# Patient Record
Sex: Female | Born: 1975
Health system: Southern US, Community
[De-identification: ages and names within clinical notes are randomized; demographics above are authoritative.]

## PROBLEM LIST (undated history)

## (undated) DIAGNOSIS — E282 Polycystic ovarian syndrome: Secondary | ICD-10-CM

## (undated) DIAGNOSIS — F32A Depression, unspecified: Secondary | ICD-10-CM

## (undated) DIAGNOSIS — I10 Essential (primary) hypertension: Secondary | ICD-10-CM

## (undated) DIAGNOSIS — M1990 Unspecified osteoarthritis, unspecified site: Secondary | ICD-10-CM

## (undated) DIAGNOSIS — D259 Leiomyoma of uterus, unspecified: Secondary | ICD-10-CM

## (undated) DIAGNOSIS — F419 Anxiety disorder, unspecified: Secondary | ICD-10-CM

## (undated) DIAGNOSIS — F329 Major depressive disorder, single episode, unspecified: Secondary | ICD-10-CM

## (undated) DIAGNOSIS — Z5189 Encounter for other specified aftercare: Secondary | ICD-10-CM

## (undated) DIAGNOSIS — Z87442 Personal history of urinary calculi: Secondary | ICD-10-CM

## (undated) DIAGNOSIS — E785 Hyperlipidemia, unspecified: Secondary | ICD-10-CM

## (undated) DIAGNOSIS — E538 Deficiency of other specified B group vitamins: Secondary | ICD-10-CM

## (undated) DIAGNOSIS — D649 Anemia, unspecified: Secondary | ICD-10-CM

## (undated) HISTORY — DX: Deficiency of other specified B group vitamins: E53.8

## (undated) HISTORY — DX: Hyperlipidemia, unspecified: E78.5

## (undated) HISTORY — DX: Anxiety disorder, unspecified: F41.9

## (undated) HISTORY — DX: Polycystic ovarian syndrome: E28.2

## (undated) HISTORY — DX: Depression, unspecified: F32.A

## (undated) HISTORY — DX: Unspecified osteoarthritis, unspecified site: M19.90

## (undated) HISTORY — DX: Encounter for other specified aftercare: Z51.89

## (undated) HISTORY — DX: Essential (primary) hypertension: I10

## (undated) HISTORY — PX: OTHER SURGICAL HISTORY: SHX169

## (undated) HISTORY — PX: CHOLECYSTECTOMY: SHX55

---
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
## 1998-02-02 ENCOUNTER — Emergency Department (HOSPITAL_COMMUNITY): Admission: EM | Admit: 1998-02-02 | Discharge: 1998-02-02 | Payer: Self-pay | Admitting: Emergency Medicine

## 1998-09-19 ENCOUNTER — Other Ambulatory Visit: Admission: RE | Admit: 1998-09-19 | Discharge: 1998-09-19 | Payer: Self-pay | Admitting: Obstetrics and Gynecology

## 1998-09-28 ENCOUNTER — Emergency Department (HOSPITAL_COMMUNITY): Admission: EM | Admit: 1998-09-28 | Discharge: 1998-09-28 | Payer: Self-pay | Admitting: Emergency Medicine

## 2001-01-05 ENCOUNTER — Encounter: Payer: Self-pay | Admitting: Emergency Medicine

## 2001-01-05 ENCOUNTER — Emergency Department (HOSPITAL_COMMUNITY): Admission: EM | Admit: 2001-01-05 | Discharge: 2001-01-05 | Payer: Self-pay | Admitting: Emergency Medicine

## 2001-02-09 ENCOUNTER — Other Ambulatory Visit: Admission: RE | Admit: 2001-02-09 | Discharge: 2001-02-09 | Payer: Self-pay | Admitting: Obstetrics and Gynecology

## 2003-04-01 ENCOUNTER — Emergency Department (HOSPITAL_COMMUNITY): Admission: AD | Admit: 2003-04-01 | Discharge: 2003-04-01 | Payer: Self-pay | Admitting: Family Medicine

## 2003-06-15 ENCOUNTER — Other Ambulatory Visit: Admission: RE | Admit: 2003-06-15 | Discharge: 2003-06-15 | Payer: Self-pay | Admitting: Obstetrics and Gynecology

## 2004-02-08 ENCOUNTER — Ambulatory Visit: Payer: Self-pay | Admitting: Family Medicine

## 2004-11-19 ENCOUNTER — Ambulatory Visit: Payer: Self-pay | Admitting: Family Medicine

## 2004-12-03 ENCOUNTER — Ambulatory Visit: Payer: Self-pay | Admitting: Family Medicine

## 2004-12-17 ENCOUNTER — Ambulatory Visit: Payer: Self-pay | Admitting: Family Medicine

## 2005-01-24 ENCOUNTER — Other Ambulatory Visit: Admission: RE | Admit: 2005-01-24 | Discharge: 2005-01-24 | Payer: Self-pay | Admitting: Obstetrics and Gynecology

## 2005-10-14 ENCOUNTER — Emergency Department (HOSPITAL_COMMUNITY): Admission: EM | Admit: 2005-10-14 | Discharge: 2005-10-14 | Payer: Self-pay | Admitting: Emergency Medicine

## 2005-11-27 ENCOUNTER — Encounter: Admission: RE | Admit: 2005-11-27 | Discharge: 2005-11-27 | Payer: Self-pay | Admitting: Obstetrics and Gynecology

## 2006-02-09 ENCOUNTER — Inpatient Hospital Stay (HOSPITAL_COMMUNITY): Admission: AD | Admit: 2006-02-09 | Discharge: 2006-02-14 | Payer: Self-pay | Admitting: Obstetrics and Gynecology

## 2006-11-18 ENCOUNTER — Ambulatory Visit: Payer: Self-pay | Admitting: Internal Medicine

## 2006-11-18 DIAGNOSIS — R519 Headache, unspecified: Secondary | ICD-10-CM | POA: Insufficient documentation

## 2006-11-18 DIAGNOSIS — R51 Headache: Secondary | ICD-10-CM | POA: Insufficient documentation

## 2006-11-18 DIAGNOSIS — O99810 Abnormal glucose complicating pregnancy: Secondary | ICD-10-CM | POA: Insufficient documentation

## 2006-11-18 DIAGNOSIS — F411 Generalized anxiety disorder: Secondary | ICD-10-CM | POA: Insufficient documentation

## 2006-11-18 DIAGNOSIS — R5381 Other malaise: Secondary | ICD-10-CM | POA: Insufficient documentation

## 2006-11-18 DIAGNOSIS — R1013 Epigastric pain: Secondary | ICD-10-CM | POA: Insufficient documentation

## 2006-11-18 DIAGNOSIS — R5383 Other fatigue: Secondary | ICD-10-CM

## 2006-11-23 LAB — CONVERTED CEMR LAB
ALT: 21 units/L (ref 0–35)
AST: 20 units/L (ref 0–37)
Albumin: 3.4 g/dL — ABNORMAL LOW (ref 3.5–5.2)
Alkaline Phosphatase: 110 units/L (ref 39–117)
BUN: 9 mg/dL (ref 6–23)
Basophils Absolute: 0 10*3/uL (ref 0.0–0.1)
Basophils Relative: 0.2 % (ref 0.0–1.0)
Bilirubin, Direct: 0.1 mg/dL (ref 0.0–0.3)
CO2: 24 meq/L (ref 19–32)
Calcium: 9.9 mg/dL (ref 8.4–10.5)
Chloride: 105 meq/L (ref 96–112)
Creatinine, Ser: 0.6 mg/dL (ref 0.4–1.2)
Eosinophils Absolute: 0.2 10*3/uL (ref 0.0–0.6)
Eosinophils Relative: 2.7 % (ref 0.0–5.0)
GFR calc Af Amer: 148 mL/min
GFR calc non Af Amer: 122 mL/min
Glucose, Bld: 130 mg/dL — ABNORMAL HIGH (ref 70–99)
HCT: 29.4 % — ABNORMAL LOW (ref 36.0–46.0)
Hemoglobin: 9.7 g/dL — ABNORMAL LOW (ref 12.0–15.0)
Iron: 50 ug/dL (ref 42–145)
Lymphocytes Relative: 27.9 % (ref 12.0–46.0)
MCHC: 33 g/dL (ref 30.0–36.0)
MCV: 72.4 fL — ABNORMAL LOW (ref 78.0–100.0)
Monocytes Absolute: 0.1 10*3/uL — ABNORMAL LOW (ref 0.2–0.7)
Monocytes Relative: 1.6 % — ABNORMAL LOW (ref 3.0–11.0)
Neutro Abs: 4.9 10*3/uL (ref 1.4–7.7)
Neutrophils Relative %: 67.6 % (ref 43.0–77.0)
Platelets: 426 10*3/uL — ABNORMAL HIGH (ref 150–400)
Potassium: 4.5 meq/L (ref 3.5–5.1)
RBC: 4.06 M/uL (ref 3.87–5.11)
RDW: 16 % — ABNORMAL HIGH (ref 11.5–14.6)
Sodium: 139 meq/L (ref 135–145)
TSH: 1.47 microintl units/mL (ref 0.35–5.50)
Total Bilirubin: 0.3 mg/dL (ref 0.3–1.2)
Total Protein: 7.6 g/dL (ref 6.0–8.3)
WBC: 7.2 10*3/uL (ref 4.5–10.5)

## 2006-12-29 ENCOUNTER — Emergency Department (HOSPITAL_COMMUNITY): Admission: EM | Admit: 2006-12-29 | Discharge: 2006-12-29 | Payer: Self-pay | Admitting: Emergency Medicine

## 2007-02-17 ENCOUNTER — Ambulatory Visit: Payer: Self-pay | Admitting: Family Medicine

## 2007-02-17 DIAGNOSIS — R109 Unspecified abdominal pain: Secondary | ICD-10-CM | POA: Insufficient documentation

## 2007-02-17 LAB — CONVERTED CEMR LAB
Beta hcg, urine, semiquantitative: NEGATIVE
Bilirubin Urine: NEGATIVE
Blood in Urine, dipstick: NEGATIVE
Glucose, Urine, Semiquant: NEGATIVE
Ketones, urine, test strip: NEGATIVE
Nitrite: NEGATIVE
Protein, U semiquant: NEGATIVE
Specific Gravity, Urine: 1.01
Urobilinogen, UA: NEGATIVE
WBC Urine, dipstick: NEGATIVE
pH: 6

## 2007-02-28 DIAGNOSIS — Z862 Personal history of diseases of the blood and blood-forming organs and certain disorders involving the immune mechanism: Secondary | ICD-10-CM | POA: Insufficient documentation

## 2007-02-28 DIAGNOSIS — Z8639 Personal history of other endocrine, nutritional and metabolic disease: Secondary | ICD-10-CM | POA: Insufficient documentation

## 2007-03-03 ENCOUNTER — Encounter: Admission: RE | Admit: 2007-03-03 | Discharge: 2007-03-03 | Payer: Self-pay | Admitting: Family Medicine

## 2007-03-03 ENCOUNTER — Telehealth (INDEPENDENT_AMBULATORY_CARE_PROVIDER_SITE_OTHER): Payer: Self-pay | Admitting: *Deleted

## 2007-03-04 ENCOUNTER — Encounter (INDEPENDENT_AMBULATORY_CARE_PROVIDER_SITE_OTHER): Payer: Self-pay | Admitting: *Deleted

## 2007-03-04 ENCOUNTER — Telehealth (INDEPENDENT_AMBULATORY_CARE_PROVIDER_SITE_OTHER): Payer: Self-pay | Admitting: *Deleted

## 2007-03-04 LAB — CONVERTED CEMR LAB
ALT: 79 units/L — ABNORMAL HIGH (ref 0–35)
AST: 19 units/L (ref 0–37)
Albumin: 3.7 g/dL (ref 3.5–5.2)
Alkaline Phosphatase: 160 units/L — ABNORMAL HIGH (ref 39–117)
Amylase: 57 units/L (ref 27–131)
BUN: 5 mg/dL — ABNORMAL LOW (ref 6–23)
Basophils Absolute: 0 10*3/uL (ref 0.0–0.1)
Basophils Relative: 1 % (ref 0.0–1.0)
Bilirubin, Direct: 0.1 mg/dL (ref 0.0–0.3)
CO2: 26 meq/L (ref 19–32)
Calcium: 9.6 mg/dL (ref 8.4–10.5)
Chloride: 105 meq/L (ref 96–112)
Creatinine, Ser: 0.6 mg/dL (ref 0.4–1.2)
Eosinophils Absolute: 0.1 10*3/uL (ref 0.0–0.6)
Eosinophils Relative: 3 % (ref 0.0–5.0)
GFR calc Af Amer: 148 mL/min
GFR calc non Af Amer: 122 mL/min
Glucose, Bld: 99 mg/dL (ref 70–99)
H Pylori IgG: NEGATIVE
HCT: 31.3 % — ABNORMAL LOW (ref 36.0–46.0)
Hemoglobin: 9.8 g/dL — ABNORMAL LOW (ref 12.0–15.0)
Lipase: 38 units/L (ref 11.0–59.0)
Lymphocytes Relative: 62.4 % — ABNORMAL HIGH (ref 12.0–46.0)
MCHC: 31.4 g/dL (ref 30.0–36.0)
MCV: 74.5 fL — ABNORMAL LOW (ref 78.0–100.0)
Monocytes Absolute: 0.6 10*3/uL (ref 0.2–0.7)
Monocytes Relative: 16.1 % — ABNORMAL HIGH (ref 3.0–11.0)
Neutro Abs: 0.6 10*3/uL — ABNORMAL LOW (ref 1.4–7.7)
Neutrophils Relative %: 17.5 % — ABNORMAL LOW (ref 43.0–77.0)
Platelets: 328 10*3/uL (ref 150–400)
Potassium: 3.9 meq/L (ref 3.5–5.1)
RBC: 4.2 M/uL (ref 3.87–5.11)
RDW: 19.3 % — ABNORMAL HIGH (ref 11.5–14.6)
Sodium: 139 meq/L (ref 135–145)
Total Bilirubin: 0.6 mg/dL (ref 0.3–1.2)
Total Protein: 7.5 g/dL (ref 6.0–8.3)
WBC: 3.7 10*3/uL — ABNORMAL LOW (ref 4.5–10.5)

## 2007-03-17 ENCOUNTER — Ambulatory Visit: Payer: Self-pay | Admitting: Family Medicine

## 2007-03-18 ENCOUNTER — Telehealth (INDEPENDENT_AMBULATORY_CARE_PROVIDER_SITE_OTHER): Payer: Self-pay | Admitting: *Deleted

## 2007-03-18 ENCOUNTER — Emergency Department (HOSPITAL_COMMUNITY): Admission: EM | Admit: 2007-03-18 | Discharge: 2007-03-19 | Payer: Self-pay | Admitting: Emergency Medicine

## 2007-03-18 LAB — CONVERTED CEMR LAB
ALT: 20 units/L (ref 0–35)
AST: 15 units/L (ref 0–37)
Albumin: 3.4 g/dL — ABNORMAL LOW (ref 3.5–5.2)
Alkaline Phosphatase: 126 units/L — ABNORMAL HIGH (ref 39–117)
Basophils Absolute: 0.1 10*3/uL (ref 0.0–0.1)
Basophils Relative: 1 % (ref 0.0–1.0)
Bilirubin, Direct: 0.1 mg/dL (ref 0.0–0.3)
Eosinophils Absolute: 0.3 10*3/uL (ref 0.0–0.6)
Eosinophils Relative: 4.5 % (ref 0.0–5.0)
GGT: 205 units/L — ABNORMAL HIGH (ref 7–51)
HCT: 31.8 % — ABNORMAL LOW (ref 36.0–46.0)
HCV Ab: NEGATIVE
Hemoglobin: 10.1 g/dL — ABNORMAL LOW (ref 12.0–15.0)
Hep A IgM: NEGATIVE
Hep B C IgM: NEGATIVE
Hepatitis B Surface Ag: NEGATIVE
Lymphocytes Relative: 52.8 % — ABNORMAL HIGH (ref 12.0–46.0)
MCHC: 31.9 g/dL (ref 30.0–36.0)
MCV: 74.9 fL — ABNORMAL LOW (ref 78.0–100.0)
Monocytes Absolute: 1.4 10*3/uL — ABNORMAL HIGH (ref 0.2–0.7)
Monocytes Relative: 20.1 % — ABNORMAL HIGH (ref 3.0–11.0)
Neutro Abs: 1.5 10*3/uL (ref 1.4–7.7)
Neutrophils Relative %: 21.6 % — ABNORMAL LOW (ref 43.0–77.0)
Platelets: 366 10*3/uL (ref 150–400)
RBC: 4.25 M/uL (ref 3.87–5.11)
RDW: 17.2 % — ABNORMAL HIGH (ref 11.5–14.6)
Total Bilirubin: 0.3 mg/dL (ref 0.3–1.2)
Total Protein: 7.4 g/dL (ref 6.0–8.3)
WBC: 6.9 10*3/uL (ref 4.5–10.5)

## 2007-03-26 ENCOUNTER — Encounter: Payer: Self-pay | Admitting: Family Medicine

## 2007-11-11 ENCOUNTER — Emergency Department (HOSPITAL_COMMUNITY): Admission: EM | Admit: 2007-11-11 | Discharge: 2007-11-11 | Payer: Self-pay | Admitting: Family Medicine

## 2008-03-29 ENCOUNTER — Ambulatory Visit: Payer: Self-pay | Admitting: Family Medicine

## 2008-03-29 DIAGNOSIS — R81 Glycosuria: Secondary | ICD-10-CM | POA: Insufficient documentation

## 2008-03-29 LAB — CONVERTED CEMR LAB
ALT: 27 units/L (ref 0–35)
Albumin: 3.6 g/dL (ref 3.5–5.2)
BUN: 8 mg/dL (ref 6–23)
Basophils Relative: 0.2 % (ref 0.0–3.0)
Bilirubin Urine: NEGATIVE
Chloride: 104 meq/L (ref 96–112)
Cholesterol: 155 mg/dL (ref 0–200)
Eosinophils Relative: 1.9 % (ref 0.0–5.0)
Glucose, Urine, Semiquant: NEGATIVE
HCT: 30.3 % — ABNORMAL LOW (ref 36.0–46.0)
Hemoglobin: 9.5 g/dL — ABNORMAL LOW (ref 12.0–15.0)
Hgb A1c MFr Bld: 8.6 % — ABNORMAL HIGH (ref 4.6–6.5)
LDL Cholesterol: 89 mg/dL (ref 0–99)
Lymphs Abs: 2.3 10*3/uL (ref 0.7–4.0)
MCV: 71.7 fL — ABNORMAL LOW (ref 78.0–100.0)
Monocytes Absolute: 0.4 10*3/uL (ref 0.1–1.0)
Neutro Abs: 3.7 10*3/uL (ref 1.4–7.7)
Platelets: 346 10*3/uL (ref 150.0–400.0)
Potassium: 3.8 meq/L (ref 3.5–5.1)
TSH: 1.89 microintl units/mL (ref 0.35–5.50)
Total Bilirubin: 0.5 mg/dL (ref 0.3–1.2)
Total Protein: 7.8 g/dL (ref 6.0–8.3)
Urobilinogen, UA: NEGATIVE
WBC: 6.5 10*3/uL (ref 4.5–10.5)
pH: 6.5

## 2008-03-30 ENCOUNTER — Telehealth (INDEPENDENT_AMBULATORY_CARE_PROVIDER_SITE_OTHER): Payer: Self-pay | Admitting: *Deleted

## 2008-03-31 ENCOUNTER — Ambulatory Visit: Payer: Self-pay | Admitting: Family Medicine

## 2008-03-31 DIAGNOSIS — E1165 Type 2 diabetes mellitus with hyperglycemia: Secondary | ICD-10-CM | POA: Insufficient documentation

## 2008-03-31 DIAGNOSIS — IMO0002 Reserved for concepts with insufficient information to code with codable children: Secondary | ICD-10-CM | POA: Insufficient documentation

## 2008-03-31 DIAGNOSIS — E118 Type 2 diabetes mellitus with unspecified complications: Secondary | ICD-10-CM

## 2008-03-31 DIAGNOSIS — D649 Anemia, unspecified: Secondary | ICD-10-CM | POA: Insufficient documentation

## 2008-05-05 ENCOUNTER — Telehealth: Payer: Self-pay | Admitting: Family Medicine

## 2008-07-28 ENCOUNTER — Ambulatory Visit: Payer: Self-pay | Admitting: Family Medicine

## 2008-07-28 LAB — CONVERTED CEMR LAB
Nitrite: NEGATIVE
Protein, U semiquant: 30
Urobilinogen, UA: NEGATIVE
WBC Urine, dipstick: NEGATIVE

## 2008-08-02 ENCOUNTER — Ambulatory Visit: Payer: Self-pay | Admitting: Family Medicine

## 2008-08-08 ENCOUNTER — Encounter (INDEPENDENT_AMBULATORY_CARE_PROVIDER_SITE_OTHER): Payer: Self-pay | Admitting: *Deleted

## 2008-08-08 ENCOUNTER — Ambulatory Visit: Payer: Self-pay | Admitting: Family Medicine

## 2008-08-09 ENCOUNTER — Encounter: Payer: Self-pay | Admitting: Family Medicine

## 2008-08-11 ENCOUNTER — Encounter: Payer: Self-pay | Admitting: Family Medicine

## 2008-08-13 LAB — CONVERTED CEMR LAB
BUN: 11 mg/dL (ref 6–23)
Bilirubin, Direct: 0.1 mg/dL (ref 0.0–0.3)
Calcium: 9.4 mg/dL (ref 8.4–10.5)
Cholesterol: 175 mg/dL (ref 0–200)
Creatinine, Ser: 0.4 mg/dL (ref 0.4–1.2)
HDL: 38.3 mg/dL — ABNORMAL LOW (ref >39.00)
Hgb A1c MFr Bld: 7.8 % — ABNORMAL HIGH (ref 4.6–6.5)
LDL Cholesterol: 106 mg/dL — ABNORMAL HIGH (ref 0–99)
Total Bilirubin: 0.6 mg/dL (ref 0.3–1.2)
Total CHOL/HDL Ratio: 5
Total Protein: 7.7 g/dL (ref 6.0–8.3)
Triglycerides: 156 mg/dL — ABNORMAL HIGH (ref 0.0–149.0)

## 2008-08-16 ENCOUNTER — Telehealth (INDEPENDENT_AMBULATORY_CARE_PROVIDER_SITE_OTHER): Payer: Self-pay | Admitting: *Deleted

## 2008-08-23 ENCOUNTER — Encounter (INDEPENDENT_AMBULATORY_CARE_PROVIDER_SITE_OTHER): Payer: Self-pay | Admitting: *Deleted

## 2008-08-28 ENCOUNTER — Telehealth (INDEPENDENT_AMBULATORY_CARE_PROVIDER_SITE_OTHER): Payer: Self-pay | Admitting: *Deleted

## 2008-09-15 ENCOUNTER — Ambulatory Visit: Payer: Self-pay | Admitting: Family Medicine

## 2008-10-16 ENCOUNTER — Telehealth (INDEPENDENT_AMBULATORY_CARE_PROVIDER_SITE_OTHER): Payer: Self-pay | Admitting: *Deleted

## 2008-12-19 ENCOUNTER — Encounter: Payer: Self-pay | Admitting: Family Medicine

## 2008-12-19 ENCOUNTER — Telehealth (INDEPENDENT_AMBULATORY_CARE_PROVIDER_SITE_OTHER): Payer: Self-pay | Admitting: *Deleted

## 2009-01-22 ENCOUNTER — Ambulatory Visit: Payer: Self-pay | Admitting: Family Medicine

## 2009-01-29 ENCOUNTER — Ambulatory Visit: Payer: Self-pay | Admitting: Family Medicine

## 2009-02-06 ENCOUNTER — Telehealth (INDEPENDENT_AMBULATORY_CARE_PROVIDER_SITE_OTHER): Payer: Self-pay | Admitting: *Deleted

## 2009-02-06 LAB — CONVERTED CEMR LAB
ALT: 23 units/L (ref 0–35)
Albumin: 4 g/dL (ref 3.5–5.2)
Alkaline Phosphatase: 108 units/L (ref 39–117)
BUN: 9 mg/dL (ref 6–23)
Bilirubin, Direct: 0 mg/dL (ref 0.0–0.3)
Calcium: 10.2 mg/dL (ref 8.4–10.5)
Chloride: 105 meq/L (ref 96–112)
Cholesterol: 191 mg/dL (ref 0–200)
Creatinine, Ser: 0.7 mg/dL (ref 0.4–1.2)
GFR calc non Af Amer: 121.94 mL/min (ref >60)
Hgb A1c MFr Bld: 6.9 % — ABNORMAL HIGH (ref 4.6–6.5)
LDL Cholesterol: 133 mg/dL — ABNORMAL HIGH (ref 0–99)
Microalb, Ur: 1.8 mg/dL (ref 0.0–1.9)
Total Protein: 8.2 g/dL (ref 6.0–8.3)
Triglycerides: 73 mg/dL (ref 0.0–149.0)

## 2009-05-16 ENCOUNTER — Telehealth (INDEPENDENT_AMBULATORY_CARE_PROVIDER_SITE_OTHER): Payer: Self-pay | Admitting: *Deleted

## 2009-07-30 ENCOUNTER — Telehealth: Payer: Self-pay | Admitting: Family Medicine

## 2009-09-16 ENCOUNTER — Emergency Department (HOSPITAL_BASED_OUTPATIENT_CLINIC_OR_DEPARTMENT_OTHER): Admission: EM | Admit: 2009-09-16 | Discharge: 2009-09-16 | Payer: Self-pay | Admitting: Emergency Medicine

## 2009-11-05 ENCOUNTER — Telehealth (INDEPENDENT_AMBULATORY_CARE_PROVIDER_SITE_OTHER): Payer: Self-pay | Admitting: *Deleted

## 2009-11-13 ENCOUNTER — Ambulatory Visit: Payer: Self-pay | Admitting: Family Medicine

## 2009-11-13 DIAGNOSIS — N898 Other specified noninflammatory disorders of vagina: Secondary | ICD-10-CM | POA: Insufficient documentation

## 2009-11-13 DIAGNOSIS — R350 Frequency of micturition: Secondary | ICD-10-CM | POA: Insufficient documentation

## 2009-11-13 LAB — CONVERTED CEMR LAB
Blood in Urine, dipstick: NEGATIVE
Glucose, Urine, Semiquant: >=1000
Nitrite: NEGATIVE
Protein, U semiquant: 100
Urobilinogen, UA: 0.2
WBC Urine, dipstick: NEGATIVE
pH: 5

## 2009-11-14 ENCOUNTER — Emergency Department (HOSPITAL_COMMUNITY)
Admission: EM | Admit: 2009-11-14 | Discharge: 2009-11-14 | Payer: Self-pay | Source: Home / Self Care | Admitting: Emergency Medicine

## 2009-11-15 ENCOUNTER — Encounter (INDEPENDENT_AMBULATORY_CARE_PROVIDER_SITE_OTHER): Payer: Self-pay | Admitting: *Deleted

## 2009-12-25 ENCOUNTER — Ambulatory Visit: Payer: Self-pay | Admitting: Family Medicine

## 2010-02-03 ENCOUNTER — Encounter: Payer: Self-pay | Admitting: Family Medicine

## 2010-02-14 ENCOUNTER — Encounter: Payer: Self-pay | Admitting: Family Medicine

## 2010-02-14 ENCOUNTER — Other Ambulatory Visit (HOSPITAL_COMMUNITY)
Admission: RE | Admit: 2010-02-14 | Discharge: 2010-02-14 | Disposition: A | Discharge status: Home / Self Care | Payer: Commercial Managed Care - PPO | Source: Ambulatory Visit | Attending: Family Medicine | Admitting: Family Medicine

## 2010-02-14 ENCOUNTER — Other Ambulatory Visit: Payer: Self-pay | Admitting: Family Medicine

## 2010-02-14 ENCOUNTER — Encounter (INDEPENDENT_AMBULATORY_CARE_PROVIDER_SITE_OTHER): Payer: Commercial Managed Care - PPO | Admitting: Family Medicine

## 2010-02-14 DIAGNOSIS — Z23 Encounter for immunization: Secondary | ICD-10-CM

## 2010-02-14 DIAGNOSIS — Z0000 Encounter for general adult medical examination without abnormal findings: Secondary | ICD-10-CM

## 2010-02-14 DIAGNOSIS — D649 Anemia, unspecified: Secondary | ICD-10-CM

## 2010-02-14 DIAGNOSIS — Z136 Encounter for screening for cardiovascular disorders: Secondary | ICD-10-CM

## 2010-02-14 DIAGNOSIS — Z833 Family history of diabetes mellitus: Secondary | ICD-10-CM

## 2010-02-14 DIAGNOSIS — E119 Type 2 diabetes mellitus without complications: Secondary | ICD-10-CM

## 2010-02-14 DIAGNOSIS — Z01419 Encounter for gynecological examination (general) (routine) without abnormal findings: Secondary | ICD-10-CM | POA: Insufficient documentation

## 2010-02-14 LAB — MICROALBUMIN / CREATININE URINE RATIO
Microalb Creat Ratio: 1.1 mg/g (ref 0.0–30.0)
Microalb, Ur: 3.4 mg/dL — ABNORMAL HIGH (ref 0.0–1.9)

## 2010-02-14 LAB — HEPATIC FUNCTION PANEL
ALT: 35 U/L (ref 0–35)
Bilirubin, Direct: 0.1 mg/dL (ref 0.0–0.3)
Total Bilirubin: 0.2 mg/dL — ABNORMAL LOW (ref 0.3–1.2)

## 2010-02-14 LAB — CBC WITH DIFFERENTIAL/PLATELET
Eosinophils Absolute: 0.1 10*3/uL (ref 0.0–0.7)
Eosinophils Relative: 1.4 % (ref 0.0–5.0)
HCT: 29.8 % — ABNORMAL LOW (ref 36.0–46.0)
Lymphs Abs: 2.7 10*3/uL (ref 0.7–4.0)
MCHC: 31.6 g/dL (ref 30.0–36.0)
MCV: 71.7 fl — ABNORMAL LOW (ref 78.0–100.0)
Monocytes Absolute: 0.5 10*3/uL (ref 0.1–1.0)
Neutrophils Relative %: 50.3 % (ref 43.0–77.0)
Platelets: 418 10*3/uL — ABNORMAL HIGH (ref 150.0–400.0)
RDW: 19.8 % — ABNORMAL HIGH (ref 11.5–14.6)

## 2010-02-14 LAB — BASIC METABOLIC PANEL
BUN: 9 mg/dL (ref 6–23)
CO2: 25 mEq/L (ref 19–32)
Chloride: 102 mEq/L (ref 96–112)
Creatinine, Ser: 0.6 mg/dL (ref 0.4–1.2)
Glucose, Bld: 138 mg/dL — ABNORMAL HIGH (ref 70–99)
Potassium: 3.9 mEq/L (ref 3.5–5.1)

## 2010-02-14 LAB — CONVERTED CEMR LAB
Blood in Urine, dipstick: NEGATIVE
Glucose, Urine, Semiquant: NEGATIVE
Nitrite: NEGATIVE
Protein, U semiquant: 30
Specific Gravity, Urine: 1.025
WBC Urine, dipstick: NEGATIVE

## 2010-02-14 LAB — HEMOGLOBIN A1C: Hgb A1c MFr Bld: 7.5 % — ABNORMAL HIGH (ref 4.6–6.5)

## 2010-02-14 LAB — LIPID PANEL
Cholesterol: 194 mg/dL (ref 0–200)
LDL Cholesterol: 140 mg/dL — ABNORMAL HIGH (ref 0–99)
Triglycerides: 88 mg/dL (ref 0.0–149.0)

## 2010-02-14 LAB — TSH: TSH: 1.07 u[IU]/mL (ref 0.35–5.50)

## 2010-02-14 NOTE — Assessment & Plan Note (Signed)
Summary: vaginal irritation/pressure /cbs   Vital Signs:  Patient profile:   35 year old female Height:      67.5 inches Weight:      221.2 pounds BMI:     34.26 Temp:     98.1 degrees F oral Pulse rate:   68 / minute Pulse rhythm:   regular BP sitting:   130 / 80  (left arm) Cuff size:   regular  Vitals Entered By: Almeta Monas CMA Duncan Dull) (November 13, 2009 3:42 PM) CC: c/o vaginal irritation, urgency and pressure when urinating   History of Present Illness: Pt here c/o vaginal irritation --no discharge.  + urinary frequency  Current Medications (verified): 1)  Bcp 2)  Freestyle Unistick Ii Lancets  Misc (Lancets) .... Use As Directed Twice Daily. 3)  Freestyle Lite Test  Strp (Glucose Blood) .... Use As Directed Before Breakfast and 2-2 1/2 Hours After A Meal. 4)  Chromagen Fa 70-150-2-1 Mg Tabs (Fe Asp Gly-Succ-C-Thre-B12-Fa) .Marland Kitchen.. 1 By Mouth Once Daily 5)  Crestor 20 Mg Tabs (Rosuvastatin Calcium) .Marland Kitchen.. 1 By Mouth At Bedtime 6)  Victoza 18 Mg/42ml Soln (Liraglutide) .... 1.8 Mg Subcutaneously Once Daily **labs Due Now** 7)  Fenofibrate 160 Mg Tabs (Fenofibrate) .... Take 1 By Mouth  Once Daily 8)  Diflucan 150 Mg Tabs (Fluconazole) .... Take 1 By Mouth X1  May Repeat in 3 Days 9)  Novofine 32g X 6 Mm Misc (Insulin Pen Needle) .... Use As Directed 10)  Cymbalta 60 Mg Cpep (Duloxetine Hcl) .Marland Kitchen.. 1 By Mouth Once Daily  Allergies (verified): 1)  ! Sulfa  Past History:  Past Medical History: Last updated: 03/31/2008 G1P1, Csection 1--2008 Anxiety Diabetes mellitus, type II  Family History: Last updated: 03/31/2008 migraines -- no Family History Diabetes 1st degree relative--- both parents and brother  Social History: Last updated: 11/18/2006 Single lives w/ hermother and her baby  Risk Factors: Alcohol Use: 0 (03/29/2008) Caffeine Use: rare (03/29/2008) Diet: healthy (07/28/2008) Exercise: yes (07/28/2008)  Risk Factors: Smoking Status: never  (03/29/2008) Passive Smoke Exposure: yes (03/29/2008)  Family History: Reviewed history from 03/31/2008 and no changes required. migraines -- no Family History Diabetes 1st degree relative--- both parents and brother  Social History: Reviewed history from 11/18/2006 and no changes required. Single lives w/ hermother and her baby  Review of Systems      See HPI  Physical Exam  General:  Well-developed,well-nourished,in no acute distress; alert,appropriate and cooperative throughout examination Abdomen:  Bowel sounds positive,abdomen soft and non-tender without masses, organomegaly or hernias noted. Genitalia:  normal introitus, no external lesions, and vaginal discharge.   Psych:  Oriented X3 and normally interactive.     Impression & Recommendations:  Problem # 1:  VAGINAL DISCHARGE (ICD-623.5)  prob yeast---diflucan sent to pharmacy Orders: TLB-Wet Mount / Fungus (87210-WPREP) UA Dipstick w/o Micro (manual) (47829)  Discussed medication use and symptom relief.   Problem # 2:  FREQUENCY, URINARY (ICD-788.41)  prob secondary to elevated BS--pt has been out of Victoza secondary to mix up with pharmacy Orders: UA Dipstick w/o Micro (manual) (56213)  Discussed use of medication.   Problem # 3:  DIABETES MELLITUS, TYPE II (ICD-250.00)  Her updated medication list for this problem includes:    Victoza 18 Mg/40ml Soln (Liraglutide) .Marland Kitchen... 1.8 mg subcutaneously once daily **labs due now**  Labs Reviewed: Creat: 0.7 (01/29/2009)     Last Eye Exam: normal (11/29/2008) Reviewed HgBA1c results: 6.9 (01/29/2009)  7.8 (08/02/2008)  Complete Medication List: 1)  Bcp  2)  Freestyle Unistick Ii Lancets Misc (Lancets) .... Use as directed twice daily. 3)  Freestyle Lite Test Strp (Glucose blood) .... Use as directed before breakfast and 2-2 1/2 hours after a meal. 4)  Chromagen Fa 70-150-2-1 Mg Tabs (Fe asp gly-succ-c-thre-b12-fa) .Marland Kitchen.. 1 by mouth once daily 5)  Crestor 20 Mg  Tabs (Rosuvastatin calcium) .Marland Kitchen.. 1 by mouth at bedtime 6)  Victoza 18 Mg/6ml Soln (Liraglutide) .... 1.8 mg subcutaneously once daily **labs due now** 7)  Fenofibrate 160 Mg Tabs (Fenofibrate) .... Take 1 by mouth  once daily 8)  Diflucan 150 Mg Tabs (Fluconazole) .... Take 1 by mouth x1  may repeat in 3 days 9)  Novofine 32g X 6 Mm Misc (Insulin pen needle) .... Use as directed 10)  Cymbalta 60 Mg Cpep (Duloxetine hcl) .Marland Kitchen.. 1 by mouth once daily Prescriptions: VICTOZA 18 MG/3ML SOLN (LIRAGLUTIDE) 1.8 mg Subcutaneously once daily **LABS DUE NOW**  #3 months x 3   Entered and Authorized by:   Loreen Freud DO   Signed by:   Loreen Freud DO on 11/13/2009   Method used:   Electronically to        [facility]. RX* (retail)       [address]       [place], [place]  [address]       Ph: [phone]       Fax: [phone]   RxID:   [id number] DIFLUCAN 150 MG TABS (FLUCONAZOLE) Take 1 by mouth x1  may repeat in 3 days  #2 x 2   Entered and Authorized by:   Loreen Freud DO   Signed by:   Loreen Freud DO on 11/13/2009   Method used:   Electronically to        [facility]. RX* (retail)       [address]       [place], [place]  [address]       Ph: [phone]       Fax: [phone]   RxID:   [id number]    Orders Added: 1)  TLB-Wet Mount / Fungus [87210-WPREP] 2)  Est. Patient Level IV [63016] 3)  UA Dipstick w/o Micro (manual) [81002]     Laboratory Results   Urine Tests    Routine Urinalysis   Color: yellow Appearance: Hazy Glucose: >=1000   (Normal Range: Negative) Bilirubin: negative   (Normal Range: Negative) Ketone: negative   (Normal Range: Negative) Spec. Gravity: 1.020   (Normal Range: 1.003-1.035) Blood: negative   (Normal Range: Negative) pH: 5.0   (Normal Range: 5.0-8.0) Protein: 100   (Normal Range: Negative) Urobilinogen: 0.2   (Normal Range: 0-1) Nitrite: negative    (Normal Range: Negative) Leukocyte Esterace: negative   (Normal Range: Negative)

## 2010-02-14 NOTE — Progress Notes (Signed)
Summary: pt needs cpx - lab order in this note  ---- Converted from flag ---- ---- 11/05/2009 1:58 PM, Okey Regal Spring wrote: patient didnt return call - letter mailed  ---- 10/25/2009 4:10 PM, Okey Regal Spring wrote:  lmom for patient to call & schedule dfasting lab & cpx   ---- 10/25/2009 10:00 AM, Almeta Monas CMA (AAMA) wrote: Can you please schedule this pt a cpx and fasting labs 272.4  250.00  bmp, hep, lipid, hgba1c, microalbumin ------------------------------

## 2010-02-14 NOTE — Progress Notes (Signed)
Summary: yeast infection  Phone Note Call from Patient Call back at Home Phone [phone]   Caller: Patient Summary of Call: pt c/o yeast infection and would like a rx called in. pt last OV 01-22-09. pt uses [facility]. Pls advise.............Marland KitchenFelecia Deloach CMA  May 16, 2009 10:51 AM   Follow-up for Phone Call        diflucan 150 mg  #2  1 by mouth x1  may repeat in 3 days as needed --- ov if it does not clear Follow-up by: Loreen Freud DO,  May 16, 2009 11:31 AM  Additional Follow-up for Phone Call Additional follow up Details #1::        left pt detail message.............Marland KitchenFelecia Deloach CMA  May 16, 2009 12:04 PM     New/Updated Medications: DIFLUCAN 150 MG TABS (FLUCONAZOLE) Take 1 by mouth x1  may repeat in 3 days Prescriptions: DIFLUCAN 150 MG TABS (FLUCONAZOLE) Take 1 by mouth x1  may repeat in 3 days  #2 x 0   Entered by:   Jeremy Johann CMA   Authorized by:   Loreen Freud DO   Signed by:   Jeremy Johann CMA on 05/16/2009   Method used:   Faxed to ...       [facility]. RX* (retail)       [address]       [place], [place]  [address]       Ph: [phone]       Fax: [phone]   RxID:   [id number]

## 2010-02-14 NOTE — Letter (Signed)
Summary: Generic Letter  [facility]  [address] [place], [place] [address]   Phone: [phone]  Fax: [phone]    11/15/2009  EVANN KOELZER [address] [place], [place]  [address]  Dear Ms. Sitzmann,   We have tried to contact you several times but have been unsuccessful. Please contact our office at your earliest convenience to update your contact information.        Sincerely,        Loreen Freud, DO

## 2010-02-14 NOTE — Assessment & Plan Note (Signed)
Summary: NEEDS MED REFILL--WILL BE OUT SOON///SPH   Vital Signs:  Patient profile:   35 year old female Height:      67.5 inches Weight:      212 pounds BMI:     32.83 Temp:     98.1 degrees F oral Pulse rate:   76 / minute Pulse rhythm:   regular BP sitting:   120 / 76  (left arm) Cuff size:   regular  Vitals Entered By: Army Fossa CMA (January 22, 2009 3:55 PM) CC: refill meds- med list updated.    History of Present Illness:  Type 1 diabetes mellitus follow-up      This is a 35 year old woman who presents with Type 2 diabetes mellitus follow-up.  The patient denies polyuria, polydipsia, blurred vision, self managed hypoglycemia, hypoglycemia requiring help, weight loss, weight gain, and numbness of extremities.  The patient denies the following symptoms: neuropathic pain, chest pain, vomiting, orthostatic symptoms, poor wound healing, intermittent claudication, vision loss, and foot ulcer.  Since the last visit the patient reports poor dietary compliance, compliance with medications, not exercising regularly, and monitoring blood glucose.  The patient has been measuring capillary blood glucose before breakfast.  Since the last visit, the patient reports having had eye care by an ophthalmologist.    Current Medications (verified): 1)  Bcp 2)  Freestyle Unistick Ii Lancets  Misc (Lancets) .... Use As Directed Twice Daily. 3)  Freestyle Lite Test  Strp (Glucose Blood) .... Use As Directed Before Breakfast and 2-2 1/2 Hours After A Meal. 4)  Chromagen Fa 70-150-2-1 Mg Tabs (Fe Asp Gly-Succ-C-Thre-B12-Fa) .Marland Kitchen.. 1 By Mouth Once Daily 5)  Zocor 40 Mg Tabs (Simvastatin) .Marland Kitchen.. 1 By Mouth At Bedtime 6)  Victoza 18 Mg/80ml Soln (Liraglutide) .... 1.8 Mg Subcutaneously Once Daily 7)  Fenofibrate 160 Mg Tabs (Fenofibrate) .... Take 1 By Mouth  Once Daily 8)  Fluconazole 150 Mg Tabs (Fluconazole) .Marland Kitchen.. 1 By Mouth X1.  May Repeat in 1 Week As Needed 9)  Novofine 32g X 6 Mm Misc (Insulin Pen  Needle) .... Use As Directed 10)  Cymbalta 60 Mg Cpep (Duloxetine Hcl) .Marland Kitchen.. 1 By Mouth Once Daily  Allergies (verified): 1)  ! Sulfa  Past History:  Past Medical History: Last updated: 03/31/2008 G1P1, Csection 1--2008 Anxiety Diabetes mellitus, type II  Family History: Last updated: 03/31/2008 migraines -- no Family History Diabetes 1st degree relative--- both parents and brother  Social History: Last updated: 11/18/2006 Single lives w/ hermother and her baby  Risk Factors: Alcohol Use: 0 (03/29/2008) Caffeine Use: rare (03/29/2008) Diet: healthy (07/28/2008) Exercise: yes (07/28/2008)  Risk Factors: Smoking Status: never (03/29/2008) Passive Smoke Exposure: yes (03/29/2008)  Family History: Reviewed history from 03/31/2008 and no changes required. migraines -- no Family History Diabetes 1st degree relative--- both parents and brother  Social History: Reviewed history from 11/18/2006 and no changes required. Single lives w/ hermother and her baby  Review of Systems      See HPI  Physical Exam  General:  Well-developed,well-nourished,in no acute distress; alert,appropriate and cooperative throughout examination Lungs:  Normal respiratory effort, chest expands symmetrically. Lungs are clear to auscultation, no crackles or wheezes. Heart:  normal rate and no murmur.   Extremities:  No clubbing, cyanosis, edema, or deformity noted with normal full range of motion of all joints.    Diabetes Management Exam:    Foot Exam (with socks and/or shoes not present):       Sensory-Pinprick/Light touch:  Left medial foot (L-4): normal          Left dorsal foot (L-5): normal          Left lateral foot (S-1): normal          Right medial foot (L-4): normal          Right dorsal foot (L-5): normal          Right lateral foot (S-1): normal       Sensory-Monofilament:          Left foot: normal          Right foot: normal       Inspection:          Left foot:  normal          Right foot: normal       Nails:          Left foot: normal          Right foot: normal    Eye Exam:       Eye Exam done elsewhere          Date: 11/29/2008          Results: normal          Done by: Pearlean Brownie   Impression & Recommendations:  Problem # 1:  UNSPECIFIED ANEMIA (ICD-285.9)  Her updated medication list for this problem includes:    Chromagen Fa 70-150-2-1 Mg Tabs (Fe asp gly-succ-c-thre-b12-fa) .Marland Kitchen... 1 by mouth once daily  Hgb: 9.5 (03/29/2008)   Hct: 30.3 (03/29/2008)   Platelets: 346.0 (03/29/2008) RBC: 4.22 (03/29/2008)   RDW: 16.8 (03/29/2008)   WBC: 6.5 (03/29/2008) MCV: 71.7 (03/29/2008)   MCHC: 31.3 (03/29/2008) Iron: 50 (11/18/2006)   TSH: 1.89 (03/29/2008)  Problem # 2:  DIABETES MELLITUS, TYPE II (ICD-250.00)  The following medications were removed from the medication list:    Glucophage 500 Mg Tabs (Metformin hcl) .Marland Kitchen... 1 by mouth once daily Her updated medication list for this problem includes:    Victoza 18 Mg/7ml Soln (Liraglutide) .Marland Kitchen... 1.8 mg subcutaneously once daily  Orders: Venipuncture ([address]) TLB-Lipid Panel (80061-LIPID) TLB-BMP (Basic Metabolic Panel-BMET) (80048-METABOL) TLB-Hepatic/Liver Function Pnl (80076-HEPATIC) TLB-A1C / Hgb A1C (Glycohemoglobin) (83036-A1C) TLB-Microalbumin/Creat Ratio, Urine (82043-MALB)  Problem # 3:  ANXIETY (ICD-300.00)  The following medications were removed from the medication list:    Cymbalta 60 Mg Cpep (Duloxetine hcl) .Marland Kitchen... 1 by mouth once daily Her updated medication list for this problem includes:    Cymbalta 60 Mg Cpep (Duloxetine hcl) .Marland Kitchen... 1 by mouth once daily  Discussed medication use and relaxation techniques.   Complete Medication List: 1)  Bcp  2)  Freestyle Unistick Ii Lancets Misc (Lancets) .... Use as directed twice daily. 3)  Freestyle Lite Test Strp (Glucose blood) .... Use as directed before breakfast and 2-2 1/2 hours after a meal. 4)  Chromagen Fa 70-150-2-1 Mg Tabs  (Fe asp gly-succ-c-thre-b12-fa) .Marland Kitchen.. 1 by mouth once daily 5)  Zocor 40 Mg Tabs (Simvastatin) .Marland Kitchen.. 1 by mouth at bedtime 6)  Victoza 18 Mg/64ml Soln (Liraglutide) .... 1.8 mg subcutaneously once daily 7)  Fenofibrate 160 Mg Tabs (Fenofibrate) .... Take 1 by mouth  once daily 8)  Fluconazole 150 Mg Tabs (Fluconazole) .Marland Kitchen.. 1 by mouth x1.  may repeat in 1 week as needed 9)  Novofine 32g X 6 Mm Misc (Insulin pen needle) .... Use as directed 10)  Cymbalta 60 Mg Cpep (Duloxetine hcl) .Marland Kitchen.. 1 by mouth once daily Prescriptions: CYMBALTA 60 MG  CPEP (DULOXETINE HCL) 1 by mouth once daily  #30 x 5   Entered and Authorized by:   Loreen Freud DO   Signed by:   Loreen Freud DO on 01/22/2009   Method used:   Print then Give to Patient   RxID:   [id number] FREESTYLE LITE TEST  STRP (GLUCOSE BLOOD) USE AS DIRECTED BEFORE BREAKFAST AND 2-2 1/2 HOURS AFTER A MEAL.  #60 x 6   Entered and Authorized by:   Loreen Freud DO   Signed by:   Loreen Freud DO on 01/22/2009   Method used:   Electronically to        [facility]. RX* (retail)       [address]       [place], [place]  [address]       Ph: [phone]       Fax: [phone]   RxID:   [id number] FREESTYLE UNISTICK II LANCETS  MISC (LANCETS) USE AS DIRECTED TWICE DAILY.  #60 x 6   Entered and Authorized by:   Loreen Freud DO   Signed by:   Loreen Freud DO on 01/22/2009   Method used:   Electronically to        [facility]. RX* (retail)       [address]       [place], [place]  [address]       Ph: [phone]       Fax: [phone]   RxID:   [id number] CHROMAGEN FA 70-150-2-1 MG TABS (FE ASP GLY-SUCC-C-THRE-B12-FA) 1 by mouth once daily  #30 x 2   Entered and Authorized by:   Loreen Freud DO   Signed by:   Loreen Freud DO on 01/22/2009   Method used:   Electronically to        [facility]. RX* (retail)       [address]        [place], [place]  [address]       Ph: [phone]       Fax: [phone]   RxID:   [id number] FENOFIBRATE 160 MG TABS (FENOFIBRATE) take 1 by mouth  once daily  #30 x 2   Entered and Authorized by:   Loreen Freud DO   Signed by:   Loreen Freud DO on 01/22/2009   Method used:   Electronically to        [facility]. RX* (retail)       [address]       [place], [place]  [address]       Ph: [phone]       Fax: [phone]   RxID:   [id number] NIDPOEU 18 MG/3ML SOLN (LIRAGLUTIDE) 1.8 mg Subcutaneously once daily  #1 month x 2   Entered and Authorized by:   Loreen Freud DO   Signed by:   Loreen Freud DO on 01/22/2009   Method used:   Electronically to        [facility]. RX* (retail)       [address]       [address]       [place], [place]  [address]       Ph: [phone]       Fax: [phone]   RxID:   [id number] ZOCOR 40 MG TABS (SIMVASTATIN) 1 by mouth at bedtime  #30 x 2   Entered and Authorized by:   Loreen Freud DO   Signed by:   Loreen Freud DO on 01/22/2009   Method used:   Electronically to        [facility]. RX* (retail)       [address]       [place], [place]  [address]       Ph: [phone]       Fax: [phone]   RxID:   [id number]

## 2010-02-14 NOTE — Progress Notes (Signed)
Summary: yeast infection  Phone Note Call from Patient Call back at Home Phone [phone]   Caller: Patient Summary of Call: pt left VM that she has a really bad yeast infection and would [facility]...........Marland KitchenFelecia Deloach CMA  July 30, 2009 3:48 PM   pls advise.Felecia Deloach CMA  July 30, 2009 3:49 PM   Follow-up for Phone Call        diflucan sent in---if it does not clear up with 2 pills---she needs ov Follow-up by: Loreen Freud DO,  July 30, 2009 3:53 PM  Additional Follow-up for Phone Call Additional follow up Details #1::        left pt detail message rx sen,t OVINB, call with any concerns...............Marland KitchenFelecia Deloach CMA  July 30, 2009 4:42 PM     Prescriptions: DIFLUCAN 150 MG TABS (FLUCONAZOLE) Take 1 by mouth x1  may repeat in 3 days  #2 x 2   Entered by:   Jeremy Johann CMA   Authorized by:   Loreen Freud DO   Signed by:   Jeremy Johann CMA on 07/30/2009   Method used:   Re-Faxed to ...       [facility]. RX* (retail)       [address]       [place], [place]  [address]       Ph: [phone]       Fax: [phone]   RxID:   [id number] DIFLUCAN 150 MG TABS (FLUCONAZOLE) Take 1 by mouth x1  may repeat in 3 days  #2 x 2   Entered and Authorized by:   Loreen Freud DO   Signed by:   Loreen Freud DO on 07/30/2009   Method used:   Electronically to        [facility]. [phone]* (retail)       [address]       [place], [place]  [address]       Ph: [phone]       Fax: [phone]   RxID:   [id number]

## 2010-02-14 NOTE — Progress Notes (Signed)
Summary: Regarding lab results   Phone Note Outgoing Call   Call placed by: Army Fossa CMA,  February 06, 2009 4:59 PM Reason for Call: Discuss lab or test results Summary of Call: Regarding lab results, LMTCB:  DM controlled---con't meds Ideally your LDL (bad cholesterol) should be <_70___, your HDL (good cholesterol) should be >__50_ and your triglycerides should be< 150.  Diet and exercise will increase HDL and decrease the LDL and triglycerides. Read Dr. Danice Goltz book--Eat Drink and Be Healthy.  Zocor is not strong enough----start Crestor 20 mg #30, 2 refills  1 by mouth  at bedtime.  We will recheck labs in __3_ months.  272.4  250.00  bmp, hep, lipid, hgba1c, microalbumin  Follow-up for Phone Call        left message to call back to discuss lab results. Army Fossa CMA  February 07, 2009 11:42 AM   Additional Follow-up for Phone Call Additional follow up Details #1::        pt is aware. Army Fossa CMA  February 07, 2009 1:59 PM     New/Updated Medications: CRESTOR 20 MG TABS (ROSUVASTATIN CALCIUM) 1 by mouth at bedtime Prescriptions: CRESTOR 20 MG TABS (ROSUVASTATIN CALCIUM) 1 by mouth at bedtime  #30 x 2   Entered by:   Army Fossa CMA   Authorized by:   Loreen Freud DO   Signed by:   Army Fossa CMA on 02/07/2009   Method used:   Electronically to        [facility]. RX* (retail)       [address]       [place], [place]  [address]       Ph: [phone]       Fax: [phone]   RxID:   [id number]

## 2010-02-15 ENCOUNTER — Telehealth (INDEPENDENT_AMBULATORY_CARE_PROVIDER_SITE_OTHER): Payer: Self-pay | Admitting: *Deleted

## 2010-02-19 ENCOUNTER — Encounter (INDEPENDENT_AMBULATORY_CARE_PROVIDER_SITE_OTHER): Payer: Self-pay | Admitting: *Deleted

## 2010-02-20 NOTE — Progress Notes (Signed)
Summary: Results 2/3  Phone Note Outgoing Call   Call placed by: Almeta Monas CMA Duncan Dull),  February 15, 2010 3:41 PM Call placed to: Patient Details for Reason: Ideally your LDL (bad cholesterol) should be <__70__, your HDL (good cholesterol) should be >_50__ and your triglycerides should be< 150.  Diet and exercise will increase HDL and decrease the LDL and triglycerides. Read Dr. Danice Goltz book--Eat Drink and Be Healthy.  Increase Crestor to 40 mg  #30  1 by mouth at bedtime,  2 refills.   DM not controlled----con't Victoza----add glucophage xr 500mg  #30  1 by mouth  qpm , 2 refills.   +  anemia----we need to check an ifob,  pt should take slow fe 1 by mouth  once daily  ---recheck 1 month  ---285.9  cbcd, b12, ibc, ferritin.  rest of labs---We will recheck labs in _3__ months.---  272.4  250.00  hgba1c, bmp, lipid, hep  Summary of Call: spoke with patient and made her aware of the above.Marland KitchenMarland KitchenI-fob left at check in.  Initial call taken by: Almeta Monas CMA (AAMA),  February 15, 2010 4:08 PM    New/Updated Medications: CRESTOR 40 MG TABS (ROSUVASTATIN CALCIUM) 1 by mouth at bedtime GLUCOPHAGE XR 500 MG XR24H-TAB (METFORMIN HCL) 1 by mouth every evening Prescriptions: GLUCOPHAGE XR 500 MG XR24H-TAB (METFORMIN HCL) 1 by mouth every evening  #30 x 2   Entered by:   Almeta Monas CMA (AAMA)   Authorized by:   Loreen Freud DO   Signed by:   Almeta Monas CMA (AAMA) on 02/15/2010   Method used:   Faxed to ...       [facility]. RX* (retail)       [address]       [place], [place]  [address]       Ph: [phone]       Fax: [phone]   RxID:   [id number] CRESTOR 40 MG TABS (ROSUVASTATIN CALCIUM) 1 by mouth at bedtime  #30 x 2   Entered by:   Almeta Monas CMA (AAMA)   Authorized by:   Loreen Freud DO   Signed by:   Almeta Monas CMA (AAMA) on 02/15/2010   Method used:   Electronically to        [facility]. RX*  (retail)       [address]       [place], [place]  [address]       Ph: [phone]       Fax: [phone]   RxID:   [id number]

## 2010-02-20 NOTE — Assessment & Plan Note (Signed)
Summary: physical, pap and fasting labs///sph   Vital Signs:  Patient profile:   35 year old female Menstrual status:  regular LMP:     01/19/2010 Height:      67.5 inches Weight:      220.0 pounds BMI:     34.07 Pulse rate:   68 / minute Pulse rhythm:   regular BP sitting:   122 / 90  (left arm) Cuff size:   large  Vitals Entered By: Almeta Monas CMA Duncan Dull) (February 14, 2010 10:51 AM) CC: cpx/fasitng--wants to start Birth Control and get a refill on Cymbalta LMP (date): 01/19/2010     Menstrual Status regular Enter LMP: 01/19/2010   History of Present Illness: Pt here for cpe, labs and pap.     Type 1 diabetes mellitus follow-up      This is a 35 year old woman who presents with Type 2 diabetes mellitus follow-up.  The patient denies polyuria, polydipsia, blurred vision, self managed hypoglycemia, hypoglycemia requiring help, weight loss, weight gain, and numbness of extremities.  The patient denies the following symptoms: neuropathic pain, chest pain, vomiting, orthostatic symptoms, poor wound healing, intermittent claudication, vision loss, and foot ulcer.  Since the last visit the patient reports good dietary compliance, compliance with medications, exercising regularly, and monitoring blood glucose.  The patient has been measuring capillary blood glucose before breakfast.  Since the last visit, the patient reports having had eye care by an ophthalmologist and no foot care.    Preventive Screening-Counseling & Management  Alcohol-Tobacco     Alcohol drinks/day: 0     Smoking Status: never     Passive Smoke Exposure: yes     Passive Smoke Counseling: to avoid passive smoke exposure  Caffeine-Diet-Exercise     Caffeine use/day: rare     Diet Comments: healthy     Does Patient Exercise: yes     Exercise Counseling: to improve exercise regimen  Hep-HIV-STD-Contraception     Hepatitis Risk: no risk noted     HIV Risk: no risk noted     STD Risk: no risk noted  Contraception Counseling: on bcp     Dental Visit-last 6 months no     Dental Care Counseling: to seek dental care; no dental care within six months     SBE monthly: no     SBE Education/Counseling: to perform regular SBE  Safety-Violence-Falls     Seat Belt Use: yes     Firearms in the Home: no firearms in the home     Smoke Detectors: yes     Violence in the Home: no risk noted      Sexual History:  currently monogamous.        Drug Use:  never.        Blood Transfusions:  no.    Current Medications (verified): 1)  Bcp 2)  Freestyle Unistick Ii Lancets  Misc (Lancets) .... Use As Directed Twice Daily. 3)  Freestyle Lite Test  Strp (Glucose Blood) .... Use As Directed Before Breakfast and 2-2 1/2 Hours After A Meal. 4)  Crestor 20 Mg Tabs (Rosuvastatin Calcium) .Marland Kitchen.. 1 By Mouth At Bedtime 5)  Victoza 18 Mg/69ml Soln (Liraglutide) .... 1.8 Mg Subcutaneously Once Daily 6)  Fenofibrate 160 Mg Tabs (Fenofibrate) .... Take 1 By Mouth  Once Daily 7)  Diflucan 150 Mg Tabs (Fluconazole) .... Take 1 By Mouth X1  May Repeat in 3 Days 8)  Novofine 32g X 6 Mm Misc (Insulin Pen Needle) .Marland KitchenMarland KitchenMarland Kitchen  Use As Directed 9)  Cymbalta 60 Mg Cpep (Duloxetine Hcl) .Marland Kitchen.. 1 By Mouth Once Daily 10)  Ortho Tri-Cyclen Lo 0.18/0.215/0.25 Mg-25 Mcg Tabs (Norgestim-Eth Estrad Triphasic) .Marland Kitchen.. 1 By Mouth Once Daily  Allergies (verified): 1)  ! 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 migraines -- no Family History Diabetes 1st degree relative--- both parents and brother Family History Breast cancer 1st degree relative -- grandmother--65yo  Social History: Reviewed history from 11/18/2006 and no changes required. 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 Eyes:  Denies blurring, discharge, double vision, eye irritation, eye pain, halos, itching, light sensitivity, red eye, vision loss-1 eye, and vision loss-both eyes. ENT:  Denies decreased hearing, difficulty swallowing, ear discharge, earache, hoarseness, nasal congestion, nosebleeds, postnasal drainage, ringing in ears, sinus pressure, and sore throat. CV:  Denies bluish discoloration of lips or nails, chest pain or discomfort, difficulty breathing at night, difficulty breathing while lying down, fainting, fatigue, leg cramps with exertion, lightheadness, near fainting, palpitations, shortness of breath with exertion, swelling of feet, swelling of hands, and weight gain. Resp:  Denies chest discomfort, chest pain with inspiration, cough, coughing up blood, excessive snoring, hypersomnolence, morning headaches, pleuritic, shortness of breath, sputum productive, and wheezing. GI:  Denies abdominal pain, bloody stools, change in bowel habits, constipation, dark tarry stools, diarrhea, excessive appetite, gas, hemorrhoids, indigestion, loss of appetite, nausea, vomiting, vomiting blood, and yellowish skin color. GU:  Denies abnormal vaginal bleeding, decreased libido, discharge,  dysuria, genital sores, hematuria, incontinence, nocturia, urinary frequency, and urinary hesitancy. MS:  Denies joint pain, joint redness, joint swelling, loss of strength, low back pain, mid back pain, muscle aches, muscle , cramps, muscle weakness, stiffness, and thoracic pain. Derm:  Denies changes in color of skin, changes in nail beds, dryness, excessive perspiration, flushing, hair loss, insect bite(s), itching, lesion(s), poor wound healing, and rash. Neuro:  Denies brief paralysis, difficulty with concentration, disturbances in coordination, falling down, headaches, inability to speak, memory loss, numbness, poor balance, seizures, sensation of room spinning, tingling, tremors, visual disturbances, and weakness. Psych:  Denies alternate hallucination ( auditory/visual), anxiety, depression, easily angered, easily tearful, irritability, mental problems, panic attacks, sense of great danger, suicidal thoughts/plans, thoughts of violence, unusual visions or sounds, and thoughts /plans of harming others. Endo:  Denies cold intolerance, excessive hunger, excessive thirst, excessive urination, heat intolerance, polyuria, and weight change. Heme:  Denies abnormal bruising, bleeding, enlarge lymph nodes, fevers, pallor, and skin discoloration. Allergy:  Denies hives or rash, itching eyes, persistent infections, seasonal allergies, and sneezing.  Physical Exam  General:  Well-developed,well-nourished,in no acute distress; alert,appropriate and cooperative throughout examination Head:  Normocephalic and atraumatic without obvious abnormalities. No apparent alopecia  or balding. Eyes:  pupils equal, pupils round, pupils reactive to light, and no injection.   Ears:  External ear exam shows no significant lesions or deformities.  Otoscopic examination reveals clear canals, tympanic membranes are intact bilaterally without bulging, retraction, inflammation or discharge. Hearing is grossly normal  bilaterally. Nose:  External nasal examination shows no deformity or inflammation. Nasal mucosa are pink and moist without lesions or exudates. Mouth:  Oral mucosa and oropharynx without lesions or exudates.  Teeth in good repair. Neck:  No deformities, masses, or tenderness noted. Chest Wall:  No deformities, masses, or tenderness noted. Breasts:  No mass, nodules, thickening, tenderness, bulging, retraction, inflamation, nipple discharge or skin changes noted.   Lungs:  Normal respiratory effort, chest expands symmetrically. Lungs are clear to auscultation, no crackles or wheezes. Heart:  normal rate and no murmur.   Abdomen:  Bowel sounds positive,abdomen soft and non-tender without masses, organomegaly or hernias noted. Rectal:  No external abnormalities noted. Normal sphincter tone. No rectal masses or tenderness.  heme neg stool Genitalia:  Pelvic Exam:        External: normal female genitalia without lesions or masses        Vagina: normal without lesions or masses        Cervix: normal without lesions or masses        Adnexa: normal bimanual exam without masses or fullness        Uterus: normal by palpation        Pap smear: performed Msk:  normal ROM and no joint swelling.   Pulses:  R and L carotid,radial,femoral,dorsalis pedis and posterior tibial pulses are full and equal bilaterally Extremities:  No clubbing, cyanosis, edema, or deformity noted with normal full range of motion of all joints.   Neurologic:  No cranial nerve deficits noted. Station and gait are normal. Plantar reflexes are down-going bilaterally. DTRs are symmetrical throughout. Sensory, motor and coordinative functions appear intact. Skin:  Intact without suspicious lesions or rashes Cervical Nodes:  No lymphadenopathy noted Axillary Nodes:  No palpable lymphadenopathy Psych:  Cognition and judgment appear intact. Alert and cooperative with normal attention span and concentration. No apparent delusions, illusions,  hallucinations  Diabetes Management Exam:    Foot Exam (with socks and/or shoes not present):       Sensory-Pinprick/Light touch:          Left medial foot (L-4): normal          Left dorsal foot (L-5): normal          Left lateral foot (S-1): normal          Right medial foot (L-4): normal          Right dorsal foot (L-5): normal          Right lateral foot (S-1): normal       Sensory-Monofilament:          Left foot: normal          Right foot: normal       Inspection:          Left foot: normal          Right foot: normal       Nails:          Left foot: normal          Right foot: normal    Eye Exam:       Eye Exam done elsewhere  Date: 08/23/2008          Results: normal          Done by: Pearlean Brownie   Impression & Recommendations:  Problem # 1:  PREVENTIVE HEALTH CARE (ICD-V70.0) GHM utd Orders: Venipuncture (16109) TLB-Lipid Panel (80061-LIPID) TLB-BMP (Basic Metabolic Panel-BMET) (80048-METABOL) TLB-CBC Platelet - w/Differential (85025-CBCD) TLB-Hepatic/Liver Function Pnl (80076-HEPATIC) TLB-TSH (Thyroid Stimulating Hormone) (84443-TSH) TLB-A1C / Hgb A1C (Glycohemoglobin) (83036-A1C) TLB-Microalbumin/Creat Ratio, Urine (82043-MALB) UA Dipstick W/ Micro (manual) (60454) EKG w/ Interpretation (93000)  Problem # 2:  UNSPECIFIED ANEMIA (ICD-285.9)  The following medications were removed from the medication list:    Chromagen Fa 70-150-2-1 Mg Tabs (Fe asp gly-succ-c-thre-b12-fa) .Marland Kitchen... 1 by mouth once daily  Orders: Venipuncture (09811) TLB-Lipid Panel (80061-LIPID) TLB-BMP (Basic Metabolic Panel-BMET) (80048-METABOL) TLB-CBC Platelet - w/Differential (85025-CBCD) TLB-Hepatic/Liver Function Pnl (80076-HEPATIC) TLB-TSH (Thyroid Stimulating Hormone) (84443-TSH) TLB-A1C / Hgb A1C (Glycohemoglobin) (83036-A1C) TLB-Microalbumin/Creat Ratio, Urine (82043-MALB) EKG w/ Interpretation (93000)  Hgb: 9.5 (03/29/2008)   Hct: 30.3 (03/29/2008)   Platelets: 346.0  (03/29/2008) RBC: 4.22 (03/29/2008)   RDW: 16.8 (03/29/2008)   WBC: 6.5 (03/29/2008) MCV: 71.7 (03/29/2008)   MCHC: 31.3 (03/29/2008) Iron: 50 (11/18/2006)   TSH: 1.89 (03/29/2008)  Problem # 3:  DIABETES MELLITUS, TYPE II (ICD-250.00)  Her updated medication list for this problem includes:    Victoza 18 Mg/110ml Soln (Liraglutide) .Marland Kitchen... 1.8 mg subcutaneously once daily  Orders: Venipuncture (91478) TLB-Lipid Panel (80061-LIPID) TLB-BMP (Basic Metabolic Panel-BMET) (80048-METABOL) TLB-CBC Platelet - w/Differential (85025-CBCD) TLB-Hepatic/Liver Function Pnl (80076-HEPATIC) TLB-TSH (Thyroid Stimulating Hormone) (84443-TSH) TLB-A1C / Hgb A1C (Glycohemoglobin) (83036-A1C) TLB-Microalbumin/Creat Ratio, Urine (82043-MALB) EKG w/ Interpretation (93000)  Labs Reviewed: Creat: 0.7 (01/29/2009)     Last Eye Exam: normal (11/29/2008) Reviewed HgBA1c results: 6.9 (01/29/2009)  7.8 (08/02/2008)  Problem # 4:  MORBID OBESITY (ICD-278.01)  Orders: EKG w/ Interpretation (93000)  Problem # 5:  UNSPECIFIED ANEMIA (ICD-285.9)  The following medications were removed from the medication list:    Chromagen Fa 70-150-2-1 Mg Tabs (Fe asp gly-succ-c-thre-b12-fa) .Marland Kitchen... 1 by mouth once daily  Orders: Venipuncture (29562) TLB-Lipid Panel (80061-LIPID) TLB-BMP (Basic Metabolic Panel-BMET) (80048-METABOL) TLB-CBC Platelet - w/Differential (85025-CBCD) TLB-Hepatic/Liver Function Pnl (80076-HEPATIC) TLB-TSH (Thyroid Stimulating Hormone) (84443-TSH) TLB-A1C / Hgb A1C (Glycohemoglobin) (83036-A1C) TLB-Microalbumin/Creat Ratio, Urine (82043-MALB) EKG w/ Interpretation (93000)  Complete Medication List: 1)  Bcp  2)  Freestyle Unistick Ii Lancets Misc (Lancets) .... Use as directed twice daily. 3)  Freestyle Lite Test Strp (Glucose blood) .... Use as directed before breakfast and 2-2 1/2 hours after a meal. 4)  Crestor 20 Mg Tabs (Rosuvastatin calcium) .Marland Kitchen.. 1 by mouth at bedtime 5)  Victoza 18 Mg/8ml  Soln (Liraglutide) .... 1.8 mg subcutaneously once daily 6)  Fenofibrate 160 Mg Tabs (Fenofibrate) .... Take 1 by mouth  once daily 7)  Diflucan 150 Mg Tabs (Fluconazole) .... Take 1 by mouth x1  may repeat in 3 days 8)  Novofine 32g X 6 Mm Misc (Insulin pen needle) .... Use as directed 9)  Cymbalta 60 Mg Cpep (Duloxetine hcl) .Marland Kitchen.. 1 by mouth once daily 10)  Ortho Tri-cyclen Lo 0.18/0.215/0.25 Mg-25 Mcg Tabs (Norgestim-eth estrad triphasic) .Marland Kitchen.. 1 by mouth once daily  Other Orders: Tdap => 16yrs IM (13086) Admin 1st Vaccine (57846)  Patient Instructions: 1)  Please schedule a follow-up appointment in 6 months .  Prescriptions: ORTHO TRI-CYCLEN LO 0.18/0.215/0.25 MG-25 MCG TABS (NORGESTIM-ETH ESTRAD TRIPHASIC) 1 by mouth once daily  #3 x 3   Entered and Authorized by:   Loreen Freud DO  Signed by:   Loreen Freud DO on 02/14/2010   Method used:   Electronically to        [facility]* (retail)       [address]       [place], [place]  [address]       Ph: [phone]       Fax: [phone]   RxID:   [id number] CYMBALTA 60 MG CPEP (DULOXETINE HCL) 1 by mouth once daily  #90 x 3   Entered and Authorized by:   Loreen Freud DO   Signed by:   Loreen Freud DO on 02/14/2010   Method used:   Electronically to        [facility]. [facility]* (retail)       [address]       [place], [place]  [address]       Ph: [phone]       Fax: [phone]   RxID:   [id number]    Orders Added: 1)  Venipuncture [30160] 2)  TLB-Lipid Panel [80061-LIPID] 3)  TLB-BMP (Basic Metabolic Panel-BMET) [80048-METABOL] 4)  TLB-CBC Platelet - w/Differential [85025-CBCD] 5)  TLB-Hepatic/Liver Function Pnl [80076-HEPATIC] 6)  TLB-TSH (Thyroid Stimulating Hormone) [84443-TSH] 7)  TLB-A1C / Hgb A1C (Glycohemoglobin) [83036-A1C] 8)  TLB-Microalbumin/Creat Ratio, Urine [82043-MALB] 9)  Tdap => 35yrs IM [90715] 10)  Admin 1st  Vaccine [90471] 11)  UA Dipstick W/ Micro (manual) [81000] 12)  Est. Patient 18-39 years [99395] 13)  EKG w/ Interpretation [93000]   Immunizations Administered:  Tetanus Vaccine:    Vaccine Type: Tdap    Site: left deltoid    Mfr: Merck    Dose: 0.5 ml    Route: IM    Given by: Almeta Monas CMA (AAMA)    Exp. Date: 11/02/2011    Lot #: [id number]    VIS given: 12/01/07 version given February 14, 2010.   Immunizations Administered:  Tetanus Vaccine:    Vaccine Type: Tdap    Site: left deltoid    Mfr: Merck    Dose: 0.5 ml    Route: IM    Given by: Almeta Monas CMA (AAMA)    Exp. Date: 11/02/2011    Lot #: [id number]    VIS given: 12/01/07 version given February 14, 2010.  Flu Vaccine Result Date:  10/30/2009 Flu Vaccine Result:  given Flu Vaccine Next Due:  1 yr    Laboratory Results   Urine Tests   Date/Time Reported: February 14, 2010 1:39 PM   Routine Urinalysis   Color: yellow Appearance: Clear Glucose: negative   (Normal Range: Negative) Bilirubin: negative   (Normal Range: Negative) Ketone: negative   (Normal Range: Negative) Spec. Gravity: 1.025   (Normal Range: 1.003-1.035) Blood: negative   (Normal Range: Negative) pH: 5.0   (Normal Range: 5.0-8.0) Protein: 30   (Normal Range: Negative) Urobilinogen: negative   (Normal Range: 0-1) Nitrite: negative   (Normal Range: Negative) Leukocyte Esterace: negative   (Normal Range: Negative)    Comments: Floydene Flock  February 14, 2010 1:39 PM

## 2010-02-28 NOTE — Letter (Signed)
Summary: Results Follow-up Letter  [place] [facility]  [address] [place], [place] [address]   Phone: [phone]  Fax: [phone]    02/19/2010     Cheryl Hart [address] [place], [place]  [address]  [place]      Dear [name],    The following are the results of your recent test(s):    Test     Result     Pap Smear    Normal___X____  Not Normal_____            Sincerely,  Almeta Monas CMA (AAMA)  at [facility]

## 2010-03-06 ENCOUNTER — Encounter: Payer: Self-pay | Admitting: Family Medicine

## 2010-03-26 LAB — URINALYSIS, ROUTINE W REFLEX MICROSCOPIC
Bilirubin Urine: NEGATIVE
Hgb urine dipstick: NEGATIVE
Ketones, ur: 15 mg/dL — AB
Nitrite: NEGATIVE
Protein, ur: 30 mg/dL — AB
Specific Gravity, Urine: 1.029 (ref 1.005–1.030)
Urobilinogen, UA: 1 mg/dL (ref 0.0–1.0)

## 2010-03-26 LAB — BASIC METABOLIC PANEL
Calcium: 10.1 mg/dL (ref 8.4–10.5)
Creatinine, Ser: 0.58 mg/dL (ref 0.4–1.2)
GFR calc Af Amer: >60 mL/min (ref >60)

## 2010-03-26 LAB — CBC
MCH: 21.8 pg — ABNORMAL LOW (ref 26.0–34.0)
Platelets: 490 10*3/uL — ABNORMAL HIGH (ref 150–400)
RBC: 4.85 MIL/uL (ref 3.87–5.11)
WBC: 10.3 10*3/uL (ref 4.0–10.5)

## 2010-03-26 LAB — DIFFERENTIAL
Eosinophils Absolute: 0.2 10*3/uL (ref 0.0–0.7)
Lymphocytes Relative: 36 % (ref 12–46)
Lymphs Abs: 3.8 10*3/uL (ref 0.7–4.0)
Neutrophils Relative %: 55 % (ref 43–77)

## 2010-03-26 LAB — URINE MICROSCOPIC-ADD ON

## 2010-03-26 LAB — POCT PREGNANCY, URINE: Preg Test, Ur: NEGATIVE

## 2010-03-28 LAB — URINALYSIS, ROUTINE W REFLEX MICROSCOPIC
Nitrite: NEGATIVE
Protein, ur: NEGATIVE mg/dL
Specific Gravity, Urine: 1.037 — ABNORMAL HIGH (ref 1.005–1.030)
Urobilinogen, UA: 1 mg/dL (ref 0.0–1.0)

## 2010-03-28 LAB — PREGNANCY, URINE: Preg Test, Ur: NEGATIVE

## 2010-05-22 ENCOUNTER — Other Ambulatory Visit: Payer: Self-pay | Admitting: Family Medicine

## 2010-05-31 NOTE — Discharge Summary (Signed)
Cheryl Hart, Cheryl Hart               ACCOUNT NO.:  [id number]   MEDICAL RECORD NO.:  [id number]          PATIENT TYPE:  INP   LOCATION:  9134                          FACILITY:  [facility]   PHYSICIAN:  Zenaida Niece, M.D.DATE OF BIRTH:  Oct 27, 1975   DATE OF ADMISSION:  02/09/2006  DATE OF DISCHARGE:  02/14/2006                               DISCHARGE SUMMARY   ADMISSION DIAGNOSES:  1. Intrauterine pregnancy at 40+ weeks.  2. Gestational diabetes.  3. Group B strep carrier.   DISCHARGE DIAGNOSES:  1. Intrauterine pregnancy at 40+ weeks.  2. Gestational diabetes.  3. Group B strep carrier.  4. Arrest of dilation.   PROCEDURES:  On January30, Dr. Ellyn Hack performed a primary low transverse  cesarean section.   HISTORY AND PHYSICAL:  This is a 35 year old black female gravida 1,  para 0 with an EGA of 40+ weeks who presents for elective  induction/ripening.  Prenatal care complicated by gestational diabetes  controlled with glyburide and otherwise uncomplicated.   PRENATAL LABORATORY DATA:  Blood type is B+ with a negative antibody  screen, RPR nonreactive, rubella immune, hepatitis B surface antigen  negative, HIV negative, gonorrhea and chlamydia negative.  Quad screen  is normal.  One-hour Glucola 198, 3-hour GTT is 135, 225, 233 and 194,  and group B strep is positive.   PAST HISTORY:  Is essentially noncontributory.   MEDICATIONS:  Glyburide 2.5 mg p.o. daily.   PHYSICAL EXAMINATION:  She is afebrile with stable vital signs.  Fetal  heart tracing is reactive with contractions every 2 minutes after  receiving Cytotec.  ABDOMEN:  Gravid, nontender with an estimated fetal weight of 8 pounds.  CERVIX:  On the morning of January 29 is 1, 30, -3, vertex presentation  and adequate pelvis.   HOSPITAL COURSE:  The patient was admitted on the evening of January28  and had Cytotec placed for ripening.  On the morning of January29,her  exam was not significantly changed, but she was  started on Pitocin.  Initial CBGs were normal.  Also, on the morning of the 29th, she was  started on penicillin for group B strep prophylaxis.  On the afternoon  of January29, I was able to perform amniotomy which revealed a small  amount of clear fluid.  This possibly changed to light meconium.  She  did begin to enter labor early on the morning of January30.  However,  she had a very protracted course, and after several attempts at stopping  and starting Pitocin and trying to get her to continue, she arrested on  the afternoon of January30, and Dr. Ellyn Hack performed a primary low  transverse cesarean section under epidural anesthesia.  Estimated blood  loss was 900 mL, and she delivered a viable female infant with Apgars of  8 and 9 that weighed 8 pounds 8 ounces.  Postoperatively, she had no  significant complications.  Pre-delivery hemoglobin was 9.6,  postdelivery was 7.2, and she is hemodynamically stable with this.  Blood sugars did remain slightly elevated postpartum, almost all values  slightly above normal.  She did require a small  amount of insulin for  CBG of 140-150 prior to delivery.  Her incision was healing well.  On  the morning of postoperative day #3, she was felt to be stable enough  for discharge home.   DISCHARGE INSTRUCTIONS:  Regular diet, pelvic rest, no strenuous  activity.  Follow-up is in 2 weeks for incision check.  Prior to  discharge, the nurse is to remove her staples and apply Steri-Strips.   MEDICATIONS:  Percocet #40 one to two p.o. q.4-6 h. p.r.n. pain and over-  the-counter ibuprofen as needed.  She is also to break her glyburide in  half that she has at home to take 1.25 mg p.o. daily, and we will follow  this up at her 2-week visit.  She was also given our discharge pamphlet.      Zenaida Niece, M.D.  Electronically Signed     TDM/MEDQ  D:  02/14/2006  T:  02/14/2006  Job:  [id number]

## 2010-05-31 NOTE — Op Note (Signed)
NAME:  Cheryl Hart, Cheryl Hart               ACCOUNT NO.:  [id number]   MEDICAL RECORD NO.:  [id number]          PATIENT TYPE:  INP   LOCATION:  9134                          FACILITY:  [facility]   PHYSICIAN:  Sherron Monday, MD        DATE OF BIRTH:  1975/09/01   DATE OF PROCEDURE:  02/11/2006  DATE OF DISCHARGE:                               OPERATIVE REPORT   PREOPERATIVE DIAGNOSIS:  Arrest of dilatation, failure to progress,  cephalopelvic disproportion.   POSTOPERATIVE DIAGNOSIS:  Arrest of dilatation, failure to progress,  cephalopelvic disproportion, delivered via primary low transverse  cesarean section.   PROCEDURE:  Primary low transverse cesarean section.   SURGEON:  Sherron Monday, MD   ANESTHESIA:  Epidural.   COMPLICATIONS:  None, cervical extension.   PATHOLOGY:  None.   ESTIMATED BLOOD LOSS:  900 cc.   IV FLUIDS:  1200 cc.   URINE OUTPUT:  75 cc, clear.   FINDINGS:  Viable female infant at 66.  Apgars of 8 at one minute, 9  at five minutes, and a weight of 8 pounds, 8 ounces.  Normal uterus,  tubes, and ovaries.   DISPOSITION:  Stable to PACU.   PROCEDURE:  After informed consent was reviewed with the patient,  including the risks, benefits and alternatives of low transverse  cesarean section, she is transferred to the operating room where her  epidural was found to be adequate.  She was then prepped and draped in  the normal sterile fashion.  A Pfannenstiel skin incision was made at  approximately two fingerbreadths above her pubic symphysis and carried  through the underlying layer of fascia sharply.  The fascia was incised  in the midline, and the incision was extended laterally using Mayo  scissors.  The inferior portion of the incision was elevated with Kocher  clamps, and the rectus muscles are dissected off both bluntly and  sharply.  Attention was then turned to the superior portion of a fascial  incision, which was extended in a similar fashion, was elevated  with  Kocher clamps, and the rectus muscles were dissected off both bluntly  and sharply.  The peritoneum was then entered bluntly, and the incision  was extended with good visualization of the bladder.  The Alexis  retractor was then placed.  A bladder flap was created using smooth  pickups and Metzenbaum scissors and blunt dissection.  The uterine  incision was then made in a transverse fashion.  The infant was  delivered from a vertex presentation.  Nose and mouth were suctioned in  the field.  The cord was clamped and cut, and the baby was handed off to  the waiting NICU staff.  The placenta was delivered intact.  The uterus  was cleared of all clots and debris, exteriorized, and using sponge  sticks, the uterine incision was then outlined.  At this time, slight  cervical extension was noted.  It was outlined as well with sponge  stick.  The uterine incision was closed with 0 Monocryl in a running  fashion.  The cervical extension was closed in  a separate suture of 0  Monocryl.  The second layer was used to imbricate the first layer of the  stitches of Monocryl.  The clots and debris were cleared from behind the  uterus.  The uterus was returned to the abdominal cavity and again,  clots and debris were cleared.  The fascia was closed with 0 Vicryl in a  running fashion.  The subcuticular adipose layer was made hemostatic  with Bovie cautery.  Irrigation was then performed.  A running stitch of  3-0 plain gut was then placed in the subcutaneous layer.  The skin was  closed with staples.  Sponge, lap, and needle counts were correct x2 per  the operating room staff. She tolerated the procedure well. She was  taken in stable condition to the PACU following the procedure.      Sherron Monday, MD  Electronically Signed     JB/MEDQ  D:  02/11/2006  T:  02/11/2006  Job:  [id number]

## 2010-07-10 ENCOUNTER — Other Ambulatory Visit: Payer: Self-pay | Admitting: Family Medicine

## 2010-07-10 DIAGNOSIS — D649 Anemia, unspecified: Secondary | ICD-10-CM

## 2010-07-10 DIAGNOSIS — E119 Type 2 diabetes mellitus without complications: Secondary | ICD-10-CM

## 2010-07-10 DIAGNOSIS — E785 Hyperlipidemia, unspecified: Secondary | ICD-10-CM

## 2010-07-11 ENCOUNTER — Other Ambulatory Visit (INDEPENDENT_AMBULATORY_CARE_PROVIDER_SITE_OTHER): Payer: Commercial Managed Care - PPO

## 2010-07-11 DIAGNOSIS — D649 Anemia, unspecified: Secondary | ICD-10-CM

## 2010-07-11 DIAGNOSIS — E785 Hyperlipidemia, unspecified: Secondary | ICD-10-CM

## 2010-07-11 DIAGNOSIS — E119 Type 2 diabetes mellitus without complications: Secondary | ICD-10-CM

## 2010-07-11 LAB — CBC WITH DIFFERENTIAL/PLATELET
Basophils Absolute: 0.1 10*3/uL (ref 0.0–0.1)
Eosinophils Absolute: 0 10*3/uL (ref 0.0–0.7)
Lymphocytes Relative: 34 % (ref 12.0–46.0)
MCHC: 31.6 g/dL (ref 30.0–36.0)
MCV: 69.4 fl — ABNORMAL LOW (ref 78.0–100.0)
Monocytes Absolute: 0.5 10*3/uL (ref 0.1–1.0)
Neutrophils Relative %: 60.2 % (ref 43.0–77.0)
RDW: 17.6 % — ABNORMAL HIGH (ref 11.5–14.6)

## 2010-07-11 LAB — BASIC METABOLIC PANEL
BUN: 9 mg/dL (ref 6–23)
Calcium: 9.5 mg/dL (ref 8.4–10.5)
Creatinine, Ser: 0.6 mg/dL (ref 0.4–1.2)
GFR: 156.49 mL/min (ref >60.00)
Potassium: 4.2 mEq/L (ref 3.5–5.1)

## 2010-07-11 LAB — FERRITIN: Ferritin: 3.9 ng/mL — ABNORMAL LOW (ref 10.0–291.0)

## 2010-07-11 LAB — HEPATIC FUNCTION PANEL
ALT: 15 U/L (ref 0–35)
AST: 16 U/L (ref 0–37)
Alkaline Phosphatase: 117 U/L (ref 39–117)
Bilirubin, Direct: 0 mg/dL (ref 0.0–0.3)
Total Protein: 7.2 g/dL (ref 6.0–8.3)

## 2010-07-11 LAB — HEMOGLOBIN A1C: Hgb A1c MFr Bld: 8.2 % — ABNORMAL HIGH (ref 4.6–6.5)

## 2010-07-11 LAB — LIPID PANEL: Cholesterol: 193 mg/dL (ref 0–200)

## 2010-07-11 NOTE — Progress Notes (Signed)
Addended by: Legrand Como on: 07/11/2010 09:09 AM   Modules accepted: Orders

## 2010-07-11 NOTE — Progress Notes (Signed)
Labs only

## 2010-07-30 ENCOUNTER — Ambulatory Visit (INDEPENDENT_AMBULATORY_CARE_PROVIDER_SITE_OTHER): Payer: Commercial Managed Care - PPO | Admitting: Family

## 2010-07-30 ENCOUNTER — Encounter: Payer: Self-pay | Admitting: Family

## 2010-07-30 ENCOUNTER — Other Ambulatory Visit: Payer: Self-pay | Admitting: Family

## 2010-07-30 VITALS — BP 128/88 | HR 72 | Temp 98.1°F | Resp 16 | Wt 213.1 lb

## 2010-07-30 DIAGNOSIS — Z9189 Other specified personal risk factors, not elsewhere classified: Secondary | ICD-10-CM

## 2010-07-30 DIAGNOSIS — R300 Dysuria: Secondary | ICD-10-CM

## 2010-07-30 DIAGNOSIS — N898 Other specified noninflammatory disorders of vagina: Secondary | ICD-10-CM

## 2010-07-30 DIAGNOSIS — Z202 Contact with and (suspected) exposure to infections with a predominantly sexual mode of transmission: Secondary | ICD-10-CM | POA: Insufficient documentation

## 2010-07-30 LAB — POCT URINALYSIS DIPSTICK
Bilirubin, UA: NEGATIVE
Glucose, UA: 500
Ketones, UA: NEGATIVE
Leukocytes, UA: NEGATIVE
Nitrite, UA: NEGATIVE
pH, UA: 6

## 2010-07-30 MED ORDER — CEFUROXIME AXETIL 500 MG PO TABS: 500.0000 mg | ORAL_TABLET | Freq: Two times a day (BID) | ORAL | Status: DC

## 2010-07-30 NOTE — Progress Notes (Signed)
Subjective:    Patient ID: Cheryl Hart, female    DOB: Apr 19, 1975, 35 y.o.   MRN: [id number]  HPI Cheryl Hart is a 35 yr old female who presents today with several concerns:  Possible exposure to STD- Unprotected sex 2 weeks ago. Has had same partner for 4 months, but 2 weeks ago had unprotected sex. She had a Bump on exterior vulva.  + dysuria x 3 days.  Reports sharp pain, "at the pit of my stomach." Denies fever, nausea or vaginal discharge. + vaginal odor.    Sore throat started about 2 days ago.  Did participate in oral sex with her partner.     Review of Systems see HPI    Past Medical History  Diagnosis Date  . Diabetes mellitus   . Anxiety     History   Social History  . Marital Status: Single    Spouse Name: N/A    Number of Children: N/A  . Years of Education: N/A   Occupational History  . Not on file.   Social History Main Topics  . Smoking status: Never Smoker   . Smokeless tobacco: Never Used  . Alcohol Use: Not on file  . Drug Use: Not on file  . Sexually Active: Not on file   Other Topics Concern  . Not on file   Social History Narrative  . No narrative on file    Past Surgical History  Procedure Date  . Cesarean section     Family History  Problem Relation Age of Onset  . Migraines    . Diabetes Mother   . Diabetes Father   . Diabetes Brother     Allergies  Allergen Reactions  . Sulfonamide Derivatives     Current Outpatient Prescriptions on File Prior to Visit  Medication Sig Dispense Refill  . DULoxetine (CYMBALTA) 60 MG capsule Take 60 mg by mouth daily.        . fenofibrate 160 MG tablet Take 160 mg by mouth daily.        . fluconazole (DIFLUCAN) 150 MG tablet Take 150 mg by mouth once.        . FreeStyle Unistick II Lancets MISC by Does not apply route. Use as directed       . glucose blood (FREESTYLE LITE) test strip 1 each by Other route as needed. Use as instructed before breakfast and 2-2 1/2 hours after a meal       .  Insulin Pen Needle (NOVOFINE) 32G X 6 MM MISC by Does not apply route. Use as directed       . Insulin Pen Needle 32G X 6 MM MISC by Does not apply route.        . Liraglutide (VICTOZA) 18 MG/3ML SOLN Inject into the skin. As directed       . rosuvastatin (CRESTOR) 20 MG tablet Take 20 mg by mouth at bedtime.        Marland Kitchen Fe Asp Gly-Succ-C-Thre-B12-FA (CHROMAGEN FA) 70-150-2-1 MG TABS Take by mouth daily.          BP 128/88  Pulse 72  Temp(Src) 98.1 F (36.7 C) (Oral)  Resp 16  Wt 213 lb 1.3 oz (96.652 kg)  LMP 07/04/2010    Objective:   Physical Exam  Constitutional: She appears well-developed and well-nourished.  HENT:  Head: Normocephalic and atraumatic.  Mouth/Throat: Uvula is midline, oropharynx is clear and moist and mucous membranes are normal.       + tender left  anterior cervical LN is noted- approx 1cm in diameter.  Eyes: Conjunctivae are normal.  Neck: No thyromegaly present.  Cardiovascular: Normal rate and regular rhythm.   Pulmonary/Chest: Effort normal and breath sounds normal.  Genitourinary:       Thin Yellow vaginal discharge is noted.  No appreciable odor is noted.  + firm tender lesion noted on right vulva which is slightly larger than pea sized. Noted to have a yellow ulcerated center.  Lymphadenopathy:    She has cervical adenopathy.          Assessment & Plan:

## 2010-07-30 NOTE — Patient Instructions (Signed)
We will call you with the results of your testing. Call if symptoms worsen or if they do not improve.

## 2010-07-30 NOTE — Assessment & Plan Note (Signed)
Wet prep, GC/Clamydia performed today.  UA unremarkable, but will culture urine.  Labial lesion may be a HSV lesion vs carbuncle.  Will check HSV antibodies.  Plan empiric treatment with ceftin given cervical LAD with sore throat.  This will also help if labial lesion is a carbuncle. Support provided to patient. She is stressed about possibility of having an STD.

## 2010-07-31 LAB — WET PREP BY MOLECULAR PROBE: Candida species: NEGATIVE

## 2010-08-01 LAB — URINE CULTURE: Colony Count: 15000

## 2010-08-02 ENCOUNTER — Telehealth: Payer: Self-pay | Admitting: *Deleted

## 2010-08-02 LAB — GC/CHLAMYDIA PROBE AMP, GENITAL
Chlamydia, DNA Probe: NEGATIVE
GC Probe Amp, Genital: NEGATIVE

## 2010-08-02 NOTE — Telephone Encounter (Signed)
lft normal ----- Message ----- From: Candie Echevaria, CMA Sent: 08/02/2010 5:07 PM To: Lelon Perla, DO

## 2010-08-02 NOTE — Telephone Encounter (Signed)
Left message to call office

## 2010-08-02 NOTE — Telephone Encounter (Signed)
Is pt taking crestor and fenofibrate? LDL and TG not at goal. ----- Message ----- From: Candie Echevaria, CMA Sent: 08/02/2010 5:07 PM To: Lelon Perla, DO

## 2010-08-02 NOTE — Telephone Encounter (Signed)
Pt is anemic---we need to check CBCD next week asap with ibc, ferritin 285,9  ----- Message ----- From: Candie Echevaria, CMA Sent: 08/02/2010 5:07 PM To: Grayling Congress Lowne, DO  b12 low normal---she probably would benefit from b12 injection weekly x4 Then monthly---recheck b12 1 month ----- Message ----- From: Candie Echevaria, CMA Sent: 08/02/2010 5:07 PM To: Lelon Perla, DO

## 2010-08-03 ENCOUNTER — Encounter (HOSPITAL_BASED_OUTPATIENT_CLINIC_OR_DEPARTMENT_OTHER): Payer: Self-pay | Admitting: *Deleted

## 2010-08-03 ENCOUNTER — Emergency Department (HOSPITAL_BASED_OUTPATIENT_CLINIC_OR_DEPARTMENT_OTHER)
Admission: EM | Admit: 2010-08-03 | Discharge: 2010-08-03 | Disposition: A | Discharge status: Home / Self Care | Payer: Commercial Managed Care - PPO | Attending: Emergency Medicine | Admitting: Emergency Medicine

## 2010-08-03 DIAGNOSIS — J029 Acute pharyngitis, unspecified: Secondary | ICD-10-CM

## 2010-08-03 DIAGNOSIS — B009 Herpesviral infection, unspecified: Secondary | ICD-10-CM | POA: Insufficient documentation

## 2010-08-03 DIAGNOSIS — B001 Herpesviral vesicular dermatitis: Secondary | ICD-10-CM

## 2010-08-03 DIAGNOSIS — F411 Generalized anxiety disorder: Secondary | ICD-10-CM | POA: Insufficient documentation

## 2010-08-03 DIAGNOSIS — E119 Type 2 diabetes mellitus without complications: Secondary | ICD-10-CM | POA: Insufficient documentation

## 2010-08-03 LAB — RAPID STREP SCREEN (MED CTR MEBANE ONLY): Streptococcus, Group A Screen (Direct): NEGATIVE

## 2010-08-03 MED ORDER — KETOROLAC TROMETHAMINE 60 MG/2ML IM SOLN: 60.0000 mg | Freq: Once | INTRAMUSCULAR | Status: DC

## 2010-08-03 MED ORDER — HYDROCODONE-ACETAMINOPHEN 5-500 MG PO TABS
1.0000 | ORAL_TABLET | Freq: Four times a day (QID) | ORAL | Status: AC | PRN
Start: 2010-08-03 — End: 2010-08-13

## 2010-08-03 MED ORDER — VALACYCLOVIR HCL 1 G PO TABS: 1000.0000 mg | ORAL_TABLET | Freq: Three times a day (TID) | ORAL | Status: AC

## 2010-08-03 NOTE — ED Notes (Signed)
Pt states she has had a sore throat x 1 week. Saw Dr. And was placed on Cefuroxime. Getting worse instead of better.

## 2010-08-03 NOTE — ED Provider Notes (Signed)
History     Chief Complaint  Patient presents with  . Sore Throat   HPI Comments: Patient noted onset of sore throat approximately one week ago. This has been constant, moderate, associated with fevers initially but has since defervesced. She has associated lesions on her lip which were initially clear blisters with clear fluid. She attempted to pop things like a pimple however they have become scabbed over lesions. They are very sensitive to touch. The pain in her throat is worse with swallowing. She has had several doses of ibuprofen today without significant improvement in her symptoms.  Patient is a 35 y.o. female presenting with pharyngitis. The history is provided by the patient.  Sore Throat This is a new problem. Episode onset: One week ago. The problem occurs constantly. The problem has been gradually worsening. Pertinent negatives include no chest pain, no abdominal pain, no headaches and no shortness of breath. Exacerbated by: Trying to pop the blisters. The symptoms are relieved by nothing. Treatments tried: NSAID. The treatment provided no relief.    Past Medical History  Diagnosis Date  . Diabetes mellitus   . Anxiety     Past Surgical History  Procedure Date  . Cesarean section     Family History  Problem Relation Age of Onset  . Migraines    . Diabetes Mother   . Diabetes Father   . Diabetes Brother     History  Substance Use Topics  . Smoking status: Never Smoker   . Smokeless tobacco: Never Used  . Alcohol Use: No    OB History    Grav Para Term Preterm Abortions TAB SAB Ect Mult Living                  Review of Systems  Constitutional: Positive for fever.  HENT: Positive for sore throat and trouble swallowing. Negative for voice change.   Eyes: Negative for discharge and redness.  Respiratory: Negative for cough and shortness of breath.   Cardiovascular: Negative for chest pain.  Gastrointestinal: Negative for abdominal pain.  Skin: Positive  for rash.  Neurological: Negative for headaches.    Physical Exam  BP 137/96  Pulse 114  Temp(Src) 98.8 F (37.1 C) (Oral)  Resp 20  Ht 5\' 7"  (1.702 m)  Wt 212 lb (96.163 kg)  BMI 33.20 kg/m2  SpO2 98%  LMP 07/30/2010  Physical Exam  Nursing note and vitals reviewed. Constitutional: She appears well-developed and well-nourished. No distress.  HENT:  Head: Normocephalic and atraumatic.  Right Ear: External ear normal.  Left Ear: External ear normal.       TMs normal bilaterally. Patient has erythema and exudate to the posterior pharynx and bilateral tonsils. She has several shallow erythematous ulcerations on the posterior soft palate. There is several erythematous tender scabbed over lesions to the lower lip.  Eyes: Conjunctivae are normal. Right eye exhibits no discharge. Left eye exhibits no discharge. No scleral icterus.  Neck: Normal range of motion. Neck supple.  Cardiovascular: Normal rate, regular rhythm and normal heart sounds.   No murmur heard.      Pulse of 100 on my exam  Pulmonary/Chest: Effort normal and breath sounds normal. No respiratory distress. She has no wheezes. She has no rales.  Musculoskeletal: Normal range of motion. She exhibits no edema.  Lymphadenopathy:    She has no cervical adenopathy.  Neurological: She is alert.  Skin: Skin is warm and dry. She is not diaphoretic.  Rash to face as noted    ED Course  Procedures  MDM Strep sample obtained by myself. Central lab for rapid strep. Will treat presumptively for herpes infection of the mouth. Antibiotics as needed for positive strep test pending. Otherwise the patient is very well appearing and afebrile at this time.  Strep test negative, antiviral medicines for home.    Vida Roller, MD 08/03/10 2308

## 2010-08-05 ENCOUNTER — Encounter: Payer: Self-pay | Admitting: *Deleted

## 2010-08-05 MED ORDER — FENOFIBRATE 160 MG PO TABS: 160.0000 mg | ORAL_TABLET | Freq: Every day | ORAL | Status: DC

## 2010-08-05 MED ORDER — METFORMIN HCL ER 500 MG PO TB24: 500.0000 mg | ORAL_TABLET | Freq: Every evening | ORAL | Status: DC

## 2010-08-05 NOTE — Telephone Encounter (Signed)
Pt states that she is only taking crestor for cholesterol. Pt  Hemoglobin A1C was 8.2 (H) is there any recommendation that you would suggest for. Please advise

## 2010-08-05 NOTE — Telephone Encounter (Signed)
Left message to call office

## 2010-08-05 NOTE — Telephone Encounter (Signed)
Is she up to 1.8 on the victoza?  If yes--can she take metformin xr 500 mg  #30  1 po qpm with evening meal. Recheck 3 months   250.00   Hgba1c, bmp   272.4  Lipid, hep

## 2010-08-05 NOTE — Telephone Encounter (Signed)
Discuss with patient, copy of labs mailed and Rx sent to pharmacy.

## 2010-08-05 NOTE — Telephone Encounter (Signed)
She should be on a fenofibrate as well.  If fenofibrate too expensive we can try a branded with coupon but her formulary would be helpful.

## 2010-08-06 ENCOUNTER — Telehealth: Payer: Self-pay | Admitting: Family

## 2010-08-06 NOTE — Telephone Encounter (Signed)
Tried cell phone- "not able to accept calls."  Left message on home phone for patient to return my call.

## 2010-08-06 NOTE — Telephone Encounter (Signed)
Spoke to [employer] re: HSV I + II IgM result. Test that we ordered gives the combined result. For future order, we can use test code 69629 for type specific I + II IgM results.

## 2010-08-08 ENCOUNTER — Telehealth: Payer: Self-pay

## 2010-08-08 ENCOUNTER — Ambulatory Visit (INDEPENDENT_AMBULATORY_CARE_PROVIDER_SITE_OTHER): Payer: Commercial Managed Care - PPO | Admitting: *Deleted

## 2010-08-08 DIAGNOSIS — E538 Deficiency of other specified B group vitamins: Secondary | ICD-10-CM

## 2010-08-08 DIAGNOSIS — D649 Anemia, unspecified: Secondary | ICD-10-CM

## 2010-08-08 LAB — CBC WITH DIFFERENTIAL/PLATELET
Basophils Relative: 0.7 % (ref 0.0–3.0)
Eosinophils Relative: 0.3 % (ref 0.0–5.0)
HCT: 25.5 % — ABNORMAL LOW (ref 36.0–46.0)
Hemoglobin: 7.9 g/dL — CL (ref 12.0–15.0)
MCHC: 30.9 g/dL (ref 30.0–36.0)
MCV: 66.5 fl — ABNORMAL LOW (ref 78.0–100.0)
Monocytes Absolute: 0.5 10*3/uL (ref 0.1–1.0)
Neutro Abs: 3.7 10*3/uL (ref 1.4–7.7)
Neutrophils Relative %: 50.3 % (ref 43.0–77.0)
RBC: 3.84 Mil/uL — ABNORMAL LOW (ref 3.87–5.11)
WBC: 7.3 10*3/uL (ref 4.5–10.5)

## 2010-08-08 LAB — IBC PANEL: Transferrin: 293.8 mg/dL (ref 212.0–360.0)

## 2010-08-08 MED ORDER — CYANOCOBALAMIN 1000 MCG/ML IJ SOLN
1000.0000 ug | Freq: Once | INTRAMUSCULAR | Status: AC
  Administered 2010-08-08: 1000 ug via INTRAMUSCULAR

## 2010-08-08 NOTE — Telephone Encounter (Signed)
Spoke with patient and she stated she felt fine, I advised her that her HGB was 7.9. She stated she was not taking an Iron pill and she had had issues with her HGB being low in the past, she stated her periods are heavy, currently using birth control to help control it. She stated she sees Dr.Meisinger (GYN). I called and they have not seen her since 03/2008 but scheduled her an appt for August 7th at 3:40pm. I called the patient and advised her of the appt and advised her to make sure she is takin and iron supplement day. Dr.Lowne is there anything else you suggest?  Please advise   KP

## 2010-08-08 NOTE — Telephone Encounter (Signed)
agree

## 2010-08-08 NOTE — Telephone Encounter (Signed)
Ok.     KP 

## 2010-08-09 ENCOUNTER — Encounter: Payer: Self-pay | Admitting: Family

## 2010-08-09 NOTE — Telephone Encounter (Signed)
Have not received call back from pt.  Letter was sent.

## 2010-08-12 ENCOUNTER — Ambulatory Visit: Payer: Commercial Managed Care - PPO

## 2010-08-12 DIAGNOSIS — D649 Anemia, unspecified: Secondary | ICD-10-CM

## 2010-08-29 ENCOUNTER — Ambulatory Visit: Payer: Commercial Managed Care - PPO

## 2010-08-30 ENCOUNTER — Ambulatory Visit (INDEPENDENT_AMBULATORY_CARE_PROVIDER_SITE_OTHER): Payer: Commercial Managed Care - PPO | Admitting: *Deleted

## 2010-08-30 DIAGNOSIS — E538 Deficiency of other specified B group vitamins: Secondary | ICD-10-CM

## 2010-09-02 DIAGNOSIS — E538 Deficiency of other specified B group vitamins: Secondary | ICD-10-CM

## 2010-09-02 MED ORDER — CYANOCOBALAMIN 1000 MCG/ML IJ SOLN: 1000.0000 ug | Freq: Once | INTRAMUSCULAR | Status: DC

## 2010-09-02 MED ORDER — CYANOCOBALAMIN 1000 MCG/ML IJ SOLN
1000.0000 ug | Freq: Once | INTRAMUSCULAR | Status: AC
  Administered 2010-09-02: 1000 ug via INTRAMUSCULAR

## 2010-10-07 LAB — DIFFERENTIAL
Eosinophils Absolute: 0.1
Eosinophils Relative: 1
Lymphs Abs: 1.4
Monocytes Absolute: 0.5
Monocytes Relative: 5

## 2010-10-07 LAB — URINALYSIS, ROUTINE W REFLEX MICROSCOPIC
Ketones, ur: NEGATIVE
Leukocytes, UA: NEGATIVE
Nitrite: NEGATIVE
Protein, ur: 100 — AB
pH: 5.5

## 2010-10-07 LAB — COMPREHENSIVE METABOLIC PANEL
ALT: 32
AST: 35
Calcium: 9.8
GFR calc Af Amer: >60
Sodium: 140
Total Protein: 8.3

## 2010-10-07 LAB — URINE MICROSCOPIC-ADD ON

## 2010-10-07 LAB — CBC
MCHC: 32.7
RBC: 4.78
RDW: 18.4 — ABNORMAL HIGH

## 2010-10-07 LAB — PREGNANCY, URINE: Preg Test, Ur: NEGATIVE

## 2010-10-14 LAB — POCT URINALYSIS DIP (DEVICE)
Bilirubin Urine: NEGATIVE
Glucose, UA: 100 — AB
Hgb urine dipstick: NEGATIVE
Nitrite: NEGATIVE
Specific Gravity, Urine: 1.025

## 2010-10-14 LAB — POCT I-STAT, CHEM 8
Chloride: 104
Creatinine, Ser: 0.7
Glucose, Bld: 180 — ABNORMAL HIGH
HCT: 36
Potassium: 4

## 2010-10-18 LAB — POCT URINALYSIS DIP (DEVICE)
Ketones, ur: NEGATIVE
pH: 6

## 2010-11-08 ENCOUNTER — Other Ambulatory Visit: Payer: Self-pay | Admitting: Family Medicine

## 2010-11-14 ENCOUNTER — Telehealth: Payer: Self-pay | Admitting: Family Medicine

## 2010-11-15 NOTE — Telephone Encounter (Signed)
msg left to call the office     KP 

## 2010-11-20 NOTE — Telephone Encounter (Signed)
Discussed with patient and apt scheduled     KP

## 2010-11-21 ENCOUNTER — Encounter: Payer: Self-pay | Admitting: Family Medicine

## 2010-11-22 ENCOUNTER — Ambulatory Visit: Payer: Commercial Managed Care - PPO | Admitting: Family Medicine

## 2010-11-25 ENCOUNTER — Encounter: Payer: Self-pay | Admitting: Family Medicine

## 2010-11-25 ENCOUNTER — Ambulatory Visit (INDEPENDENT_AMBULATORY_CARE_PROVIDER_SITE_OTHER): Payer: Commercial Managed Care - PPO | Admitting: Family Medicine

## 2010-11-25 VITALS — BP 118/82 | HR 114 | Temp 98.6°F | Wt 220.0 lb

## 2010-11-25 DIAGNOSIS — J020 Streptococcal pharyngitis: Secondary | ICD-10-CM

## 2010-11-25 DIAGNOSIS — E669 Obesity, unspecified: Secondary | ICD-10-CM

## 2010-11-25 DIAGNOSIS — I10 Essential (primary) hypertension: Secondary | ICD-10-CM

## 2010-11-25 DIAGNOSIS — R000 Tachycardia, unspecified: Secondary | ICD-10-CM

## 2010-11-25 MED ORDER — PHENTERMINE HCL 37.5 MG PO TABS: 37.5000 mg | ORAL_TABLET | Freq: Every day | ORAL | Status: AC

## 2010-11-25 MED ORDER — PREDNISONE 10 MG PO TABS: ORAL_TABLET | ORAL | Status: DC

## 2010-11-25 MED ORDER — CLARITHROMYCIN 250 MG PO TABS: 250.0000 mg | ORAL_TABLET | Freq: Two times a day (BID) | ORAL | Status: DC

## 2010-11-25 NOTE — Progress Notes (Signed)
  Subjective:    Patient ID: Cheryl Hart, female    DOB: August 13, 1975, 35 y.o.   MRN: [id number]  HPI Pt here to restart adipex.  Pt admits to not exercising much lately but has just started zumba and she will start a low carb diet.   No complaints.   Review of Systems As above    Objective:   Physical Exam  Constitutional: She is oriented to person, place, and time. She appears well-nourished.  Cardiovascular: Normal rate, regular rhythm and normal heart sounds.   No murmur heard. Pulmonary/Chest: Effort normal and breath sounds normal.  Abdominal: Soft. Bowel sounds are normal.  Neurological: She is alert and oriented to person, place, and time.  Skin: Skin is warm and dry.  Psychiatric: She has a normal mood and affect. Her behavior is normal. Judgment and thought content normal.          Assessment & Plan:  Obesity--- discussed diet and exercise with pt                adipex daily----she needs to lose at least 1 lb a week                 rto 1 month or sooner prn

## 2010-11-25 NOTE — Patient Instructions (Signed)
Calorie Counting Diet A calorie counting diet requires you to eat the number of calories that are right for you in a day. Calories are the measurement of how much energy you get from the food you eat. Eating the right amount of calories is important for staying at a healthy weight. If you eat too many calories, your body will store them as fat and you may gain weight. If you eat too few calories, you may lose weight. Counting the number of calories you eat during a day will help you know if you are eating the right amount. A Registered Dietitian can determine how many calories you need in a day. The amount of calories needed varies from person to person. If your goal is to lose weight, you will need to eat fewer calories. Losing weight can benefit you if you are overweight or have health problems such as heart disease, high blood pressure, or diabetes. If your goal is to gain weight, you will need to eat more calories. Gaining weight may be necessary if you have a certain health problem that causes your body to need more energy. TIPS Whether you are increasing or decreasing the number of calories you eat during a day, it may be hard to get used to changes in what you eat and drink. The following are tips to help you keep track of the number of calories you eat.  Measure foods at home with measuring cups. This helps you know the amount of food and number of calories you are eating.   Restaurants often serve food in amounts that are larger than 1 serving. While eating out, estimate how many servings of a food you are given. For example, a serving of cooked rice is  cup or about the size of half of a fist. Knowing serving sizes will help you be aware of how much food you are eating at restaurants.   Ask for smaller portion sizes or child-size portions at restaurants.   Plan to eat half of a meal at a restaurant. Take the rest home or share the other half with a friend.   Read the Nutrition Facts panel on  food labels for calorie content and serving size. You can find out how many servings are in a package, the size of a serving, and the number of calories each serving has.   For example, a package might contain 3 cookies. The Nutrition Facts panel on that package says that 1 serving is 1 cookie. Below that, it will say there are 3 servings in the container. The calories section of the Nutrition Facts label says there are 90 calories. This means there are 90 calories in 1 cookie (1 serving). If you eat 1 cookie you have eaten 90 calories. If you eat all 3 cookies, you have eaten 270 calories (3 servings x 90 calories = 270 calories).  The list below tells you how big or small some common portion sizes are.  1 oz.........4 stacked dice.   3 oz.........Deck of cards.   1 tsp........Tip of little finger.   1 tbs........Thumb.   2 tbs........Golf ball.    cup.......Half of a fist.   1 cup........A fist.  KEEP A FOOD LOG Write down every food item you eat, the amount you eat, and the number of calories in each food you eat during the day. At the end of the day, you can add up the total number of calories you have eaten. It may help to keep a   list like the one below. Find out the calorie information by reading the Nutrition Facts panel on food labels. Breakfast  Bran cereal (1 cup, 110 calories).   Fat-free milk ( cup, 45 calories).  Snack  Apple (1 medium, 80 calories).  Lunch  Spinach (1 cup, 20 calories).   Tomato ( medium, 20 calories).   Chicken breast strips (3 oz, 165 calories).   Shredded cheddar cheese ( cup, 110 calories).   Light Italian dressing (2 tbs, 60 calories).   Whole-wheat bread (1 slice, 80 calories).   Tub margarine (1 tsp, 35 calories).   Vegetable soup (1 cup, 160 calories).  Dinner  Pork chop (3 oz, 190 calories).   Brown rice (1 cup, 215 calories).   Steamed broccoli ( cup, 20 calories).   Strawberries (1  cup, 65 calories).   Whipped  cream (1 tbs, 50 calories).  Daily Calorie Total: 1425 Document Released: 12/30/2004 Document Revised: 09/11/2010 Document Reviewed: 06/26/2006 [employer] Patient Information 2012 [employer]. 

## 2011-01-15 ENCOUNTER — Other Ambulatory Visit: Payer: Self-pay | Admitting: Family Medicine

## 2011-02-20 ENCOUNTER — Other Ambulatory Visit: Payer: Self-pay | Admitting: Family Medicine

## 2011-02-20 DIAGNOSIS — I10 Essential (primary) hypertension: Secondary | ICD-10-CM

## 2011-02-20 DIAGNOSIS — E785 Hyperlipidemia, unspecified: Secondary | ICD-10-CM

## 2011-02-20 DIAGNOSIS — E119 Type 2 diabetes mellitus without complications: Secondary | ICD-10-CM

## 2011-02-21 ENCOUNTER — Other Ambulatory Visit: Payer: Commercial Managed Care - PPO

## 2011-02-24 ENCOUNTER — Other Ambulatory Visit: Payer: Self-pay | Admitting: Family Medicine

## 2011-02-24 DIAGNOSIS — E119 Type 2 diabetes mellitus without complications: Secondary | ICD-10-CM

## 2011-02-24 DIAGNOSIS — E785 Hyperlipidemia, unspecified: Secondary | ICD-10-CM

## 2011-02-24 DIAGNOSIS — I10 Essential (primary) hypertension: Secondary | ICD-10-CM

## 2011-02-25 ENCOUNTER — Other Ambulatory Visit (INDEPENDENT_AMBULATORY_CARE_PROVIDER_SITE_OTHER): Payer: Commercial Managed Care - PPO

## 2011-02-25 DIAGNOSIS — E785 Hyperlipidemia, unspecified: Secondary | ICD-10-CM

## 2011-02-25 DIAGNOSIS — E119 Type 2 diabetes mellitus without complications: Secondary | ICD-10-CM

## 2011-02-25 DIAGNOSIS — I10 Essential (primary) hypertension: Secondary | ICD-10-CM

## 2011-02-25 LAB — HEMOGLOBIN A1C: Hgb A1c MFr Bld: 8.4 % — ABNORMAL HIGH (ref 4.6–6.5)

## 2011-02-25 LAB — HEPATIC FUNCTION PANEL
Bilirubin, Direct: 0 mg/dL (ref 0.0–0.3)
Total Bilirubin: 0.4 mg/dL (ref 0.3–1.2)
Total Protein: 8.2 g/dL (ref 6.0–8.3)

## 2011-02-25 LAB — BASIC METABOLIC PANEL
BUN: 12 mg/dL (ref 6–23)
Chloride: 103 mEq/L (ref 96–112)
Glucose, Bld: 251 mg/dL — ABNORMAL HIGH (ref 70–99)
Potassium: 3.9 mEq/L (ref 3.5–5.1)

## 2011-02-25 LAB — LIPID PANEL
HDL: 42.1 mg/dL (ref >39.00)
Total CHOL/HDL Ratio: 5
VLDL: 42.6 mg/dL — ABNORMAL HIGH (ref 0.0–40.0)

## 2011-03-05 ENCOUNTER — Encounter: Payer: Self-pay | Admitting: *Deleted

## 2011-03-05 ENCOUNTER — Telehealth: Payer: Self-pay | Admitting: *Deleted

## 2011-03-05 DIAGNOSIS — E785 Hyperlipidemia, unspecified: Secondary | ICD-10-CM

## 2011-03-05 DIAGNOSIS — R7989 Other specified abnormal findings of blood chemistry: Secondary | ICD-10-CM

## 2011-03-05 MED ORDER — LIRAGLUTIDE 18 MG/3ML ~~LOC~~ SOLN: SUBCUTANEOUS | Status: DC

## 2011-03-05 MED ORDER — METFORMIN HCL ER 500 MG PO TB24: 1000.0000 mg | ORAL_TABLET | Freq: Every day | ORAL | Status: DC

## 2011-03-05 NOTE — Telephone Encounter (Signed)
Message copied by Verdene Rio on Wed Mar 05, 2011  3:55 PM ------      Message from: Lelon Perla      Created: Sun Mar 02, 2011  9:08 PM       DM not controlled--- increase Glucophage xr  500 mg  2 po qd  #60  2 refills      LFT elevated--- any etoh or tylenol?  Refer to lipid clinc.        Recheck 3 months---250.00  Bmp, hgba1c

## 2011-03-05 NOTE — Telephone Encounter (Signed)
272.4  790.6 

## 2011-03-05 NOTE — Telephone Encounter (Signed)
Pt indicated that she has been taking tylenol a little more frequently lately. Pt notes that she does not drink any alcohol beverage. Pt advise to stop tylenol. Pt to be referred to lipid clinic please verify Dx codes.

## 2011-03-06 NOTE — Telephone Encounter (Signed)
Referral put in.

## 2011-03-10 ENCOUNTER — Ambulatory Visit: Payer: Commercial Managed Care - PPO

## 2011-03-11 ENCOUNTER — Other Ambulatory Visit: Payer: Self-pay | Admitting: Family Medicine

## 2011-03-13 ENCOUNTER — Ambulatory Visit (INDEPENDENT_AMBULATORY_CARE_PROVIDER_SITE_OTHER): Payer: Commercial Managed Care - PPO | Admitting: Pharmacist

## 2011-03-13 ENCOUNTER — Encounter: Payer: Self-pay | Admitting: Pharmacist

## 2011-03-13 VITALS — Wt 220.2 lb

## 2011-03-13 DIAGNOSIS — E785 Hyperlipidemia, unspecified: Secondary | ICD-10-CM | POA: Insufficient documentation

## 2011-03-13 NOTE — Assessment & Plan Note (Signed)
A. 1. Hyperlipidemia: Ms. Zieske's cholesterol panel is elevated today, as outlined in the above note. She is aware of the causes of her hypercholesterolemia and will be starting rosuvastatin 10 mg daily today in an effort to reduce her LDL by ~50% to her goal of <70. I am confident rosuvastatin will improve her triglyceride level as well, but she will begin taking fish oil 1-2 grams daily to see if she finds it tolerable. She has also been made aware of important dietary choices to make on a daily basis, and she seems prepared to begin watching her simple carbohydrate intake daily. She has been educated on the importance of healthy, olive oil and nut based fats in her diet and the subsequent reduced risk of cardiovascular event. She is also doing very well with exercise, and I encouraged her to stick with her current regimen and build as she feels comfortable.  P. 1. Hyperlipidemia: - Begin Crestor 10 mg daily - Begin taking fish oil 1-2 grams daily with meals and discontinue if intolerable for any reason - Continue some of the healthy dietary choices we outlined in our discussion - Continue the exercise plan you have established and add to it as you feel comfortable - Follow up with [place] at the lipid clinic in eight weeks

## 2011-03-13 NOTE — Patient Instructions (Addendum)
Ms. Mcauley,  It was excellent to meet you today! It sounds as though you've really taken efforts to prioritize your health and make some changes. Below, I've highlighted some of the things we've talked about today so that you can keep track.  1. We're going to start Crestor 10 mg daily today. I have e-prescribed it to [facility], and you will be able to pick it up at your leisure. Please keep track and ensure you don't have any GI issues while taking the medication. We'll follow-up to see how you're doing. 2. Please go ahead and start taking fish oil supplements 1-2 times daily with meals. This should help with your triglyceride level. If the capsules leave a "fishy taste", feel free to freeze them and see if they are more tolerable. You may stop them if they become unbearable to take. 3. Continue to enjoy oatmeal as often as possible for breakfast, and attempt to transition to some of the whole wheat products we discussed today. Wraps are a great choice, and whole wheat bread will eventually taste great, I promise! Please try to add some amounts of olive oil and various nuts into the diet to help with cardiovascular benefit. 4. Keep up with your exercise program. The outline you gave me was great, and as the weather gets nicer I'm sure you'll get more and more walking into your day.  If you keep up with what we've discussed, I'm confident your numbers will look fantastic in a couple of months at follow-up. Please give [place] a call if you have any questions or concerns, and we'll see you in about eight weeks.  Thank you, [place] R. Smith PharmD

## 2011-03-13 NOTE — Progress Notes (Signed)
[name] is a pleasant, African American woman who presents today for management of her hyperlipidemia. Due to her diabetes mellitus 2, she is at elevated cardiovascular risk with a goal LDL <70. She is compliant and willing to take any action necessary to better her health. O. Lipid Panel (02/25/2011<---07/11/2010) Total cholesterol: 204<---193 (goal <200) Triglycerides: 213<---179 (goal <150) HDL: 42<---47 (goal >40) LDL: 139.3 AST/ALT/Alk phos: 55/66/127<---16/15/117 Current cholesterol medications: - None - Patient had tried Crestor and fenofibrate in the past but has taken neither in the past ~3 months. She was sick during the administration of these medications so stopped them. Other medications: - Cymbalta 60 mg daily - Metformin CR 1000 mg daily in the morning - Victoza 18 mg injection daily in the evening Diet: Cheryl Hart has a "weakness for carbs". She understands that they are not the best choice, especially given her concomitant diabetes. After some discussion with her PCP, she now enjoys oatmeal in the morning commonly. She also has been working in more vegetables into her diet. At work, she has no problem eating healthily at lunch, where she frequently eats salads with light dressing. Dinner is often something picked up from fast food, although she understands this is probably not great either. I educated her on the importance of getting some healthy fats from nuts into her diet, and I spoke to her on the merits of whole grain versus processed white bread. She understands and will be making efforts to improve her diet until our next meeting. Exercise: Cheryl Hart has recently started walking quite vigorously. She walks at least 15 minutes daily, often up to 25-30 minutes daily. Her young daughter accompanies her, and she plans only to get more walking in as the weather improves. I educated her on the importance of working through the first tired feeling so that she builds exercise  tolerance. I have full confidence in her abilities to adhere to exercise until our next meeting.

## 2011-06-18 ENCOUNTER — Other Ambulatory Visit: Payer: Self-pay | Admitting: Family Medicine

## 2011-07-23 ENCOUNTER — Other Ambulatory Visit: Payer: Self-pay | Admitting: Family Medicine

## 2011-08-05 ENCOUNTER — Other Ambulatory Visit: Payer: Self-pay | Admitting: Family Medicine

## 2011-08-05 NOTE — Telephone Encounter (Signed)
Last seen 11/25/10. Please advise   KP

## 2011-08-06 ENCOUNTER — Other Ambulatory Visit: Payer: Self-pay | Admitting: *Deleted

## 2011-08-06 MED ORDER — FLUCONAZOLE 150 MG PO TABS: ORAL_TABLET | ORAL | Status: DC

## 2011-08-27 ENCOUNTER — Other Ambulatory Visit: Payer: Self-pay | Admitting: Family Medicine

## 2011-09-30 ENCOUNTER — Other Ambulatory Visit: Payer: Self-pay | Admitting: Family Medicine

## 2011-11-05 ENCOUNTER — Other Ambulatory Visit: Payer: Self-pay | Admitting: Family Medicine

## 2011-11-05 NOTE — Telephone Encounter (Signed)
Rx sent.    MW 

## 2011-12-10 ENCOUNTER — Other Ambulatory Visit: Payer: Self-pay | Admitting: Family Medicine

## 2011-12-10 ENCOUNTER — Other Ambulatory Visit: Payer: Self-pay

## 2011-12-10 MED ORDER — METFORMIN HCL 500 MG PO TABS: ORAL_TABLET | ORAL | Status: DC

## 2011-12-10 NOTE — Telephone Encounter (Signed)
Pt LMOVM requesting Rx metformin. Rx sent.    MW

## 2012-01-02 ENCOUNTER — Encounter: Payer: Commercial Managed Care - PPO | Admitting: Family Medicine

## 2012-01-02 DIAGNOSIS — Z0289 Encounter for other administrative examinations: Secondary | ICD-10-CM

## 2012-01-09 ENCOUNTER — Other Ambulatory Visit: Payer: Self-pay | Admitting: Family Medicine

## 2012-01-09 DIAGNOSIS — E119 Type 2 diabetes mellitus without complications: Secondary | ICD-10-CM

## 2012-01-09 NOTE — Telephone Encounter (Signed)
Refills for Victosa and Metformin sent to [facility] pharmacy.

## 2012-02-11 ENCOUNTER — Other Ambulatory Visit: Payer: Self-pay | Admitting: Family Medicine

## 2012-02-20 ENCOUNTER — Telehealth: Payer: Self-pay | Admitting: Family Medicine

## 2012-02-20 NOTE — Telephone Encounter (Signed)
noted 

## 2012-02-20 NOTE — Telephone Encounter (Signed)
FYI for MD.      KP 

## 2012-02-20 NOTE — Telephone Encounter (Signed)
Called in Elimite 5% cream, dispense one 30 gram tube.  Massage cream into skin from head to soles of feet and wash off after 8-14 hours.   Called [facility] at [phone] and they are closed until Monday.  Patient advised [facility] on [address] at [phone].  I called and spoke with Hessie Diener, pharmacist.  He advised that this only comes in a 60 gm tube.  One dispensed with no refills.

## 2012-02-20 NOTE — Telephone Encounter (Signed)
Patient Information:  Caller Name: Kosha  Phone: [phone]  Patient: Cheryl Hart  Gender: Female  DOB: 12-11-1975  Age: 37 Years  PCP: Lelon Perla.  Pregnant: No  Office Follow Up:  Does the office need to follow up with this patient?: Yes  Instructions For The Office: Uses [facility]   Symptoms  Reason For Call & Symptoms: Patient calling, has had a rash for 2 months.  Her mom has same rash and was seen today by her PCP and dx with scabies.  The rash is on both inner thighs and near her buttocks.  Reviewed Health History In EMR: Yes  Reviewed Medications In EMR: Yes  Reviewed Allergies In EMR: Yes  Reviewed Surgeries / Procedures: Yes  Date of Onset of Symptoms: 12/14/2011 OB / GYN:  LMP: 01/26/2012  Guideline(s) Used:  Rash or Redness - Widespread  Disposition Per Guideline:   See Today or Tomorrow in Office  Reason For Disposition Reached:   Mild widespread rash  Advice Given:  N/A  RN Overrode Recommendation:  Patient Requests Prescription  Lives in home with her mom who was dx with scabies today

## 2012-03-18 ENCOUNTER — Other Ambulatory Visit (HOSPITAL_COMMUNITY)
Admission: RE | Admit: 2012-03-18 | Discharge: 2012-03-18 | Disposition: A | Discharge status: Home / Self Care | Payer: Commercial Managed Care - PPO | Source: Ambulatory Visit | Attending: Family Medicine | Admitting: Family Medicine

## 2012-03-18 ENCOUNTER — Encounter: Payer: Self-pay | Admitting: Family Medicine

## 2012-03-18 ENCOUNTER — Other Ambulatory Visit: Payer: Self-pay | Admitting: Family Medicine

## 2012-03-18 ENCOUNTER — Ambulatory Visit (INDEPENDENT_AMBULATORY_CARE_PROVIDER_SITE_OTHER): Payer: Commercial Managed Care - PPO | Admitting: Family Medicine

## 2012-03-18 VITALS — BP 124/80 | HR 109 | Temp 98.6°F | Ht 66.6 in | Wt 208.0 lb

## 2012-03-18 DIAGNOSIS — I10 Essential (primary) hypertension: Secondary | ICD-10-CM

## 2012-03-18 DIAGNOSIS — E118 Type 2 diabetes mellitus with unspecified complications: Secondary | ICD-10-CM

## 2012-03-18 DIAGNOSIS — Z23 Encounter for immunization: Secondary | ICD-10-CM

## 2012-03-18 DIAGNOSIS — E785 Hyperlipidemia, unspecified: Secondary | ICD-10-CM

## 2012-03-18 DIAGNOSIS — E1165 Type 2 diabetes mellitus with hyperglycemia: Secondary | ICD-10-CM

## 2012-03-18 DIAGNOSIS — Z0000 Encounter for general adult medical examination without abnormal findings: Secondary | ICD-10-CM

## 2012-03-18 DIAGNOSIS — Z01419 Encounter for gynecological examination (general) (routine) without abnormal findings: Secondary | ICD-10-CM | POA: Insufficient documentation

## 2012-03-18 DIAGNOSIS — IMO0002 Reserved for concepts with insufficient information to code with codable children: Secondary | ICD-10-CM

## 2012-03-18 DIAGNOSIS — E119 Type 2 diabetes mellitus without complications: Secondary | ICD-10-CM

## 2012-03-18 LAB — CBC WITH DIFFERENTIAL/PLATELET
Basophils Relative: 0.6 % (ref 0.0–3.0)
Eosinophils Relative: 1.2 % (ref 0.0–5.0)
Lymphocytes Relative: 39.8 % (ref 12.0–46.0)
MCV: 75.3 fl — ABNORMAL LOW (ref 78.0–100.0)
Monocytes Absolute: 0.4 10*3/uL (ref 0.1–1.0)
Monocytes Relative: 5.8 % (ref 3.0–12.0)
Neutrophils Relative %: 52.6 % (ref 43.0–77.0)
Platelets: 335 10*3/uL (ref 150.0–400.0)
RBC: 4.59 Mil/uL (ref 3.87–5.11)
WBC: 7.8 10*3/uL (ref 4.5–10.5)

## 2012-03-18 LAB — MICROALBUMIN / CREATININE URINE RATIO
Microalb Creat Ratio: 6.5 mg/g (ref 0.0–30.0)
Microalb, Ur: 2.9 mg/dL — ABNORMAL HIGH (ref 0.0–1.9)

## 2012-03-18 LAB — BASIC METABOLIC PANEL
BUN: 10 mg/dL (ref 6–23)
Calcium: 9.5 mg/dL (ref 8.4–10.5)
Chloride: 98 mEq/L (ref 96–112)
Creatinine, Ser: 0.7 mg/dL (ref 0.4–1.2)
GFR: 126.09 mL/min (ref >60.00)

## 2012-03-18 LAB — LDL CHOLESTEROL, DIRECT: Direct LDL: 148.9 mg/dL

## 2012-03-18 LAB — HEPATIC FUNCTION PANEL
ALT: 100 U/L — ABNORMAL HIGH (ref 0–35)
AST: 98 U/L — ABNORMAL HIGH (ref 0–37)
Bilirubin, Direct: 0 mg/dL (ref 0.0–0.3)
Total Bilirubin: 0.3 mg/dL (ref 0.3–1.2)

## 2012-03-18 LAB — LIPID PANEL
Cholesterol: 220 mg/dL — ABNORMAL HIGH (ref 0–200)
Total CHOL/HDL Ratio: 7
VLDL: 54.4 mg/dL — ABNORMAL HIGH (ref 0.0–40.0)

## 2012-03-18 LAB — HEMOGLOBIN A1C: Hgb A1c MFr Bld: 11.8 % — ABNORMAL HIGH (ref 4.6–6.5)

## 2012-03-18 MED ORDER — METFORMIN HCL ER 750 MG PO TB24: ORAL_TABLET | ORAL | Status: DC

## 2012-03-18 NOTE — Progress Notes (Signed)
Subjective:     Cheryl Hart is a 37 y.o. female and is here for a comprehensive physical exam. The patient reports no problems.  History   Social History  . Marital Status: Single    Spouse Name: N/A    Number of Children: N/A  . Years of Education: N/A   Occupational History  . psych tech [employer]   Social History Main Topics  . Smoking status: Never Smoker   . Smokeless tobacco: Never Used  . Alcohol Use: No  . Drug Use: No  . Sexually Active: Yes -- Female partner(s)    Birth Control/ Protection: Pill   Other Topics Concern  . Not on file   Social History Narrative   Exercise-- no   Health Maintenance  Topic Date Due  . Urine Microalbumin  02/15/2011  . Hemoglobin A1c  08/25/2011  . Ophthalmology Exam  03/25/2012  . Influenza Vaccine  09/13/2012  . Foot Exam  03/18/2013  . Pap Smear  03/19/2015  . Pneumococcal Polysaccharide Vaccine (#2) 03/18/2017  . Tetanus/tdap  02/15/2020    The following portions of the patient's history were reviewed and updated as appropriate:  She  has a past medical history of Diabetes mellitus; Anxiety; and Hyperlipidemia. She  does not have any pertinent problems on file. She  has past surgical history that includes Cesarean section and g1 p1. Her family history includes Diabetes in her brother, father, and mother and Migraines in an unspecified family member. She  reports that she has never smoked. She has never used smokeless tobacco. She reports that she does not drink alcohol or use illicit drugs. She has a current medication list which includes the following prescription(s): cymbalta, chromagen fa, fenofibrate, fluconazole, freestyle unistick ii lancets, glucose blood, ibuprofen, insulin pen needle, insulin pen needle, victoza, and rosuvastatin. Current Outpatient Prescriptions on File Prior to Visit  Medication Sig Dispense Refill  . CYMBALTA 60 MG capsule TAKE ONE CAPSULE BY MOUTH EVERY DAY  90 capsule  1   . Fe Asp Gly-Succ-C-Thre-B12-FA (CHROMAGEN FA) 70-150-2-1 MG TABS Take by mouth daily.        . fenofibrate 160 MG tablet Take 1 tablet (160 mg total) by mouth daily.  30 tablet  2  . fluconazole (DIFLUCAN) 150 MG tablet TAKE ONE TABLET BY MOUTH ONCE DAILY MAY REPEAT IN 3 DAYS  2 tablet  2  . FreeStyle Unistick II Lancets MISC by Does not apply route. Use as directed       . glucose blood (FREESTYLE LITE) test strip 1 each by Other route as needed. Use as instructed before breakfast and 2-2 1/2 hours after a meal       . ibuprofen (ADVIL,MOTRIN) 200 MG tablet Take 800 mg by mouth every 4 (four) hours as needed. pain       . Insulin Pen Needle (NOVOFINE) 32G X 6 MM MISC by Does not apply route. Use as directed       . Insulin Pen Needle 32G X 6 MM MISC by Does not apply route.        Marland Kitchen VICTOZA 18 MG/3ML SOLN INJECT 1.8 MG SUBCUTANEOUSLY EVERY DAY.  3 mL  1  . rosuvastatin (CRESTOR) 20 MG tablet Take 20 mg by mouth at bedtime.         No current facility-administered medications on file prior to visit.   She is allergic to sulfonamide derivatives..  Review of Systems Review of Systems  Constitutional: Negative for activity  change, appetite change and fatigue.  HENT: Negative for hearing loss, congestion, tinnitus and ear discharge.  dentist-due) Eyes: Negative for visual disturbance (see optho q2y -- vision corrected to 20/20 with glasses).  Respiratory: Negative for cough, chest tightness and shortness of breath.   Cardiovascular: Negative for chest pain, palpitations and leg swelling.  Gastrointestinal: Negative for abdominal pain, diarrhea, constipation and abdominal distention.  Genitourinary: Negative for urgency, frequency, decreased urine volume and difficulty urinating.  Musculoskeletal: Negative for back pain, arthralgias and gait problem.  Skin: Negative for color change, pallor and rash.  Neurological: Negative for dizziness, light-headedness, numbness and headaches.   Hematological: Negative for adenopathy. Does not bruise/bleed easily.  Psychiatric/Behavioral: Negative for suicidal ideas, confusion, sleep disturbance, self-injury, dysphoric mood, decreased concentration and agitation.       Objective:    BP 124/80  Pulse 109  Temp(Src) 98.6 F (37 C) (Oral)  Ht 5' 6.6" (1.692 m)  Wt 208 lb (94.348 kg)  BMI 32.96 kg/m2  SpO2 99%  LMP 02/20/2012 General appearance: alert, cooperative, appears stated age and no distress Head: Normocephalic, without obvious abnormality, atraumatic Eyes: conjunctivae/corneas clear. PERRL, EOM's intact. Fundi benign. Ears: normal TM's and external ear canals both ears Nose: Nares normal. Septum midline. Mucosa normal. No drainage or sinus tenderness. Throat: lips, mucosa, and tongue normal; teeth and gums normal Neck: no adenopathy, no carotid bruit, no JVD, supple, symmetrical, trachea midline and thyroid not enlarged, symmetric, no tenderness/mass/nodules Back: symmetric, no curvature. ROM normal. No CVA tenderness. Lungs: clear to auscultation bilaterally Breasts: normal appearance, no masses or tenderness Heart: regular rate and rhythm, S1, S2 normal, no murmur, click, rub or gallop Abdomen: soft, non-tender; bowel sounds normal; no masses,  no organomegaly Pelvic: cervix normal in appearance, external genitalia normal, no adnexal masses or tenderness, no cervical motion tenderness, rectovaginal septum normal, uterus normal size, shape, and consistency and vagina normal without discharge Extremities: extremities normal, atraumatic, no cyanosis or edema Pulses: 2+ and symmetric Skin: Skin color, texture, turgor normal. No rashes or lesions Lymph nodes: Cervical, supraclavicular, and axillary nodes normal. Neurologic: Alert and oriented X 3, normal strength and tone. Normal symmetric reflexes. Normal coordination and gait Psych-  No anxiety or depression      Assessment:    Healthy female exam.      Plan:   Check labs ghm utd See AVS   See After Visit Summary for Counseling Recommendations

## 2012-03-18 NOTE — Assessment & Plan Note (Signed)
Check labs con't meds 

## 2012-03-18 NOTE — Assessment & Plan Note (Signed)
Inc metformin xr  750 mg 2 po qpm  #60

## 2012-03-18 NOTE — Addendum Note (Signed)
Addended by: Arnette Norris on: 03/18/2012 12:10 PM   Modules accepted: Orders

## 2012-03-18 NOTE — Patient Instructions (Signed)
Preventive Care for Adults, Female A healthy lifestyle and preventive care can promote health and wellness. Preventive health guidelines for women include the following key practices.  A routine yearly physical is a good way to check with your caregiver about your health and preventive screening. It is a chance to share any concerns and updates on your health, and to receive a thorough exam.  Visit your dentist for a routine exam and preventive care every 6 months. Brush your teeth twice a day and floss once a day. Good oral hygiene prevents tooth decay and gum disease.  The frequency of eye exams is based on your age, health, family medical history, use of contact lenses, and other factors. Follow your caregiver's recommendations for frequency of eye exams.  Eat a healthy diet. Foods like vegetables, fruits, whole grains, low-fat dairy products, and lean protein foods contain the nutrients you need without too many calories. Decrease your intake of foods high in solid fats, added sugars, and salt. Eat the right amount of calories for you.Get information about a proper diet from your caregiver, if necessary.  Regular physical exercise is one of the most important things you can do for your health. Most adults should get at least 150 minutes of moderate-intensity exercise (any activity that increases your heart rate and causes you to sweat) each week. In addition, most adults need muscle-strengthening exercises on 2 or more days a week.  Maintain a healthy weight. The body mass index (BMI) is a screening tool to identify possible weight problems. It provides an estimate of body fat based on height and weight. Your caregiver can help determine your BMI, and can help you achieve or maintain a healthy weight.For adults 20 years and older:  A BMI below 18.5 is considered underweight.  A BMI of 18.5 to 24.9 is normal.  A BMI of 25 to 29.9 is considered overweight.  A BMI of 30 and above is  considered obese.  Maintain normal blood lipids and cholesterol levels by exercising and minimizing your intake of saturated fat. Eat a balanced diet with plenty of fruit and vegetables. Blood tests for lipids and cholesterol should begin at age 20 and be repeated every 5 years. If your lipid or cholesterol levels are high, you are over 50, or you are at high risk for heart disease, you may need your cholesterol levels checked more frequently.Ongoing high lipid and cholesterol levels should be treated with medicines if diet and exercise are not effective.  If you smoke, find out from your caregiver how to quit. If you do not use tobacco, do not start.  If you are pregnant, do not drink alcohol. If you are breastfeeding, be very cautious about drinking alcohol. If you are not pregnant and choose to drink alcohol, do not exceed 1 drink per day. One drink is considered to be 12 ounces (355 mL) of beer, 5 ounces (148 mL) of wine, or 1.5 ounces (44 mL) of liquor.  Avoid use of street drugs. Do not share needles with anyone. Ask for help if you need support or instructions about stopping the use of drugs.  High blood pressure causes heart disease and increases the risk of stroke. Your blood pressure should be checked at least every 1 to 2 years. Ongoing high blood pressure should be treated with medicines if weight loss and exercise are not effective.  If you are 55 to 39 years old, ask your caregiver if you should take aspirin to prevent strokes.  Diabetes   screening involves taking a blood sample to check your fasting blood sugar level. This should be done once every 3 years, after age 45, if you are within normal weight and without risk factors for diabetes. Testing should be considered at a younger age or be carried out more frequently if you are overweight and have at least 1 risk factor for diabetes.  Breast cancer screening is essential preventive care for women. You should practice "breast  self-awareness." This means understanding the normal appearance and feel of your breasts and may include breast self-examination. Any changes detected, no matter how small, should be reported to a caregiver. Women in their 20s and 30s should have a clinical breast exam (CBE) by a caregiver as part of a regular health exam every 1 to 3 years. After age 40, women should have a CBE every year. Starting at age 40, women should consider having a mammography (breast X-ray test) every year. Women who have a family history of breast cancer should talk to their caregiver about genetic screening. Women at a high risk of breast cancer should talk to their caregivers about having magnetic resonance imaging (MRI) and a mammography every year.  The Pap test is a screening test for cervical cancer. A Pap test can show cell changes on the cervix that might become cervical cancer if left untreated. A Pap test is a procedure in which cells are obtained and examined from the lower end of the uterus (cervix).  Women should have a Pap test starting at age 21.  Between ages 21 and 29, Pap tests should be repeated every 2 years.  Beginning at age 30, you should have a Pap test every 3 years as long as the past 3 Pap tests have been normal.  Some women have medical problems that increase the chance of getting cervical cancer. Talk to your caregiver about these problems. It is especially important to talk to your caregiver if a new problem develops soon after your last Pap test. In these cases, your caregiver may recommend more frequent screening and Pap tests.  The above recommendations are the same for women who have or have not gotten the vaccine for human papillomavirus (HPV).  If you had a hysterectomy for a problem that was not cancer or a condition that could lead to cancer, then you no longer need Pap tests. Even if you no longer need a Pap test, a regular exam is a good idea to make sure no other problems are  starting.  If you are between ages 65 and 70, and you have had normal Pap tests going back 10 years, you no longer need Pap tests. Even if you no longer need a Pap test, a regular exam is a good idea to make sure no other problems are starting.  If you have had past treatment for cervical cancer or a condition that could lead to cancer, you need Pap tests and screening for cancer for at least 20 years after your treatment.  If Pap tests have been discontinued, risk factors (such as a new sexual partner) need to be reassessed to determine if screening should be resumed.  The HPV test is an additional test that may be used for cervical cancer screening. The HPV test looks for the virus that can cause the cell changes on the cervix. The cells collected during the Pap test can be tested for HPV. The HPV test could be used to screen women aged 30 years and older, and should   be used in women of any age who have unclear Pap test results. After the age of 30, women should have HPV testing at the same frequency as a Pap test.  Colorectal cancer can be detected and often prevented. Most routine colorectal cancer screening begins at the age of 50 and continues through age 75. However, your caregiver may recommend screening at an earlier age if you have risk factors for colon cancer. On a yearly basis, your caregiver may provide home test kits to check for hidden blood in the stool. Use of a small camera at the end of a tube, to directly examine the colon (sigmoidoscopy or colonoscopy), can detect the earliest forms of colorectal cancer. Talk to your caregiver about this at age 50, when routine screening begins. Direct examination of the colon should be repeated every 5 to 10 years through age 75, unless early forms of pre-cancerous polyps or small growths are found.  Hepatitis C blood testing is recommended for all people born from 1945 through 1965 and any individual with known risks for hepatitis C.  Practice  safe sex. Use condoms and avoid high-risk sexual practices to reduce the spread of sexually transmitted infections (STIs). STIs include gonorrhea, chlamydia, syphilis, trichomonas, herpes, HPV, and human immunodeficiency virus (HIV). Herpes, HIV, and HPV are viral illnesses that have no cure. They can result in disability, cancer, and death. Sexually active women aged 25 and younger should be checked for chlamydia. Older women with new or multiple partners should also be tested for chlamydia. Testing for other STIs is recommended if you are sexually active and at increased risk.  Osteoporosis is a disease in which the bones lose minerals and strength with aging. This can result in serious bone fractures. The risk of osteoporosis can be identified using a bone density scan. Women ages 65 and over and women at risk for fractures or osteoporosis should discuss screening with their caregivers. Ask your caregiver whether you should take a calcium supplement or vitamin D to reduce the rate of osteoporosis.  Menopause can be associated with physical symptoms and risks. Hormone replacement therapy is available to decrease symptoms and risks. You should talk to your caregiver about whether hormone replacement therapy is right for you.  Use sunscreen with sun protection factor (SPF) of 30 or more. Apply sunscreen liberally and repeatedly throughout the day. You should seek shade when your shadow is shorter than you. Protect yourself by wearing long sleeves, pants, a wide-brimmed hat, and sunglasses year round, whenever you are outdoors.  Once a month, do a whole body skin exam, using a mirror to look at the skin on your back. Notify your caregiver of new moles, moles that have irregular borders, moles that are larger than a pencil eraser, or moles that have changed in shape or color.  Stay current with required immunizations.  Influenza. You need a dose every fall (or winter). The composition of the flu vaccine  changes each year, so being vaccinated once is not enough.  Pneumococcal polysaccharide. You need 1 to 2 doses if you smoke cigarettes or if you have certain chronic medical conditions. You need 1 dose at age 65 (or older) if you have never been vaccinated.  Tetanus, diphtheria, pertussis (Tdap, Td). Get 1 dose of Tdap vaccine if you are younger than age 65, are over 65 and have contact with an infant, are a healthcare worker, are pregnant, or simply want to be protected from whooping cough. After that, you need a Td   booster dose every 10 years. Consult your caregiver if you have not had at least 3 tetanus and diphtheria-containing shots sometime in your life or have a deep or dirty wound.  HPV. You need this vaccine if you are a woman age 26 or younger. The vaccine is given in 3 doses over 6 months.  Measles, mumps, rubella (MMR). You need at least 1 dose of MMR if you were born in 1957 or later. You may also need a second dose.  Meningococcal. If you are age 19 to 21 and a first-year college student living in a residence hall, or have one of several medical conditions, you need to get vaccinated against meningococcal disease. You may also need additional booster doses.  Zoster (shingles). If you are age 60 or older, you should get this vaccine.  Varicella (chickenpox). If you have never had chickenpox or you were vaccinated but received only 1 dose, talk to your caregiver to find out if you need this vaccine.  Hepatitis A. You need this vaccine if you have a specific risk factor for hepatitis A virus infection or you simply wish to be protected from this disease. The vaccine is usually given as 2 doses, 6 to 18 months apart.  Hepatitis B. You need this vaccine if you have a specific risk factor for hepatitis B virus infection or you simply wish to be protected from this disease. The vaccine is given in 3 doses, usually over 6 months. Preventive Services / Frequency Ages 19 to 39  Blood  pressure check.** / Every 1 to 2 years.  Lipid and cholesterol check.** / Every 5 years beginning at age 20.  Clinical breast exam.** / Every 3 years for women in their 20s and 30s.  Pap test.** / Every 2 years from ages 21 through 29. Every 3 years starting at age 30 through age 65 or 70 with a history of 3 consecutive normal Pap tests.  HPV screening.** / Every 3 years from ages 30 through ages 65 to 70 with a history of 3 consecutive normal Pap tests.  Hepatitis C blood test.** / For any individual with known risks for hepatitis C.  Skin self-exam. / Monthly.  Influenza immunization.** / Every year.  Pneumococcal polysaccharide immunization.** / 1 to 2 doses if you smoke cigarettes or if you have certain chronic medical conditions.  Tetanus, diphtheria, pertussis (Tdap, Td) immunization. / A one-time dose of Tdap vaccine. After that, you need a Td booster dose every 10 years.  HPV immunization. / 3 doses over 6 months, if you are 26 and younger.  Measles, mumps, rubella (MMR) immunization. / You need at least 1 dose of MMR if you were born in 1957 or later. You may also need a second dose.  Meningococcal immunization. / 1 dose if you are age 19 to 21 and a first-year college student living in a residence hall, or have one of several medical conditions, you need to get vaccinated against meningococcal disease. You may also need additional booster doses.  Varicella immunization.** / Consult your caregiver.  Hepatitis A immunization.** / Consult your caregiver. 2 doses, 6 to 18 months apart.  Hepatitis B immunization.** / Consult your caregiver. 3 doses usually over 6 months. Ages 40 to 64  Blood pressure check.** / Every 1 to 2 years.  Lipid and cholesterol check.** / Every 5 years beginning at age 20.  Clinical breast exam.** / Every year after age 40.  Mammogram.** / Every year beginning at age 40   and continuing for as long as you are in good health. Consult with your  caregiver.  Pap test.** / Every 3 years starting at age 30 through age 65 or 70 with a history of 3 consecutive normal Pap tests.  HPV screening.** / Every 3 years from ages 30 through ages 65 to 70 with a history of 3 consecutive normal Pap tests.  Fecal occult blood test (FOBT) of stool. / Every year beginning at age 50 and continuing until age 75. You may not need to do this test if you get a colonoscopy every 10 years.  Flexible sigmoidoscopy or colonoscopy.** / Every 5 years for a flexible sigmoidoscopy or every 10 years for a colonoscopy beginning at age 50 and continuing until age 75.  Hepatitis C blood test.** / For all people born from 1945 through 1965 and any individual with known risks for hepatitis C.  Skin self-exam. / Monthly.  Influenza immunization.** / Every year.  Pneumococcal polysaccharide immunization.** / 1 to 2 doses if you smoke cigarettes or if you have certain chronic medical conditions.  Tetanus, diphtheria, pertussis (Tdap, Td) immunization.** / A one-time dose of Tdap vaccine. After that, you need a Td booster dose every 10 years.  Measles, mumps, rubella (MMR) immunization. / You need at least 1 dose of MMR if you were born in 1957 or later. You may also need a second dose.  Varicella immunization.** / Consult your caregiver.  Meningococcal immunization.** / Consult your caregiver.  Hepatitis A immunization.** / Consult your caregiver. 2 doses, 6 to 18 months apart.  Hepatitis B immunization.** / Consult your caregiver. 3 doses, usually over 6 months. Ages 65 and over  Blood pressure check.** / Every 1 to 2 years.  Lipid and cholesterol check.** / Every 5 years beginning at age 20.  Clinical breast exam.** / Every year after age 40.  Mammogram.** / Every year beginning at age 40 and continuing for as long as you are in good health. Consult with your caregiver.  Pap test.** / Every 3 years starting at age 30 through age 65 or 70 with a 3  consecutive normal Pap tests. Testing can be stopped between 65 and 70 with 3 consecutive normal Pap tests and no abnormal Pap or HPV tests in the past 10 years.  HPV screening.** / Every 3 years from ages 30 through ages 65 or 70 with a history of 3 consecutive normal Pap tests. Testing can be stopped between 65 and 70 with 3 consecutive normal Pap tests and no abnormal Pap or HPV tests in the past 10 years.  Fecal occult blood test (FOBT) of stool. / Every year beginning at age 50 and continuing until age 75. You may not need to do this test if you get a colonoscopy every 10 years.  Flexible sigmoidoscopy or colonoscopy.** / Every 5 years for a flexible sigmoidoscopy or every 10 years for a colonoscopy beginning at age 50 and continuing until age 75.  Hepatitis C blood test.** / For all people born from 1945 through 1965 and any individual with known risks for hepatitis C.  Osteoporosis screening.** / A one-time screening for women ages 65 and over and women at risk for fractures or osteoporosis.  Skin self-exam. / Monthly.  Influenza immunization.** / Every year.  Pneumococcal polysaccharide immunization.** / 1 dose at age 65 (or older) if you have never been vaccinated.  Tetanus, diphtheria, pertussis (Tdap, Td) immunization. / A one-time dose of Tdap vaccine if you are over   65 and have contact with an infant, are a healthcare worker, or simply want to be protected from whooping cough. After that, you need a Td booster dose every 10 years.  Varicella immunization.** / Consult your caregiver.  Meningococcal immunization.** / Consult your caregiver.  Hepatitis A immunization.** / Consult your caregiver. 2 doses, 6 to 18 months apart.  Hepatitis B immunization.** / Check with your caregiver. 3 doses, usually over 6 months. ** Family history and personal history of risk and conditions may change your caregiver's recommendations. Document Released: 02/25/2001 Document Revised: 03/24/2011  Document Reviewed: 05/27/2010 [employer] Patient Information 2013 [employer].  

## 2012-03-24 ENCOUNTER — Other Ambulatory Visit: Payer: Self-pay

## 2012-03-24 DIAGNOSIS — E119 Type 2 diabetes mellitus without complications: Secondary | ICD-10-CM

## 2012-04-01 ENCOUNTER — Other Ambulatory Visit: Payer: Self-pay | Admitting: Family Medicine

## 2012-04-02 ENCOUNTER — Telehealth: Payer: Self-pay | Admitting: Family Medicine

## 2012-04-02 NOTE — Telephone Encounter (Signed)
Rx faxed this morning.      KP

## 2012-04-02 NOTE — Telephone Encounter (Signed)
REFILL ON VICTOZA 18 MG /3 ML INJECT P  #9  SIG: INJECT 1.8 MG SUBCUTANEOUSLY EVERYDAY ( PRESCRIBED REFILLS 1) LAST FILLED ON 02.12.14

## 2012-04-12 ENCOUNTER — Ambulatory Visit: Payer: Commercial Managed Care - PPO | Admitting: Internal Medicine

## 2012-05-10 ENCOUNTER — Other Ambulatory Visit: Payer: Self-pay | Admitting: Family Medicine

## 2012-05-10 NOTE — Telephone Encounter (Signed)
Please advise on Diflucan refill.      KP

## 2012-05-13 ENCOUNTER — Encounter (HOSPITAL_BASED_OUTPATIENT_CLINIC_OR_DEPARTMENT_OTHER): Payer: Self-pay | Admitting: Emergency Medicine

## 2012-05-13 ENCOUNTER — Emergency Department (HOSPITAL_BASED_OUTPATIENT_CLINIC_OR_DEPARTMENT_OTHER)
Admission: EM | Admit: 2012-05-13 | Discharge: 2012-05-13 | Disposition: A | Discharge status: Home / Self Care | Payer: Self-pay | Attending: Emergency Medicine | Admitting: Emergency Medicine

## 2012-05-13 DIAGNOSIS — Z3202 Encounter for pregnancy test, result negative: Secondary | ICD-10-CM | POA: Insufficient documentation

## 2012-05-13 DIAGNOSIS — N3091 Cystitis, unspecified with hematuria: Secondary | ICD-10-CM

## 2012-05-13 DIAGNOSIS — R3915 Urgency of urination: Secondary | ICD-10-CM | POA: Insufficient documentation

## 2012-05-13 DIAGNOSIS — F411 Generalized anxiety disorder: Secondary | ICD-10-CM | POA: Insufficient documentation

## 2012-05-13 DIAGNOSIS — E119 Type 2 diabetes mellitus without complications: Secondary | ICD-10-CM | POA: Insufficient documentation

## 2012-05-13 DIAGNOSIS — E785 Hyperlipidemia, unspecified: Secondary | ICD-10-CM | POA: Insufficient documentation

## 2012-05-13 DIAGNOSIS — Z794 Long term (current) use of insulin: Secondary | ICD-10-CM | POA: Insufficient documentation

## 2012-05-13 DIAGNOSIS — Z79899 Other long term (current) drug therapy: Secondary | ICD-10-CM | POA: Insufficient documentation

## 2012-05-13 DIAGNOSIS — R109 Unspecified abdominal pain: Secondary | ICD-10-CM | POA: Insufficient documentation

## 2012-05-13 DIAGNOSIS — N3090 Cystitis, unspecified without hematuria: Secondary | ICD-10-CM | POA: Insufficient documentation

## 2012-05-13 DIAGNOSIS — R310 Gross hematuria: Secondary | ICD-10-CM | POA: Insufficient documentation

## 2012-05-13 DIAGNOSIS — R350 Frequency of micturition: Secondary | ICD-10-CM | POA: Insufficient documentation

## 2012-05-13 LAB — URINALYSIS, ROUTINE W REFLEX MICROSCOPIC
Nitrite: POSITIVE — AB
Specific Gravity, Urine: 1.031 — ABNORMAL HIGH (ref 1.005–1.030)
Urobilinogen, UA: 1 mg/dL (ref 0.0–1.0)

## 2012-05-13 LAB — PREGNANCY, URINE: Preg Test, Ur: NEGATIVE

## 2012-05-13 LAB — URINE MICROSCOPIC-ADD ON

## 2012-05-13 MED ORDER — PHENAZOPYRIDINE HCL 200 MG PO TABS: 200.0000 mg | ORAL_TABLET | Freq: Three times a day (TID) | ORAL | Status: DC

## 2012-05-13 MED ORDER — CIPROFLOXACIN HCL 500 MG PO TABS
500.0000 mg | ORAL_TABLET | Freq: Once | ORAL | Status: AC
  Administered 2012-05-13: 500 mg via ORAL
  Filled 2012-05-13: qty 1

## 2012-05-13 MED ORDER — PHENAZOPYRIDINE HCL 100 MG PO TABS
200.0000 mg | ORAL_TABLET | Freq: Once | ORAL | Status: AC
  Administered 2012-05-13: 200 mg via ORAL
  Filled 2012-05-13: qty 2

## 2012-05-13 MED ORDER — CIPROFLOXACIN HCL 500 MG PO TABS: 500.0000 mg | ORAL_TABLET | Freq: Two times a day (BID) | ORAL | Status: DC

## 2012-05-13 NOTE — ED Notes (Signed)
Pt c/o lower abd pain with dysuria and frequent urination.

## 2012-05-13 NOTE — ED Provider Notes (Signed)
History     CSN: [id number]  Arrival date & time 05/13/12  1478   First MD Initiated Contact with Patient 05/13/12 0441      Chief Complaint  Patient presents with  . Dysuria    (Consider location/radiation/quality/duration/timing/severity/associated sxs/prior treatment) HPI This is a 37 year old female who developed urinary urgency, urinary frequency and burning with urination yesterday evening. The symptoms have worsened since and are now moderate to severe. She gets some slight relief with voiding but then the urge to urinate returns quickly. She denies fever, chills, nausea, vomiting, diarrhea, vaginal bleeding or vaginal discharge. There is associated suprapubic pain, worse with palpation or movement. She has some slight hematuria at home which became gross hematuria on arrival in the ED.  Past Medical History  Diagnosis Date  . Diabetes mellitus   . Anxiety   . Hyperlipidemia     Past Surgical History  Procedure Laterality Date  . Cesarean section    . G1 p1      Family History  Problem Relation Age of Onset  . Migraines    . Diabetes Mother   . Diabetes Father   . Diabetes Brother     History  Substance Use Topics  . Smoking status: Never Smoker   . Smokeless tobacco: Never Used  . Alcohol Use: No    OB History   Grav Para Term Preterm Abortions TAB SAB Ect Mult Living                  Review of Systems  All other systems reviewed and are negative.    Allergies  Sulfonamide derivatives  Home Medications   Current Outpatient Rx  Name  Route  Sig  Dispense  Refill  . CYMBALTA 60 MG capsule      TAKE ONE CAPSULE BY MOUTH ONCE DAILY   90 capsule   1   . Fe Asp Gly-Succ-C-Thre-B12-FA (CHROMAGEN FA) 70-150-2-1 MG TABS   Oral   Take by mouth daily.           . fenofibrate 160 MG tablet   Oral   Take 1 tablet (160 mg total) by mouth daily.   30 tablet   2   . FreeStyle Unistick II Lancets MISC   Does not apply   by Does not apply  route. Use as directed          . glucose blood (FREESTYLE LITE) test strip   Other   1 each by Other route as needed. Use as instructed before breakfast and 2-2 1/2 hours after a meal          . ibuprofen (ADVIL,MOTRIN) 200 MG tablet   Oral   Take 800 mg by mouth every 4 (four) hours as needed. pain          . Insulin Pen Needle (NOVOFINE) 32G X 6 MM MISC   Does not apply   by Does not apply route. Use as directed          . Insulin Pen Needle 32G X 6 MM MISC   Does not apply   by Does not apply route.           . metFORMIN (GLUCOPHAGE XR) 750 MG 24 hr tablet      2 po qpm   60 tablet   5   . rosuvastatin (CRESTOR) 20 MG tablet   Oral   Take 20 mg by mouth at bedtime.           Marland Kitchen  VICTOZA 18 MG/3ML SOLN injection      INJECT 1.8 MG SUBCUTANEOUSLY EVERY DAY.   9 mL   5   . fluconazole (DIFLUCAN) 150 MG tablet      TAKE ONE TABLET BY MOUTH ONCE DAILY MAY REPEAT IN 3 DAYS   2 tablet   2     BP 137/92  Pulse 100  Temp(Src) 98.2 F (36.8 C) (Oral)  Resp 18  Ht 5' 6.5" (1.689 m)  Wt 208 lb (94.348 kg)  BMI 33.07 kg/m2  SpO2 97%  LMP 04/19/2012  Physical Exam General: Well-developed, well-nourished female in no acute distress; appearance consistent with age of record HENT: normocephalic, atraumatic Eyes: pupils equal round and reactive to light; extraocular muscles intact Neck: supple Heart: regular rate and rhythm; tachycardic Lungs: clear to auscultation bilaterally Abdomen: soft; nondistended; suprapubic tenderness; no masses or hepatosplenomegaly; bowel sounds present  GU: No CVA tenderness; urine grossly bloody Extremities: No deformity; full range of motion Neurologic: Awake, alert and oriented; motor function intact in all extremities and symmetric; no facial droop Skin: Warm and dry Psychiatric: Mildly anxious    ED Course  Procedures (including critical care time)    MDM   Nursing notes and vitals signs, including pulse  oximetry, reviewed.  Summary of this visit's results, reviewed by myself:  Labs:  Results for orders placed during the hospital encounter of 05/13/12 (from the past 24 hour(s))  URINALYSIS, ROUTINE W REFLEX MICROSCOPIC     Status: Abnormal   Collection Time    05/13/12  4:33 AM      Result Value Range   Color, Urine RED (*) YELLOW   APPearance TURBID (*) CLEAR   Specific Gravity, Urine 1.031 (*) 1.005 - 1.030   pH 5.0  5.0 - 8.0   Glucose, UA 500 (*) NEGATIVE mg/dL   Hgb urine dipstick LARGE (*) NEGATIVE   Bilirubin Urine LARGE (*) NEGATIVE   Ketones, ur >80 (*) NEGATIVE mg/dL   Protein, ur >161 (*) NEGATIVE mg/dL   Urobilinogen, UA 1.0  0.0 - 1.0 mg/dL   Nitrite POSITIVE (*) NEGATIVE   Leukocytes, UA LARGE (*) NEGATIVE  PREGNANCY, URINE     Status: None   Collection Time    05/13/12  4:33 AM      Result Value Range   Preg Test, Ur NEGATIVE  NEGATIVE  URINE MICROSCOPIC-ADD ON     Status: None   Collection Time    05/13/12  4:33 AM      Result Value Range   RBC / HPF TOO NUMEROUS TO COUNT  <3 RBC/hpf   Urine-Other FIELD OBSCURED BY RBC'S              Carlisle Beers Kathee Tumlin, MD 05/13/12 [phone]

## 2012-05-15 LAB — URINE CULTURE: Colony Count: >=100000

## 2012-05-16 ENCOUNTER — Telehealth (HOSPITAL_COMMUNITY): Payer: Self-pay | Admitting: Emergency Medicine

## 2012-05-16 NOTE — ED Notes (Signed)
Patient has +Urine culture. °

## 2012-05-16 NOTE — ED Notes (Signed)
+  Urine. Patient given Cipro. Resistant. Chart sent to EDP office for review. °

## 2012-05-18 ENCOUNTER — Telehealth (HOSPITAL_COMMUNITY): Payer: Self-pay | Admitting: Emergency Medicine

## 2012-05-18 NOTE — ED Notes (Signed)
Chart return from EDP office for review with orders written by Sharilyn Sites with for Macrobid 100 mg po BID # 10.

## 2012-05-20 ENCOUNTER — Telehealth (HOSPITAL_COMMUNITY): Payer: Self-pay | Admitting: Emergency Medicine

## 2012-05-20 NOTE — ED Notes (Signed)
Unable to contact patient via phone-letter sent to [facility] address.

## 2012-06-15 ENCOUNTER — Telehealth (HOSPITAL_COMMUNITY): Payer: Self-pay | Admitting: Emergency Medicine

## 2012-06-15 NOTE — ED Notes (Signed)
No response to letter sent after 30 days. Chart sent to Medical Records. °

## 2012-07-05 ENCOUNTER — Emergency Department (HOSPITAL_BASED_OUTPATIENT_CLINIC_OR_DEPARTMENT_OTHER)
Admission: EM | Admit: 2012-07-05 | Discharge: 2012-07-06 | Disposition: A | Discharge status: Home / Self Care | Payer: Commercial Managed Care - PPO | Attending: Emergency Medicine | Admitting: Emergency Medicine

## 2012-07-05 ENCOUNTER — Encounter (HOSPITAL_BASED_OUTPATIENT_CLINIC_OR_DEPARTMENT_OTHER): Payer: Self-pay | Admitting: *Deleted

## 2012-07-05 DIAGNOSIS — Z79899 Other long term (current) drug therapy: Secondary | ICD-10-CM | POA: Insufficient documentation

## 2012-07-05 DIAGNOSIS — R300 Dysuria: Secondary | ICD-10-CM | POA: Insufficient documentation

## 2012-07-05 DIAGNOSIS — N390 Urinary tract infection, site not specified: Secondary | ICD-10-CM

## 2012-07-05 DIAGNOSIS — Z3202 Encounter for pregnancy test, result negative: Secondary | ICD-10-CM | POA: Insufficient documentation

## 2012-07-05 DIAGNOSIS — F411 Generalized anxiety disorder: Secondary | ICD-10-CM | POA: Insufficient documentation

## 2012-07-05 DIAGNOSIS — Z794 Long term (current) use of insulin: Secondary | ICD-10-CM | POA: Insufficient documentation

## 2012-07-05 DIAGNOSIS — E119 Type 2 diabetes mellitus without complications: Secondary | ICD-10-CM | POA: Insufficient documentation

## 2012-07-05 DIAGNOSIS — E785 Hyperlipidemia, unspecified: Secondary | ICD-10-CM | POA: Insufficient documentation

## 2012-07-05 NOTE — ED Notes (Signed)
C/o dysuria and urinary pressure, denies fever.

## 2012-07-05 NOTE — ED Provider Notes (Signed)
History     This chart was scribed for Cheryl Livers Smitty Cords, MD by Jiles Prows, ED Scribe. The patient was seen in room MH07/MH07 and the patient's care was started at 11:50 PM.  CSN: [id number] Arrival date & time 07/05/12  2330  Chief Complaint  Patient presents with  . Urinary Frequency   Patient is a 37 y.o. female presenting with dysuria. The history is provided by the patient and medical records. No language interpreter was used.  Dysuria Pain quality:  Burning Pain severity:  Moderate Onset quality:  Sudden Duration:  1 day Timing:  Constant Progression:  Worsening Chronicity:  New Recent urinary tract infections: yes   Relieved by:  Nothing Worsened by:  Nothing tried Urinary symptoms: frequent urination   Associated symptoms: no fever and no vomiting   Risk factors: recurrent urinary tract infections    HPI Comments: Cheryl Hart is a 37 y.o. female who presents to the Emergency Department complaining of moderate, constant pain with urination onset today. She states that this pain radiates to her back and side.  Pt reports that she was seen here a month ago for urinary issues.  She reports that she has not seen her PCP for this issue, but has been drinking lots of water.  The pain returned today.  Pt denies headache, diaphoresis, fever, chills, nausea, vomiting, diarrhea, weakness, cough, SOB and any other pain.   Past Medical History  Diagnosis Date  . Diabetes mellitus   . Anxiety   . Hyperlipidemia    Past Surgical History  Procedure Laterality Date  . Cesarean section    . G1 p1     Family History  Problem Relation Age of Onset  . Migraines    . Diabetes Mother   . Diabetes Father   . Diabetes Brother    History  Substance Use Topics  . Smoking status: Never Smoker   . Smokeless tobacco: Never Used  . Alcohol Use: No   OB History   Grav Para Term Preterm Abortions TAB SAB Ect Mult Living                 Review of Systems  Constitutional:  Negative for fever.  Gastrointestinal: Negative for vomiting.  Genitourinary: Positive for dysuria and frequency.  All other systems reviewed and are negative.    Allergies  Sulfonamide derivatives  Home Medications   Current Outpatient Rx  Name  Route  Sig  Dispense  Refill  . CYMBALTA 60 MG capsule      TAKE ONE CAPSULE BY MOUTH ONCE DAILY   90 capsule   1   . fenofibrate 160 MG tablet   Oral   Take 1 tablet (160 mg total) by mouth daily.   30 tablet   2   . Insulin Pen Needle (NOVOFINE) 32G X 6 MM MISC   Does not apply   by Does not apply route. Use as directed          . metFORMIN (GLUCOPHAGE XR) 750 MG 24 hr tablet      2 po qpm   60 tablet   5   . VICTOZA 18 MG/3ML SOLN injection      INJECT 1.8 MG SUBCUTANEOUSLY EVERY DAY.   9 mL   5   . ciprofloxacin (CIPRO) 500 MG tablet   Oral   Take 1 tablet (500 mg total) by mouth 2 (two) times daily. One po bid x 7 days   14 tablet   0   .  Fe Asp Gly-Succ-C-Thre-B12-FA (CHROMAGEN FA) 70-150-2-1 MG TABS   Oral   Take by mouth daily.           . fluconazole (DIFLUCAN) 150 MG tablet      TAKE ONE TABLET BY MOUTH ONCE DAILY MAY REPEAT IN 3 DAYS   2 tablet   2   . FreeStyle Unistick II Lancets MISC   Does not apply   by Does not apply route. Use as directed          . glucose blood (FREESTYLE LITE) test strip   Other   1 each by Other route as needed. Use as instructed before breakfast and 2-2 1/2 hours after a meal          . ibuprofen (ADVIL,MOTRIN) 200 MG tablet   Oral   Take 800 mg by mouth every 4 (four) hours as needed. pain          . Insulin Pen Needle 32G X 6 MM MISC   Does not apply   by Does not apply route.           . phenazopyridine (PYRIDIUM) 200 MG tablet   Oral   Take 1 tablet (200 mg total) by mouth 3 (three) times daily.   6 tablet   0   . rosuvastatin (CRESTOR) 20 MG tablet   Oral   Take 20 mg by mouth at bedtime.            BP 125/86  Pulse 109  Temp(Src)  98 F (36.7 C) (Oral)  Resp 20  Ht 5\' 7"  (1.702 m)  Wt 206 lb (93.441 kg)  BMI 32.26 kg/m2  SpO2 100%  LMP 06/21/2012 Physical Exam  Nursing note and vitals reviewed. Constitutional: She is oriented to person, place, and time. She appears well-developed and well-nourished. No distress.  HENT:  Head: Normocephalic and atraumatic.  Mouth/Throat: Oropharynx is clear and moist.  Eyes: EOM are normal. Pupils are equal, round, and reactive to light.  Neck: Normal range of motion. Neck supple. No tracheal deviation present.  Cardiovascular: Normal rate, regular rhythm and normal heart sounds.   Pulmonary/Chest: Effort normal and breath sounds normal. No respiratory distress. She has no wheezes. She has no rales.  Abdominal: Soft. Bowel sounds are normal. She exhibits no distension and no mass. There is no tenderness. There is no rebound and no guarding.  Musculoskeletal: Normal range of motion.  Neurological: She is alert and oriented to person, place, and time.  Skin: Skin is warm and dry.  Psychiatric: She has a normal mood and affect. Her behavior is normal.    ED Course  Procedures (including critical care time) DIAGNOSTIC STUDIES: Oxygen Saturation is 100% on RA, normal by my interpretation.    COORDINATION OF CARE: 11:53 PM - Discussed ED treatment with pt at bedside including urinalysis and pt agrees.  12:09 AM - Recheck.   Labs Reviewed  URINALYSIS, ROUTINE W REFLEX MICROSCOPIC - Abnormal; Notable for the following:    APPearance CLOUDY (*)    Specific Gravity, Urine 1.038 (*)    Glucose, UA >1000 (*)    Bilirubin Urine SMALL (*)    Ketones, ur 15 (*)    Protein, ur 30 (*)    Leukocytes, UA MODERATE (*)    All other components within normal limits  URINE MICROSCOPIC-ADD ON - Abnormal; Notable for the following:    Squamous Epithelial / LPF MANY (*)    Bacteria, UA MANY (*)    All  other components within normal limits  URINE CULTURE  PREGNANCY, URINE   No results  found. No diagnosis found.  MDM  Will treat with Keflex and pyridium based on previous culture results.  Follow up with family doctor for recheck.  Patient verbalizes understanding and agrees to follow up.    I personally performed the services described in this documentation, which was scribed in my presence. The recorded information has been reviewed and is accurate.     Jasmine Awe, MD 07/06/12 [phone]

## 2012-07-06 LAB — URINE MICROSCOPIC-ADD ON

## 2012-07-06 LAB — URINALYSIS, ROUTINE W REFLEX MICROSCOPIC
Glucose, UA: >1000 mg/dL — AB
Ketones, ur: 15 mg/dL — AB
pH: 5.5 (ref 5.0–8.0)

## 2012-07-06 MED ORDER — CEFTRIAXONE SODIUM 1 G IJ SOLR
1.0000 g | Freq: Once | INTRAMUSCULAR | Status: AC
  Administered 2012-07-06: 1 g via INTRAMUSCULAR
  Filled 2012-07-06: qty 10

## 2012-07-06 MED ORDER — CEPHALEXIN 500 MG PO CAPS: 500.0000 mg | ORAL_CAPSULE | Freq: Four times a day (QID) | ORAL | Status: DC

## 2012-07-06 MED ORDER — LIDOCAINE HCL (PF) 1 % IJ SOLN
INTRAMUSCULAR | Status: AC
  Administered 2012-07-06: 5 mL
  Filled 2012-07-06: qty 5

## 2012-07-06 MED ORDER — PHENAZOPYRIDINE HCL 200 MG PO TABS: 200.0000 mg | ORAL_TABLET | Freq: Three times a day (TID) | ORAL | Status: DC

## 2012-07-08 LAB — URINE CULTURE

## 2012-07-09 NOTE — ED Notes (Signed)
Post ED Visit - Positive Culture Follow-up  Culture report reviewed by antimicrobial stewardship pharmacist: []  Wes Dulaney, Pharm.D., BCPS [x]  Celedonio Miyamoto, Pharm.D., BCPS []  Georgina Pillion, Pharm.D., BCPS []  [place], [address].D., BCPS, AAHIVP []  Estella Husk, Pharm.D., BCPS, AAHIVP  Positive urine culture Treated with  Cephalexin, organism sensitive to the same and no further patient follow-up is required at this time.  Larena Sox 07/09/2012, 2:18 PM

## 2012-07-22 ENCOUNTER — Other Ambulatory Visit: Payer: Self-pay

## 2012-08-03 ENCOUNTER — Other Ambulatory Visit: Payer: Self-pay | Admitting: Family Medicine

## 2012-08-27 ENCOUNTER — Ambulatory Visit: Payer: Commercial Managed Care - PPO | Admitting: Family Medicine

## 2012-09-30 ENCOUNTER — Other Ambulatory Visit: Payer: Self-pay | Admitting: Family Medicine

## 2012-11-18 ENCOUNTER — Other Ambulatory Visit: Payer: Self-pay

## 2012-12-03 ENCOUNTER — Other Ambulatory Visit: Payer: Self-pay | Admitting: Family Medicine

## 2013-01-17 ENCOUNTER — Other Ambulatory Visit: Payer: Self-pay | Admitting: Family Medicine

## 2013-02-21 ENCOUNTER — Ambulatory Visit (INDEPENDENT_AMBULATORY_CARE_PROVIDER_SITE_OTHER): Payer: Commercial Managed Care - PPO | Admitting: Family Medicine

## 2013-02-21 ENCOUNTER — Telehealth: Payer: Self-pay

## 2013-02-21 ENCOUNTER — Encounter: Payer: Self-pay | Admitting: Family Medicine

## 2013-02-21 VITALS — BP 122/79 | HR 114 | Temp 98.4°F | Wt 209.0 lb

## 2013-02-21 DIAGNOSIS — IMO0001 Reserved for inherently not codable concepts without codable children: Secondary | ICD-10-CM

## 2013-02-21 DIAGNOSIS — E1165 Type 2 diabetes mellitus with hyperglycemia: Secondary | ICD-10-CM

## 2013-02-21 DIAGNOSIS — E669 Obesity, unspecified: Secondary | ICD-10-CM | POA: Insufficient documentation

## 2013-02-21 DIAGNOSIS — D509 Iron deficiency anemia, unspecified: Secondary | ICD-10-CM

## 2013-02-21 DIAGNOSIS — I10 Essential (primary) hypertension: Secondary | ICD-10-CM

## 2013-02-21 DIAGNOSIS — D649 Anemia, unspecified: Secondary | ICD-10-CM

## 2013-02-21 DIAGNOSIS — E785 Hyperlipidemia, unspecified: Secondary | ICD-10-CM

## 2013-02-21 DIAGNOSIS — IMO0002 Reserved for concepts with insufficient information to code with codable children: Secondary | ICD-10-CM

## 2013-02-21 LAB — FERRITIN: Ferritin: 6.3 ng/mL — ABNORMAL LOW (ref 10.0–291.0)

## 2013-02-21 LAB — CBC WITH DIFFERENTIAL/PLATELET
BASOS ABS: 0 10*3/uL (ref 0.0–0.1)
Basophils Relative: 0.7 % (ref 0.0–3.0)
Eosinophils Absolute: 0.1 10*3/uL (ref 0.0–0.7)
Eosinophils Relative: 1.7 % (ref 0.0–5.0)
HEMATOCRIT: 26.3 % — AB (ref 36.0–46.0)
Hemoglobin: 7.8 g/dL — CL (ref 12.0–15.0)
LYMPHS ABS: 2.7 10*3/uL (ref 0.7–4.0)
Lymphocytes Relative: 41.8 % (ref 12.0–46.0)
MCHC: 30 g/dL (ref 30.0–36.0)
MCV: 63.8 fl — AB (ref 78.0–100.0)
MONO ABS: 0.4 10*3/uL (ref 0.1–1.0)
MONOS PCT: 6.4 % (ref 3.0–12.0)
NEUTROS PCT: 49.4 % (ref 43.0–77.0)
Neutro Abs: 3.2 10*3/uL (ref 1.4–7.7)
PLATELETS: 445 10*3/uL — AB (ref 150.0–400.0)
RBC: 4.13 Mil/uL (ref 3.87–5.11)
RDW: 19.9 % — AB (ref 11.5–14.6)
WBC: 6.5 10*3/uL (ref 4.5–10.5)

## 2013-02-21 LAB — POCT URINALYSIS DIPSTICK
Bilirubin, UA: NEGATIVE
GLUCOSE UA: NEGATIVE
Ketones, UA: NEGATIVE
Leukocytes, UA: NEGATIVE
NITRITE UA: NEGATIVE
Protein, UA: NEGATIVE
RBC UA: NEGATIVE
Spec Grav, UA: 1.01
UROBILINOGEN UA: 0.2
pH, UA: 7

## 2013-02-21 LAB — BASIC METABOLIC PANEL
BUN: 10 mg/dL (ref 6–23)
CHLORIDE: 103 meq/L (ref 96–112)
CO2: 22 meq/L (ref 19–32)
CREATININE: 0.6 mg/dL (ref 0.4–1.2)
Calcium: 9.3 mg/dL (ref 8.4–10.5)
GFR: 145.32 mL/min (ref >60.00)
Glucose, Bld: 175 mg/dL — ABNORMAL HIGH (ref 70–99)
Potassium: 3.8 mEq/L (ref 3.5–5.1)
Sodium: 133 mEq/L — ABNORMAL LOW (ref 135–145)

## 2013-02-21 LAB — LIPID PANEL
CHOLESTEROL: 196 mg/dL (ref 0–200)
HDL: 37.1 mg/dL — ABNORMAL LOW (ref >39.00)
LDL Cholesterol: 133 mg/dL — ABNORMAL HIGH (ref 0–99)
Total CHOL/HDL Ratio: 5
Triglycerides: 129 mg/dL (ref 0.0–149.0)
VLDL: 25.8 mg/dL (ref 0.0–40.0)

## 2013-02-21 LAB — IBC PANEL
IRON: 23 ug/dL — AB (ref 42–145)
Saturation Ratios: 5 % — ABNORMAL LOW (ref 20.0–50.0)
TRANSFERRIN: 331.7 mg/dL (ref 212.0–360.0)

## 2013-02-21 LAB — HEPATIC FUNCTION PANEL
ALBUMIN: 3.7 g/dL (ref 3.5–5.2)
ALT: 32 U/L (ref 0–35)
AST: 22 U/L (ref 0–37)
Alkaline Phosphatase: 113 U/L (ref 39–117)
Bilirubin, Direct: 0 mg/dL (ref 0.0–0.3)
TOTAL PROTEIN: 8.3 g/dL (ref 6.0–8.3)
Total Bilirubin: 0.4 mg/dL (ref 0.3–1.2)

## 2013-02-21 LAB — VITAMIN B12: Vitamin B-12: 399 pg/mL (ref 211–911)

## 2013-02-21 LAB — MICROALBUMIN / CREATININE URINE RATIO
CREATININE, U: 131.5 mg/dL
MICROALB/CREAT RATIO: 0.7 mg/g (ref 0.0–30.0)
Microalb, Ur: 0.9 mg/dL (ref 0.0–1.9)

## 2013-02-21 LAB — HEMOGLOBIN A1C: Hgb A1c MFr Bld: 9.3 % — ABNORMAL HIGH (ref 4.6–6.5)

## 2013-02-21 MED ORDER — METFORMIN HCL ER 750 MG PO TB24: ORAL_TABLET | ORAL | Status: DC

## 2013-02-21 MED ORDER — FERRALET 90 90-1 MG PO TABS: 90.0000 mg | ORAL_TABLET | Freq: Every day | ORAL | Status: DC

## 2013-02-21 MED ORDER — LIRAGLUTIDE 18 MG/3ML ~~LOC~~ SOPN: PEN_INJECTOR | SUBCUTANEOUS | Status: DC

## 2013-02-21 NOTE — Progress Notes (Signed)
Pre visit review using our clinic review tool, if applicable. No additional management support is needed unless otherwise documented below in the visit note. 

## 2013-02-21 NOTE — Progress Notes (Signed)
Patient ID: Cheryl Hart, female   DOB: 1975/08/02, 38 y.o.   MRN: [id number]   Subjective:    Patient ID: Cheryl Hart, female    DOB: December 20, 1975, 38 y.o.   MRN: [id number] HPI Pt here for f/u dm, htn, and hyperlipidemia and labs from [facility].  Pt was found to be anemic.  Results not currently available.  Pt denies sob, cp, fatigue, dizziness.    HYPERTENSION  Blood pressure range-not checking  Chest pain- no      Dyspnea- no Lightheadedness- no   Edema- no Other side effects - no   Medication compliance: good Low salt diet- yes  DIABETES  Blood Sugar ranges-not checking regularly  Polyuria- no New Visual problems- no Hypoglycemic symptoms- no Other side effects-no Medication compliance - good Last eye exam- due Foot exam- today  HYPERLIPIDEMIA  Medication compliance- good  RUQ pain- no  Muscle aches- no Other side effects-no  ROS See HPI above   PMH Smoking Status noted             Objective:    BP 122/79  Pulse 114  Temp(Src) 98.4 F (36.9 C) (Oral)  Wt 209 lb (94.802 kg)  SpO2 100% General appearance: alert, cooperative, appears stated age and no distress Neck: no adenopathy, no carotid bruit, supple, symmetrical, trachea midline and thyroid not enlarged, symmetric, no tenderness/mass/nodules Lungs: clear to auscultation bilaterally Heart: S1, S2 normal Extremities: extremities normal, atraumatic, no cyanosis or edema Lymph nodes: Cervical, supraclavicular, and axillary nodes normal.       Assessment & Plan:  1. Anemia Labs scanned in from  [facility]. Results not available-- repeat today - CBC with Differential - IBC panel - Ferritin - B12  2. Diabetes mellitus type II, uncontrolled Check labs. con't meds - Hemoglobin A1c - Hepatic function panel - Lipid panel - Microalbumin / creatinine urine ratio - POCT urinalysis dipstick - metFORMIN (GLUCOPHAGE-XR) 750 MG 24 hr tablet; TAKE 2 TABLETS BY MOUTH EVERY EVENING  Dispense: 60  tablet; Refill: 5 - Liraglutide (VICTOZA) 18 MG/3ML SOPN; Inject 1.8 mg subq daily-  Dispense: 9 mL; Refill: 5  3. Other and unspecified hyperlipidemia Cont meds Check labs - Hepatic function panel  4. HTN (hypertension) stable - Basic metabolic panel - Hepatic function panel

## 2013-02-21 NOTE — Telephone Encounter (Signed)
ferralet 90 1 po qd ----give sample with discount card #90  3 refills

## 2013-02-21 NOTE — Telephone Encounter (Signed)
Critical HGB at 7.8 and Hematocrit 26.3.    Please advise          KP

## 2013-02-21 NOTE — Telephone Encounter (Signed)
Spoke with pt and made aware of Hgb and Hct levels being very low. Advised that Dr. Etter Sjogren would like her to start [facility], Rx also sent to the pts pharmacy. Discount card also provided.   Lot # [id number] Exp 08/2013

## 2013-02-21 NOTE — Patient Instructions (Signed)

## 2013-02-22 ENCOUNTER — Telehealth: Payer: Self-pay | Admitting: Family Medicine

## 2013-02-22 ENCOUNTER — Other Ambulatory Visit: Payer: Self-pay

## 2013-02-22 DIAGNOSIS — E119 Type 2 diabetes mellitus without complications: Secondary | ICD-10-CM

## 2013-02-22 NOTE — Telephone Encounter (Signed)
Relevant patient education assigned to patient using Emmi. ° °

## 2013-02-24 ENCOUNTER — Telehealth: Payer: Self-pay

## 2013-02-24 NOTE — Telephone Encounter (Signed)
Relevant patient education assigned to patient using Emmi. ° °

## 2013-04-27 ENCOUNTER — Other Ambulatory Visit: Payer: Self-pay | Admitting: Family Medicine

## 2013-08-09 ENCOUNTER — Telehealth: Payer: Self-pay

## 2013-08-09 NOTE — Telephone Encounter (Signed)
LM for pt to call back and schedule CPE with PCP.

## 2013-09-23 ENCOUNTER — Other Ambulatory Visit: Payer: Self-pay | Admitting: Family Medicine

## 2013-10-07 ENCOUNTER — Other Ambulatory Visit: Payer: Self-pay | Admitting: Family Medicine

## 2013-10-07 NOTE — Telephone Encounter (Signed)
Called and left a detailed message making patient aware that medication has been refilled and that an office visit needs to be scheduled.

## 2013-10-14 ENCOUNTER — Other Ambulatory Visit: Payer: Self-pay | Admitting: Family Medicine

## 2013-10-14 ENCOUNTER — Telehealth: Payer: Self-pay | Admitting: *Deleted

## 2013-10-14 NOTE — Telephone Encounter (Signed)
Pt requesting diflucan 150mg   Last OV- 02/21/13 Last refilled- 02/21/13 #2 / 2 rf  Okay to refill??

## 2013-10-14 NOTE — Telephone Encounter (Signed)
Last seen 02/21/13 and filled 10/01/12 #2 with 2 refills. Please advise     KP

## 2013-10-14 NOTE — Telephone Encounter (Signed)
Ok to refill 

## 2013-11-04 ENCOUNTER — Other Ambulatory Visit: Payer: Self-pay | Admitting: Family Medicine

## 2013-12-06 ENCOUNTER — Other Ambulatory Visit: Payer: Self-pay | Admitting: Family Medicine

## 2013-12-20 ENCOUNTER — Encounter: Payer: Commercial Managed Care - PPO | Admitting: Family Medicine

## 2014-01-17 ENCOUNTER — Encounter: Payer: Commercial Managed Care - PPO | Admitting: Family Medicine

## 2014-01-19 ENCOUNTER — Other Ambulatory Visit: Payer: Self-pay | Admitting: *Deleted

## 2014-01-19 MED ORDER — METFORMIN HCL ER 750 MG PO TB24: ORAL_TABLET | ORAL | Status: DC

## 2014-01-19 NOTE — Telephone Encounter (Signed)
Rx for Metformin refilled for 60 days.  Patient has upcoming appointment scheduled for 03/03/14.       EAL

## 2014-02-16 ENCOUNTER — Other Ambulatory Visit: Payer: Self-pay | Admitting: Family Medicine

## 2014-03-03 ENCOUNTER — Ambulatory Visit (INDEPENDENT_AMBULATORY_CARE_PROVIDER_SITE_OTHER): Payer: Commercial Managed Care - PPO | Admitting: Family Medicine

## 2014-03-03 ENCOUNTER — Encounter: Payer: Self-pay | Admitting: Family Medicine

## 2014-03-03 ENCOUNTER — Other Ambulatory Visit (HOSPITAL_COMMUNITY)
Admission: RE | Admit: 2014-03-03 | Discharge: 2014-03-03 | Disposition: A | Discharge status: Home / Self Care | Payer: Commercial Managed Care - PPO | Source: Ambulatory Visit | Attending: Family Medicine | Admitting: Family Medicine

## 2014-03-03 VITALS — BP 123/85 | HR 113 | Temp 98.3°F | Ht 66.5 in | Wt 208.4 lb

## 2014-03-03 DIAGNOSIS — IMO0002 Reserved for concepts with insufficient information to code with codable children: Secondary | ICD-10-CM

## 2014-03-03 DIAGNOSIS — Z1151 Encounter for screening for human papillomavirus (HPV): Secondary | ICD-10-CM | POA: Insufficient documentation

## 2014-03-03 DIAGNOSIS — Z01419 Encounter for gynecological examination (general) (routine) without abnormal findings: Secondary | ICD-10-CM | POA: Diagnosis present

## 2014-03-03 DIAGNOSIS — Z0000 Encounter for general adult medical examination without abnormal findings: Secondary | ICD-10-CM

## 2014-03-03 DIAGNOSIS — E1165 Type 2 diabetes mellitus with hyperglycemia: Secondary | ICD-10-CM

## 2014-03-03 DIAGNOSIS — N760 Acute vaginitis: Secondary | ICD-10-CM

## 2014-03-03 DIAGNOSIS — B354 Tinea corporis: Secondary | ICD-10-CM

## 2014-03-03 DIAGNOSIS — Z124 Encounter for screening for malignant neoplasm of cervix: Secondary | ICD-10-CM

## 2014-03-03 LAB — LIPID PANEL
Cholesterol: 203 mg/dL — ABNORMAL HIGH (ref 0–200)
HDL: 38.9 mg/dL — AB (ref >39.00)
LDL Cholesterol: 138 mg/dL — ABNORMAL HIGH (ref 0–99)
NonHDL: 164.1
Total CHOL/HDL Ratio: 5
Triglycerides: 133 mg/dL (ref 0.0–149.0)
VLDL: 26.6 mg/dL (ref 0.0–40.0)

## 2014-03-03 LAB — BASIC METABOLIC PANEL
BUN: 12 mg/dL (ref 6–23)
CO2: 28 meq/L (ref 19–32)
Calcium: 10.2 mg/dL (ref 8.4–10.5)
Chloride: 97 mEq/L (ref 96–112)
Creatinine, Ser: 0.59 mg/dL (ref 0.40–1.20)
GFR: 144.57 mL/min (ref >60.00)
GLUCOSE: 270 mg/dL — AB (ref 70–99)
POTASSIUM: 3.7 meq/L (ref 3.5–5.1)
SODIUM: 133 meq/L — AB (ref 135–145)

## 2014-03-03 LAB — HEPATIC FUNCTION PANEL
ALK PHOS: 141 U/L — AB (ref 39–117)
ALT: 41 U/L — ABNORMAL HIGH (ref 0–35)
AST: 31 U/L (ref 0–37)
Albumin: 4.2 g/dL (ref 3.5–5.2)
Bilirubin, Direct: 0 mg/dL (ref 0.0–0.3)
Total Bilirubin: 0.2 mg/dL (ref 0.2–1.2)
Total Protein: 8.3 g/dL (ref 6.0–8.3)

## 2014-03-03 LAB — CBC WITH DIFFERENTIAL/PLATELET
BASOS PCT: 0.6 % (ref 0.0–3.0)
Basophils Absolute: 0.1 10*3/uL (ref 0.0–0.1)
EOS ABS: 0.1 10*3/uL (ref 0.0–0.7)
Eosinophils Relative: 1.3 % (ref 0.0–5.0)
HCT: 29.7 % — ABNORMAL LOW (ref 36.0–46.0)
HEMOGLOBIN: 9.4 g/dL — AB (ref 12.0–15.0)
Lymphocytes Relative: 33.3 % (ref 12.0–46.0)
Lymphs Abs: 3 10*3/uL (ref 0.7–4.0)
MCHC: 31.6 g/dL (ref 30.0–36.0)
Monocytes Absolute: 0.6 10*3/uL (ref 0.1–1.0)
Monocytes Relative: 6.4 % (ref 3.0–12.0)
NEUTROS ABS: 5.3 10*3/uL (ref 1.4–7.7)
Neutrophils Relative %: 58.4 % (ref 43.0–77.0)
Platelets: 379 10*3/uL (ref 150.0–400.0)
RBC: 4.36 Mil/uL (ref 3.87–5.11)
RDW: 18.4 % — ABNORMAL HIGH (ref 11.5–15.5)
WBC: 9 10*3/uL (ref 4.0–10.5)

## 2014-03-03 LAB — TSH: TSH: 2.81 u[IU]/mL (ref 0.35–4.50)

## 2014-03-03 LAB — POCT URINALYSIS DIPSTICK
Bilirubin, UA: NEGATIVE
Leukocytes, UA: NEGATIVE
NITRITE UA: NEGATIVE
RBC UA: NEGATIVE
Spec Grav, UA: >=1.03
UROBILINOGEN UA: 0.2
pH, UA: 5.5

## 2014-03-03 LAB — MICROALBUMIN / CREATININE URINE RATIO
Creatinine,U: 101 mg/dL
MICROALB UR: 6.5 mg/dL — AB (ref 0.0–1.9)
Microalb Creat Ratio: 6.4 mg/g (ref 0.0–30.0)

## 2014-03-03 LAB — HEMOGLOBIN A1C: HEMOGLOBIN A1C: 10.3 % — AB (ref 4.6–6.5)

## 2014-03-03 MED ORDER — NYSTATIN 100000 UNIT/GM EX POWD: Freq: Four times a day (QID) | CUTANEOUS | Status: DC

## 2014-03-03 MED ORDER — FLUCONAZOLE 150 MG PO TABS: 150.0000 mg | ORAL_TABLET | Freq: Once | ORAL | Status: DC

## 2014-03-03 NOTE — Progress Notes (Signed)
Subjective:     Cheryl Hart is a 39 y.o. female and is here for a comprehensive physical exam. The patient reports problems - rash around c section scar that itches and burns.  History   Social History  . Marital Status: Single    Spouse Name: N/A  . Number of Children: N/A  . Years of Education: N/A   Occupational History  . psych tech [employer]   Social History Main Topics  . Smoking status: Never Smoker   . Smokeless tobacco: Never Used  . Alcohol Use: No  . Drug Use: No  . Sexual Activity:    Partners: Male    Birth Control/ Protection: Pill   Other Topics Concern  . Not on file   Social History Narrative   Exercise-- no   Health Maintenance  Topic Date Due  . FOOT EXAM  03/18/2013  . OPHTHALMOLOGY EXAM  08/13/2013  . HEMOGLOBIN A1C  08/21/2013  . URINE MICROALBUMIN  02/21/2014  . INFLUENZA VACCINE  08/14/2014  . PAP SMEAR  03/19/2015  . PNEUMOCOCCAL POLYSACCHARIDE VACCINE (2) 03/18/2017  . TETANUS/TDAP  02/15/2020    The following portions of the patient's history were reviewed and updated as appropriate:  She  has a past medical history of Diabetes mellitus; Anxiety; and Hyperlipidemia. She  does not have any pertinent problems on file. She  has past surgical history that includes Cesarean section and g1 p1. Her family history includes Diabetes in her brother, father, and mother; Migraines in an other family member. She  reports that she has never smoked. She has never used smokeless tobacco. She reports that she does not drink alcohol or use illicit drugs. She has a current medication list which includes the following prescription(s): duloxetine, ferralet 90, freestyle unistick ii lancets, glucose blood, ibuprofen, insulin pen needle, insulin pen needle, metformin, and victoza. Current Outpatient Prescriptions on File Prior to Visit  Medication Sig Dispense Refill  . DULoxetine (CYMBALTA) 60 MG capsule TAKE ONE CAPSULE BY MOUTH ONCE  DAILY 90 capsule 0  . Fe Cbn-Fe Gluc-FA-B12-C-DSS (FERRALET 90) 90-1 MG TABS Take 90 mg by mouth daily. 90 each 3  . FreeStyle Unistick II Lancets MISC by Does not apply route. Use as directed     . glucose blood (FREESTYLE LITE) test strip 1 each by Other route as needed. Use as instructed before breakfast and 2-2 1/2 hours after a meal     . ibuprofen (ADVIL,MOTRIN) 200 MG tablet Take 800 mg by mouth every 4 (four) hours as needed. pain     . Insulin Pen Needle (NOVOFINE) 32G X 6 MM MISC by Does not apply route. Use as directed     . Insulin Pen Needle 32G X 6 MM MISC by Does not apply route.      . metFORMIN (GLUCOPHAGE-XR) 750 MG 24 hr tablet TAKE 2 TABLETS BY MOUTH EVERY EVENING--REPEAT LABS ARE DUE 60 tablet 0  . VICTOZA 18 MG/3ML SOPN INJECT 1.8 MG SUBCUTANEOUSLY DAILY 9 mL 0   No current facility-administered medications on file prior to visit.   She is allergic to sulfonamide derivatives..  Review of Systems Review of Systems  Constitutional: Negative for activity change, appetite change and fatigue.  HENT: Negative for hearing loss, congestion, tinnitus and ear discharge.  dentist--- due Eyes: Negative for visual disturbance (see optho - due) Respiratory: Negative for cough, chest tightness and shortness of breath.   Cardiovascular: Negative for chest pain, palpitations and leg swelling.  Gastrointestinal:  Negative for abdominal pain, diarrhea, constipation and abdominal distention.  Genitourinary: Negative for urgency, frequency, decreased urine volume and difficulty urinating.  Musculoskeletal: Negative for back pain, arthralgias and gait problem.  Skin: Negative for color change, pallor ---- + rash low abd Neurological: Negative for dizziness, light-headedness, numbness and headaches.  Hematological: Negative for adenopathy. Does not bruise/bleed easily.  Psychiatric/Behavioral: Negative for suicidal ideas, confusion, sleep disturbance, self-injury, dysphoric mood, decreased  concentration and agitation.       Objective:    BP 123/85 mmHg  Pulse 113  Temp(Src) 98.3 F (36.8 C) (Oral)  Ht 5' 6.5" (1.689 m)  Wt 208 lb 6.4 oz (94.53 kg)  BMI 33.14 kg/m2  SpO2 97%  LMP 02/15/2014 General appearance: alert, cooperative, appears stated age and no distress Head: Normocephalic, without obvious abnormality, atraumatic Eyes: conjunctivae/corneas clear. PERRL, EOM's intact. Fundi benign. Ears: normal TM's and external ear canals both ears Nose: Nares normal. Septum midline. Mucosa normal. No drainage or sinus tenderness. Throat: lips, mucosa, and tongue normal; teeth and gums normal Neck: no adenopathy, no carotid bruit, no JVD, supple, symmetrical, trachea midline and thyroid not enlarged, symmetric, no tenderness/mass/nodules Back: symmetric, no curvature. ROM normal. No CVA tenderness. Lungs: clear to auscultation bilaterally Breasts: normal appearance, no masses or tenderness Heart: regular rate and rhythm, S1, S2 normal, no murmur, click, rub or gallop Abdomen: soft, non-tender; bowel sounds normal; no masses,  no organomegaly Pelvic: cervix normal in appearance, external genitalia normal, no adnexal masses or tenderness, no cervical motion tenderness, rectovaginal septum normal, uterus normal size, shape, and consistency, vagina normal without discharge and pap done Extremities: extremities normal, atraumatic, no cyanosis or edema Pulses: 2+ and symmetric Skin: Skin color, texture, turgor normal. No rashes or lesions Lymph nodes: Cervical, supraclavicular, and axillary nodes normal. Neurologic: Alert and oriented X 3, normal strength and tone. Normal symmetric reflexes. Normal coordination and gait Psych--no depression, no anxiety      Assessment:    Healthy female exam.       Plan:    ghm utd Check labs See After Visit Summary for Counseling Recommendations    1. Diabetes mellitus type II, uncontrolled con't metformin, victoza - Ambulatory  referral to Endocrinology - Basic metabolic panel - CBC with Differential/Platelet - Hemoglobin A1c - Hepatic function panel - Lipid panel - Microalbumin / creatinine urine ratio - POCT urinalysis dipstick - TSH  2. Preventative health care   - Basic metabolic panel - CBC with Differential/Platelet - Hemoglobin A1c - Hepatic function panel - Lipid panel - Microalbumin / creatinine urine ratio - POCT urinalysis dipstick - TSH  3. Tinea corporis   - nystatin (MYCOSTATIN) powder; Apply topically 4 (four) times daily.  Dispense: 15 g; Refill: 0  4. Vaginitis and vulvovaginitis   - fluconazole (DIFLUCAN) 150 MG tablet; Take 1 tablet (150 mg total) by mouth once. May repeat in 3 days prn  Dispense: 2 tablet; Refill: 2  5. Screening for cervical cancer   - Cytology - PAP

## 2014-03-03 NOTE — Patient Instructions (Signed)
Preventive Care for Adults A healthy lifestyle and preventive care can promote health and wellness. Preventive health guidelines for women include the following key practices.  A routine yearly physical is a good way to check with your health care provider about your health and preventive screening. It is a chance to share any concerns and updates on your health and to receive a thorough exam.  Visit your dentist for a routine exam and preventive care every 6 months. Brush your teeth twice a day and floss once a day. Good oral hygiene prevents tooth decay and gum disease.  The frequency of eye exams is based on your age, health, family medical history, use of contact lenses, and other factors. Follow your health care provider's recommendations for frequency of eye exams.  Eat a healthy diet. Foods like vegetables, fruits, whole grains, low-fat dairy products, and lean protein foods contain the nutrients you need without too many calories. Decrease your intake of foods high in solid fats, added sugars, and salt. Eat the right amount of calories for you.Get information about a proper diet from your health care provider, if necessary.  Regular physical exercise is one of the most important things you can do for your health. Most adults should get at least 150 minutes of moderate-intensity exercise (any activity that increases your heart rate and causes you to sweat) each week. In addition, most adults need muscle-strengthening exercises on 2 or more days a week.  Maintain a healthy weight. The body mass index (BMI) is a screening tool to identify possible weight problems. It provides an estimate of body fat based on height and weight. Your health care provider can find your BMI and can help you achieve or maintain a healthy weight.For adults 20 years and older:  A BMI below 18.5 is considered underweight.  A BMI of 18.5 to 24.9 is normal.  A BMI of 25 to 29.9 is considered overweight.  A BMI of  30 and above is considered obese.  Maintain normal blood lipids and cholesterol levels by exercising and minimizing your intake of saturated fat. Eat a balanced diet with plenty of fruit and vegetables. Blood tests for lipids and cholesterol should begin at age 76 and be repeated every 5 years. If your lipid or cholesterol levels are high, you are over 50, or you are at high risk for heart disease, you may need your cholesterol levels checked more frequently.Ongoing high lipid and cholesterol levels should be treated with medicines if diet and exercise are not working.  If you smoke, find out from your health care provider how to quit. If you do not use tobacco, do not start.  Lung cancer screening is recommended for adults aged 22-80 years who are at high risk for developing lung cancer because of a history of smoking. A yearly low-dose CT scan of the lungs is recommended for people who have at least a 30-pack-year history of smoking and are a current smoker or have quit within the past 15 years. A pack year of smoking is smoking an average of 1 pack of cigarettes a day for 1 year (for example: 1 pack a day for 30 years or 2 packs a day for 15 years). Yearly screening should continue until the smoker has stopped smoking for at least 15 years. Yearly screening should be stopped for people who develop a health problem that would prevent them from having lung cancer treatment.  If you are pregnant, do not drink alcohol. If you are breastfeeding,  be very cautious about drinking alcohol. If you are not pregnant and choose to drink alcohol, do not have more than 1 drink per day. One drink is considered to be 12 ounces (355 mL) of beer, 5 ounces (148 mL) of wine, or 1.5 ounces (44 mL) of liquor.  Avoid use of street drugs. Do not share needles with anyone. Ask for help if you need support or instructions about stopping the use of drugs.  High blood pressure causes heart disease and increases the risk of  stroke. Your blood pressure should be checked at least every 1 to 2 years. Ongoing high blood pressure should be treated with medicines if weight loss and exercise do not work.  If you are 3-86 years old, ask your health care provider if you should take aspirin to prevent strokes.  Diabetes screening involves taking a blood sample to check your fasting blood sugar level. This should be done once every 3 years, after age 67, if you are within normal weight and without risk factors for diabetes. Testing should be considered at a younger age or be carried out more frequently if you are overweight and have at least 1 risk factor for diabetes.  Breast cancer screening is essential preventive care for women. You should practice "breast self-awareness." This means understanding the normal appearance and feel of your breasts and may include breast self-examination. Any changes detected, no matter how small, should be reported to a health care provider. Women in their 8s and 30s should have a clinical breast exam (CBE) by a health care provider as part of a regular health exam every 1 to 3 years. After age 70, women should have a CBE every year. Starting at age 25, women should consider having a mammogram (breast X-ray test) every year. Women who have a family history of breast cancer should talk to their health care provider about genetic screening. Women at a high risk of breast cancer should talk to their health care providers about having an MRI and a mammogram every year.  Breast cancer gene (BRCA)-related cancer risk assessment is recommended for women who have family members with BRCA-related cancers. BRCA-related cancers include breast, ovarian, tubal, and peritoneal cancers. Having family members with these cancers may be associated with an increased risk for harmful changes (mutations) in the breast cancer genes BRCA1 and BRCA2. Results of the assessment will determine the need for genetic counseling and  BRCA1 and BRCA2 testing.  Routine pelvic exams to screen for cancer are no longer recommended for nonpregnant women who are considered low risk for cancer of the pelvic organs (ovaries, uterus, and vagina) and who do not have symptoms. Ask your health care provider if a screening pelvic exam is right for you.  If you have had past treatment for cervical cancer or a condition that could lead to cancer, you need Pap tests and screening for cancer for at least 20 years after your treatment. If Pap tests have been discontinued, your risk factors (such as having a new sexual partner) need to be reassessed to determine if screening should be resumed. Some women have medical problems that increase the chance of getting cervical cancer. In these cases, your health care provider may recommend more frequent screening and Pap tests.  The HPV test is an additional test that may be used for cervical cancer screening. The HPV test looks for the virus that can cause the cell changes on the cervix. The cells collected during the Pap test can be  tested for HPV. The HPV test could be used to screen women aged 30 years and older, and should be used in women of any age who have unclear Pap test results. After the age of 30, women should have HPV testing at the same frequency as a Pap test.  Colorectal cancer can be detected and often prevented. Most routine colorectal cancer screening begins at the age of 50 years and continues through age 75 years. However, your health care provider may recommend screening at an earlier age if you have risk factors for colon cancer. On a yearly basis, your health care provider may provide home test kits to check for hidden blood in the stool. Use of a small camera at the end of a tube, to directly examine the colon (sigmoidoscopy or colonoscopy), can detect the earliest forms of colorectal cancer. Talk to your health care provider about this at age 50, when routine screening begins. Direct  exam of the colon should be repeated every 5-10 years through age 75 years, unless early forms of pre-cancerous polyps or small growths are found.  People who are at an increased risk for hepatitis B should be screened for this virus. You are considered at high risk for hepatitis B if:  You were born in a country where hepatitis B occurs often. Talk with your health care provider about which countries are considered high risk.  Your parents were born in a high-risk country and you have not received a shot to protect against hepatitis B (hepatitis B vaccine).  You have HIV or AIDS.  You use needles to inject street drugs.  You live with, or have sex with, someone who has hepatitis B.  You get hemodialysis treatment.  You take certain medicines for conditions like cancer, organ transplantation, and autoimmune conditions.  Hepatitis C blood testing is recommended for all people born from 1945 through 1965 and any individual with known risks for hepatitis C.  Practice safe sex. Use condoms and avoid high-risk sexual practices to reduce the spread of sexually transmitted infections (STIs). STIs include gonorrhea, chlamydia, syphilis, trichomonas, herpes, HPV, and human immunodeficiency virus (HIV). Herpes, HIV, and HPV are viral illnesses that have no cure. They can result in disability, cancer, and death.  You should be screened for sexually transmitted illnesses (STIs) including gonorrhea and chlamydia if:  You are sexually active and are younger than 24 years.  You are older than 24 years and your health care provider tells you that you are at risk for this type of infection.  Your sexual activity has changed since you were last screened and you are at an increased risk for chlamydia or gonorrhea. Ask your health care provider if you are at risk.  If you are at risk of being infected with HIV, it is recommended that you take a prescription medicine daily to prevent HIV infection. This is  called preexposure prophylaxis (PrEP). You are considered at risk if:  You are a heterosexual woman, are sexually active, and are at increased risk for HIV infection.  You take drugs by injection.  You are sexually active with a partner who has HIV.  Talk with your health care provider about whether you are at high risk of being infected with HIV. If you choose to begin PrEP, you should first be tested for HIV. You should then be tested every 3 months for as long as you are taking PrEP.  Osteoporosis is a disease in which the bones lose minerals and strength   with aging. This can result in serious bone fractures or breaks. The risk of osteoporosis can be identified using a bone density scan. Women ages 65 years and over and women at risk for fractures or osteoporosis should discuss screening with their health care providers. Ask your health care provider whether you should take a calcium supplement or vitamin D to reduce the rate of osteoporosis.  Menopause can be associated with physical symptoms and risks. Hormone replacement therapy is available to decrease symptoms and risks. You should talk to your health care provider about whether hormone replacement therapy is right for you.  Use sunscreen. Apply sunscreen liberally and repeatedly throughout the day. You should seek shade when your shadow is shorter than you. Protect yourself by wearing long sleeves, pants, a wide-brimmed hat, and sunglasses year round, whenever you are outdoors.  Once a month, do a whole body skin exam, using a mirror to look at the skin on your back. Tell your health care provider of new moles, moles that have irregular borders, moles that are larger than a pencil eraser, or moles that have changed in shape or color.  Stay current with required vaccines (immunizations).  Influenza vaccine. All adults should be immunized every year.  Tetanus, diphtheria, and acellular pertussis (Td, Tdap) vaccine. Pregnant women should  receive 1 dose of Tdap vaccine during each pregnancy. The dose should be obtained regardless of the length of time since the last dose. Immunization is preferred during the 27th-36th week of gestation. An adult who has not previously received Tdap or who does not know her vaccine status should receive 1 dose of Tdap. This initial dose should be followed by tetanus and diphtheria toxoids (Td) booster doses every 10 years. Adults with an unknown or incomplete history of completing a 3-dose immunization series with Td-containing vaccines should begin or complete a primary immunization series including a Tdap dose. Adults should receive a Td booster every 10 years.  Varicella vaccine. An adult without evidence of immunity to varicella should receive 2 doses or a second dose if she has previously received 1 dose. Pregnant females who do not have evidence of immunity should receive the first dose after pregnancy. This first dose should be obtained before leaving the health care facility. The second dose should be obtained 4-8 weeks after the first dose.  Human papillomavirus (HPV) vaccine. Females aged 13-26 years who have not received the vaccine previously should obtain the 3-dose series. The vaccine is not recommended for use in pregnant females. However, pregnancy testing is not needed before receiving a dose. If a female is found to be pregnant after receiving a dose, no treatment is needed. In that case, the remaining doses should be delayed until after the pregnancy. Immunization is recommended for any person with an immunocompromised condition through the age of 26 years if she did not get any or all doses earlier. During the 3-dose series, the second dose should be obtained 4-8 weeks after the first dose. The third dose should be obtained 24 weeks after the first dose and 16 weeks after the second dose.  Zoster vaccine. One dose is recommended for adults aged 60 years or older unless certain conditions are  present.  Measles, mumps, and rubella (MMR) vaccine. Adults born before 1957 generally are considered immune to measles and mumps. Adults born in 1957 or later should have 1 or more doses of MMR vaccine unless there is a contraindication to the vaccine or there is laboratory evidence of immunity to   each of the three diseases. A routine second dose of MMR vaccine should be obtained at least 28 days after the first dose for students attending postsecondary schools, health care workers, or international travelers. People who received inactivated measles vaccine or an unknown type of measles vaccine during 1963-1967 should receive 2 doses of MMR vaccine. People who received inactivated mumps vaccine or an unknown type of mumps vaccine before 1979 and are at high risk for mumps infection should consider immunization with 2 doses of MMR vaccine. For females of childbearing age, rubella immunity should be determined. If there is no evidence of immunity, females who are not pregnant should be vaccinated. If there is no evidence of immunity, females who are pregnant should delay immunization until after pregnancy. Unvaccinated health care workers born before 1957 who lack laboratory evidence of measles, mumps, or rubella immunity or laboratory confirmation of disease should consider measles and mumps immunization with 2 doses of MMR vaccine or rubella immunization with 1 dose of MMR vaccine.  Pneumococcal 13-valent conjugate (PCV13) vaccine. When indicated, a person who is uncertain of her immunization history and has no record of immunization should receive the PCV13 vaccine. An adult aged 19 years or older who has certain medical conditions and has not been previously immunized should receive 1 dose of PCV13 vaccine. This PCV13 should be followed with a dose of pneumococcal polysaccharide (PPSV23) vaccine. The PPSV23 vaccine dose should be obtained at least 8 weeks after the dose of PCV13 vaccine. An adult aged 19  years or older who has certain medical conditions and previously received 1 or more doses of PPSV23 vaccine should receive 1 dose of PCV13. The PCV13 vaccine dose should be obtained 1 or more years after the last PPSV23 vaccine dose.  Pneumococcal polysaccharide (PPSV23) vaccine. When PCV13 is also indicated, PCV13 should be obtained first. All adults aged 65 years and older should be immunized. An adult younger than age 65 years who has certain medical conditions should be immunized. Any person who resides in a nursing home or long-term care facility should be immunized. An adult smoker should be immunized. People with an immunocompromised condition and certain other conditions should receive both PCV13 and PPSV23 vaccines. People with human immunodeficiency virus (HIV) infection should be immunized as soon as possible after diagnosis. Immunization during chemotherapy or radiation therapy should be avoided. Routine use of PPSV23 vaccine is not recommended for American Indians, [place] Natives, or people younger than 65 years unless there are medical conditions that require PPSV23 vaccine. When indicated, people who have unknown immunization and have no record of immunization should receive PPSV23 vaccine. One-time revaccination 5 years after the first dose of PPSV23 is recommended for people aged 19-64 years who have chronic kidney failure, nephrotic syndrome, asplenia, or immunocompromised conditions. People who received 1-2 doses of PPSV23 before age 65 years should receive another dose of PPSV23 vaccine at age 65 years or later if at least 5 years have passed since the previous dose. Doses of PPSV23 are not needed for people immunized with PPSV23 at or after age 65 years.  Meningococcal vaccine. Adults with asplenia or persistent complement component deficiencies should receive 2 doses of quadrivalent meningococcal conjugate (MenACWY-D) vaccine. The doses should be obtained at least 2 months apart.  Microbiologists working with certain meningococcal bacteria, military recruits, people at risk during an outbreak, and people who travel to or live in countries with a high rate of meningitis should be immunized. A first-year college student up through age   21 years who is living in a residence hall should receive a dose if she did not receive a dose on or after her 16th birthday. Adults who have certain high-risk conditions should receive one or more doses of vaccine.  Hepatitis A vaccine. Adults who wish to be protected from this disease, have certain high-risk conditions, work with hepatitis A-infected animals, work in hepatitis A research labs, or travel to or work in countries with a high rate of hepatitis A should be immunized. Adults who were previously unvaccinated and who anticipate close contact with an international adoptee during the first 60 days after arrival in the [place] from a country with a high rate of hepatitis A should be immunized.  Hepatitis B vaccine. Adults who wish to be protected from this disease, have certain high-risk conditions, may be exposed to blood or other infectious body fluids, are household contacts or sex partners of hepatitis B positive people, are clients or workers in certain care facilities, or travel to or work in countries with a high rate of hepatitis B should be immunized.  Haemophilus influenzae type b (Hib) vaccine. A previously unvaccinated person with asplenia or sickle cell disease or having a scheduled splenectomy should receive 1 dose of Hib vaccine. Regardless of previous immunization, a recipient of a hematopoietic stem cell transplant should receive a 3-dose series 6-12 months after her successful transplant. Hib vaccine is not recommended for adults with HIV infection. Preventive Services / Frequency Ages 64 to 68 years  Blood pressure check.** / Every 1 to 2 years.  Lipid and cholesterol check.** / Every 5 years beginning at age  22.  Clinical breast exam.** / Every 3 years for women in their 88s and 53s.  BRCA-related cancer risk assessment.** / For women who have family members with a BRCA-related cancer (breast, ovarian, tubal, or peritoneal cancers).  Pap test.** / Every 2 years from ages 90 through 51. Every 3 years starting at age 21 through age 56 or 3 with a history of 3 consecutive normal Pap tests.  HPV screening.** / Every 3 years from ages 24 through ages 1 to 46 with a history of 3 consecutive normal Pap tests.  Hepatitis C blood test.** / For any individual with known risks for hepatitis C.  Skin self-exam. / Monthly.  Influenza vaccine. / Every year.  Tetanus, diphtheria, and acellular pertussis (Tdap, Td) vaccine.** / Consult your health care provider. Pregnant women should receive 1 dose of Tdap vaccine during each pregnancy. 1 dose of Td every 10 years.  Varicella vaccine.** / Consult your health care provider. Pregnant females who do not have evidence of immunity should receive the first dose after pregnancy.  HPV vaccine. / 3 doses over 6 months, if 72 and younger. The vaccine is not recommended for use in pregnant females. However, pregnancy testing is not needed before receiving a dose.  Measles, mumps, rubella (MMR) vaccine.** / You need at least 1 dose of MMR if you were born in 1957 or later. You may also need a 2nd dose. For females of childbearing age, rubella immunity should be determined. If there is no evidence of immunity, females who are not pregnant should be vaccinated. If there is no evidence of immunity, females who are pregnant should delay immunization until after pregnancy.  Pneumococcal 13-valent conjugate (PCV13) vaccine.** / Consult your health care provider.  Pneumococcal polysaccharide (PPSV23) vaccine.** / 1 to 2 doses if you smoke cigarettes or if you have certain conditions.  Meningococcal vaccine.** /  1 dose if you are age 19 to 21 years and a first-year college  student living in a residence hall, or have one of several medical conditions, you need to get vaccinated against meningococcal disease. You may also need additional booster doses.  Hepatitis A vaccine.** / Consult your health care provider.  Hepatitis B vaccine.** / Consult your health care provider.  Haemophilus influenzae type b (Hib) vaccine.** / Consult your health care provider. Ages 40 to 64 years  Blood pressure check.** / Every 1 to 2 years.  Lipid and cholesterol check.** / Every 5 years beginning at age 20 years.  Lung cancer screening. / Every year if you are aged 55-80 years and have a 30-pack-year history of smoking and currently smoke or have quit within the past 15 years. Yearly screening is stopped once you have quit smoking for at least 15 years or develop a health problem that would prevent you from having lung cancer treatment.  Clinical breast exam.** / Every year after age 40 years.  BRCA-related cancer risk assessment.** / For women who have family members with a BRCA-related cancer (breast, ovarian, tubal, or peritoneal cancers).  Mammogram.** / Every year beginning at age 40 years and continuing for as long as you are in good health. Consult with your health care provider.  Pap test.** / Every 3 years starting at age 30 years through age 65 or 70 years with a history of 3 consecutive normal Pap tests.  HPV screening.** / Every 3 years from ages 30 years through ages 65 to 70 years with a history of 3 consecutive normal Pap tests.  Fecal occult blood test (FOBT) of stool. / Every year beginning at age 50 years and continuing until age 75 years. You may not need to do this test if you get a colonoscopy every 10 years.  Flexible sigmoidoscopy or colonoscopy.** / Every 5 years for a flexible sigmoidoscopy or every 10 years for a colonoscopy beginning at age 50 years and continuing until age 75 years.  Hepatitis C blood test.** / For all people born from 1945 through  1965 and any individual with known risks for hepatitis C.  Skin self-exam. / Monthly.  Influenza vaccine. / Every year.  Tetanus, diphtheria, and acellular pertussis (Tdap/Td) vaccine.** / Consult your health care provider. Pregnant women should receive 1 dose of Tdap vaccine during each pregnancy. 1 dose of Td every 10 years.  Varicella vaccine.** / Consult your health care provider. Pregnant females who do not have evidence of immunity should receive the first dose after pregnancy.  Zoster vaccine.** / 1 dose for adults aged 60 years or older.  Measles, mumps, rubella (MMR) vaccine.** / You need at least 1 dose of MMR if you were born in 1957 or later. You may also need a 2nd dose. For females of childbearing age, rubella immunity should be determined. If there is no evidence of immunity, females who are not pregnant should be vaccinated. If there is no evidence of immunity, females who are pregnant should delay immunization until after pregnancy.  Pneumococcal 13-valent conjugate (PCV13) vaccine.** / Consult your health care provider.  Pneumococcal polysaccharide (PPSV23) vaccine.** / 1 to 2 doses if you smoke cigarettes or if you have certain conditions.  Meningococcal vaccine.** / Consult your health care provider.  Hepatitis A vaccine.** / Consult your health care provider.  Hepatitis B vaccine.** / Consult your health care provider.  Haemophilus influenzae type b (Hib) vaccine.** / Consult your health care provider. Ages 65   years and over  Blood pressure check.** / Every 1 to 2 years.  Lipid and cholesterol check.** / Every 5 years beginning at age 22 years.  Lung cancer screening. / Every year if you are aged 73-80 years and have a 30-pack-year history of smoking and currently smoke or have quit within the past 15 years. Yearly screening is stopped once you have quit smoking for at least 15 years or develop a health problem that would prevent you from having lung cancer  treatment.  Clinical breast exam.** / Every year after age 4 years.  BRCA-related cancer risk assessment.** / For women who have family members with a BRCA-related cancer (breast, ovarian, tubal, or peritoneal cancers).  Mammogram.** / Every year beginning at age 40 years and continuing for as long as you are in good health. Consult with your health care provider.  Pap test.** / Every 3 years starting at age 9 years through age 34 or 91 years with 3 consecutive normal Pap tests. Testing can be stopped between 65 and 70 years with 3 consecutive normal Pap tests and no abnormal Pap or HPV tests in the past 10 years.  HPV screening.** / Every 3 years from ages 57 years through ages 64 or 45 years with a history of 3 consecutive normal Pap tests. Testing can be stopped between 65 and 70 years with 3 consecutive normal Pap tests and no abnormal Pap or HPV tests in the past 10 years.  Fecal occult blood test (FOBT) of stool. / Every year beginning at age 15 years and continuing until age 17 years. You may not need to do this test if you get a colonoscopy every 10 years.  Flexible sigmoidoscopy or colonoscopy.** / Every 5 years for a flexible sigmoidoscopy or every 10 years for a colonoscopy beginning at age 86 years and continuing until age 71 years.  Hepatitis C blood test.** / For all people born from [birth year] through 1967 and any individual with known risks for hepatitis C.  Osteoporosis screening.** / A one-time screening for women ages 83 years and over and women at risk for fractures or osteoporosis.  Skin self-exam. / Monthly.  Influenza vaccine. / Every year.  Tetanus, diphtheria, and acellular pertussis (Tdap/Td) vaccine.** / 1 dose of Td every 10 years.  Varicella vaccine.** / Consult your health care provider.  Zoster vaccine.** / 1 dose for adults aged 61 years or older.  Pneumococcal 13-valent conjugate (PCV13) vaccine.** / Consult your health care provider.  Pneumococcal  polysaccharide (PPSV23) vaccine.** / 1 dose for all adults aged 28 years and older.  Meningococcal vaccine.** / Consult your health care provider.  Hepatitis A vaccine.** / Consult your health care provider.  Hepatitis B vaccine.** / Consult your health care provider.  Haemophilus influenzae type b (Hib) vaccine.** / Consult your health care provider. ** Family history and personal history of risk and conditions may change your health care provider's recommendations. Document Released: 02/25/2001 Document Revised: 05/16/2013 Document Reviewed: 05/27/2010 [facility] Patient Information 2015 [place], [place]. This information is not intended to replace advice given to you by your health care provider. Make sure you discuss any questions you have with your health care provider.

## 2014-03-03 NOTE — Progress Notes (Signed)
Pre visit review using our clinic review tool, if applicable. No additional management support is needed unless otherwise documented below in the visit note. 

## 2014-03-06 LAB — CYTOLOGY - PAP

## 2014-03-09 ENCOUNTER — Ambulatory Visit: Payer: Commercial Managed Care - PPO | Admitting: Internal Medicine

## 2014-03-20 ENCOUNTER — Encounter: Payer: Self-pay | Admitting: Internal Medicine

## 2014-03-20 ENCOUNTER — Other Ambulatory Visit: Payer: Self-pay | Admitting: *Deleted

## 2014-03-20 ENCOUNTER — Ambulatory Visit (INDEPENDENT_AMBULATORY_CARE_PROVIDER_SITE_OTHER): Payer: Commercial Managed Care - PPO | Admitting: Internal Medicine

## 2014-03-20 VITALS — BP 108/76 | HR 115 | Temp 98.3°F | Resp 12 | Ht 66.0 in | Wt 208.8 lb

## 2014-03-20 DIAGNOSIS — E1165 Type 2 diabetes mellitus with hyperglycemia: Secondary | ICD-10-CM

## 2014-03-20 MED ORDER — ACCU-CHEK FASTCLIX LANCETS MISC: Status: DC

## 2014-03-20 MED ORDER — GLUCOSE BLOOD VI STRP: ORAL_STRIP | Status: DC

## 2014-03-20 MED ORDER — CANAGLIFLOZIN 100 MG PO TABS: 100.0000 mg | ORAL_TABLET | Freq: Every day | ORAL | Status: DC

## 2014-03-20 NOTE — Patient Instructions (Addendum)
Please continue Metformin XR 750 mg 2x a day. Continue Victoza 1.8 mg daily Start Invokana 100 mg daily in am.  Please return in 1 month with your sugar log.   Please schedule an appt with Cheryl Hart.  PATIENT INSTRUCTIONS FOR TYPE 2 DIABETES:  **Please join MyChart!** - see attached instructions about how to join if you have not done so already.  DIET AND EXERCISE Diet and exercise is an important part of diabetic treatment.  We recommended aerobic exercise in the form of brisk walking (working between 40-60% of maximal aerobic capacity, similar to brisk walking) for 150 minutes per week (such as 30 minutes five days per week) along with 3 times per week performing 'resistance' training (using various gauge rubber tubes with handles) 5-10 exercises involving the major muscle groups (upper body, lower body and core) performing 10-15 repetitions (or near fatigue) each exercise. Start at half the above goal but build slowly to reach the above goals. If limited by weight, joint pain, or disability, we recommend daily walking in a swimming pool with water up to waist to reduce pressure from joints while allow for adequate exercise.    BLOOD GLUCOSES Monitoring your blood glucoses is important for continued management of your diabetes. Please check your blood glucoses 2-4 times a day: fasting, before meals and at bedtime (you can rotate these measurements - e.g. one day check before the 3 meals, the next day check before 2 of the meals and before bedtime, etc.).   HYPOGLYCEMIA (low blood sugar) Hypoglycemia is usually a reaction to not eating, exercising, or taking too much insulin/ other diabetes drugs.  Symptoms include tremors, sweating, hunger, confusion, headache, etc. Treat IMMEDIATELY with 15 grams of Carbs: . 4 glucose tablets .  cup regular juice/soda . 2 tablespoons raisins . 4 teaspoons sugar . 1 tablespoon honey Recheck blood glucose in 15 mins and repeat above if still  symptomatic/blood glucose <100.  RECOMMENDATIONS TO REDUCE YOUR RISK OF DIABETIC COMPLICATIONS: * Take your prescribed MEDICATION(S) * Follow a DIABETIC diet: Complex carbs, fiber rich foods, (monounsaturated and polyunsaturated) fats * AVOID saturated/trans fats, high fat foods, >2,300 mg salt per day. * EXERCISE at least 5 times a week for 30 minutes or preferably daily.  * DO NOT SMOKE OR DRINK more than 1 drink a day. * Check your FEET every day. Do not wear tightfitting shoes. Contact us if you develop an ulcer * See your EYE doctor once a year or more if needed * Get a FLU shot once a year * Get a PNEUMONIA vaccine once before and once after age 79 years  GOALS:  * Your Hemoglobin A1c of <7%  * fasting sugars need to be <130 * after meals sugars need to be <180 (2h after you start eating) * Your Systolic BP should be 161 or lower  * Your Diastolic BP should be 80 or lower  * Your HDL (Good Cholesterol) should be 40 or higher  * Your LDL (Bad Cholesterol) should be 100 or lower. * Your Triglycerides should be 150 or lower  * Your Urine microalbumin (kidney function) should be <30 * Your Body Mass Index should be 25 or lower    Please consider the following ways to cut down carbs and fat and increase fiber and micronutrients in your diet: - substitute whole grain for white bread or pasta - substitute brown rice for white rice - substitute 90-calorie flat bread pieces for slices of bread when possible - substitute  sweet potatoes or yams for white potatoes - substitute humus for margarine - substitute tofu for cheese when possible - substitute almond or rice milk for regular milk (would not drink soy milk daily due to concern for soy estrogen influence on breast cancer risk) - substitute dark chocolate for other sweets when possible - substitute water - can add lemon or orange slices for taste - for diet sodas (artificial sweeteners will trick your body that you can eat sweets  without getting calories and will lead you to overeating and weight gain in the long run) - do not skip breakfast or other meals (this will slow down the metabolism and will result in more weight gain over time)  - can try smoothies made from fruit and almond/rice milk in am instead of regular breakfast - can also try old-fashioned (not instant) oatmeal made with almond/rice milk in am - order the dressing on the side when eating salad at a restaurant (pour less than half of the dressing on the salad) - eat as little meat as possible - can try juicing, but should not forget that juicing will get rid of the fiber, so would alternate with eating raw veg./fruits or drinking smoothies - use as little oil as possible, even when using olive oil - can dress a salad with a mix of balsamic vinegar and lemon juice, for e.g. - use agave nectar, stevia sugar, or regular sugar rather than artificial sweateners - steam or broil/roast veggies  - snack on veggies/fruit/nuts (unsalted, preferably) when possible, rather than processed foods - reduce or eliminate aspartame in diet (it is in diet sodas, chewing gum, etc) Read the labels!  Try to read Dr. Janene Harvey book: "Program for Reversing Diabetes" for other ideas for healthy eating.

## 2014-03-20 NOTE — Progress Notes (Signed)
Patient ID: Cheryl Hart, female   DOB: 1975-06-30, 39 y.o.   MRN: [id number]  HPI: Cheryl Hart is a 39 y.o.-year-old female, referred by her PCP, Dr. Etter Sjogren, for management of DM2, dx in 2010, non-insulin-dependent, uncontrolled, without complications.  Last hemoglobin A1c was: Lab Results  Component Value Date   HGBA1C 10.3* 03/03/2014   HGBA1C 9.3* 02/21/2013   HGBA1C 11.8* 03/18/2012   Pt is on a regimen of: - Metformin XR 750 mg po bid - Victoza 1.8 mg daily  Pt does not check her sugars  -only at work, during the weekends.  - am: 140-150, sometimes higher - 2h after b'fast: n/c - before lunch: n/c - 2h after lunch: 200s - before dinner: n/c - 2h after dinner: n/c - bedtime: n/c - nighttime: n/c No lows. Lowest sugar was 133; ? if she has hypoglycemia awareness Highest sugar was 420.  Glucometer: none now  Pt's meals are: - Breakfast: biscuit (fast food) - Lunch: can skip; sandwich, chips, fruit - Dinner:  Meat, veggie, starch - Snacks: cottage cheese + fruit Saw nutrition in 2010.  She has increased urination. No blurry vision.  - no CKD, last BUN/creatinine:  Lab Results  Component Value Date   BUN 12 03/03/2014   CREATININE 0.59 03/03/2014   - last set of lipids: Lab Results  Component Value Date   CHOL 203* 03/03/2014   HDL 38.90* 03/03/2014   LDLCALC 138* 03/03/2014   LDLDIRECT 148.9 03/18/2012   TRIG 133.0 03/03/2014   CHOLHDL 5 03/03/2014   - last eye exam was in 08/2012. No DR.  - no numbness and tingling in her feet. She had a foot exam on 03/03/2014 with PCP.  Pt has FH of DM in father, brother, MGM.  She also has a h/o anemia, h/o yeast infections - Diflucan prn.   ROS: Constitutional: no weight gain/loss, + fatigue, + hot flushes, + poor sleep, + excessive urination and nocturia Eyes: no blurry vision, no xerophthalmia ENT: no sore throat, no nodules palpated in throat, no dysphagia/odynophagia, no hoarseness Cardiovascular: no  CP/SOB/palpitations/leg swelling Respiratory: no cough/SOB Gastrointestinal: no N/V/D/C Musculoskeletal: no muscle/joint aches Skin: no rashes Neurological: no tremors/numbness/tingling/dizziness Psychiatric: no depression/anxiety  Past Medical History  Diagnosis Date  . Diabetes mellitus   . Anxiety   . Hyperlipidemia    Past Surgical History  Procedure Laterality Date  . Cesarean section    . G1 p1     History   Social History  . Marital Status: Single    Spouse Name: N/A  . Number of Children: 1   Occupational History  . psych tech [employer]   Social History Main Topics  . Smoking status: Never Smoker   . Smokeless tobacco: Never Used  . Alcohol Use: No  . Drug Use: No  . Sexual Activity:    Partners: Male    Birth Control/ Protection: Pill   Social History Narrative   Exercise-- no   Current Outpatient Prescriptions on File Prior to Visit  Medication Sig Dispense Refill  . DULoxetine (CYMBALTA) 60 MG capsule TAKE ONE CAPSULE BY MOUTH ONCE DAILY 90 capsule 0  . Fe Cbn-Fe Gluc-FA-B12-C-DSS (FERRALET 90) 90-1 MG TABS Take 90 mg by mouth daily. 90 each 3  . fluconazole (DIFLUCAN) 150 MG tablet Take 1 tablet (150 mg total) by mouth once. May repeat in 3 days prn 2 tablet 2  . FreeStyle Unistick II Lancets MISC by Does not apply route. Use as directed     .  glucose blood (FREESTYLE LITE) test strip 1 each by Other route as needed. Use as instructed before breakfast and 2-2 1/2 hours after a meal     . ibuprofen (ADVIL,MOTRIN) 200 MG tablet Take 800 mg by mouth every 4 (four) hours as needed. pain     . Insulin Pen Needle (NOVOFINE) 32G X 6 MM MISC by Does not apply route. Use as directed     . Insulin Pen Needle 32G X 6 MM MISC by Does not apply route.      . metFORMIN (GLUCOPHAGE-XR) 750 MG 24 hr tablet TAKE 2 TABLETS BY MOUTH EVERY EVENING--REPEAT LABS ARE DUE 60 tablet 0  . nystatin (MYCOSTATIN) powder Apply topically 4 (four) times daily. 15 g  0  . VICTOZA 18 MG/3ML SOPN INJECT 1.8 MG SUBCUTANEOUSLY DAILY 9 mL 0   No current facility-administered medications on file prior to visit.   Allergies  Allergen Reactions  . Sulfonamide Derivatives Photosensitivity, Rash and Other (See Comments)    Severe headache   Family History  Problem Relation Age of Onset  . Migraines    . Diabetes Mother   . Diabetes Father   . Diabetes Brother    PE: BP 108/76 mmHg  Pulse 115  Temp(Src) 98.3 F (36.8 C) (Oral)  Resp 12  Ht 5\' 6"  (1.676 m)  Wt 208 lb 12.8 oz (94.711 kg)  BMI 33.72 kg/m2  SpO2 99%  LMP 02/15/2014 Wt Readings from Last 3 Encounters:  03/20/14 208 lb 12.8 oz (94.711 kg)  03/03/14 208 lb 6.4 oz (94.53 kg)  02/21/13 209 lb (94.802 kg)   Constitutional: overweight, in NAD Eyes: PERRLA, EOMI, no exophthalmos ENT: moist mucous membranes, no thyromegaly, no cervical lymphadenopathy Cardiovascular: tachycardia, RR, No MRG Respiratory: CTA B Gastrointestinal: abdomen soft, NT, ND, BS+ Musculoskeletal: no deformities, strength intact in all 4 Skin: moist, warm, no rashes Neurological: no tremor with outstretched hands, DTR normal in all 4  ASSESSMENT: 1. DM2, non-insulin-dependent, uncontrolled, without complications  PLAN:  1. Patient with long-standing, uncontrolled diabetes, on oral antidiabetic regimen + GLP1 R agonist, which became insufficient. Will add Invokana. - We discussed about options for treatment, and I suggested to:  Patient Instructions  Please continue Metformin XR 750 mg 2x a day. Continue Victoza 1.8 mg daily Start Invokana 100 mg daily in am.  Please return in 1 month with your sugar log.   Please schedule an appt with Antonieta Iba. - we discussed about SEs of Invokana, which are: dizziness (advised to be careful when stands from sitting position), decreased BP - usually not < normal (BP today is not low), and fungal UTIs (advised to let me know if develops one).  - given discount card for  Invokana - Strongly advised her to start checking sugars at different times of the day - check 2 times a day, rotating checks - given an AccuChek nano meter + sent supplies for it to her pharmacy - given sugar log and advised how to fill it and to bring it at next appt  - given foot care handout and explained the principles  - given instructions for hypoglycemia management "15-15 rule"  - advised for yearly eye exams >> she needs one - referred her to nutrition - Return to clinic in 1 mo with sugar log

## 2014-03-23 ENCOUNTER — Other Ambulatory Visit: Payer: Self-pay | Admitting: Family Medicine

## 2014-03-23 NOTE — Telephone Encounter (Signed)
Rx's sent to the pharmacy by e-script.//AB/CMA 

## 2014-04-13 ENCOUNTER — Other Ambulatory Visit: Payer: Self-pay | Admitting: Family Medicine

## 2014-04-13 NOTE — Telephone Encounter (Signed)
Please advise if refill is appropriate.     KP 

## 2014-04-21 ENCOUNTER — Ambulatory Visit: Payer: Commercial Managed Care - PPO | Admitting: Family Medicine

## 2014-04-24 ENCOUNTER — Ambulatory Visit: Payer: Commercial Managed Care - PPO | Admitting: Family Medicine

## 2014-04-24 DIAGNOSIS — Z0289 Encounter for other administrative examinations: Secondary | ICD-10-CM

## 2014-04-25 ENCOUNTER — Telehealth: Payer: Self-pay | Admitting: Family Medicine

## 2014-04-25 ENCOUNTER — Ambulatory Visit: Payer: Commercial Managed Care - PPO | Admitting: Family Medicine

## 2014-04-25 DIAGNOSIS — Z0289 Encounter for other administrative examinations: Secondary | ICD-10-CM

## 2014-04-25 NOTE — Telephone Encounter (Signed)
Pt states her car is not working and will call back to [facility]

## 2014-04-25 NOTE — Telephone Encounter (Signed)
ok 

## 2014-05-03 ENCOUNTER — Encounter: Payer: Self-pay | Admitting: Family Medicine

## 2014-05-03 ENCOUNTER — Telehealth: Payer: Self-pay | Admitting: Family Medicine

## 2014-05-03 NOTE — Telephone Encounter (Signed)
Pt was no show for two acute appointments 4/11 and 4/12. Letter sent- charge?

## 2014-05-04 NOTE — Telephone Encounter (Signed)
yes

## 2014-05-05 ENCOUNTER — Ambulatory Visit: Payer: Commercial Managed Care - PPO | Admitting: Internal Medicine

## 2014-05-05 DIAGNOSIS — Z0289 Encounter for other administrative examinations: Secondary | ICD-10-CM

## 2014-06-16 ENCOUNTER — Other Ambulatory Visit: Payer: Self-pay | Admitting: Family Medicine

## 2014-07-10 ENCOUNTER — Other Ambulatory Visit: Payer: Self-pay

## 2014-10-06 ENCOUNTER — Other Ambulatory Visit: Payer: Self-pay | Admitting: Family Medicine

## 2014-11-22 ENCOUNTER — Other Ambulatory Visit: Payer: Self-pay | Admitting: Family Medicine

## 2014-12-22 ENCOUNTER — Other Ambulatory Visit: Payer: Self-pay | Admitting: Family Medicine

## 2014-12-25 ENCOUNTER — Telehealth: Payer: Self-pay | Admitting: *Deleted

## 2014-12-25 ENCOUNTER — Other Ambulatory Visit (INDEPENDENT_AMBULATORY_CARE_PROVIDER_SITE_OTHER): Payer: Commercial Managed Care - PPO

## 2014-12-25 DIAGNOSIS — D649 Anemia, unspecified: Secondary | ICD-10-CM | POA: Diagnosis not present

## 2014-12-25 LAB — FERRITIN: Ferritin: 3.3 ng/mL — ABNORMAL LOW (ref 10.0–291.0)

## 2014-12-25 LAB — CBC WITH DIFFERENTIAL/PLATELET
Basophils Absolute: 0 10*3/uL (ref 0.0–0.1)
Basophils Relative: 0.1 % (ref 0.0–3.0)
EOS ABS: 0.1 10*3/uL (ref 0.0–0.7)
Eosinophils Relative: 1.2 % (ref 0.0–5.0)
HCT: 22.8 % — CL (ref 36.0–46.0)
LYMPHS ABS: 2.9 10*3/uL (ref 0.7–4.0)
Lymphocytes Relative: 35.6 % (ref 12.0–46.0)
MCV: 57.3 fl — ABNORMAL LOW (ref 78.0–100.0)
MONO ABS: 0.5 10*3/uL (ref 0.1–1.0)
Monocytes Relative: 6 % (ref 3.0–12.0)
NEUTROS PCT: 57.1 % (ref 43.0–77.0)
Neutro Abs: 4.6 10*3/uL (ref 1.4–7.7)
Platelets: 774 10*3/uL — ABNORMAL HIGH (ref 150.0–400.0)
RBC: 3.98 Mil/uL (ref 3.87–5.11)
RDW: 21.7 % — ABNORMAL HIGH (ref 11.5–15.5)
WBC: 8 10*3/uL (ref 4.0–10.5)

## 2014-12-25 LAB — IBC PANEL
IRON: 6 ug/dL — AB (ref 42–145)
Saturation Ratios: 0.9 % — ABNORMAL LOW (ref 20.0–50.0)
TRANSFERRIN: 468 mg/dL — AB (ref 212.0–360.0)

## 2014-12-25 NOTE — Telephone Encounter (Signed)
[facility] called to report critical . pts hemoglobin @ 6.5

## 2014-12-25 NOTE — Telephone Encounter (Signed)
Call pt -- needs ov-- if symptomatic or actively bleeding needs to go to er

## 2014-12-25 NOTE — Telephone Encounter (Signed)
Spoke with patient and she stated she has been really tired and has been bleeding for several months, she said her GYN has not checked her iron or hemoglobin but she has an endometrial biopsy done today. She has agreed to go to the ER tonight and I advised I would call the GYN tomorrow and make them aware. She verbalized understanding.      KP

## 2014-12-26 ENCOUNTER — Emergency Department (HOSPITAL_BASED_OUTPATIENT_CLINIC_OR_DEPARTMENT_OTHER)
Admission: EM | Admit: 2014-12-26 | Discharge: 2014-12-27 | Disposition: A | Discharge status: Home / Self Care | Payer: Commercial Managed Care - PPO | Attending: Emergency Medicine | Admitting: Emergency Medicine

## 2014-12-26 ENCOUNTER — Encounter (HOSPITAL_BASED_OUTPATIENT_CLINIC_OR_DEPARTMENT_OTHER): Payer: Self-pay | Admitting: Emergency Medicine

## 2014-12-26 DIAGNOSIS — D649 Anemia, unspecified: Secondary | ICD-10-CM

## 2014-12-26 DIAGNOSIS — D259 Leiomyoma of uterus, unspecified: Secondary | ICD-10-CM | POA: Diagnosis not present

## 2014-12-26 DIAGNOSIS — Z7984 Long term (current) use of oral hypoglycemic drugs: Secondary | ICD-10-CM | POA: Insufficient documentation

## 2014-12-26 DIAGNOSIS — E785 Hyperlipidemia, unspecified: Secondary | ICD-10-CM | POA: Insufficient documentation

## 2014-12-26 DIAGNOSIS — D6489 Other specified anemias: Secondary | ICD-10-CM | POA: Diagnosis present

## 2014-12-26 DIAGNOSIS — E119 Type 2 diabetes mellitus without complications: Secondary | ICD-10-CM | POA: Insufficient documentation

## 2014-12-26 DIAGNOSIS — N938 Other specified abnormal uterine and vaginal bleeding: Secondary | ICD-10-CM | POA: Insufficient documentation

## 2014-12-26 DIAGNOSIS — N939 Abnormal uterine and vaginal bleeding, unspecified: Secondary | ICD-10-CM

## 2014-12-26 HISTORY — DX: Anemia, unspecified: D64.9

## 2014-12-26 HISTORY — DX: Leiomyoma of uterus, unspecified: D25.9

## 2014-12-26 LAB — CBC WITH DIFFERENTIAL/PLATELET
Basophils Absolute: 0.1 10*3/uL (ref 0.0–0.1)
Basophils Relative: 1 %
Eosinophils Absolute: 0.2 10*3/uL (ref 0.0–0.7)
Eosinophils Relative: 2 %
HEMATOCRIT: 23 % — AB (ref 36.0–46.0)
Hemoglobin: 6.2 g/dL — CL (ref 12.0–15.0)
LYMPHS ABS: 3.1 10*3/uL (ref 0.7–4.0)
Lymphocytes Relative: 40 %
MCH: 15.4 pg — ABNORMAL LOW (ref 26.0–34.0)
MCHC: 27 g/dL — AB (ref 30.0–36.0)
MCV: 57.1 fL — ABNORMAL LOW (ref 78.0–100.0)
Monocytes Absolute: 0.5 10*3/uL (ref 0.1–1.0)
Monocytes Relative: 6 %
NEUTROS ABS: 3.8 10*3/uL (ref 1.7–7.7)
Neutrophils Relative %: 51 %
Platelets: 679 10*3/uL — ABNORMAL HIGH (ref 150–400)
RBC: 4.03 MIL/uL (ref 3.87–5.11)
RDW: 21.5 % — AB (ref 11.5–15.5)
WBC: 7.7 10*3/uL (ref 4.0–10.5)

## 2014-12-26 LAB — ABO/RH: ABO/RH(D): B POS

## 2014-12-26 LAB — PREPARE RBC (CROSSMATCH)

## 2014-12-26 MED ORDER — ACETAMINOPHEN 325 MG PO TABS
650.0000 mg | ORAL_TABLET | Freq: Once | ORAL | Status: AC
  Administered 2014-12-26: 650 mg via ORAL
  Filled 2014-12-26: qty 2

## 2014-12-26 MED ORDER — SODIUM CHLORIDE 0.9 % IV SOLN
10.0000 mL/h | Freq: Once | INTRAVENOUS | Status: AC
  Administered 2014-12-26: 10 mL/h via INTRAVENOUS

## 2014-12-26 NOTE — ED Notes (Signed)
Bed: [id number] Expected date:  Expected time:  Means of arrival:  Comments: Coming from [facility] for transfusion

## 2014-12-26 NOTE — ED Notes (Signed)
Nurse drawing labs. 

## 2014-12-26 NOTE — ED Notes (Signed)
Pt requesting Tylenol for 4/10 HA pain.

## 2014-12-26 NOTE — Telephone Encounter (Signed)
noted 

## 2014-12-26 NOTE — ED Provider Notes (Signed)
CSN: [id number]     Arrival date & time 12/26/14  1509 History   First MD Initiated Contact with Patient 12/26/14 1128     Chief Complaint  Patient presents with  . low hemoglobin       HPI Patient presents from [facility] where she was found to have a low hemoglobin.  Patient has problem with uterine fibroids and is currently followed by her gynecologist in [facility] in their scheduling a procedure in the next week or 2.  She currently is asymptomatic except for a little bit of shortness of breath when she exerts herself otherwise denies syncope or other problems.  She is currently actively bleeding but this is normal for her. Past Medical History  Diagnosis Date  . Diabetes mellitus   . Anxiety   . Hyperlipidemia   . Anemia   . Uterine fibroid    Past Surgical History  Procedure Laterality Date  . Cesarean section    . G1 p1     Family History  Problem Relation Age of Onset  . Migraines    . Diabetes Mother   . Diabetes Father   . Diabetes Brother    Social History  Substance Use Topics  . Smoking status: Never Smoker   . Smokeless tobacco: Never Used  . Alcohol Use: No   OB History    No data available     Review of Systems  All other systems reviewed and are negative  Allergies  Sulfonamide derivatives  Home Medications   Prior to Admission medications   Medication Sig Start Date End Date Taking? Authorizing Provider  ACCU-CHEK FASTCLIX LANCETS MISC Use to test blood sugar 2 times daily as instruced. 03/20/14  Yes Philemon Kingdom, MD  Biotin 5000 MCG TABS Take 1 tablet by mouth daily.   Yes Historical Provider, MD  diphenhydrAMINE (BENADRYL) 25 MG tablet Take 50 mg by mouth at bedtime.   Yes Historical Provider, MD  DULoxetine (CYMBALTA) 60 MG capsule TAKE ONE CAPSULE BY MOUTH ONCE DAILY 10/06/14  Yes Yvonne R Lowne, DO  Fe Cbn-Fe Gluc-FA-B12-C-DSS (FERRALET 90) 90-1 MG TABS Take 90 mg by mouth daily. 02/21/13  Yes Rosalita Chessman, DO  glucose blood  (ACCU-CHEK SMARTVIEW) test strip Use to test blood sugar 2 times daily as instructed 03/20/14  Yes Philemon Kingdom, MD  metFORMIN (GLUCOPHAGE-XR) 750 MG 24 hr tablet Take 2 tablets (1,500 mg total) BY MOUTH EVERY EVENING. 12/22/14  Yes Rosalita Chessman, DO  Multiple Vitamin (MULTIVITAMIN WITH MINERALS) TABS tablet Take 1 tablet by mouth daily.   Yes Historical Provider, MD  OGESTREL 0.5-50 MG-MCG tablet Take 1 tablet by mouth daily. 12/22/14  Yes Historical Provider, MD  VICTOZA 18 MG/3ML SOPN INJECT 1.8 MG SUBCUTANEOUSLY DAILY 06/16/14  Yes Rosalita Chessman, DO  canagliflozin (INVOKANA) 100 MG TABS tablet Take 1 tablet (100 mg total) by mouth daily. Patient not taking: Reported on 12/26/2014 03/20/14   Philemon Kingdom, MD   BP 144/88 mmHg  Pulse 101  Temp(Src) 98.2 F (36.8 C) (Oral)  Resp 20  Ht 5\' 7"  (1.702 m)  Wt 200 lb (90.719 kg)  BMI 31.32 kg/m2  SpO2 98%  LMP 11/18/2014 Physical Exam Physical Exam  Nursing note and vitals reviewed. Constitutional: She is oriented to person, place, and time. She appears well-developed and well-nourished. No distress.  HENT:  Head: Normocephalic and atraumatic.  Eyes: Pupils are equal, round, and reactive to light.  Neck: Normal range of motion.  Cardiovascular: Normal rate and intact distal pulses.   Pulmonary/Chest: No respiratory distress.  Abdominal: Normal appearance. She exhibits no distension.  Musculoskeletal: Normal range of motion.  Neurological: She is alert and oriented to person, place, and time. No cranial nerve deficit.  Skin: Skin is warm and dry. No rash noted.  Psychiatric: She has a normal mood and affect. Her behavior is normal.   ED Course  Procedures (including critical care time) Labs Review Labs Reviewed  CBC WITH DIFFERENTIAL/PLATELET - Abnormal; Notable for the following:    Hemoglobin 6.2 (*)    HCT 23.0 (*)    MCV 57.1 (*)    MCH 15.4 (*)    MCHC 27.0 (*)    RDW 21.5 (*)    Platelets 679 (*)    All other  components within normal limits  I-STAT CHEM 8, ED - Abnormal; Notable for the following:    Glucose, Bld 214 (*)    Hemoglobin 9.9 (*)    HCT 29.0 (*)    All other components within normal limits  TYPE AND SCREEN  PREPARE RBC (CROSSMATCH)  ABO/RH    Imaging Review No results found. I have personally reviewed and evaluated these images and lab results as part of my medical decision-making.   EKG Interpretation None     Patient given two units of packed red blood cells. . Blood count rechecked and hemoglobin had risen to satisfactory level. Patient has follow up with her OB/GYN. Is stable for discharge. MDM   Final diagnoses:  Symptomatic anemia  Uterine leiomyoma, unspecified location  Vagina bleeding        Leonard Schwartz, MD 12/27/14 1645

## 2014-12-26 NOTE — ED Notes (Signed)
Pt was seen at Dr. Nonda Lou office yesterday.  Spoke to nurse at the office yesterday and was told to come to our ED for blood transfusion.

## 2014-12-26 NOTE — ED Notes (Signed)
Pt sent here from [place] for transfusion. Pt without complaints. Pt sent by PCP for HGB of 6.5. Blood drawn at [facility]

## 2014-12-26 NOTE — Telephone Encounter (Signed)
Patient went to the ED today, she went to [facility], however they are going to send her to [facility] because they only have O- and they can not do typing there. I have also called Dr.Okeefe and made they office aware. A VM was left for the Triage nurse.      KP

## 2014-12-26 NOTE — ED Provider Notes (Signed)
CSN: [id number]     Arrival date & time 12/26/14  [id number] History   First MD Initiated Contact with Patient 12/26/14 1128     Chief Complaint  Patient presents with  . low hemoglobin      (Consider location/radiation/quality/duration/timing/severity/associated sxs/prior Treatment) HPI Comments: Patient is a 39 year old female with history of uterine fibroids. She has been having bleeding related to these for the past 2 months. She reports some fatigue and becoming tired more easily with exertion. She had blood checked yesterday by her primary Dr. and was found to have a hemoglobin of 6.5. She was told by the nurse at the office to come to the emergency department for possible transfusion. She denies any fevers or chills. She denies any chest pain. She does report she feels short of breath and dizzy with exertion.  The history is provided by the patient.    Past Medical History  Diagnosis Date  . Diabetes mellitus   . Anxiety   . Hyperlipidemia   . Anemia   . Uterine fibroid    Past Surgical History  Procedure Laterality Date  . Cesarean section    . G1 p1     Family History  Problem Relation Age of Onset  . Migraines    . Diabetes Mother   . Diabetes Father   . Diabetes Brother    Social History  Substance Use Topics  . Smoking status: Never Smoker   . Smokeless tobacco: Never Used  . Alcohol Use: No   OB History    No data available     Review of Systems  All other systems reviewed and are negative.     Allergies  Sulfonamide derivatives  Home Medications   Prior to Admission medications   Medication Sig Start Date End Date Taking? Authorizing Provider  ACCU-CHEK FASTCLIX LANCETS MISC Use to test blood sugar 2 times daily as instruced. 03/20/14   Philemon Kingdom, MD  canagliflozin (INVOKANA) 100 MG TABS tablet Take 1 tablet (100 mg total) by mouth daily. 03/20/14   Philemon Kingdom, MD  DULoxetine (CYMBALTA) 60 MG capsule TAKE ONE CAPSULE BY MOUTH ONCE DAILY  10/06/14   Alferd Apa Lowne, DO  Fe Cbn-Fe Gluc-FA-B12-C-DSS (FERRALET 90) 90-1 MG TABS Take 90 mg by mouth daily. 02/21/13   Rosalita Chessman, DO  fluconazole (DIFLUCAN) 150 MG tablet Take 1 tablet (150 mg total) by mouth once. May repeat in 3 days prn 03/03/14   Rosalita Chessman, DO  FreeStyle Unistick II Lancets MISC by Does not apply route. Use as directed     Historical Provider, MD  glucose blood (ACCU-CHEK SMARTVIEW) test strip Use to test blood sugar 2 times daily as instructed 03/20/14   Philemon Kingdom, MD  glucose blood (FREESTYLE LITE) test strip 1 each by Other route as needed. Use as instructed before breakfast and 2-2 1/2 hours after a meal     Historical Provider, MD  ibuprofen (ADVIL,MOTRIN) 200 MG tablet Take 800 mg by mouth every 4 (four) hours as needed. pain     Historical Provider, MD  Insulin Pen Needle (NOVOFINE) 32G X 6 MM MISC by Does not apply route. Use as directed     Historical Provider, MD  Insulin Pen Needle 32G X 6 MM MISC by Does not apply route.      Historical Provider, MD  metFORMIN (GLUCOPHAGE-XR) 750 MG 24 hr tablet Take 2 tablets (1,500 mg total) BY MOUTH EVERY EVENING. 12/22/14   Rosalita Chessman, DO  nystatin (MYCOSTATIN) powder Apply topically 4 (four) times daily. 03/03/14   Alferd Apa Lowne, DO  VICTOZA 18 MG/3ML SOPN INJECT 1.8 MG SUBCUTANEOUSLY DAILY 06/16/14   Alferd Apa Lowne, DO   BP 136/91 mmHg  Pulse 112  Temp(Src) 98.1 F (36.7 C)  Resp 16  Ht 5\' 7"  (1.702 m)  Wt 200 lb (90.719 kg)  BMI 31.32 kg/m2  SpO2 100%  LMP 11/18/2014 Physical Exam  Constitutional: She is oriented to person, place, and time. She appears well-developed and well-nourished. No distress.  HENT:  Head: Normocephalic and atraumatic.  Neck: Normal range of motion. Neck supple.  Cardiovascular: Normal rate and regular rhythm.  Exam reveals no gallop and no friction rub.   No murmur heard. Pulmonary/Chest: Effort normal and breath sounds normal. No respiratory distress. She has no wheezes.   Abdominal: Soft. Bowel sounds are normal. She exhibits no distension. There is no tenderness.  Musculoskeletal: Normal range of motion.  Neurological: She is alert and oriented to person, place, and time.  Skin: Skin is warm and dry. She is not diaphoretic.  Nursing note and vitals reviewed.   ED Course  Procedures (including critical care time) Labs Review Labs Reviewed - No data to display  Imaging Review No results found. I have personally reviewed and evaluated these images and lab results as part of my medical decision-making.   EKG Interpretation None      MDM   Final diagnoses:  None    Patient presents here with persistent mild vaginal bleeding from a uterine fibroid. Her OB doctor is Dr. Sharlyn Bologna at [facility] in [place]. She had blood work performed by her primary doctor yesterday which revealed a hemoglobin of 6.5. She was told to come to the ER for transfusion. She presents here today for this, however we do not have a blood bank or any way of cross-matching her for this. I have spoken with the physician on-call at [facility] and they do not feel as though there is anything emergent for them to do. They are recommending her be transfused under the care of her primary physician.   The patient's primary doctor is Dr. Etter Sjogren at [place]. I spoke with the hospitalist, Dr. Tyrell Antonio, who declines admission secondary to my not initiating a transfusion here. I explained to her that I have no blood to transfuse. It is her recommendation the patient go to the ER. I've spoken with Dr. Tyrone Nine at [facility] who agrees to accept the patient in transfer to the emergency department. She will undergo transfusion of 2 units PRBCs there. I suspect when this transfusion is complete, she will be appropriate for discharge. She can follow-up with her GYN in [place] at that time to discuss the proper management of her fibroid.    Veryl Speak, MD 12/26/14 [phone]

## 2014-12-26 NOTE — Discharge Instructions (Signed)
Blood Transfusion, Care After Refer to this sheet in the next few weeks. These instructions provide you with information about caring for yourself after your procedure. Your health care provider may also give you more specific instructions. Your treatment has been planned according to current medical practices, but problems sometimes occur. Call your health care provider if you have any problems or questions after your procedure. WHAT TO EXPECT AFTER THE PROCEDURE After your procedure, it is common to have:  Bruising and soreness at the IV site.  Chills or fever.  Headache. HOME CARE INSTRUCTIONS  Take medicines only as directed by your health care provider. Ask your health care provider if you can take an over-the-counter pain reliever in case you have a fever or headache a day or two after your transfusion.  Return to your normal activities as directed by your health care provider. SEEK MEDICAL CARE IF:   You develop redness or irritation at your IV site.  You have persistent fever, chills, or headache.  Your urine is darker than normal.  Your urine turns pink, red, or brown.   The white part of your eye turns yellow (jaundice).   You feel weak after doing your normal activities.  SEEK IMMEDIATE MEDICAL CARE IF:   You have trouble breathing.  You have fever and chills along with:  Anxiety.  Chest or back pain.  Flushed skin.  Clammy skin.  A rapid heartbeat.  Nausea.   This information is not intended to replace advice given to you by your health care provider. Make sure you discuss any questions you have with your health care provider.   Document Released: 01/20/2014 Document Reviewed: 01/20/2014 Elsevier Interactive Patient Education 2016 Elsevier Inc.  Uterine Fibroids Uterine fibroids are tissue masses (tumors). They are also called leiomyomas. They can develop inside of a woman's womb (uterus). They can grow very large. Fibroids are not cancerous  (benign). Most fibroids do not require medical treatment. HOME CARE  Keep all follow-up visits as told by your doctor. This is important.  Take medicines only as told by your doctor.  If you were prescribed a hormone treatment, take the hormone medicines exactly as told.  Do not take aspirin. It can cause bleeding.  Ask your doctor about taking iron pills and increasing the amount of dark green, leafy vegetables in your diet. These actions can help to boost your blood iron levels.  Pay close attention to your period. Tell your doctor about any changes, such as:  Increased blood flow. This may require you to use more pads or tampons than usual per month.  A change in the number of days that your period lasts per month.  A change in symptoms that come with your period, such as back pain or cramping in your belly area (abdomen). GET HELP IF:  You have pain in your back or the area between your hip bones (pelvic area) that is not controlled by medicines.  You have pain in your abdomen that is not controlled with medicines.  You have an increase in bleeding between and during periods.  You soak tampons or pads in a half hour or less.  You feel lightheaded.  You feel extra tired.  You feel weak. GET HELP RIGHT AWAY IF:   You pass out (faint).  You have a sudden increase in pelvic pain.   This information is not intended to replace advice given to you by your health care provider. Make sure you discuss any questions you have  with your health care provider.   Document Released: 02/01/2010 Document Revised: 01/20/2014 Document Reviewed: 06/28/2013 Elsevier Interactive Patient Education [employer].

## 2014-12-27 LAB — TYPE AND SCREEN
ABO/RH(D): B POS
Antibody Screen: NEGATIVE
Unit division: 0
Unit division: 0

## 2014-12-27 LAB — I-STAT CHEM 8, ED
BUN: 7 mg/dL (ref 6–20)
CALCIUM ION: 1.22 mmol/L (ref 1.12–1.23)
CHLORIDE: 104 mmol/L (ref 101–111)
CREATININE: 0.5 mg/dL (ref 0.44–1.00)
GLUCOSE: 214 mg/dL — AB (ref 65–99)
HCT: 29 % — ABNORMAL LOW (ref 36.0–46.0)
Hemoglobin: 9.9 g/dL — ABNORMAL LOW (ref 12.0–15.0)
Potassium: 3.7 mmol/L (ref 3.5–5.1)
Sodium: 137 mmol/L (ref 135–145)
TCO2: 22 mmol/L (ref 0–100)

## 2014-12-28 ENCOUNTER — Other Ambulatory Visit: Payer: Self-pay | Admitting: Obstetrics and Gynecology

## 2014-12-28 DIAGNOSIS — D259 Leiomyoma of uterus, unspecified: Secondary | ICD-10-CM

## 2015-01-18 ENCOUNTER — Ambulatory Visit
Admission: RE | Admit: 2015-01-18 | Discharge: 2015-01-18 | Disposition: A | Discharge status: Home / Self Care | Payer: Commercial Managed Care - PPO | Source: Ambulatory Visit | Attending: Obstetrics and Gynecology | Admitting: Obstetrics and Gynecology

## 2015-01-18 ENCOUNTER — Other Ambulatory Visit (HOSPITAL_COMMUNITY): Payer: Self-pay | Admitting: Interventional Radiology

## 2015-01-18 ENCOUNTER — Other Ambulatory Visit: Payer: Self-pay | Admitting: Radiology

## 2015-01-18 DIAGNOSIS — D259 Leiomyoma of uterus, unspecified: Secondary | ICD-10-CM

## 2015-01-18 NOTE — Consult Note (Signed)
Chief Complaint: Patient was seen in consultation today for  Chief Complaint  Patient presents with  . Advice Only    Consult for [place]     at the request of O'Keeffe,Michael E.  Referring Physician(s): O'Keeffe,Michael E.  History of Present Illness: Cheryl Hart is a 40 y.o. female with a one-year history of worsening menorrhagia. It has become severe and her hemoglobin has been as low as 5.8. Her periods last 6 days with 4 heavy days. She is gravida 1 and para 1 with no future pregnancy plans. She denies PID, pelvic malignancy, or pelvic radiation. She also complains of urinary frequency, urgency, and nocturia. She has back pain and pelvic pressure. During her periods, she changes pads every 2 hours. These are super pads. She has no perimenopausal symptoms. She has not had any prior intervention other than oral contraceptives.  Past Medical History  Diagnosis Date  . Diabetes mellitus   . Anxiety   . Hyperlipidemia   . Anemia   . Uterine fibroid     Past Surgical History  Procedure Laterality Date  . Cesarean section    . G1 p1      Allergies: Sulfonamide derivatives  Medications: Prior to Admission medications   Medication Sig Start Date End Date Taking? Authorizing Provider  Biotin 5000 MCG TABS Take 1 tablet by mouth daily.   Yes Historical Provider, MD  diphenhydrAMINE (BENADRYL) 25 MG tablet Take 50 mg by mouth at bedtime.   Yes Historical Provider, MD  DULoxetine (CYMBALTA) 60 MG capsule TAKE ONE CAPSULE BY MOUTH ONCE DAILY 10/06/14  Yes Yvonne R Lowne, DO  Fe Cbn-Fe Gluc-FA-B12-C-DSS (FERRALET 90) 90-1 MG TABS Take 90 mg by mouth daily. 02/21/13  Yes Rosalita Chessman, DO  glucose blood (ACCU-CHEK SMARTVIEW) test strip Use to test blood sugar 2 times daily as instructed 03/20/14  Yes Philemon Kingdom, MD  metFORMIN (GLUCOPHAGE-XR) 750 MG 24 hr tablet Take 2 tablets (1,500 mg total) BY MOUTH EVERY EVENING. 12/22/14  Yes Rosalita Chessman, DO  Multiple Vitamin  (MULTIVITAMIN WITH MINERALS) TABS tablet Take 1 tablet by mouth daily.   Yes Historical Provider, MD  OGESTREL 0.5-50 MG-MCG tablet Take 1 tablet by mouth daily. 12/22/14  Yes Historical Provider, MD  VICTOZA 18 MG/3ML SOPN INJECT 1.8 MG SUBCUTANEOUSLY DAILY 06/16/14  Yes Rosalita Chessman, DO  ACCU-CHEK FASTCLIX LANCETS MISC Use to test blood sugar 2 times daily as instruced. 03/20/14   Philemon Kingdom, MD  canagliflozin (INVOKANA) 100 MG TABS tablet Take 1 tablet (100 mg total) by mouth daily. Patient not taking: Reported on 12/26/2014 03/20/14   Philemon Kingdom, MD     Family History  Problem Relation Age of Onset  . Migraines    . Diabetes Mother   . Diabetes Father   . Diabetes Brother     Social History   Social History  . Marital Status: Single    Spouse Name: N/A  . Number of Children: N/A  . Years of Education: N/A   Occupational History  . psych tech [employer]   Social History Main Topics  . Smoking status: Never Smoker   . Smokeless tobacco: Never Used  . Alcohol Use: No  . Drug Use: No  . Sexual Activity:    Partners: Male    Birth Control/ Protection: Pill   Other Topics Concern  . Not on file   Social History Narrative   Exercise-- no     Review of  Systems: A 12 point ROS discussed and pertinent positives are indicated in the HPI above.  All other systems are negative.  Review of Systems  Vital Signs: BP 144/78 mmHg  Pulse 114  Temp(Src) 97.9 F (36.6 C) (Oral)  Resp 14  Ht 5\' 7"  (1.702 m)  Wt 200 lb (90.719 kg)  BMI 31.32 kg/m2  SpO2 100%  LMP 01/15/2015 (Exact Date)  Physical Exam  Constitutional: She is oriented to person, place, and time. She appears well-developed and well-nourished.  Cardiovascular: Regular rhythm.   tachycardic  Pulmonary/Chest: Effort normal and breath sounds normal.  Neurological: She is alert and oriented to person, place, and time.    Mallampati Score:   2  Imaging: No results found.    Ultrasound performed 07/13/2014 demonstrates a single fibroid measuring 5 cm.  Labs:  CBC:  Recent Labs  03/03/14 1033 12/25/14 0830 12/26/14 1150 12/27/14 0007  WBC 9.0 8.0 7.7  --   HGB 9.4* 6.5 Repeated and verified X2.* 6.2* 9.9*  HCT 29.7* 22.8 Repeated and verified X2.* 23.0* 29.0*  PLT 379.0 774.0* 679*  --     COAGS: No results for input(s): INR, APTT in the last 8760 hours.  BMP:  Recent Labs  03/03/14 1033 12/27/14 0007  NA 133* 137  K 3.7 3.7  CL 97 104  CO2 28  --   GLUCOSE 270* 214*  BUN 12 7  CALCIUM 10.2  --   CREATININE 0.59 0.50    LIVER FUNCTION TESTS:  Recent Labs  03/03/14 1033  BILITOT 0.2  AST 31  ALT 41*  ALKPHOS 141*  PROT 8.3  ALBUMIN 4.2    TUMOR MARKERS: No results for input(s): AFPTM, CEA, CA199, CHROMGRNA in the last 8760 hours.  Endometrial biopsy was performed 12/25/2014 resulting in benign findings.  Assessment and Plan:  Based on sonography and endometrial biopsy, Ms. Bredemeier has severe menorrhagia secondary to fibroids. This will need to be confirmed with an MRI of her pelvis with and without contrast. She is a satisfactory candidate for uterine fibroid embolization. I discussed the procedure, risks, benefits, and alternatives. She wishes to proceed with the MRI and subsequent treatment. Her questions were answered.  Thank you for this interesting consult.  I greatly enjoyed meeting Cheryl Hart and look forward to participating in their care.  A copy of this report was sent to the requesting provider on this date.  Signed: Luke Rigsbee, ART A 01/18/2015, 9:48 AM   I spent a total of  40 Minutes   in face to face in clinical consultation, greater than 50% of which was counseling/coordinating care for uterine fibroid embolization.

## 2015-01-19 LAB — CREATININE WITH EST GFR: CREATININE: 0.69 mg/dL (ref 0.50–1.10)

## 2015-01-19 LAB — BUN: BUN: 14 mg/dL (ref 7–25)

## 2015-01-22 ENCOUNTER — Ambulatory Visit (HOSPITAL_COMMUNITY)
Admission: RE | Admit: 2015-01-22 | Discharge: 2015-01-22 | Disposition: A | Discharge status: Home / Self Care | Payer: Commercial Managed Care - PPO | Source: Ambulatory Visit | Attending: Interventional Radiology | Admitting: Interventional Radiology

## 2015-01-22 DIAGNOSIS — N750 Cyst of Bartholin's gland: Secondary | ICD-10-CM | POA: Diagnosis not present

## 2015-01-22 DIAGNOSIS — R102 Pelvic and perineal pain: Secondary | ICD-10-CM | POA: Insufficient documentation

## 2015-01-22 DIAGNOSIS — D259 Leiomyoma of uterus, unspecified: Secondary | ICD-10-CM | POA: Diagnosis not present

## 2015-01-22 DIAGNOSIS — D219 Benign neoplasm of connective and other soft tissue, unspecified: Secondary | ICD-10-CM

## 2015-01-22 DIAGNOSIS — N920 Excessive and frequent menstruation with regular cycle: Secondary | ICD-10-CM | POA: Insufficient documentation

## 2015-01-22 DIAGNOSIS — Z01818 Encounter for other preprocedural examination: Secondary | ICD-10-CM | POA: Insufficient documentation

## 2015-01-22 MED ORDER — GADOBENATE DIMEGLUMINE 529 MG/ML IV SOLN
19.0000 mL | Freq: Once | INTRAVENOUS | Status: AC | PRN
  Administered 2015-01-22: 19 mL via INTRAVENOUS

## 2015-01-23 ENCOUNTER — Ambulatory Visit: Payer: Commercial Managed Care - PPO | Admitting: Family Medicine

## 2015-01-30 ENCOUNTER — Ambulatory Visit: Payer: Commercial Managed Care - PPO | Admitting: Family Medicine

## 2015-01-30 ENCOUNTER — Telehealth: Payer: Self-pay | Admitting: Family Medicine

## 2015-02-01 ENCOUNTER — Encounter: Payer: Self-pay | Admitting: Family Medicine

## 2015-02-01 ENCOUNTER — Telehealth: Payer: Self-pay | Admitting: Family Medicine

## 2015-02-01 ENCOUNTER — Ambulatory Visit (INDEPENDENT_AMBULATORY_CARE_PROVIDER_SITE_OTHER): Payer: Commercial Managed Care - PPO | Admitting: Family Medicine

## 2015-02-01 VITALS — BP 126/72 | HR 122 | Temp 98.0°F | Ht 67.0 in | Wt 197.6 lb

## 2015-02-01 DIAGNOSIS — E1151 Type 2 diabetes mellitus with diabetic peripheral angiopathy without gangrene: Secondary | ICD-10-CM

## 2015-02-01 DIAGNOSIS — E1165 Type 2 diabetes mellitus with hyperglycemia: Secondary | ICD-10-CM | POA: Diagnosis not present

## 2015-02-01 DIAGNOSIS — D500 Iron deficiency anemia secondary to blood loss (chronic): Secondary | ICD-10-CM

## 2015-02-01 DIAGNOSIS — R8290 Unspecified abnormal findings in urine: Secondary | ICD-10-CM

## 2015-02-01 DIAGNOSIS — F411 Generalized anxiety disorder: Secondary | ICD-10-CM | POA: Diagnosis not present

## 2015-02-01 DIAGNOSIS — Z114 Encounter for screening for human immunodeficiency virus [HIV]: Secondary | ICD-10-CM

## 2015-02-01 DIAGNOSIS — IMO0002 Reserved for concepts with insufficient information to code with codable children: Secondary | ICD-10-CM

## 2015-02-01 LAB — POCT URINALYSIS DIPSTICK
BILIRUBIN UA: NEGATIVE
Ketones, UA: NEGATIVE
LEUKOCYTES UA: NEGATIVE
NITRITE UA: NEGATIVE
Spec Grav, UA: 1.015
Urobilinogen, UA: 0.2
pH, UA: 6

## 2015-02-01 LAB — COMPLETE METABOLIC PANEL WITH GFR
ALT: 12 U/L (ref 6–29)
AST: 13 U/L (ref 10–30)
Albumin: 4 g/dL (ref 3.6–5.1)
Alkaline Phosphatase: 86 U/L (ref 33–115)
BUN: 7 mg/dL (ref 7–25)
CHLORIDE: 98 mmol/L (ref 98–110)
CO2: 23 mmol/L (ref 20–31)
CREATININE: 0.49 mg/dL — AB (ref 0.50–1.10)
Calcium: 10.3 mg/dL — ABNORMAL HIGH (ref 8.6–10.2)
GFR, Est Non African American: >89 mL/min (ref >=60)
GLUCOSE: 323 mg/dL — AB (ref 65–99)
Potassium: 3.9 mmol/L (ref 3.5–5.3)
SODIUM: 133 mmol/L — AB (ref 135–146)
Total Bilirubin: 0.2 mg/dL (ref 0.2–1.2)
Total Protein: 7.7 g/dL (ref 6.1–8.1)

## 2015-02-01 LAB — CBC WITH DIFFERENTIAL/PLATELET
BASOS PCT: 0.8 % (ref 0.0–3.0)
Basophils Absolute: 0.1 10*3/uL (ref 0.0–0.1)
Eosinophils Absolute: 0.1 10*3/uL (ref 0.0–0.7)
Eosinophils Relative: 1 % (ref 0.0–5.0)
HEMATOCRIT: 27.3 % — AB (ref 36.0–46.0)
Hemoglobin: 8.4 g/dL — ABNORMAL LOW (ref 12.0–15.0)
LYMPHS PCT: 31.8 % (ref 12.0–46.0)
Lymphs Abs: 2.6 10*3/uL (ref 0.7–4.0)
MCHC: 30.6 g/dL (ref 30.0–36.0)
MCV: 63.5 fl — AB (ref 78.0–100.0)
MONOS PCT: 4.7 % (ref 3.0–12.0)
Monocytes Absolute: 0.4 10*3/uL (ref 0.1–1.0)
NEUTROS ABS: 5 10*3/uL (ref 1.4–7.7)
Neutrophils Relative %: 61.7 % (ref 43.0–77.0)
PLATELETS: 298 10*3/uL (ref 150.0–400.0)
RBC: 4.3 Mil/uL (ref 3.87–5.11)
RDW: 31.2 % — AB (ref 11.5–15.5)
WBC: 8.1 10*3/uL (ref 4.0–10.5)

## 2015-02-01 LAB — HEMOGLOBIN A1C: Hgb A1c MFr Bld: 9.9 % — ABNORMAL HIGH (ref 4.6–6.5)

## 2015-02-01 LAB — LIPID PANEL
CHOL/HDL RATIO: 5
CHOLESTEROL: 174 mg/dL (ref 0–200)
HDL: 37.8 mg/dL — ABNORMAL LOW (ref >39.00)
NONHDL: 136.64
Triglycerides: 273 mg/dL — ABNORMAL HIGH (ref 0.0–149.0)
VLDL: 54.6 mg/dL — AB (ref 0.0–40.0)

## 2015-02-01 LAB — MICROALBUMIN / CREATININE URINE RATIO
CREATININE, U: 72.1 mg/dL
Microalb Creat Ratio: 24.8 mg/g (ref 0.0–30.0)
Microalb, Ur: 17.9 mg/dL — ABNORMAL HIGH (ref 0.0–1.9)

## 2015-02-01 LAB — LDL CHOLESTEROL, DIRECT: Direct LDL: 92 mg/dL

## 2015-02-01 MED ORDER — METFORMIN HCL ER 750 MG PO TB24: ORAL_TABLET | ORAL | Status: DC

## 2015-02-01 MED ORDER — ALPRAZOLAM 0.25 MG PO TABS: 0.2500 mg | ORAL_TABLET | Freq: Three times a day (TID) | ORAL | Status: DC | PRN

## 2015-02-01 MED ORDER — LIRAGLUTIDE 18 MG/3ML ~~LOC~~ SOPN: PEN_INJECTOR | SUBCUTANEOUS | Status: DC

## 2015-02-01 NOTE — Telephone Encounter (Signed)
Relation to pt: self  Call back number:[phone] Pharmacy: [facility] [place] (Phone) [phone] (Fax)          Reason for call:  Patient states pharmacy never received ALPRAZolam (XANAX) 0.25 MG tablet

## 2015-02-01 NOTE — Telephone Encounter (Signed)
Pt was no show 01/30/15 9:00am for follow up appt, pt came in 02/01/15, charge or no charge?

## 2015-02-01 NOTE — Telephone Encounter (Signed)
Rx faxed.    KP 

## 2015-02-01 NOTE — Progress Notes (Signed)
Patient ID: Cheryl Hart, female    DOB: 1975-07-28  Age: 40 y.o. MRN: [id number]    Subjective:  Subjective HPI Xiara Bradt presents for f/u anemia and dm.  She has not been checking her bs and she stopped invokanna due to frequent urination and yeast infections.     Pt having uterine ablations 1/30 at [facility].      Review of Systems  Constitutional: Negative for diaphoresis, appetite change, fatigue and unexpected weight change.  Eyes: Negative for pain, redness and visual disturbance.  Respiratory: Negative for cough, chest tightness, shortness of breath and wheezing.   Cardiovascular: Negative for chest pain, palpitations and leg swelling.  Endocrine: Negative for cold intolerance, heat intolerance, polydipsia, polyphagia and polyuria.  Genitourinary: Negative for dysuria, frequency and difficulty urinating.  Neurological: Negative for dizziness, light-headedness, numbness and headaches.    History Past Medical History  Diagnosis Date  . Diabetes mellitus   . Anxiety   . Hyperlipidemia   . Anemia   . Uterine fibroid     She has past surgical history that includes Cesarean section and g1 p1.   Her family history includes Diabetes in her brother, father, and mother.She reports that she has never smoked. She has never used smokeless tobacco. She reports that she does not drink alcohol or use illicit drugs.  Current Outpatient Prescriptions on File Prior to Visit  Medication Sig Dispense Refill  . ACCU-CHEK FASTCLIX LANCETS MISC Use to test blood sugar 2 times daily as instruced. 102 each 4  . Biotin 5000 MCG TABS Take 1 tablet by mouth daily.    . diphenhydrAMINE (BENADRYL) 25 MG tablet Take 50 mg by mouth at bedtime as needed for sleep.     . DULoxetine (CYMBALTA) 60 MG capsule TAKE ONE CAPSULE BY MOUTH ONCE DAILY 90 capsule 1  . Fe Cbn-Fe Gluc-FA-B12-C-DSS (FERRALET 90) 90-1 MG TABS Take 90 mg by mouth daily. 90 each 3  . glucose blood (ACCU-CHEK SMARTVIEW) test  strip Use to test blood sugar 2 times daily as instructed 100 each 4  . Multiple Vitamin (MULTIVITAMIN WITH MINERALS) TABS tablet Take 1 tablet by mouth daily.    . OGESTREL 0.5-50 MG-MCG tablet Take 1 tablet by mouth daily.  11   No current facility-administered medications on file prior to visit.     Objective:  Objective Physical Exam  Constitutional: She is oriented to person, place, and time. She appears well-developed and well-nourished.  HENT:  Head: Normocephalic and atraumatic.  Eyes: Conjunctivae and EOM are normal.  Neck: Normal range of motion. Neck supple. No JVD present. Carotid bruit is not present. No thyromegaly present.  Cardiovascular: Normal rate, regular rhythm and normal heart sounds.   No murmur heard. Pulmonary/Chest: Effort normal and breath sounds normal. No respiratory distress. She has no wheezes. She has no rales. She exhibits no tenderness.  Musculoskeletal: She exhibits no edema.  Neurological: She is alert and oriented to person, place, and time.  Psychiatric: She has a normal mood and affect. Her behavior is normal.  Nursing note and vitals reviewed.  BP 126/72 mmHg  Pulse 122  Temp(Src) 98 F (36.7 C) (Oral)  Ht 5\' 7"  (1.702 m)  Wt 197 lb 9.6 oz (89.631 kg)  BMI 30.94 kg/m2  SpO2 98%  LMP 01/15/2015 (Exact Date) Wt Readings from Last 3 Encounters:  02/01/15 197 lb 9.6 oz (89.631 kg)  01/18/15 200 lb (90.719 kg)  12/26/14 200 lb (90.719 kg)     Lab  Results  Component Value Date   WBC 8.1 02/01/2015   HGB 8.4* 02/01/2015   HCT 27.3* 02/01/2015   PLT 298.0 02/01/2015   GLUCOSE 214* 12/27/2014   CHOL 174 02/01/2015   TRIG 273.0* 02/01/2015   HDL 37.80* 02/01/2015   LDLDIRECT 92.0 02/01/2015   LDLCALC 138* 03/03/2014   ALT 41* 03/03/2014   AST 31 03/03/2014   NA 137 12/27/2014   K 3.7 12/27/2014   CL 104 12/27/2014   CREATININE 0.69 01/18/2015   BUN 14 01/18/2015   CO2 28 03/03/2014   TSH 2.81 03/03/2014   HGBA1C 9.9* 02/01/2015    MICROALBUR 17.9* 02/01/2015    Mr Pelvis W Wo Contrast  01/23/2015  CLINICAL DATA:  Worsening pelvic pain and menorrhagia. Uterine fibroids. Preprocedural evaluation for umbilical artery embolization. EXAM: MRI PELVIS WITHOUT AND WITH CONTRAST TECHNIQUE: Multiplanar multisequence MR imaging of the pelvis was performed both before and after administration of intravenous contrast. CONTRAST:  57mL MULTIHANCE GADOBENATE DIMEGLUMINE 529 MG/ML IV SOLN COMPARISON:  None. FINDINGS: Uterus:  Measures 10.5 x 7.0 x 8.6 cm.   Estimated volume = 316 cc Fibroids: Retroflexed uterus. An intramural fibroid is seen in the right anterior corpus which measures 5.9 cm in maximum diameter and has a submucosal component displacing the endometrium posteriorly. Intracavitary fibroids: A smaller fibroid is seen in the left corpus which appears intracavitary in location. This measures 2.2 x 2.0 cm on image 16 of series 6 and image 17 of series 4. Pedunculated fibroids: None. Fibroid contrast enhancement: Both fibroids show contrast enhancement, with the larger 5.9 cm fibroid showing an area of nonenhancement superiorly consistent with degeneration. Endometrium:  No abnormal thickening noted. Cervix:  Normal appearance. Right ovary:  Normal appearance.  No adnexal mass identified. Left ovary:  Normal appearance.  No adnexal mass identified. Free fluid:  None. Other: Incidentally noted is a 1.9 cm simple cyst in the inferior left vaginal wall, consistent with a Bartholin's cyst. IMPRESSION: 5.9 cm intramural fibroid in the upper right anterior corpus. 2.2 cm fibroid in the left corpus appears intracavitary in location. Normal appearance of both ovaries.  No adnexal mass identified. 2 cm left vaginal Bartholin's gland cyst incidentally noted. Electronically Signed   By: Earle Gell M.D.   On: 01/23/2015 08:15     Assessment & Plan:  Plan I have discontinued Ms. Mancinas's canagliflozin. I have also changed her VICTOZA to Liraglutide.  Additionally, I am having her start on ALPRAZolam. Lastly, I am having her maintain her FERRALET 90, glucose blood, ACCU-CHEK FASTCLIX LANCETS, DULoxetine, multivitamin with minerals, Biotin, diphenhydrAMINE, OGESTREL, ibuprofen, and metFORMIN.  Meds ordered this encounter  Medications  . ALPRAZolam (XANAX) 0.25 MG tablet    Sig: Take 1 tablet (0.25 mg total) by mouth 3 (three) times daily as needed for anxiety.    Dispense:  30 tablet    Refill:  0  . metFORMIN (GLUCOPHAGE-XR) 750 MG 24 hr tablet    Sig: Take 2 tablets (1,500 mg total) BY MOUTH EVERY EVENING.    Dispense:  60 tablet    Refill:  4  . Liraglutide (VICTOZA) 18 MG/3ML SOPN    Sig: INJECT 1.8 MG SUBCUTANEOUSLY DAILY    Dispense:  9 mL    Refill:  3    Problem List Items Addressed This Visit    None    Visit Diagnoses    DM (diabetes mellitus) type II uncontrolled, periph vascular disorder ([place])    -  Primary    Relevant  Medications    metFORMIN (GLUCOPHAGE-XR) 750 MG 24 hr tablet    Liraglutide (VICTOZA) 18 MG/3ML SOPN    Other Relevant Orders    COMPLETE METABOLIC PANEL WITH GFR    Hemoglobin A1c (Completed)    Lipid panel (Completed)    Microalbumin / creatinine urine ratio (Completed)    POCT urinalysis dipstick (Completed)    CBC with Differential/Platelet (Completed)    HIV antibody    Iron deficiency anemia due to chronic blood loss        Relevant Orders    COMPLETE METABOLIC PANEL WITH GFR    Hemoglobin A1c (Completed)    Lipid panel (Completed)    Microalbumin / creatinine urine ratio (Completed)    POCT urinalysis dipstick (Completed)    CBC with Differential/Platelet (Completed)    HIV antibody    Screening for HIV (human immunodeficiency virus)        Relevant Orders    HIV antibody    Anxiety state        Relevant Medications    ALPRAZolam (XANAX) 0.25 MG tablet    Abnormal urine findings        Relevant Orders    Urine culture       Follow-up: Return in about 3 months (around  05/02/2015), or if symptoms worsen or fail to improve, for diabetes II.  Garnet Koyanagi, DO

## 2015-02-01 NOTE — Progress Notes (Signed)
Pre visit review using our clinic review tool, if applicable. No additional management support is needed unless otherwise documented below in the visit note. 

## 2015-02-01 NOTE — Telephone Encounter (Signed)
no

## 2015-02-01 NOTE — Patient Instructions (Signed)

## 2015-02-02 LAB — HIV ANTIBODY (ROUTINE TESTING W REFLEX): HIV 1&2 Ab, 4th Generation: NONREACTIVE

## 2015-02-04 LAB — URINE CULTURE: Colony Count: 15000

## 2015-02-07 ENCOUNTER — Other Ambulatory Visit: Payer: Self-pay

## 2015-02-07 DIAGNOSIS — R809 Proteinuria, unspecified: Secondary | ICD-10-CM

## 2015-02-07 MED ORDER — ATORVASTATIN CALCIUM 20 MG PO TABS: 20.0000 mg | ORAL_TABLET | Freq: Every day | ORAL | Status: DC

## 2015-02-09 ENCOUNTER — Other Ambulatory Visit: Payer: Self-pay | Admitting: General Surgery

## 2015-02-12 ENCOUNTER — Encounter (HOSPITAL_COMMUNITY): Payer: Self-pay

## 2015-02-12 ENCOUNTER — Observation Stay (HOSPITAL_COMMUNITY)
Admission: RE | Admit: 2015-02-12 | Discharge: 2015-02-13 | Disposition: A | Discharge status: Home / Self Care | Payer: Commercial Managed Care - PPO | Source: Ambulatory Visit | Attending: Interventional Radiology | Admitting: Interventional Radiology

## 2015-02-12 ENCOUNTER — Ambulatory Visit (HOSPITAL_COMMUNITY)
Admission: RE | Admit: 2015-02-12 | Discharge: 2015-02-12 | Disposition: A | Discharge status: Home / Self Care | Payer: Commercial Managed Care - PPO | Source: Ambulatory Visit | Attending: Interventional Radiology | Admitting: Interventional Radiology

## 2015-02-12 VITALS — BP 140/76 | HR 106 | Temp 98.5°F | Resp 14

## 2015-02-12 DIAGNOSIS — D259 Leiomyoma of uterus, unspecified: Secondary | ICD-10-CM | POA: Diagnosis not present

## 2015-02-12 DIAGNOSIS — L299 Pruritus, unspecified: Secondary | ICD-10-CM | POA: Insufficient documentation

## 2015-02-12 DIAGNOSIS — Z7984 Long term (current) use of oral hypoglycemic drugs: Secondary | ICD-10-CM | POA: Diagnosis not present

## 2015-02-12 DIAGNOSIS — D219 Benign neoplasm of connective and other soft tissue, unspecified: Secondary | ICD-10-CM | POA: Insufficient documentation

## 2015-02-12 DIAGNOSIS — Z79899 Other long term (current) drug therapy: Secondary | ICD-10-CM | POA: Insufficient documentation

## 2015-02-12 HISTORY — PX: LAPAROSCOPIC GELPORT ASSISTED MYOMECTOMY: SHX6549

## 2015-02-12 LAB — GLUCOSE, CAPILLARY
GLUCOSE-CAPILLARY: 338 mg/dL — AB (ref 65–99)
GLUCOSE-CAPILLARY: 298 mg/dL — AB (ref 65–99)
GLUCOSE-CAPILLARY: 222 mg/dL — AB (ref 65–99)
Glucose-Capillary: 187 mg/dL — ABNORMAL HIGH (ref 65–99)

## 2015-02-12 LAB — CBC WITH DIFFERENTIAL/PLATELET
BASOS PCT: 0 %
Basophils Absolute: 0 10*3/uL (ref 0.0–0.1)
EOS ABS: 0.1 10*3/uL (ref 0.0–0.7)
EOS PCT: 1 %
HCT: 24 % — ABNORMAL LOW (ref 36.0–46.0)
HEMOGLOBIN: 7 g/dL — AB (ref 12.0–15.0)
Lymphocytes Relative: 27 %
Lymphs Abs: 2.7 10*3/uL (ref 0.7–4.0)
MCH: 18.7 pg — AB (ref 26.0–34.0)
MCHC: 29.2 g/dL — AB (ref 30.0–36.0)
MCV: 64 fL — AB (ref 78.0–100.0)
MONO ABS: 0.3 10*3/uL (ref 0.1–1.0)
Monocytes Relative: 3 %
NEUTROS ABS: 7 10*3/uL (ref 1.7–7.7)
Neutrophils Relative %: 69 %
Platelets: 548 10*3/uL — ABNORMAL HIGH (ref 150–400)
RBC: 3.75 MIL/uL — ABNORMAL LOW (ref 3.87–5.11)
RDW: 25 % — ABNORMAL HIGH (ref 11.5–15.5)
WBC: 10.1 10*3/uL (ref 4.0–10.5)

## 2015-02-12 LAB — BASIC METABOLIC PANEL
Anion gap: 16 — ABNORMAL HIGH (ref 5–15)
BUN: 8 mg/dL (ref 6–20)
CALCIUM: 9.9 mg/dL (ref 8.9–10.3)
CO2: 18 mmol/L — AB (ref 22–32)
CREATININE: 0.74 mg/dL (ref 0.44–1.00)
Chloride: 102 mmol/L (ref 101–111)
GFR calc Af Amer: >60 mL/min (ref >60)
GFR calc non Af Amer: >60 mL/min (ref >60)
GLUCOSE: 396 mg/dL — AB (ref 65–99)
Potassium: 3.9 mmol/L (ref 3.5–5.1)
Sodium: 136 mmol/L (ref 135–145)

## 2015-02-12 LAB — PROTIME-INR
INR: 1.12 (ref 0.00–1.49)
Prothrombin Time: 14.2 seconds (ref 11.6–15.2)

## 2015-02-12 LAB — HCG, SERUM, QUALITATIVE: Preg, Serum: NEGATIVE

## 2015-02-12 MED ORDER — IOHEXOL 300 MG/ML  SOLN
24.0000 mL | Freq: Once | INTRAMUSCULAR | Status: AC | PRN
  Administered 2015-02-12: 24 mL via INTRA_ARTERIAL

## 2015-02-12 MED ORDER — ONDANSETRON HCL 4 MG/2ML IJ SOLN: 4.0000 mg | Freq: Four times a day (QID) | INTRAMUSCULAR | Status: DC | PRN

## 2015-02-12 MED ORDER — CEFAZOLIN SODIUM-DEXTROSE 2-3 GM-% IV SOLR
2.0000 g | INTRAVENOUS | Status: AC
  Administered 2015-02-12: 2 g via INTRAVENOUS

## 2015-02-12 MED ORDER — DOCUSATE SODIUM 100 MG PO CAPS
100.0000 mg | ORAL_CAPSULE | Freq: Two times a day (BID) | ORAL | Status: DC
  Administered 2015-02-13: 100 mg via ORAL
  Filled 2015-02-12 (×2): qty 1

## 2015-02-12 MED ORDER — DIPHENHYDRAMINE HCL 12.5 MG/5ML PO ELIX
12.5000 mg | ORAL_SOLUTION | Freq: Four times a day (QID) | ORAL | Status: DC | PRN
  Filled 2015-02-12: qty 5

## 2015-02-12 MED ORDER — SODIUM CHLORIDE 0.9% FLUSH: 9.0000 mL | INTRAVENOUS | Status: DC | PRN

## 2015-02-12 MED ORDER — KETOROLAC TROMETHAMINE 30 MG/ML IJ SOLN
30.0000 mg | INTRAMUSCULAR | Status: AC
  Administered 2015-02-12: 30 mg via INTRAVENOUS
  Filled 2015-02-12: qty 1

## 2015-02-12 MED ORDER — SODIUM CHLORIDE 0.9% FLUSH: 3.0000 mL | INTRAVENOUS | Status: DC | PRN

## 2015-02-12 MED ORDER — MIDAZOLAM HCL 2 MG/2ML IJ SOLN
INTRAMUSCULAR | Status: AC | PRN
  Administered 2015-02-12: 0.5 mg via INTRAVENOUS
  Administered 2015-02-12: 1 mg via INTRAVENOUS
  Administered 2015-02-12 (×3): 0.5 mg via INTRAVENOUS

## 2015-02-12 MED ORDER — FENTANYL CITRATE (PF) 100 MCG/2ML IJ SOLN
INTRAMUSCULAR | Status: AC | PRN
  Administered 2015-02-12: 50 ug via INTRAVENOUS
  Administered 2015-02-12 (×2): 25 ug via INTRAVENOUS

## 2015-02-12 MED ORDER — IOHEXOL 300 MG/ML  SOLN
26.0000 mL | Freq: Once | INTRAMUSCULAR | Status: AC | PRN
  Administered 2015-02-12: 26 mL via INTRA_ARTERIAL

## 2015-02-12 MED ORDER — CEFAZOLIN SODIUM-DEXTROSE 2-3 GM-% IV SOLR
INTRAVENOUS | Status: AC
  Filled 2015-02-12: qty 50

## 2015-02-12 MED ORDER — SODIUM CHLORIDE 0.9 % IV SOLN
INTRAVENOUS | Status: DC
  Administered 2015-02-12: 08:00:00 via INTRAVENOUS

## 2015-02-12 MED ORDER — HYDROMORPHONE 1 MG/ML IV SOLN
INTRAVENOUS | Status: DC
  Administered 2015-02-12: 1.8 mg via INTRAVENOUS
  Administered 2015-02-12: 11:00:00 via INTRAVENOUS

## 2015-02-12 MED ORDER — INSULIN ASPART 100 UNIT/ML ~~LOC~~ SOLN
4.0000 [IU] | Freq: Three times a day (TID) | SUBCUTANEOUS | Status: DC
Start: 2015-02-12 — End: 2015-02-13
  Administered 2015-02-13: 4 [IU] via SUBCUTANEOUS

## 2015-02-12 MED ORDER — NALOXONE HCL 0.4 MG/ML IJ SOLN: 0.4000 mg | INTRAMUSCULAR | Status: DC | PRN

## 2015-02-12 MED ORDER — ALPRAZOLAM 0.25 MG PO TABS: 0.2500 mg | ORAL_TABLET | Freq: Three times a day (TID) | ORAL | Status: DC | PRN

## 2015-02-12 MED ORDER — KETOROLAC TROMETHAMINE 30 MG/ML IJ SOLN
30.0000 mg | Freq: Four times a day (QID) | INTRAMUSCULAR | Status: DC
  Administered 2015-02-12 – 2015-02-13 (×4): 30 mg via INTRAVENOUS
  Filled 2015-02-12 (×4): qty 1

## 2015-02-12 MED ORDER — MORPHINE SULFATE 2 MG/ML IV SOLN
INTRAVENOUS | Status: DC
  Administered 2015-02-12: 12 mg via INTRAVENOUS
  Administered 2015-02-12: 18:00:00 via INTRAVENOUS
  Administered 2015-02-13: 2.5 mg via INTRAVENOUS
  Administered 2015-02-13: 4.5 mg via INTRAVENOUS
  Administered 2015-02-13: 12 mg via INTRAVENOUS
  Filled 2015-02-12: qty 25

## 2015-02-12 MED ORDER — MIDAZOLAM HCL 2 MG/2ML IJ SOLN
INTRAMUSCULAR | Status: AC
  Filled 2015-02-12: qty 6

## 2015-02-12 MED ORDER — INSULIN ASPART 100 UNIT/ML ~~LOC~~ SOLN
0.0000 [IU] | Freq: Three times a day (TID) | SUBCUTANEOUS | Status: DC
  Administered 2015-02-12: 6 [IU] via SUBCUTANEOUS
  Administered 2015-02-13: 8 [IU] via SUBCUTANEOUS
  Filled 2015-02-12: qty 1

## 2015-02-12 MED ORDER — DIPHENHYDRAMINE HCL 25 MG PO CAPS: 50.0000 mg | ORAL_CAPSULE | Freq: Every evening | ORAL | Status: DC | PRN

## 2015-02-12 MED ORDER — SODIUM CHLORIDE 0.9 % IV SOLN: 250.0000 mL | INTRAVENOUS | Status: DC | PRN

## 2015-02-12 MED ORDER — PROMETHAZINE HCL 25 MG RE SUPP: 25.0000 mg | Freq: Three times a day (TID) | RECTAL | Status: DC | PRN

## 2015-02-12 MED ORDER — DIPHENHYDRAMINE HCL 50 MG/ML IJ SOLN
12.5000 mg | Freq: Four times a day (QID) | INTRAMUSCULAR | Status: DC | PRN
  Administered 2015-02-12: 12.5 mg via INTRAVENOUS
  Filled 2015-02-12: qty 1

## 2015-02-12 MED ORDER — HYDROMORPHONE 1 MG/ML IV SOLN
INTRAVENOUS | Status: AC
  Administered 2015-02-12: 1.25 mg
  Filled 2015-02-12: qty 25

## 2015-02-12 MED ORDER — SODIUM CHLORIDE 0.9% FLUSH
3.0000 mL | Freq: Two times a day (BID) | INTRAVENOUS | Status: DC
Start: 2015-02-12 — End: 2015-02-13
  Administered 2015-02-12: 3 mL via INTRAVENOUS

## 2015-02-12 MED ORDER — DIPHENHYDRAMINE HCL 50 MG/ML IJ SOLN
50.0000 mg | Freq: Four times a day (QID) | INTRAMUSCULAR | Status: DC | PRN
  Administered 2015-02-13: 50 mg via INTRAVENOUS
  Filled 2015-02-12 (×2): qty 1

## 2015-02-12 MED ORDER — PROMETHAZINE HCL 25 MG PO TABS: 25.0000 mg | ORAL_TABLET | Freq: Three times a day (TID) | ORAL | Status: DC | PRN

## 2015-02-12 MED ORDER — LIDOCAINE HCL 1 % IJ SOLN
INTRAMUSCULAR | Status: AC
  Filled 2015-02-12: qty 20

## 2015-02-12 MED ORDER — FENTANYL CITRATE (PF) 100 MCG/2ML IJ SOLN
INTRAMUSCULAR | Status: AC
  Filled 2015-02-12: qty 4

## 2015-02-12 MED ORDER — NORGESTREL-ETHINYL ESTRADIOL 0.5-50 MG-MCG PO TABS: 1.0000 | ORAL_TABLET | Freq: Every day | ORAL | Status: DC

## 2015-02-12 MED ORDER — ADULT MULTIVITAMIN W/MINERALS CH
1.0000 | ORAL_TABLET | Freq: Every day | ORAL | Status: DC
  Administered 2015-02-13: 1 via ORAL
  Filled 2015-02-12: qty 1

## 2015-02-12 MED ORDER — FERRALET 90 90-1 MG PO TABS: 90.0000 mg | ORAL_TABLET | Freq: Every day | ORAL | Status: DC

## 2015-02-12 MED ORDER — DULOXETINE HCL 60 MG PO CPEP
60.0000 mg | ORAL_CAPSULE | Freq: Every day | ORAL | Status: DC
  Administered 2015-02-13: 60 mg via ORAL
  Filled 2015-02-12: qty 1

## 2015-02-12 MED ORDER — IOHEXOL 300 MG/ML  SOLN
50.0000 mL | Freq: Once | INTRAMUSCULAR | Status: AC | PRN
  Administered 2015-02-12: 50 mL via INTRA_ARTERIAL

## 2015-02-12 MED ORDER — DIPHENHYDRAMINE HCL 12.5 MG/5ML PO ELIX
12.5000 mg | ORAL_SOLUTION | Freq: Four times a day (QID) | ORAL | Status: DC | PRN
  Administered 2015-02-13: 12.5 mg via ORAL
  Filled 2015-02-12: qty 5

## 2015-02-12 NOTE — Sedation Documentation (Signed)
Patient denies pain and is resting comfortably.  

## 2015-02-12 NOTE — Progress Notes (Signed)
Rowe Robert notified of patient's CBG.

## 2015-02-12 NOTE — Progress Notes (Signed)
Patient ID: Cheryl Hart, female   DOB: 01/08/1976, 40 y.o.   MRN: [id number]    Referring Physician(s): O'Keeffe,M  Chief Complaint: symptomatic uterine fibroids  Subjective:  Pt doing fairly well; has some mild pelvic cramping; had some generalized itching earlier? secondary to dilaudid- switched to morphine PCA; denies N/V  Allergies: Sulfonamide derivatives  Medications: Prior to Admission medications   Medication Sig Start Date End Date Taking? Authorizing Provider  ALPRAZolam (XANAX) 0.25 MG tablet Take 1 tablet (0.25 mg total) by mouth 3 (three) times daily as needed for anxiety. 02/01/15  Yes Rosalita Chessman, DO  Biotin 5000 MCG TABS Take 1 tablet by mouth daily.   Yes Historical Provider, MD  diphenhydrAMINE (BENADRYL) 25 MG tablet Take 50 mg by mouth at bedtime as needed for sleep.    Yes Historical Provider, MD  DULoxetine (CYMBALTA) 60 MG capsule TAKE ONE CAPSULE BY MOUTH ONCE DAILY 10/06/14  Yes Yvonne R Lowne, DO  Fe Cbn-Fe Gluc-FA-B12-C-DSS (FERRALET 90) 90-1 MG TABS Take 90 mg by mouth daily. 02/21/13  Yes Yvonne R Lowne, DO  ibuprofen (ADVIL,MOTRIN) 200 MG tablet Take 800 mg by mouth every 6 (six) hours as needed (Pain).   Yes Historical Provider, MD  Liraglutide (VICTOZA) 18 MG/3ML SOPN INJECT 1.8 MG SUBCUTANEOUSLY DAILY 02/01/15  Yes Alferd Apa Lowne, DO  metFORMIN (GLUCOPHAGE-XR) 750 MG 24 hr tablet Take 2 tablets (1,500 mg total) BY MOUTH EVERY EVENING. 02/01/15  Yes Rosalita Chessman, DO  Multiple Vitamin (MULTIVITAMIN WITH MINERALS) TABS tablet Take 1 tablet by mouth daily.   Yes Historical Provider, MD  OGESTREL 0.5-50 MG-MCG tablet Take 1 tablet by mouth daily. 12/22/14  Yes Historical Provider, MD  ACCU-CHEK FASTCLIX LANCETS MISC Use to test blood sugar 2 times daily as instruced. 03/20/14   Philemon Kingdom, MD  atorvastatin (LIPITOR) 20 MG tablet Take 1 tablet (20 mg total) by mouth daily. 02/07/15   Rosalita Chessman, DO  glucose blood (ACCU-CHEK SMARTVIEW) test strip Use to  test blood sugar 2 times daily as instructed 03/20/14   Philemon Kingdom, MD     Vital Signs: BP 140/71 mmHg  Pulse 114  Temp(Src) 97.9 F (36.6 C) (Oral)  Resp 18  SpO2 100%  LMP 01/15/2015 (Exact Date)  Physical Exam  Awake/alert; puncture site rt CFA clean, dry, soft, NT , no hematoma; intact distal pulses; abd obese, soft,+BS, mild mid pelvic tenderness Imaging: Ir Angiogram Pelvis Selective Or Supraselective  02/12/2015  INDICATION: Menorrhagia.  Fibroids. EXAM: UTERINE ARTERY EMBOLIZATION MEDICATIONS: As antibiotic prophylaxis, Ancef was ordered pre-procedure and administered intravenously within one hour of incision. The antibiotic was administered within 1 hour of the procedure ANESTHESIA/SEDATION: Fentanyl 100 mcg IV; Versed Three mg IV Moderate Sedation Time:  75 minutes The patient was continuously monitored during the procedure by the interventional radiology nurse under my direct supervision. CONTRAST:  100 cc Omnipaque 300 FLUOROSCOPY TIME:  Fluoroscopy Time: 20 minutes 48 seconds (1461 mGy). COMPLICATIONS: None immediate. PROCEDURE: Informed consent was obtained from the patient following explanation of the procedure, risks, benefits and alternatives. The patient understands, agrees and consents for the procedure. All questions were addressed. A time out was performed prior to the initiation of the procedure. Maximal barrier sterile technique utilized including caps, mask, sterile gowns, sterile gloves, large sterile drape, hand hygiene, and Betadine prep. Vessels selected:  For referring iliac artery bilateral. The right groin was prepped with Betadine in a sterile fashion, and a sterile drape was applied covering the operative field.  A sterile gown and sterile gloves were used for the procedure. A micro functional needle was inserted into the right common femoral artery and removed over a 018 wire which was up sized to a [place]. A 5-French sheath was inserted. Cobra II catheter was  advanced into the aorta. The contralateral left common iliac artery was selected. The internal iliac artery on the left was selected. Angiography was performed. A micro catheter was advanced over an 018 glide wire into the left uterine artery. Angiography was performed. Embolization was performed utilizing 1 vial 500 to 700 micron embospheres. The micro catheter was removed and flushed. A Waltman's loop was created. The ipsilateral right common iliac artery and right internal iliac artery were selected. Angiography was performed. A micro catheter was advanced over a 0018 glide wire into the right uterine artery. Angiography was performed. Embolization was again performed with 2 vials 500-700 micron embospheres. The micro catheter and Cobra catheter were removed. Right femoral angiography was performed. A exoseal device was deployed without complication and hemostasis was achieved. FINDINGS: Imaging demonstrates bilateral pelvic anatomy and bilateral uterine artery angiography. Expected anatomy is identified. No blood supply from the uterine artery to the vagina is visualized. Post embolization angiography demonstrates relative stasis of flow and pruning of the vasculature to the uterus. IMPRESSION: Successful bilateral uterine artery embolization. Electronically Signed   By: Marybelle Killings M.D.   On: 02/12/2015 16:55   Ir Angiogram Pelvis Selective Or Supraselective  02/12/2015  INDICATION: Menorrhagia.  Fibroids. EXAM: UTERINE ARTERY EMBOLIZATION MEDICATIONS: As antibiotic prophylaxis, Ancef was ordered pre-procedure and administered intravenously within one hour of incision. The antibiotic was administered within 1 hour of the procedure ANESTHESIA/SEDATION: Fentanyl 100 mcg IV; Versed Three mg IV Moderate Sedation Time:  75 minutes The patient was continuously monitored during the procedure by the interventional radiology nurse under my direct supervision. CONTRAST:  100 cc Omnipaque 300 FLUOROSCOPY TIME:   Fluoroscopy Time: 20 minutes 48 seconds (1461 mGy). COMPLICATIONS: None immediate. PROCEDURE: Informed consent was obtained from the patient following explanation of the procedure, risks, benefits and alternatives. The patient understands, agrees and consents for the procedure. All questions were addressed. A time out was performed prior to the initiation of the procedure. Maximal barrier sterile technique utilized including caps, mask, sterile gowns, sterile gloves, large sterile drape, hand hygiene, and Betadine prep. Vessels selected:  For referring iliac artery bilateral. The right groin was prepped with Betadine in a sterile fashion, and a sterile drape was applied covering the operative field. A sterile gown and sterile gloves were used for the procedure. A micro functional needle was inserted into the right common femoral artery and removed over a 018 wire which was up sized to a [place]. A 5-French sheath was inserted. Cobra II catheter was advanced into the aorta. The contralateral left common iliac artery was selected. The internal iliac artery on the left was selected. Angiography was performed. A micro catheter was advanced over an 018 glide wire into the left uterine artery. Angiography was performed. Embolization was performed utilizing 1 vial 500 to 700 micron embospheres. The micro catheter was removed and flushed. A Waltman's loop was created. The ipsilateral right common iliac artery and right internal iliac artery were selected. Angiography was performed. A micro catheter was advanced over a 0018 glide wire into the right uterine artery. Angiography was performed. Embolization was again performed with 2 vials 500-700 micron embospheres. The micro catheter and Cobra catheter were removed. Right femoral angiography was performed. A exoseal  device was deployed without complication and hemostasis was achieved. FINDINGS: Imaging demonstrates bilateral pelvic anatomy and bilateral uterine artery  angiography. Expected anatomy is identified. No blood supply from the uterine artery to the vagina is visualized. Post embolization angiography demonstrates relative stasis of flow and pruning of the vasculature to the uterus. IMPRESSION: Successful bilateral uterine artery embolization. Electronically Signed   By: Marybelle Killings M.D.   On: 02/12/2015 16:55   Ir Angiogram Selective Each Additional Vessel  02/12/2015  INDICATION: Menorrhagia.  Fibroids. EXAM: UTERINE ARTERY EMBOLIZATION MEDICATIONS: As antibiotic prophylaxis, Ancef was ordered pre-procedure and administered intravenously within one hour of incision. The antibiotic was administered within 1 hour of the procedure ANESTHESIA/SEDATION: Fentanyl 100 mcg IV; Versed Three mg IV Moderate Sedation Time:  75 minutes The patient was continuously monitored during the procedure by the interventional radiology nurse under my direct supervision. CONTRAST:  100 cc Omnipaque 300 FLUOROSCOPY TIME:  Fluoroscopy Time: 20 minutes 48 seconds (1461 mGy). COMPLICATIONS: None immediate. PROCEDURE: Informed consent was obtained from the patient following explanation of the procedure, risks, benefits and alternatives. The patient understands, agrees and consents for the procedure. All questions were addressed. A time out was performed prior to the initiation of the procedure. Maximal barrier sterile technique utilized including caps, mask, sterile gowns, sterile gloves, large sterile drape, hand hygiene, and Betadine prep. Vessels selected:  For referring iliac artery bilateral. The right groin was prepped with Betadine in a sterile fashion, and a sterile drape was applied covering the operative field. A sterile gown and sterile gloves were used for the procedure. A micro functional needle was inserted into the right common femoral artery and removed over a 018 wire which was up sized to a [place]. A 5-French sheath was inserted. Cobra II catheter was advanced into the aorta.  The contralateral left common iliac artery was selected. The internal iliac artery on the left was selected. Angiography was performed. A micro catheter was advanced over an 018 glide wire into the left uterine artery. Angiography was performed. Embolization was performed utilizing 1 vial 500 to 700 micron embospheres. The micro catheter was removed and flushed. A Waltman's loop was created. The ipsilateral right common iliac artery and right internal iliac artery were selected. Angiography was performed. A micro catheter was advanced over a 0018 glide wire into the right uterine artery. Angiography was performed. Embolization was again performed with 2 vials 500-700 micron embospheres. The micro catheter and Cobra catheter were removed. Right femoral angiography was performed. A exoseal device was deployed without complication and hemostasis was achieved. FINDINGS: Imaging demonstrates bilateral pelvic anatomy and bilateral uterine artery angiography. Expected anatomy is identified. No blood supply from the uterine artery to the vagina is visualized. Post embolization angiography demonstrates relative stasis of flow and pruning of the vasculature to the uterus. IMPRESSION: Successful bilateral uterine artery embolization. Electronically Signed   By: Marybelle Killings M.D.   On: 02/12/2015 16:55   Ir Angiogram Selective Each Additional Vessel  02/12/2015  INDICATION: Menorrhagia.  Fibroids. EXAM: UTERINE ARTERY EMBOLIZATION MEDICATIONS: As antibiotic prophylaxis, Ancef was ordered pre-procedure and administered intravenously within one hour of incision. The antibiotic was administered within 1 hour of the procedure ANESTHESIA/SEDATION: Fentanyl 100 mcg IV; Versed Three mg IV Moderate Sedation Time:  75 minutes The patient was continuously monitored during the procedure by the interventional radiology nurse under my direct supervision. CONTRAST:  100 cc Omnipaque 300 FLUOROSCOPY TIME:  Fluoroscopy Time: 20 minutes 48  seconds (1461 mGy). COMPLICATIONS: None  immediate. PROCEDURE: Informed consent was obtained from the patient following explanation of the procedure, risks, benefits and alternatives. The patient understands, agrees and consents for the procedure. All questions were addressed. A time out was performed prior to the initiation of the procedure. Maximal barrier sterile technique utilized including caps, mask, sterile gowns, sterile gloves, large sterile drape, hand hygiene, and Betadine prep. Vessels selected:  For referring iliac artery bilateral. The right groin was prepped with Betadine in a sterile fashion, and a sterile drape was applied covering the operative field. A sterile gown and sterile gloves were used for the procedure. A micro functional needle was inserted into the right common femoral artery and removed over a 018 wire which was up sized to a [place]. A 5-French sheath was inserted. Cobra II catheter was advanced into the aorta. The contralateral left common iliac artery was selected. The internal iliac artery on the left was selected. Angiography was performed. A micro catheter was advanced over an 018 glide wire into the left uterine artery. Angiography was performed. Embolization was performed utilizing 1 vial 500 to 700 micron embospheres. The micro catheter was removed and flushed. A Waltman's loop was created. The ipsilateral right common iliac artery and right internal iliac artery were selected. Angiography was performed. A micro catheter was advanced over a 0018 glide wire into the right uterine artery. Angiography was performed. Embolization was again performed with 2 vials 500-700 micron embospheres. The micro catheter and Cobra catheter were removed. Right femoral angiography was performed. A exoseal device was deployed without complication and hemostasis was achieved. FINDINGS: Imaging demonstrates bilateral pelvic anatomy and bilateral uterine artery angiography. Expected anatomy is  identified. No blood supply from the uterine artery to the vagina is visualized. Post embolization angiography demonstrates relative stasis of flow and pruning of the vasculature to the uterus. IMPRESSION: Successful bilateral uterine artery embolization. Electronically Signed   By: Marybelle Killings M.D.   On: 02/12/2015 16:55   Ir US Guide Vasc Access Right  02/12/2015  INDICATION: Menorrhagia.  Fibroids. EXAM: UTERINE ARTERY EMBOLIZATION MEDICATIONS: As antibiotic prophylaxis, Ancef was ordered pre-procedure and administered intravenously within one hour of incision. The antibiotic was administered within 1 hour of the procedure ANESTHESIA/SEDATION: Fentanyl 100 mcg IV; Versed Three mg IV Moderate Sedation Time:  75 minutes The patient was continuously monitored during the procedure by the interventional radiology nurse under my direct supervision. CONTRAST:  100 cc Omnipaque 300 FLUOROSCOPY TIME:  Fluoroscopy Time: 20 minutes 48 seconds (1461 mGy). COMPLICATIONS: None immediate. PROCEDURE: Informed consent was obtained from the patient following explanation of the procedure, risks, benefits and alternatives. The patient understands, agrees and consents for the procedure. All questions were addressed. A time out was performed prior to the initiation of the procedure. Maximal barrier sterile technique utilized including caps, mask, sterile gowns, sterile gloves, large sterile drape, hand hygiene, and Betadine prep. Vessels selected:  For referring iliac artery bilateral. The right groin was prepped with Betadine in a sterile fashion, and a sterile drape was applied covering the operative field. A sterile gown and sterile gloves were used for the procedure. A micro functional needle was inserted into the right common femoral artery and removed over a 018 wire which was up sized to a [place]. A 5-French sheath was inserted. Cobra II catheter was advanced into the aorta. The contralateral left common iliac artery was  selected. The internal iliac artery on the left was selected. Angiography was performed. A micro catheter was advanced over an 018  glide wire into the left uterine artery. Angiography was performed. Embolization was performed utilizing 1 vial 500 to 700 micron embospheres. The micro catheter was removed and flushed. A Waltman's loop was created. The ipsilateral right common iliac artery and right internal iliac artery were selected. Angiography was performed. A micro catheter was advanced over a 0018 glide wire into the right uterine artery. Angiography was performed. Embolization was again performed with 2 vials 500-700 micron embospheres. The micro catheter and Cobra catheter were removed. Right femoral angiography was performed. A exoseal device was deployed without complication and hemostasis was achieved. FINDINGS: Imaging demonstrates bilateral pelvic anatomy and bilateral uterine artery angiography. Expected anatomy is identified. No blood supply from the uterine artery to the vagina is visualized. Post embolization angiography demonstrates relative stasis of flow and pruning of the vasculature to the uterus. IMPRESSION: Successful bilateral uterine artery embolization. Electronically Signed   By: Marybelle Killings M.D.   On: 02/12/2015 16:55   Ir Embo Tumor Organ Ischemia Infarct Inc Guide Roadmapping  02/12/2015  INDICATION: Menorrhagia.  Fibroids. EXAM: UTERINE ARTERY EMBOLIZATION MEDICATIONS: As antibiotic prophylaxis, Ancef was ordered pre-procedure and administered intravenously within one hour of incision. The antibiotic was administered within 1 hour of the procedure ANESTHESIA/SEDATION: Fentanyl 100 mcg IV; Versed Three mg IV Moderate Sedation Time:  75 minutes The patient was continuously monitored during the procedure by the interventional radiology nurse under my direct supervision. CONTRAST:  100 cc Omnipaque 300 FLUOROSCOPY TIME:  Fluoroscopy Time: 20 minutes 48 seconds (1461 mGy). COMPLICATIONS:  None immediate. PROCEDURE: Informed consent was obtained from the patient following explanation of the procedure, risks, benefits and alternatives. The patient understands, agrees and consents for the procedure. All questions were addressed. A time out was performed prior to the initiation of the procedure. Maximal barrier sterile technique utilized including caps, mask, sterile gowns, sterile gloves, large sterile drape, hand hygiene, and Betadine prep. Vessels selected:  For referring iliac artery bilateral. The right groin was prepped with Betadine in a sterile fashion, and a sterile drape was applied covering the operative field. A sterile gown and sterile gloves were used for the procedure. A micro functional needle was inserted into the right common femoral artery and removed over a 018 wire which was up sized to a [place]. A 5-French sheath was inserted. Cobra II catheter was advanced into the aorta. The contralateral left common iliac artery was selected. The internal iliac artery on the left was selected. Angiography was performed. A micro catheter was advanced over an 018 glide wire into the left uterine artery. Angiography was performed. Embolization was performed utilizing 1 vial 500 to 700 micron embospheres. The micro catheter was removed and flushed. A Waltman's loop was created. The ipsilateral right common iliac artery and right internal iliac artery were selected. Angiography was performed. A micro catheter was advanced over a 0018 glide wire into the right uterine artery. Angiography was performed. Embolization was again performed with 2 vials 500-700 micron embospheres. The micro catheter and Cobra catheter were removed. Right femoral angiography was performed. A exoseal device was deployed without complication and hemostasis was achieved. FINDINGS: Imaging demonstrates bilateral pelvic anatomy and bilateral uterine artery angiography. Expected anatomy is identified. No blood supply from the  uterine artery to the vagina is visualized. Post embolization angiography demonstrates relative stasis of flow and pruning of the vasculature to the uterus. IMPRESSION: Successful bilateral uterine artery embolization. Electronically Signed   By: Marybelle Killings M.D.   On: 02/12/2015 16:55  Labs:  CBC:  Recent Labs  12/25/14 0830 12/26/14 1150 12/27/14 0007 02/01/15 1341 02/12/15 0730  WBC 8.0 7.7  --  8.1 10.1  HGB 6.5 Repeated and verified X2.* 6.2* 9.9* 8.4* 7.0*  HCT 22.8 Repeated and verified X2.* 23.0* 29.0* 27.3* 24.0*  PLT 774.0* 679*  --  298.0 548*    COAGS:  Recent Labs  02/12/15 0730  INR 1.12    BMP:  Recent Labs  03/03/14 1033 12/27/14 0007 01/18/15 1027 02/01/15 1341 02/12/15 0730  NA 133* 137  --  133* 136  K 3.7 3.7  --  3.9 3.9  CL 97 104  --  98 102  CO2 28  --   --  23 18*  GLUCOSE 270* 214*  --  323* 396*  BUN 12 7 14 7 8   CALCIUM 10.2  --   --  10.3* 9.9  CREATININE 0.59 0.50 0.69 0.49* 0.74  GFRNONAA  --   --  >89 >89 >60  GFRAA  --   --  >89 >89 >60    LIVER FUNCTION TESTS:  Recent Labs  03/03/14 1033 02/01/15 1341  BILITOT 0.2 0.2  AST 31 13  ALT 41* 12  ALKPHOS 141* 86  PROT 8.3 7.7  ALBUMIN 4.2 4.0    Assessment and Plan: Pt with symptomatic uterine fibroids, s/p bilat [place] today; for overnight observation; hydrate; check am labs; f/u with Dr. Barbie Banner in IR clinic in 2-4 weeks   Electronically Signed: D. Rowe Robert 02/12/2015, 5:23 PM   I spent a total of 15 minutes at the the patient's bedside AND on the patient's hospital floor or unit, greater than 50% of which was counseling/coordinating care for bilateral uterine artery embolization

## 2015-02-12 NOTE — Procedures (Signed)
B [place] No comp/ebl

## 2015-02-12 NOTE — Sedation Documentation (Signed)
Patients CBG at 0935 was 298.

## 2015-02-12 NOTE — Sedation Documentation (Signed)
63fr. Sheath removed from right fem artery by Dr. Barbie Banner. Hemostasis achieved using exoseal closure device and manual pressure held by Rema Fendt, RTR for 17minutes. Groin level 0, RDP +1, RPT +2.

## 2015-02-12 NOTE — H&P (Signed)
Referring Physician(s): O'Keeffe,M.  Chief Complaint: Symptomatic uterine fibroids   Subjective: Pt familiar to IR service from recent consultation with Dr. Barbie Banner on 01/18/15 regarding symptomatic uterine fibroids and deemed an appropriate candidate for bilateral uterine artery embolization. She presents today for the above procedure. She currently denies fever, chills, chest pain, dyspnea, cough, abdominal/back pain at present, nausea, vomiting, dysuria/hematuria or bloody stools. She does have occasional headaches, menorrhagia/anemia, urinary frequency/urgency and nocturia.   Allergies: Sulfonamide derivatives  Medications: Prior to Admission medications   Medication Sig Start Date End Date Taking? Authorizing Provider  ALPRAZolam (XANAX) 0.25 MG tablet Take 1 tablet (0.25 mg total) by mouth 3 (three) times daily as needed for anxiety. 02/01/15  Yes Rosalita Chessman, DO  Biotin 5000 MCG TABS Take 1 tablet by mouth daily.   Yes Historical Provider, MD  diphenhydrAMINE (BENADRYL) 25 MG tablet Take 50 mg by mouth at bedtime as needed for sleep.    Yes Historical Provider, MD  DULoxetine (CYMBALTA) 60 MG capsule TAKE ONE CAPSULE BY MOUTH ONCE DAILY 10/06/14  Yes Yvonne R Lowne, DO  Fe Cbn-Fe Gluc-FA-B12-C-DSS (FERRALET 90) 90-1 MG TABS Take 90 mg by mouth daily. 02/21/13  Yes Yvonne R Lowne, DO  ibuprofen (ADVIL,MOTRIN) 200 MG tablet Take 800 mg by mouth every 6 (six) hours as needed (Pain).   Yes Historical Provider, MD  Liraglutide (VICTOZA) 18 MG/3ML SOPN INJECT 1.8 MG SUBCUTANEOUSLY DAILY 02/01/15  Yes Alferd Apa Lowne, DO  metFORMIN (GLUCOPHAGE-XR) 750 MG 24 hr tablet Take 2 tablets (1,500 mg total) BY MOUTH EVERY EVENING. 02/01/15  Yes Rosalita Chessman, DO  Multiple Vitamin (MULTIVITAMIN WITH MINERALS) TABS tablet Take 1 tablet by mouth daily.   Yes Historical Provider, MD  OGESTREL 0.5-50 MG-MCG tablet Take 1 tablet by mouth daily. 12/22/14  Yes Historical Provider, MD  ACCU-CHEK FASTCLIX  LANCETS MISC Use to test blood sugar 2 times daily as instruced. 03/20/14   Philemon Kingdom, MD  atorvastatin (LIPITOR) 20 MG tablet Take 1 tablet (20 mg total) by mouth daily. 02/07/15   Rosalita Chessman, DO  glucose blood (ACCU-CHEK SMARTVIEW) test strip Use to test blood sugar 2 times daily as instructed 03/20/14   Philemon Kingdom, MD     Vital Signs: Blood pressure 148/87, heart rate 128, temperature 98.1, oxygen saturation 98% room air LMP 01/15/2015 (Exact Date)  Physical Exam patient awake, alert. Chest clear to auscultation bilaterally. Heart with tachycardic but regular rhythm. Abdomen obese, positive bowel sounds, nontender. Extremities with full range of motion, no edema.  Imaging: No results found.  Labs:  CBC:  Recent Labs  12/25/14 0830 12/26/14 1150 12/27/14 0007 02/01/15 1341 02/12/15 0730  WBC 8.0 7.7  --  8.1 10.1  HGB 6.5 Repeated and verified X2.* 6.2* 9.9* 8.4* 7.0*  HCT 22.8 Repeated and verified X2.* 23.0* 29.0* 27.3* 24.0*  PLT 774.0* 679*  --  298.0 548*    COAGS:  Recent Labs  02/12/15 0730  INR 1.12    BMP:  Recent Labs  03/03/14 1033 12/27/14 0007 01/18/15 1027 02/01/15 1341 02/12/15 0730  NA 133* 137  --  133* 136  K 3.7 3.7  --  3.9 3.9  CL 97 104  --  98 102  CO2 28  --   --  23 18*  GLUCOSE 270* 214*  --  323* 396*  BUN 12 7 14 7 8   CALCIUM 10.2  --   --  10.3* 9.9  CREATININE 0.59 0.50  0.69 0.49* 0.74  GFRNONAA  --   --  >89 >89 >60  GFRAA  --   --  >89 >89 >60    LIVER FUNCTION TESTS:  Recent Labs  03/03/14 1033 02/01/15 1341  BILITOT 0.2 0.2  AST 31 13  ALT 41* 12  ALKPHOS 141* 86  PROT 8.3 7.7  ALBUMIN 4.2 4.0    Assessment and Plan: Patient with history of symptomatic uterine fibroids and recent consultation by Dr. Barbie Banner on 01/18/15. She was deemed an appropriate candidate for bilateral uterine artery embolization and presents today for the above procedure. Details/risks of procedure, including not limited to,  internal bleeding, infection, nontarget embolization discussed with patient with her understanding and consent. Will initiate sliding scale insulin for glycemic control. For overnight observation postprocedure.    Electronically Signed: D. Rowe Robert 02/12/2015, 8:30 AM   I spent a total of 30 minutes at the the patient's bedside AND on the patient's hospital floor or unit, greater than 50% of which was counseling/coordinating care for bilateral uterine artery embolization

## 2015-02-13 ENCOUNTER — Other Ambulatory Visit: Payer: Self-pay | Admitting: General Surgery

## 2015-02-13 DIAGNOSIS — D259 Leiomyoma of uterus, unspecified: Secondary | ICD-10-CM | POA: Diagnosis not present

## 2015-02-13 LAB — GLUCOSE, CAPILLARY
Glucose-Capillary: 310 mg/dL — ABNORMAL HIGH (ref 65–99)
Glucose-Capillary: 279 mg/dL — ABNORMAL HIGH (ref 65–99)

## 2015-02-13 MED ORDER — ONDANSETRON HCL 8 MG PO TABS: 8.0000 mg | ORAL_TABLET | Freq: Three times a day (TID) | ORAL | Status: DC | PRN

## 2015-02-13 MED ORDER — IBUPROFEN 200 MG PO TABS: 600.0000 mg | ORAL_TABLET | Freq: Three times a day (TID) | ORAL | Status: DC | PRN

## 2015-02-13 MED ORDER — DOCUSATE SODIUM 100 MG PO CAPS: 100.0000 mg | ORAL_CAPSULE | Freq: Two times a day (BID) | ORAL | Status: DC | PRN

## 2015-02-13 MED ORDER — HYDROCODONE-ACETAMINOPHEN 5-325 MG PO TABS: 1.0000 | ORAL_TABLET | ORAL | Status: DC | PRN

## 2015-02-13 NOTE — Discharge Instructions (Signed)
Do NOT take your metformin until 2-1 due to contrast dye give on 1-30  Uterine Artery Embolization for Fibroids, Care After Refer to this sheet in the next few weeks. These instructions provide you with information on caring for yourself after your procedure. Your health care provider may also give you more specific instructions. Your treatment has been planned according to current medical practices, but problems sometimes occur. Call your health care provider if you have any problems or questions after your procedure. WHAT TO EXPECT AFTER THE PROCEDURE After your procedure, it is typical to have cramping in the pelvis. You will be given pain medicine to control it. HOME CARE INSTRUCTIONS  Only take over-the-counter or prescription medicines for pain, discomfort, or fever as directed by your health care provider.  Do not take aspirin. It can cause bleeding.  Follow your health care provider's advice regarding medicines given to you, diet, activity, and when to begin sexual activity.  See your health care provider for follow-up care as directed. SEEK MEDICAL CARE IF:  You have a fever.  You have redness, swelling, and pain around your incision site.  You have pus draining from your incision.  You have a rash. SEEK IMMEDIATE MEDICAL CARE IF:  You have bleeding from your incision site.  You have difficulty breathing.  You have chest pain.  You have severe abdominal pain.  You have leg pain.  You become dizzy and faint.   This information is not intended to replace advice given to you by your health care provider. Make sure you discuss any questions you have with your health care provider.   Document Released: 10/20/2012 Document Reviewed: 10/20/2012 Elsevier Interactive Patient Education 2016 [place]. Uterine Artery Embolization for Fibroids Uterine artery embolization is a nonsurgical treatment to shrink fibroids. A thin plastic tube (catheter) is used to inject  material that blocks off the blood supply to the fibroid, which causes the fibroid to shrink. LET [facility] CARE PROVIDER KNOW ABOUT:  Any allergies you have.  All medicines you are taking, including vitamins, herbs, eye drops, creams, and over-the-counter medicines.  Previous problems you or members of your family have had with the use of anesthetics.  Any blood disorders you have.  Previous surgeries you have had.  Medical conditions you have. RISKS AND COMPLICATIONS  Injury to the uterus from decreased blood supply  Infection.  Blood infection (septicemia).  Lack of menstrual periods (amenorrhea).  Death of tissue cells (necrosis) around your bladder or vulva.  Development of a hole between organs or from an organ to the surface of your skin (fistula).  Blood clot in the legs (deep vein thrombosis) or lung (pulmonary embolus). BEFORE THE PROCEDURE  Ask your health care provider about changing or stopping your regular medicines.   Do not take aspirin or blood thinners (anticoagulants) for 1 week before the surgery or as directed by your health care provider.  Do not eat or drink anything for 8 hours before the surgery or as directed by your health care provider.   Empty your bladder before the procedure begins. PROCEDURE   An IV tube will be placed into one of your veins. This will be used to give you a sedative and pain medication (conscious sedation).  You will be given a medicine that numbs the area (local anesthetic).  A small cut will be made in your groin. A catheter is then inserted into the main artery of your leg.  The catheter will be guided through the  artery to your uterus. A series of images will be taken while dye is injected through the catheter in your groin. X-rays are taken at the same time. This is done to provide a road map of the blood supply to your uterus and fibroids.  Tiny plastic spheres, about the size of sand grains, will be injected  through the catheter. Metal coils may be used to help block the artery. The particles will lodge in tiny branches of the uterine artery that supplies blood to the fibroids.  The procedure is repeated on the artery that supplies the other side of the uterus.  The catheter is then removed and pressure is held to stop any bleeding. No stitches are needed.  A dressing is then placed over the cut (incision). AFTER THE PROCEDURE  You will be taken to a recovery area where your progress will be monitored until you are awake, stable, and taking fluids well. If there are no other problems, you will then be moved to a regular hospital room.  You will be observed overnight in the hospital.  You will have cramping that should be controlled with pain medication.   This information is not intended to replace advice given to you by your health care provider. Make sure you discuss any questions you have with your health care provider.   Document Released: 03/17/2005 Document Revised: 10/20/2012 Document Reviewed: 07/15/2012 Elsevier Interactive Patient Education [employer].

## 2015-02-13 NOTE — Progress Notes (Signed)
Patient d/c home,stable.No c/o pain.

## 2015-02-13 NOTE — Progress Notes (Signed)
Discharge instructions given to patient,verbalized understanding. Prescriptions handed to patient. Reminded patient to start Metfromin tomorrow due to contrasy dye,patient understood. Teach back utilized. Denies pain.

## 2015-02-13 NOTE — Discharge Summary (Signed)
Patient ID: Cheryl Hart MRN: [id number] DOB/AGE: [id number] 42 y.o.  Admit date: 02/12/2015 Discharge date: 02/13/2015  Admission Diagnoses: 1. symptomatic uterine fibroids  Discharge Diagnoses:  Active Problems:   Fibroids   Discharged Condition: good  Hospital Course: The patient was admitted and the patient underwent a uterine artery embolization.  She tolerated this procedure well.  On post procedure day 1, she was tolerating a solid diet and her pain was well controlled with only minimal cramping.  She was felt stable for DC home.  Consults: None  Treatments: procedures: uterine artery embolization  Discharge Exam: Blood pressure 140/76, pulse 106, temperature 98.5 F (36.9 C), temperature source Oral, resp. rate 18, last menstrual period 01/15/2015, SpO2 100 %. General appearance: alert, cooperative and no distress Resp: clear to auscultation bilaterally Cardio: regular rate and rhythm GI: soft, non-tender; bowel sounds normal; no masses,  no organomegaly and right inguinal site is covered and dry.  nontender over angio site  Disposition: 01-Home or Self Care     Medication List    TAKE these medications        ACCU-CHEK FASTCLIX LANCETS Misc  Use to test blood sugar 2 times daily as instruced.     ALPRAZolam 0.25 MG tablet  Commonly known as:  XANAX  Take 1 tablet (0.25 mg total) by mouth 3 (three) times daily as needed for anxiety.     atorvastatin 20 MG tablet  Commonly known as:  LIPITOR  Take 1 tablet (20 mg total) by mouth daily.     Biotin 5000 MCG Tabs  Take 1 tablet by mouth daily.     diphenhydrAMINE 25 MG tablet  Commonly known as:  BENADRYL  Take 50 mg by mouth at bedtime as needed for sleep.     docusate sodium 100 MG capsule  Commonly known as:  COLACE  Take 1 capsule (100 mg total) by mouth 2 (two) times daily as needed for mild constipation.     DULoxetine 60 MG capsule  Commonly known as:  CYMBALTA  TAKE ONE CAPSULE BY MOUTH  ONCE DAILY     FERRALET 90 90-1 MG Tabs  Take 90 mg by mouth daily.     glucose blood test strip  Commonly known as:  ACCU-CHEK SMARTVIEW  Use to test blood sugar 2 times daily as instructed     HYDROcodone-acetaminophen 5-325 MG tablet  Commonly known as:  NORCO  Take 1-2 tablets by mouth every 4 (four) hours as needed for moderate pain.     ibuprofen 200 MG tablet  Commonly known as:  ADVIL,MOTRIN  Take 800 mg by mouth every 6 (six) hours as needed (Pain).     ibuprofen 200 MG tablet  Commonly known as:  ADVIL  Take 3 tablets (600 mg total) by mouth every 8 (eight) hours as needed.     Liraglutide 18 MG/3ML Sopn  Commonly known as:  VICTOZA  INJECT 1.8 MG SUBCUTANEOUSLY DAILY     metFORMIN 750 MG 24 hr tablet (may resume on 2-1)  Commonly known as:  GLUCOPHAGE-XR  Take 2 tablets (1,500 mg total) BY MOUTH EVERY EVENING.     multivitamin with minerals Tabs tablet  Take 1 tablet by mouth daily.     OGESTREL 0.5-50 MG-MCG tablet  Generic drug:  norgestrel-ethinyl estradiol  Take 1 tablet by mouth daily.     ondansetron 8 MG tablet  Commonly known as:  ZOFRAN  Take 1 tablet (8 mg total) by mouth every 8 (eight)  hours as needed for nausea or vomiting.           Follow-up Information    Follow up with HOSS, ART A, MD In 3 weeks.   Specialty:  Interventional Radiology   Why:  our office will call you   Contact information:   [address] [place] [address] [id number]        Electronically Signed: Henreitta Cea 02/13/2015, 8:53 AM   I have spent Less Than 30 Minutes discharging Cheryl Hart.

## 2015-02-19 ENCOUNTER — Encounter: Payer: Self-pay | Admitting: *Deleted

## 2015-02-20 ENCOUNTER — Other Ambulatory Visit: Payer: Self-pay | Admitting: Family Medicine

## 2015-02-20 NOTE — Telephone Encounter (Signed)
Last seen and filled 02/01/15 #30  Please advise     KP

## 2015-03-01 ENCOUNTER — Ambulatory Visit
Admission: RE | Admit: 2015-03-01 | Discharge: 2015-03-01 | Disposition: A | Discharge status: Home / Self Care | Payer: Commercial Managed Care - PPO | Source: Ambulatory Visit | Attending: General Surgery | Admitting: General Surgery

## 2015-03-01 DIAGNOSIS — D259 Leiomyoma of uterus, unspecified: Secondary | ICD-10-CM

## 2015-03-01 NOTE — Progress Notes (Signed)
Patient ID: Cheryl Hart, female   DOB: 1975-08-09, 40 y.o.   MRN: [id number]    Chief Complaint: Patient was seen in consultation today for No chief complaint on file.  at the request of Leaman Abe  Referring Physician(s): Iveth Heidemann  History of Present Illness: Cheryl Hart is a 40 y.o. female who is 2 weeks status post uterine artery embolization. She has had only minimal spotting but denies significant pain or fevers. She did have some cramping after the procedure but this has improved. She denies any foul discharge. She feels well and is planning to return to work this evening.  Past Medical History  Diagnosis Date  . Diabetes mellitus   . Anxiety   . Hyperlipidemia   . Anemia   . Uterine fibroid     Past Surgical History  Procedure Laterality Date  . Cesarean section    . G1 p1    . Cholecystectomy      Allergies: Sulfonamide derivatives  Medications: Prior to Admission medications   Medication Sig Start Date End Date Taking? Authorizing Provider  Biotin 5000 MCG TABS Take 1 tablet by mouth daily.   Yes Historical Provider, MD  diphenhydrAMINE (BENADRYL) 25 MG tablet Take 50 mg by mouth at bedtime as needed for sleep.    Yes Historical Provider, MD  DULoxetine (CYMBALTA) 60 MG capsule TAKE ONE CAPSULE BY MOUTH ONCE DAILY 10/06/14  Yes Yvonne R Lowne, DO  Fe Cbn-Fe Gluc-FA-B12-C-DSS (FERRALET 90) 90-1 MG TABS Take 90 mg by mouth daily. 02/21/13  Yes Yvonne R Lowne, DO  ibuprofen (ADVIL,MOTRIN) 200 MG tablet Take 800 mg by mouth every 6 (six) hours as needed (Pain).   Yes Historical Provider, MD  Liraglutide (VICTOZA) 18 MG/3ML SOPN INJECT 1.8 MG SUBCUTANEOUSLY DAILY 02/01/15  Yes Alferd Apa Lowne, DO  metFORMIN (GLUCOPHAGE-XR) 750 MG 24 hr tablet Take 2 tablets (1,500 mg total) BY MOUTH EVERY EVENING. 02/01/15  Yes Rosalita Chessman, DO  Multiple Vitamin (MULTIVITAMIN WITH MINERALS) TABS tablet Take 1 tablet by mouth daily.   Yes Historical Provider, MD  OGESTREL 0.5-50  MG-MCG tablet Take 1 tablet by mouth daily. 12/22/14  Yes Historical Provider, MD  ACCU-CHEK FASTCLIX LANCETS MISC Use to test blood sugar 2 times daily as instruced. 03/20/14   Philemon Kingdom, MD  ALPRAZolam Duanne Moron) 0.25 MG tablet TAKE ONE TABLET BY MOUTH THREE TIMES A DAY AS NEEDED 02/20/15   Rosalita Chessman, DO  atorvastatin (LIPITOR) 20 MG tablet Take 1 tablet (20 mg total) by mouth daily. 02/07/15   Rosalita Chessman, DO  docusate sodium (COLACE) 100 MG capsule Take 1 capsule (100 mg total) by mouth 2 (two) times daily as needed for mild constipation. 02/13/15   Saverio Danker, PA-C  glucose blood (ACCU-CHEK SMARTVIEW) test strip Use to test blood sugar 2 times daily as instructed 03/20/14   Philemon Kingdom, MD  HYDROcodone-acetaminophen (NORCO) 5-325 MG tablet Take 1-2 tablets by mouth every 4 (four) hours as needed for moderate pain. 02/13/15   Saverio Danker, PA-C  ibuprofen (ADVIL) 200 MG tablet Take 3 tablets (600 mg total) by mouth every 8 (eight) hours as needed. 02/13/15   Saverio Danker, PA-C  ondansetron (ZOFRAN) 8 MG tablet Take 1 tablet (8 mg total) by mouth every 8 (eight) hours as needed for nausea or vomiting. 02/13/15   Saverio Danker, PA-C     Family History  Problem Relation Age of Onset  . Migraines    . Diabetes Mother   . Diabetes Father   .  Diabetes Brother     Social History   Social History  . Marital Status: Single    Spouse Name: N/A  . Number of Children: N/A  . Years of Education: N/A   Occupational History  . psych tech [employer]   Social History Main Topics  . Smoking status: Never Smoker   . Smokeless tobacco: Never Used  . Alcohol Use: No  . Drug Use: No  . Sexual Activity:    Partners: Male    Birth Control/ Protection: Pill   Other Topics Concern  . None   Social History Narrative   Exercise-- no     Review of Systems: A 12 point ROS discussed and pertinent positives are indicated in the HPI above.  All other systems are  negative.  Review of Systems  Vital Signs: BP 140/76 mmHg  Pulse 106  Temp(Src) 98.5 F (36.9 C) (Oral)  Resp 14  SpO2 98%  LMP 01/15/2015 (Exact Date)  Physical Exam  Constitutional: She is oriented to person, place, and time. She appears well-developed and well-nourished.  Cardiovascular: Normal rate and regular rhythm.   Pulmonary/Chest: Effort normal and breath sounds normal.  Musculoskeletal:  Right groin is clean and dry. No evidence of hemorrhage. Femoral, dorsalis pedis, and posterior tibial pulses are 2+.  Neurological: She is alert and oriented to person, place, and time.    Mallampati Score:  MD Evaluation Airway: WNL Heart: WNL Abdomen: WNL Chest/ Lungs: WNL ASA  Classification: 2 Mallampati/Airway Score: Two  Imaging: Ir Angiogram Pelvis Selective Or Supraselective  02/12/2015  INDICATION: Menorrhagia.  Fibroids. EXAM: UTERINE ARTERY EMBOLIZATION MEDICATIONS: As antibiotic prophylaxis, Ancef was ordered pre-procedure and administered intravenously within one hour of incision. The antibiotic was administered within 1 hour of the procedure ANESTHESIA/SEDATION: Fentanyl 100 mcg IV; Versed Three mg IV Moderate Sedation Time:  75 minutes The patient was continuously monitored during the procedure by the interventional radiology nurse under my direct supervision. CONTRAST:  100 cc Omnipaque 300 FLUOROSCOPY TIME:  Fluoroscopy Time: 20 minutes 48 seconds (1461 mGy). COMPLICATIONS: None immediate. PROCEDURE: Informed consent was obtained from the patient following explanation of the procedure, risks, benefits and alternatives. The patient understands, agrees and consents for the procedure. All questions were addressed. A time out was performed prior to the initiation of the procedure. Maximal barrier sterile technique utilized including caps, mask, sterile gowns, sterile gloves, large sterile drape, hand hygiene, and Betadine prep. Vessels selected:  For referring iliac artery  bilateral. The right groin was prepped with Betadine in a sterile fashion, and a sterile drape was applied covering the operative field. A sterile gown and sterile gloves were used for the procedure. A micro functional needle was inserted into the right common femoral artery and removed over a 018 wire which was up sized to a [place]. A 5-French sheath was inserted. Cobra II catheter was advanced into the aorta. The contralateral left common iliac artery was selected. The internal iliac artery on the left was selected. Angiography was performed. A micro catheter was advanced over an 018 glide wire into the left uterine artery. Angiography was performed. Embolization was performed utilizing 1 vial 500 to 700 micron embospheres. The micro catheter was removed and flushed. A Waltman's loop was created. The ipsilateral right common iliac artery and right internal iliac artery were selected. Angiography was performed. A micro catheter was advanced over a 0018 glide wire into the right uterine artery. Angiography was performed. Embolization was again performed with 2 vials  500-700 micron embospheres. The micro catheter and Cobra catheter were removed. Right femoral angiography was performed. A exoseal device was deployed without complication and hemostasis was achieved. FINDINGS: Imaging demonstrates bilateral pelvic anatomy and bilateral uterine artery angiography. Expected anatomy is identified. No blood supply from the uterine artery to the vagina is visualized. Post embolization angiography demonstrates relative stasis of flow and pruning of the vasculature to the uterus. IMPRESSION: Successful bilateral uterine artery embolization. Electronically Signed   By: Marybelle Killings M.D.   On: 02/12/2015 16:55   Ir Angiogram Pelvis Selective Or Supraselective  02/12/2015  INDICATION: Menorrhagia.  Fibroids. EXAM: UTERINE ARTERY EMBOLIZATION MEDICATIONS: As antibiotic prophylaxis, Ancef was ordered pre-procedure and  administered intravenously within one hour of incision. The antibiotic was administered within 1 hour of the procedure ANESTHESIA/SEDATION: Fentanyl 100 mcg IV; Versed Three mg IV Moderate Sedation Time:  75 minutes The patient was continuously monitored during the procedure by the interventional radiology nurse under my direct supervision. CONTRAST:  100 cc Omnipaque 300 FLUOROSCOPY TIME:  Fluoroscopy Time: 20 minutes 48 seconds (1461 mGy). COMPLICATIONS: None immediate. PROCEDURE: Informed consent was obtained from the patient following explanation of the procedure, risks, benefits and alternatives. The patient understands, agrees and consents for the procedure. All questions were addressed. A time out was performed prior to the initiation of the procedure. Maximal barrier sterile technique utilized including caps, mask, sterile gowns, sterile gloves, large sterile drape, hand hygiene, and Betadine prep. Vessels selected:  For referring iliac artery bilateral. The right groin was prepped with Betadine in a sterile fashion, and a sterile drape was applied covering the operative field. A sterile gown and sterile gloves were used for the procedure. A micro functional needle was inserted into the right common femoral artery and removed over a 018 wire which was up sized to a [place]. A 5-French sheath was inserted. Cobra II catheter was advanced into the aorta. The contralateral left common iliac artery was selected. The internal iliac artery on the left was selected. Angiography was performed. A micro catheter was advanced over an 018 glide wire into the left uterine artery. Angiography was performed. Embolization was performed utilizing 1 vial 500 to 700 micron embospheres. The micro catheter was removed and flushed. A Waltman's loop was created. The ipsilateral right common iliac artery and right internal iliac artery were selected. Angiography was performed. A micro catheter was advanced over a 0018 glide wire into  the right uterine artery. Angiography was performed. Embolization was again performed with 2 vials 500-700 micron embospheres. The micro catheter and Cobra catheter were removed. Right femoral angiography was performed. A exoseal device was deployed without complication and hemostasis was achieved. FINDINGS: Imaging demonstrates bilateral pelvic anatomy and bilateral uterine artery angiography. Expected anatomy is identified. No blood supply from the uterine artery to the vagina is visualized. Post embolization angiography demonstrates relative stasis of flow and pruning of the vasculature to the uterus. IMPRESSION: Successful bilateral uterine artery embolization. Electronically Signed   By: Marybelle Killings M.D.   On: 02/12/2015 16:55   Ir Angiogram Selective Each Additional Vessel  02/12/2015  INDICATION: Menorrhagia.  Fibroids. EXAM: UTERINE ARTERY EMBOLIZATION MEDICATIONS: As antibiotic prophylaxis, Ancef was ordered pre-procedure and administered intravenously within one hour of incision. The antibiotic was administered within 1 hour of the procedure ANESTHESIA/SEDATION: Fentanyl 100 mcg IV; Versed Three mg IV Moderate Sedation Time:  75 minutes The patient was continuously monitored during the procedure by the interventional radiology nurse under my direct supervision. CONTRAST:  100 cc Omnipaque 300 FLUOROSCOPY TIME:  Fluoroscopy Time: 20 minutes 48 seconds (1461 mGy). COMPLICATIONS: None immediate. PROCEDURE: Informed consent was obtained from the patient following explanation of the procedure, risks, benefits and alternatives. The patient understands, agrees and consents for the procedure. All questions were addressed. A time out was performed prior to the initiation of the procedure. Maximal barrier sterile technique utilized including caps, mask, sterile gowns, sterile gloves, large sterile drape, hand hygiene, and Betadine prep. Vessels selected:  For referring iliac artery bilateral. The right groin was  prepped with Betadine in a sterile fashion, and a sterile drape was applied covering the operative field. A sterile gown and sterile gloves were used for the procedure. A micro functional needle was inserted into the right common femoral artery and removed over a 018 wire which was up sized to a [place]. A 5-French sheath was inserted. Cobra II catheter was advanced into the aorta. The contralateral left common iliac artery was selected. The internal iliac artery on the left was selected. Angiography was performed. A micro catheter was advanced over an 018 glide wire into the left uterine artery. Angiography was performed. Embolization was performed utilizing 1 vial 500 to 700 micron embospheres. The micro catheter was removed and flushed. A Waltman's loop was created. The ipsilateral right common iliac artery and right internal iliac artery were selected. Angiography was performed. A micro catheter was advanced over a 0018 glide wire into the right uterine artery. Angiography was performed. Embolization was again performed with 2 vials 500-700 micron embospheres. The micro catheter and Cobra catheter were removed. Right femoral angiography was performed. A exoseal device was deployed without complication and hemostasis was achieved. FINDINGS: Imaging demonstrates bilateral pelvic anatomy and bilateral uterine artery angiography. Expected anatomy is identified. No blood supply from the uterine artery to the vagina is visualized. Post embolization angiography demonstrates relative stasis of flow and pruning of the vasculature to the uterus. IMPRESSION: Successful bilateral uterine artery embolization. Electronically Signed   By: Marybelle Killings M.D.   On: 02/12/2015 16:55   Ir Angiogram Selective Each Additional Vessel  02/12/2015  INDICATION: Menorrhagia.  Fibroids. EXAM: UTERINE ARTERY EMBOLIZATION MEDICATIONS: As antibiotic prophylaxis, Ancef was ordered pre-procedure and administered intravenously within one hour  of incision. The antibiotic was administered within 1 hour of the procedure ANESTHESIA/SEDATION: Fentanyl 100 mcg IV; Versed Three mg IV Moderate Sedation Time:  75 minutes The patient was continuously monitored during the procedure by the interventional radiology nurse under my direct supervision. CONTRAST:  100 cc Omnipaque 300 FLUOROSCOPY TIME:  Fluoroscopy Time: 20 minutes 48 seconds (1461 mGy). COMPLICATIONS: None immediate. PROCEDURE: Informed consent was obtained from the patient following explanation of the procedure, risks, benefits and alternatives. The patient understands, agrees and consents for the procedure. All questions were addressed. A time out was performed prior to the initiation of the procedure. Maximal barrier sterile technique utilized including caps, mask, sterile gowns, sterile gloves, large sterile drape, hand hygiene, and Betadine prep. Vessels selected:  For referring iliac artery bilateral. The right groin was prepped with Betadine in a sterile fashion, and a sterile drape was applied covering the operative field. A sterile gown and sterile gloves were used for the procedure. A micro functional needle was inserted into the right common femoral artery and removed over a 018 wire which was up sized to a [place]. A 5-French sheath was inserted. Cobra II catheter was advanced into the aorta. The contralateral left common iliac artery was selected. The internal iliac  artery on the left was selected. Angiography was performed. A micro catheter was advanced over an 018 glide wire into the left uterine artery. Angiography was performed. Embolization was performed utilizing 1 vial 500 to 700 micron embospheres. The micro catheter was removed and flushed. A Waltman's loop was created. The ipsilateral right common iliac artery and right internal iliac artery were selected. Angiography was performed. A micro catheter was advanced over a 0018 glide wire into the right uterine artery. Angiography was  performed. Embolization was again performed with 2 vials 500-700 micron embospheres. The micro catheter and Cobra catheter were removed. Right femoral angiography was performed. A exoseal device was deployed without complication and hemostasis was achieved. FINDINGS: Imaging demonstrates bilateral pelvic anatomy and bilateral uterine artery angiography. Expected anatomy is identified. No blood supply from the uterine artery to the vagina is visualized. Post embolization angiography demonstrates relative stasis of flow and pruning of the vasculature to the uterus. IMPRESSION: Successful bilateral uterine artery embolization. Electronically Signed   By: Marybelle Killings M.D.   On: 02/12/2015 16:55   Ir US Guide Vasc Access Right  02/12/2015  INDICATION: Menorrhagia.  Fibroids. EXAM: UTERINE ARTERY EMBOLIZATION MEDICATIONS: As antibiotic prophylaxis, Ancef was ordered pre-procedure and administered intravenously within one hour of incision. The antibiotic was administered within 1 hour of the procedure ANESTHESIA/SEDATION: Fentanyl 100 mcg IV; Versed Three mg IV Moderate Sedation Time:  75 minutes The patient was continuously monitored during the procedure by the interventional radiology nurse under my direct supervision. CONTRAST:  100 cc Omnipaque 300 FLUOROSCOPY TIME:  Fluoroscopy Time: 20 minutes 48 seconds (1461 mGy). COMPLICATIONS: None immediate. PROCEDURE: Informed consent was obtained from the patient following explanation of the procedure, risks, benefits and alternatives. The patient understands, agrees and consents for the procedure. All questions were addressed. A time out was performed prior to the initiation of the procedure. Maximal barrier sterile technique utilized including caps, mask, sterile gowns, sterile gloves, large sterile drape, hand hygiene, and Betadine prep. Vessels selected:  For referring iliac artery bilateral. The right groin was prepped with Betadine in a sterile fashion, and a sterile  drape was applied covering the operative field. A sterile gown and sterile gloves were used for the procedure. A micro functional needle was inserted into the right common femoral artery and removed over a 018 wire which was up sized to a [place]. A 5-French sheath was inserted. Cobra II catheter was advanced into the aorta. The contralateral left common iliac artery was selected. The internal iliac artery on the left was selected. Angiography was performed. A micro catheter was advanced over an 018 glide wire into the left uterine artery. Angiography was performed. Embolization was performed utilizing 1 vial 500 to 700 micron embospheres. The micro catheter was removed and flushed. A Waltman's loop was created. The ipsilateral right common iliac artery and right internal iliac artery were selected. Angiography was performed. A micro catheter was advanced over a 0018 glide wire into the right uterine artery. Angiography was performed. Embolization was again performed with 2 vials 500-700 micron embospheres. The micro catheter and Cobra catheter were removed. Right femoral angiography was performed. A exoseal device was deployed without complication and hemostasis was achieved. FINDINGS: Imaging demonstrates bilateral pelvic anatomy and bilateral uterine artery angiography. Expected anatomy is identified. No blood supply from the uterine artery to the vagina is visualized. Post embolization angiography demonstrates relative stasis of flow and pruning of the vasculature to the uterus. IMPRESSION: Successful bilateral uterine artery embolization. Electronically Signed  By: Marybelle Killings M.D.   On: 02/12/2015 16:55   Ir Embo Tumor Organ Ischemia Infarct Inc Guide Roadmapping  02/12/2015  INDICATION: Menorrhagia.  Fibroids. EXAM: UTERINE ARTERY EMBOLIZATION MEDICATIONS: As antibiotic prophylaxis, Ancef was ordered pre-procedure and administered intravenously within one hour of incision. The antibiotic was administered  within 1 hour of the procedure ANESTHESIA/SEDATION: Fentanyl 100 mcg IV; Versed Three mg IV Moderate Sedation Time:  75 minutes The patient was continuously monitored during the procedure by the interventional radiology nurse under my direct supervision. CONTRAST:  100 cc Omnipaque 300 FLUOROSCOPY TIME:  Fluoroscopy Time: 20 minutes 48 seconds (1461 mGy). COMPLICATIONS: None immediate. PROCEDURE: Informed consent was obtained from the patient following explanation of the procedure, risks, benefits and alternatives. The patient understands, agrees and consents for the procedure. All questions were addressed. A time out was performed prior to the initiation of the procedure. Maximal barrier sterile technique utilized including caps, mask, sterile gowns, sterile gloves, large sterile drape, hand hygiene, and Betadine prep. Vessels selected:  For referring iliac artery bilateral. The right groin was prepped with Betadine in a sterile fashion, and a sterile drape was applied covering the operative field. A sterile gown and sterile gloves were used for the procedure. A micro functional needle was inserted into the right common femoral artery and removed over a 018 wire which was up sized to a [place]. A 5-French sheath was inserted. Cobra II catheter was advanced into the aorta. The contralateral left common iliac artery was selected. The internal iliac artery on the left was selected. Angiography was performed. A micro catheter was advanced over an 018 glide wire into the left uterine artery. Angiography was performed. Embolization was performed utilizing 1 vial 500 to 700 micron embospheres. The micro catheter was removed and flushed. A Waltman's loop was created. The ipsilateral right common iliac artery and right internal iliac artery were selected. Angiography was performed. A micro catheter was advanced over a 0018 glide wire into the right uterine artery. Angiography was performed. Embolization was again performed  with 2 vials 500-700 micron embospheres. The micro catheter and Cobra catheter were removed. Right femoral angiography was performed. A exoseal device was deployed without complication and hemostasis was achieved. FINDINGS: Imaging demonstrates bilateral pelvic anatomy and bilateral uterine artery angiography. Expected anatomy is identified. No blood supply from the uterine artery to the vagina is visualized. Post embolization angiography demonstrates relative stasis of flow and pruning of the vasculature to the uterus. IMPRESSION: Successful bilateral uterine artery embolization. Electronically Signed   By: Marybelle Killings M.D.   On: 02/12/2015 16:55    Labs:  CBC:  Recent Labs  12/25/14 0830 12/26/14 1150 12/27/14 0007 02/01/15 1341 02/12/15 0730  WBC 8.0 7.7  --  8.1 10.1  HGB 6.5 Repeated and verified X2.* 6.2* 9.9* 8.4* 7.0*  HCT 22.8 Repeated and verified X2.* 23.0* 29.0* 27.3* 24.0*  PLT 774.0* 679*  --  298.0 548*    COAGS:  Recent Labs  02/12/15 0730  INR 1.12    BMP:  Recent Labs  03/03/14 1033 12/27/14 0007 01/18/15 1027 02/01/15 1341 02/12/15 0730  NA 133* 137  --  133* 136  K 3.7 3.7  --  3.9 3.9  CL 97 104  --  98 102  CO2 28  --   --  23 18*  GLUCOSE 270* 214*  --  323* 396*  BUN 12 7 14 7 8   CALCIUM 10.2  --   --  10.3* 9.9  CREATININE 0.59 0.50 0.69 0.49* 0.74  GFRNONAA  --   --  >89 >89 >60  GFRAA  --   --  >89 >89 >60    LIVER FUNCTION TESTS:  Recent Labs  03/03/14 1033 02/01/15 1341  BILITOT 0.2 0.2  AST 31 13  ALT 41* 12  ALKPHOS 141* 86  PROT 8.3 7.7  ALBUMIN 4.2 4.0    TUMOR MARKERS: No results for input(s): AFPTM, CEA, CA199, CHROMGRNA in the last 8760 hours.  Assessment and Plan:  Ms. Lorman has done well after uterine artery embolization. She has no apparent complication. She is having some minimal spotting. She was also instructed that she may continue to have heavy menstrual bleeding for at least 2 months and this should  subside. We will contact her at the three-month interval by phone and see her in 6 months.     Electronically Signed: Reginae Wolfrey, ART A 03/01/2015, 10:05 AM   I spent a total of  face in 30 minutes of clinical follow-up, greater than 50% of which was counseling/coordinating care for uterine artery embolization

## 2015-05-14 ENCOUNTER — Telehealth: Payer: Self-pay | Admitting: Family Medicine

## 2015-05-14 NOTE — Telephone Encounter (Signed)
amaryl 2 mg q d--- #30--- we will need to check labs in 3 months

## 2015-05-14 NOTE — Telephone Encounter (Signed)
No samples available. Please advise    KP

## 2015-05-14 NOTE — Telephone Encounter (Signed)
Detailed message left advising we do not have any samples at this time.    KP

## 2015-05-14 NOTE — Telephone Encounter (Signed)
Please advise      KP 

## 2015-05-14 NOTE — Telephone Encounter (Signed)
Patient states she's doesn't have any insurance requesting if PCP can prescribe an alternate medication that may be cheaper.

## 2015-05-14 NOTE — Telephone Encounter (Signed)
Relation to [id number] Call back [phone]   Reason for call:  Patient would like to know if you have samples of Liraglutide (VICTOZA) 18 MG/3ML SOPN

## 2015-05-14 NOTE — Telephone Encounter (Signed)
If we have one she can have one

## 2015-05-15 MED ORDER — GLIMEPIRIDE 2 MG PO TABS: 2.0000 mg | ORAL_TABLET | Freq: Every day | ORAL | Status: DC

## 2015-05-15 NOTE — Telephone Encounter (Signed)
Spoke with patient and she verbalized understanding of taking the Amaryl 2 mg daily. Rx has been faxed.      KP

## 2015-05-15 NOTE — Addendum Note (Signed)
Addended by: Ewing Schlein on: 05/15/2015 06:29 PM   Modules accepted: Orders

## 2015-06-15 ENCOUNTER — Telehealth: Payer: Self-pay | Admitting: *Deleted

## 2015-06-15 MED ORDER — ALPRAZOLAM 0.25 MG PO TABS: 0.2500 mg | ORAL_TABLET | Freq: Three times a day (TID) | ORAL | Status: DC | PRN

## 2015-06-15 NOTE — Telephone Encounter (Signed)
Refill x1   2 refills 

## 2015-06-15 NOTE — Telephone Encounter (Addendum)
Rx approved,printed,signed and faxed to the pharmacy.//AB/CMA

## 2015-06-15 NOTE — Telephone Encounter (Signed)
Requesting Xanax 0.25mg -Take 1 tablet by mouth three times a day as needed. Last refill:02/20/15;#30,0 Last OV:02/01/15 UDS:No done Please advise.//AB/CMA

## 2015-07-19 ENCOUNTER — Other Ambulatory Visit: Payer: Self-pay

## 2015-07-19 DIAGNOSIS — IMO0002 Reserved for concepts with insufficient information to code with codable children: Secondary | ICD-10-CM

## 2015-07-19 DIAGNOSIS — E1151 Type 2 diabetes mellitus with diabetic peripheral angiopathy without gangrene: Secondary | ICD-10-CM

## 2015-07-19 DIAGNOSIS — E1165 Type 2 diabetes mellitus with hyperglycemia: Principal | ICD-10-CM

## 2015-07-19 MED ORDER — METFORMIN HCL ER 750 MG PO TB24: ORAL_TABLET | ORAL | Status: DC

## 2015-08-13 ENCOUNTER — Other Ambulatory Visit (HOSPITAL_COMMUNITY): Payer: Self-pay | Admitting: Interventional Radiology

## 2015-08-13 DIAGNOSIS — D259 Leiomyoma of uterus, unspecified: Secondary | ICD-10-CM

## 2015-08-14 ENCOUNTER — Telehealth: Payer: Self-pay | Admitting: Family Medicine

## 2015-08-14 ENCOUNTER — Other Ambulatory Visit: Payer: Self-pay | Admitting: Family Medicine

## 2015-08-14 DIAGNOSIS — IMO0002 Reserved for concepts with insufficient information to code with codable children: Secondary | ICD-10-CM

## 2015-08-14 DIAGNOSIS — E1151 Type 2 diabetes mellitus with diabetic peripheral angiopathy without gangrene: Secondary | ICD-10-CM

## 2015-08-14 DIAGNOSIS — E1165 Type 2 diabetes mellitus with hyperglycemia: Principal | ICD-10-CM

## 2015-08-14 NOTE — Telephone Encounter (Signed)
.  sw

## 2015-08-15 ENCOUNTER — Other Ambulatory Visit: Payer: Commercial Managed Care - PPO

## 2015-08-20 ENCOUNTER — Ambulatory Visit: Payer: Commercial Managed Care - PPO | Admitting: Family Medicine

## 2015-09-06 ENCOUNTER — Ambulatory Visit: Payer: Commercial Managed Care - PPO | Admitting: Family Medicine

## 2015-09-24 ENCOUNTER — Other Ambulatory Visit: Payer: Self-pay | Admitting: Family Medicine

## 2015-09-24 DIAGNOSIS — IMO0002 Reserved for concepts with insufficient information to code with codable children: Secondary | ICD-10-CM

## 2015-09-24 DIAGNOSIS — E1165 Type 2 diabetes mellitus with hyperglycemia: Principal | ICD-10-CM

## 2015-09-24 DIAGNOSIS — E1151 Type 2 diabetes mellitus with diabetic peripheral angiopathy without gangrene: Secondary | ICD-10-CM

## 2015-09-25 MED ORDER — METFORMIN HCL ER 750 MG PO TB24: 1500.0000 mg | ORAL_TABLET | Freq: Every day | ORAL | 0 refills | Status: DC

## 2015-09-25 MED ORDER — GLIMEPIRIDE 2 MG PO TABS: 2.0000 mg | ORAL_TABLET | Freq: Every day | ORAL | 0 refills | Status: DC

## 2015-09-25 MED ORDER — ALPRAZOLAM 0.25 MG PO TABS: 0.2500 mg | ORAL_TABLET | Freq: Three times a day (TID) | ORAL | 0 refills | Status: DC | PRN

## 2015-10-18 ENCOUNTER — Telehealth: Payer: Self-pay | Admitting: Family Medicine

## 2015-10-18 ENCOUNTER — Ambulatory Visit: Payer: Self-pay | Admitting: Family Medicine

## 2015-10-18 NOTE — Telephone Encounter (Signed)
Pt lvm at 8:06 stating that she will not be at her appt today. Called pt back to reschedule. No answer.

## 2015-10-18 NOTE — Telephone Encounter (Signed)
Next avail follow up.    KP

## 2015-10-18 NOTE — Telephone Encounter (Signed)
Per PCP do not charge pt for missed appt.

## 2015-10-18 NOTE — Telephone Encounter (Signed)
Error

## 2015-10-18 NOTE — Telephone Encounter (Signed)
Pt will be coming in on Monday to have labs. Pt would like to come in once provider is back in the office for follow up. However PCP schedule isn't showing a follow up soon. Is it okay to use a 15 minute acute slot or should I schedule pt out to providers next available f/u slot ?

## 2015-10-22 ENCOUNTER — Other Ambulatory Visit (INDEPENDENT_AMBULATORY_CARE_PROVIDER_SITE_OTHER): Payer: Medicaid Other

## 2015-10-22 DIAGNOSIS — E785 Hyperlipidemia, unspecified: Secondary | ICD-10-CM

## 2015-10-22 DIAGNOSIS — E1065 Type 1 diabetes mellitus with hyperglycemia: Secondary | ICD-10-CM

## 2015-10-22 DIAGNOSIS — E108 Type 1 diabetes mellitus with unspecified complications: Secondary | ICD-10-CM

## 2015-10-22 DIAGNOSIS — IMO0002 Reserved for concepts with insufficient information to code with codable children: Secondary | ICD-10-CM

## 2015-10-22 LAB — COMPREHENSIVE METABOLIC PANEL
ALBUMIN: 3.7 g/dL (ref 3.5–5.2)
ALK PHOS: 123 U/L — AB (ref 39–117)
ALT: 23 U/L (ref 0–35)
AST: 15 U/L (ref 0–37)
BUN: 9 mg/dL (ref 6–23)
CALCIUM: 9.8 mg/dL (ref 8.4–10.5)
CHLORIDE: 100 meq/L (ref 96–112)
CO2: 24 mEq/L (ref 19–32)
Creatinine, Ser: 0.67 mg/dL (ref 0.40–1.20)
GFR: 123.85 mL/min (ref >60.00)
Glucose, Bld: 252 mg/dL — ABNORMAL HIGH (ref 70–99)
POTASSIUM: 4 meq/L (ref 3.5–5.1)
SODIUM: 133 meq/L — AB (ref 135–145)
TOTAL PROTEIN: 7.7 g/dL (ref 6.0–8.3)
Total Bilirubin: 0.2 mg/dL (ref 0.2–1.2)

## 2015-10-22 LAB — LIPID PANEL
CHOLESTEROL: 170 mg/dL (ref 0–200)
HDL: 36.5 mg/dL — AB (ref >39.00)
LDL Cholesterol: 109 mg/dL — ABNORMAL HIGH (ref 0–99)
NONHDL: 133
Total CHOL/HDL Ratio: 5
Triglycerides: 120 mg/dL (ref 0.0–149.0)
VLDL: 24 mg/dL (ref 0.0–40.0)

## 2015-10-22 LAB — HEMOGLOBIN A1C: HEMOGLOBIN A1C: 8.8 % — AB (ref 4.6–6.5)

## 2015-10-23 LAB — PTH, INTACT AND CALCIUM
Calcium: 9.9 mg/dL (ref 8.6–10.2)
PTH: 36 pg/mL (ref 14–64)

## 2015-10-24 ENCOUNTER — Other Ambulatory Visit: Payer: Self-pay | Admitting: Family Medicine

## 2015-10-24 DIAGNOSIS — IMO0002 Reserved for concepts with insufficient information to code with codable children: Secondary | ICD-10-CM

## 2015-10-24 DIAGNOSIS — E1165 Type 2 diabetes mellitus with hyperglycemia: Principal | ICD-10-CM

## 2015-10-24 DIAGNOSIS — E1151 Type 2 diabetes mellitus with diabetic peripheral angiopathy without gangrene: Secondary | ICD-10-CM

## 2015-10-25 NOTE — Telephone Encounter (Signed)
Refill 1 month each

## 2015-10-26 ENCOUNTER — Other Ambulatory Visit: Payer: Self-pay

## 2015-10-26 MED ORDER — GLIMEPIRIDE 4 MG PO TABS: 4.0000 mg | ORAL_TABLET | Freq: Every day | ORAL | 0 refills | Status: DC

## 2015-10-31 ENCOUNTER — Other Ambulatory Visit: Payer: Self-pay | Admitting: Family Medicine

## 2015-11-01 MED ORDER — ALPRAZOLAM 0.25 MG PO TABS: 0.2500 mg | ORAL_TABLET | Freq: Three times a day (TID) | ORAL | 0 refills | Status: DC | PRN

## 2015-11-01 NOTE — Telephone Encounter (Signed)
Last seen 02/01/15 and filled 10/25/15 #30   Please advise     KP

## 2015-11-23 ENCOUNTER — Other Ambulatory Visit: Payer: Self-pay | Admitting: Family Medicine

## 2015-11-23 DIAGNOSIS — E1151 Type 2 diabetes mellitus with diabetic peripheral angiopathy without gangrene: Secondary | ICD-10-CM

## 2015-11-23 DIAGNOSIS — IMO0002 Reserved for concepts with insufficient information to code with codable children: Secondary | ICD-10-CM

## 2015-11-23 DIAGNOSIS — E1165 Type 2 diabetes mellitus with hyperglycemia: Principal | ICD-10-CM

## 2015-11-26 MED ORDER — METFORMIN HCL ER 750 MG PO TB24: 1500.0000 mg | ORAL_TABLET | Freq: Every day | ORAL | 1 refills | Status: DC

## 2015-11-26 NOTE — Telephone Encounter (Signed)
Pt was last seen 01/2015 I will refill Rx for Metformin patient should schedule yearly follow up.TL/CMA

## 2015-12-26 ENCOUNTER — Telehealth: Payer: Self-pay | Admitting: Family Medicine

## 2015-12-28 MED ORDER — ALPRAZOLAM 0.25 MG PO TABS: 0.2500 mg | ORAL_TABLET | Freq: Three times a day (TID) | ORAL | 0 refills | Status: DC | PRN

## 2015-12-28 NOTE — Telephone Encounter (Signed)
Pt is requesting refill on Alprazolam.  Last OV: 02/01/2015 Last Fill: 11/01/2015 #30 and 0RF UDS: None  Please advise.

## 2015-12-28 NOTE — Telephone Encounter (Signed)
Pt informed via MyChart that script must be picked up at front desk for UDS and [place].

## 2015-12-28 NOTE — Telephone Encounter (Signed)
Ok to refill Need uds-- and contract if not done

## 2015-12-28 NOTE — Telephone Encounter (Signed)
Rx printed, awaiting DO signature.  

## 2016-01-28 ENCOUNTER — Other Ambulatory Visit: Payer: Self-pay | Admitting: Family Medicine

## 2016-01-28 DIAGNOSIS — E1151 Type 2 diabetes mellitus with diabetic peripheral angiopathy without gangrene: Secondary | ICD-10-CM

## 2016-01-28 DIAGNOSIS — IMO0002 Reserved for concepts with insufficient information to code with codable children: Secondary | ICD-10-CM

## 2016-01-28 DIAGNOSIS — E1165 Type 2 diabetes mellitus with hyperglycemia: Principal | ICD-10-CM

## 2016-02-08 ENCOUNTER — Encounter: Payer: Self-pay | Admitting: Family Medicine

## 2016-02-08 ENCOUNTER — Ambulatory Visit (INDEPENDENT_AMBULATORY_CARE_PROVIDER_SITE_OTHER): Payer: Medicaid Other | Admitting: Family Medicine

## 2016-02-08 VITALS — BP 130/80 | HR 103 | Temp 98.7°F | Resp 16 | Ht 67.0 in | Wt 213.8 lb

## 2016-02-08 DIAGNOSIS — E1142 Type 2 diabetes mellitus with diabetic polyneuropathy: Secondary | ICD-10-CM | POA: Diagnosis not present

## 2016-02-08 DIAGNOSIS — IMO0002 Reserved for concepts with insufficient information to code with codable children: Secondary | ICD-10-CM

## 2016-02-08 DIAGNOSIS — E118 Type 2 diabetes mellitus with unspecified complications: Secondary | ICD-10-CM

## 2016-02-08 DIAGNOSIS — E785 Hyperlipidemia, unspecified: Secondary | ICD-10-CM

## 2016-02-08 DIAGNOSIS — E1165 Type 2 diabetes mellitus with hyperglycemia: Secondary | ICD-10-CM | POA: Diagnosis not present

## 2016-02-08 DIAGNOSIS — F411 Generalized anxiety disorder: Secondary | ICD-10-CM

## 2016-02-08 LAB — POCT URINALYSIS DIPSTICK
BILIRUBIN UA: NEGATIVE
Blood, UA: NEGATIVE
Ketones, UA: NEGATIVE
LEUKOCYTES UA: NEGATIVE
NITRITE UA: NEGATIVE
PH UA: 6
Spec Grav, UA: 1.025
Urobilinogen, UA: NEGATIVE

## 2016-02-08 LAB — COMPREHENSIVE METABOLIC PANEL
ALT: 21 U/L (ref 0–35)
AST: 15 U/L (ref 0–37)
Albumin: 4 g/dL (ref 3.5–5.2)
Alkaline Phosphatase: 110 U/L (ref 39–117)
BUN: 11 mg/dL (ref 6–23)
CO2: 26 meq/L (ref 19–32)
Calcium: 9.8 mg/dL (ref 8.4–10.5)
Chloride: 106 mEq/L (ref 96–112)
Creatinine, Ser: 0.64 mg/dL (ref 0.40–1.20)
GFR: 130.39 mL/min (ref >60.00)
GLUCOSE: 190 mg/dL — AB (ref 70–99)
POTASSIUM: 3.5 meq/L (ref 3.5–5.1)
Sodium: 139 mEq/L (ref 135–145)
Total Bilirubin: 0.2 mg/dL (ref 0.2–1.2)
Total Protein: 7.9 g/dL (ref 6.0–8.3)

## 2016-02-08 LAB — HEMOGLOBIN A1C: HEMOGLOBIN A1C: 7.4 % — AB (ref 4.6–6.5)

## 2016-02-08 LAB — LIPID PANEL
CHOLESTEROL: 186 mg/dL (ref 0–200)
HDL: 38.1 mg/dL — ABNORMAL LOW (ref >39.00)
LDL Cholesterol: 122 mg/dL — ABNORMAL HIGH (ref 0–99)
NonHDL: 148.14
TRIGLYCERIDES: 130 mg/dL (ref 0.0–149.0)
Total CHOL/HDL Ratio: 5
VLDL: 26 mg/dL (ref 0.0–40.0)

## 2016-02-08 LAB — MICROALBUMIN / CREATININE URINE RATIO
CREATININE, U: 211.4 mg/dL
MICROALB/CREAT RATIO: 1.2 mg/g (ref 0.0–30.0)
Microalb, Ur: 2.5 mg/dL — ABNORMAL HIGH (ref 0.0–1.9)

## 2016-02-08 MED ORDER — ALPRAZOLAM 0.25 MG PO TABS: 0.2500 mg | ORAL_TABLET | Freq: Three times a day (TID) | ORAL | 1 refills | Status: DC | PRN

## 2016-02-08 MED ORDER — DULOXETINE HCL 30 MG PO CPEP: ORAL_CAPSULE | ORAL | 0 refills | Status: DC

## 2016-02-08 MED ORDER — DULOXETINE HCL 60 MG PO CPEP: 60.0000 mg | ORAL_CAPSULE | Freq: Every day | ORAL | 3 refills | Status: DC

## 2016-02-08 NOTE — Patient Instructions (Signed)
Carbohydrate Counting for Diabetes Mellitus, Adult Carbohydrate counting is a method for keeping track of how many carbohydrates you eat. Eating carbohydrates naturally increases the amount of sugar (glucose) in the blood. Counting how many carbohydrates you eat helps keep your blood glucose within normal limits, which helps you manage your diabetes (diabetes mellitus). It is important to know how many carbohydrates you can safely have in each meal. This is different for every person. A diet and nutrition specialist (registered dietitian) can help you make a meal plan and calculate how many carbohydrates you should have at each meal and snack. Carbohydrates are found in the following foods:  Grains, such as breads and cereals.  Dried beans and soy products.  Starchy vegetables, such as potatoes, peas, and corn.  Fruit and fruit juices.  Milk and yogurt.  Sweets and snack foods, such as cake, cookies, candy, chips, and soft drinks. How do I count carbohydrates? There are two ways to count carbohydrates in food. You can use either of the methods or a combination of both. Reading "Nutrition Facts" on packaged food  The "Nutrition Facts" list is included on the labels of almost all packaged foods and beverages in the [place] It includes:  The serving size.  Information about nutrients in each serving, including the grams (g) of carbohydrate per serving. To use the "Nutrition Facts":  Decide how many servings you will have.  Multiply the number of servings by the number of carbohydrates per serving.  The resulting number is the total amount of carbohydrates that you will be having. Learning standard serving sizes of other foods  When you eat foods containing carbohydrates that are not packaged or do not include "Nutrition Facts" on the label, you need to measure the servings in order to count the amount of carbohydrates:  Measure the foods that you will eat with a food scale or measuring  cup, if needed.  Decide how many standard-size servings you will eat.  Multiply the number of servings by 15. Most carbohydrate-rich foods have about 15 g of carbohydrates per serving.  For example, if you eat 8 oz (170 g) of strawberries, you will have eaten 2 servings and 30 g of carbohydrates (2 servings x 15 g = 30 g).  For foods that have more than one food mixed, such as soups and casseroles, you must count the carbohydrates in each food that is included. The following list contains standard serving sizes of common carbohydrate-rich foods. Each of these servings has about 15 g of carbohydrates:   hamburger bun or  English muffin.   oz (15 mL) syrup.   oz (14 g) jelly.  1 slice of bread.  1 six-inch tortilla.  3 oz (85 g) cooked rice or pasta.  4 oz (113 g) cooked dried beans.  4 oz (113 g) starchy vegetable, such as peas, corn, or potatoes.  4 oz (113 g) hot cereal.  4 oz (113 g) mashed potatoes or  of a large baked potato.  4 oz (113 g) canned or frozen fruit.  4 oz (120 mL) fruit juice.  4-6 crackers.  6 chicken nuggets.  6 oz (170 g) unsweetened dry cereal.  6 oz (170 g) plain fat-free yogurt or yogurt sweetened with artificial sweeteners.  8 oz (240 mL) milk.  8 oz (170 g) fresh fruit or one small piece of fruit.  24 oz (680 g) popped popcorn. Example of carbohydrate counting Sample meal  3 oz (85 g) chicken breast.  6 oz (  170 g) brown rice.  4 oz (113 g) corn.  8 oz (240 mL) milk.  8 oz (170 g) strawberries with sugar-free whipped topping. Carbohydrate calculation 1. Identify the foods that contain carbohydrates:  Rice.  Corn.  Milk.  Strawberries. 2. Calculate how many servings you have of each food:  2 servings rice.  1 serving corn.  1 serving milk.  1 serving strawberries. 3. Multiply each number of servings by 15 g:  2 servings rice x 15 g = 30 g.  1 serving corn x 15 g = 15 g.  1 serving milk x 15 g = 15  g.  1 serving strawberries x 15 g = 15 g. 4. Add together all of the amounts to find the total grams of carbohydrates eaten:  30 g + 15 g + 15 g + 15 g = 75 g of carbohydrates total. This information is not intended to replace advice given to you by your health care provider. Make sure you discuss any questions you have with your health care provider. Document Released: 12/30/2004 Document Revised: 07/20/2015 Document Reviewed: 06/13/2015 Elsevier Interactive Patient Education  2017 Elsevier Inc.  

## 2016-02-08 NOTE — Progress Notes (Signed)
Subjective:    Patient ID: Cheryl Hart, female    DOB: 10-Jun-1975, 41 y.o.   MRN: [id number]  Chief Complaint  Patient presents with  . Follow-up    diabetes and anxiety    HPI Patient is in today for follow up diabetes and anxiety.  No complaints  HYPERTENSION   DIABETES    Blood Sugar ranges-120-170  Polyuria- no New Visual problems- no  Hypoglycemic symptoms- no  Other side effects-no Medication compliance - good Last eye exam- due Foot exam- today   HYPERLIPIDEMIA  Medication compliance- good RUQ pain- no  Muscle aches- no Other side effects --no   Past Medical History:  Diagnosis Date  . Anemia   . Anxiety   . Diabetes mellitus   . Hyperlipidemia   . Uterine fibroid     Past Surgical History:  Procedure Laterality Date  . CESAREAN SECTION    . CHOLECYSTECTOMY    . g1 p1    . LAPAROSCOPIC [place] ASSISTED MYOMECTOMY  02/12/2015   Dr Barbie Banner    Family History  Problem Relation Age of Onset  . Migraines    . Diabetes Mother   . Diabetes Father   . Diabetes Brother     Social History   Social History  . Marital status: Single    Spouse name: N/A  . Number of children: N/A  . Years of education: N/A   Occupational History  . psych tech [employer]   Social History Main Topics  . Smoking status: Never Smoker  . Smokeless tobacco: Never Used  . Alcohol use No  . Drug use: No  . Sexual activity: Yes    Partners: Male    Birth control/ protection: Pill   Other Topics Concern  . Not on file   Social History Narrative   Exercise-- no    Outpatient Medications Prior to Visit  Medication Sig Dispense Refill  . ACCU-CHEK FASTCLIX LANCETS MISC Use to test blood sugar 2 times daily as instruced. 102 each 4  . Biotin 5000 MCG TABS Take 1 tablet by mouth daily.    . diphenhydrAMINE (BENADRYL) 25 MG tablet Take 50 mg by mouth at bedtime as needed for sleep.     Marland Kitchen glimepiride (AMARYL) 4 MG tablet Take 1 tablet (4 mg total)  by mouth daily before breakfast. 90 tablet 0  . glucose blood (ACCU-CHEK SMARTVIEW) test strip Use to test blood sugar 2 times daily as instructed 100 each 4  . metFORMIN (GLUCOPHAGE-XR) 750 MG 24 hr tablet TAKE TWO TABLETS BY MOUTH  DAILY 60 tablet 1  . Multiple Vitamin (MULTIVITAMIN WITH MINERALS) TABS tablet Take 1 tablet by mouth daily.    Marland Kitchen ALPRAZolam (XANAX) 0.25 MG tablet Take 1 tablet (0.25 mg total) by mouth 3 (three) times daily as needed. 30 tablet 0  . atorvastatin (LIPITOR) 20 MG tablet Take 1 tablet (20 mg total) by mouth daily. (Patient not taking: Reported on 02/08/2016) 30 tablet 2  . docusate sodium (COLACE) 100 MG capsule Take 1 capsule (100 mg total) by mouth 2 (two) times daily as needed for mild constipation.  0  . DULoxetine (CYMBALTA) 60 MG capsule TAKE ONE CAPSULE BY MOUTH ONCE DAILY 90 capsule 1  . Fe Cbn-Fe Gluc-FA-B12-C-DSS (FERRALET 90) 90-1 MG TABS Take 90 mg by mouth daily. 90 each 3  . HYDROcodone-acetaminophen (NORCO) 5-325 MG tablet Take 1-2 tablets by mouth every 4 (four) hours as needed for moderate pain. 30 tablet 0  .  ibuprofen (ADVIL) 200 MG tablet Take 3 tablets (600 mg total) by mouth every 8 (eight) hours as needed. 30 tablet 0  . ibuprofen (ADVIL,MOTRIN) 200 MG tablet Take 800 mg by mouth every 6 (six) hours as needed (Pain).    . OGESTREL 0.5-50 MG-MCG tablet Take 1 tablet by mouth daily.  11  . ondansetron (ZOFRAN) 8 MG tablet Take 1 tablet (8 mg total) by mouth every 8 (eight) hours as needed for nausea or vomiting. 20 tablet 0   No facility-administered medications prior to visit.     Allergies  Allergen Reactions  . Sulfonamide Derivatives Photosensitivity, Rash and Other (See Comments)    Severe headache    Review of Systems  Constitutional: Negative for chills, fever and malaise/fatigue.  HENT: Negative for congestion and hearing loss.   Eyes: Negative for discharge.  Respiratory: Negative for cough, sputum production and shortness of  breath.   Cardiovascular: Negative for chest pain, palpitations and leg swelling.  Gastrointestinal: Negative for abdominal pain, blood in stool, constipation, diarrhea, heartburn, nausea and vomiting.  Genitourinary: Negative for dysuria, frequency, hematuria and urgency.  Musculoskeletal: Negative for back pain, falls and myalgias.  Skin: Negative for rash.  Neurological: Negative for dizziness, sensory change, loss of consciousness, weakness and headaches.  Endo/Heme/Allergies: Negative for environmental allergies. Does not bruise/bleed easily.  Psychiatric/Behavioral: Negative for depression and suicidal ideas. The patient is not nervous/anxious and does not have insomnia.        Objective:    Physical Exam  Constitutional: She is oriented to person, place, and time. She appears well-developed and well-nourished.  HENT:  Head: Normocephalic and atraumatic.  Eyes: Conjunctivae and EOM are normal.  Neck: Normal range of motion. Neck supple. No JVD present. Carotid bruit is not present. No thyromegaly present.  Cardiovascular: Normal rate, regular rhythm and normal heart sounds.   No murmur heard. Pulmonary/Chest: Effort normal and breath sounds normal. No respiratory distress. She has no wheezes. She has no rales. She exhibits no tenderness.  Musculoskeletal: She exhibits no edema.  Neurological: She is alert and oriented to person, place, and time.  Psychiatric: She has a normal mood and affect.  Nursing note and vitals reviewed. Sensory exam of the foot is normal, tested with the monofilament. Good pulses, no lesions or ulcers, good peripheral pulses.  BP 130/80 (BP Location: Right Arm, Cuff Size: Normal)   Pulse (!) 103   Temp 98.7 F (37.1 C) (Oral)   Resp 16   Ht 5\' 7"  (1.702 m)   Wt 213 lb 12.8 oz (97 kg)   LMP 01/16/2016   SpO2 98%   BMI 33.49 kg/m  Wt Readings from Last 3 Encounters:  02/08/16 213 lb 12.8 oz (97 kg)  02/01/15 197 lb 9.6 oz (89.6 kg)  01/18/15 200  lb (90.7 kg)     Lab Results  Component Value Date   WBC 10.1 02/12/2015   HGB 7.0 (L) 02/12/2015   HCT 24.0 (L) 02/12/2015   PLT 548 (H) 02/12/2015   GLUCOSE 190 (H) 02/08/2016   CHOL 186 02/08/2016   TRIG 130.0 02/08/2016   HDL 38.10 (L) 02/08/2016   LDLDIRECT 92.0 02/01/2015   LDLCALC 122 (H) 02/08/2016   ALT 21 02/08/2016   AST 15 02/08/2016   NA 139 02/08/2016   K 3.5 02/08/2016   CL 106 02/08/2016   CREATININE 0.64 02/08/2016   BUN 11 02/08/2016   CO2 26 02/08/2016   TSH 2.81 03/03/2014   INR 1.12 02/12/2015  HGBA1C 7.4 (H) 02/08/2016   MICROALBUR 2.5 (H) 02/08/2016    Lab Results  Component Value Date   TSH 2.81 03/03/2014   Lab Results  Component Value Date   WBC 10.1 02/12/2015   HGB 7.0 (L) 02/12/2015   HCT 24.0 (L) 02/12/2015   MCV 64.0 (L) 02/12/2015   PLT 548 (H) 02/12/2015   Lab Results  Component Value Date   NA 139 02/08/2016   K 3.5 02/08/2016   CO2 26 02/08/2016   GLUCOSE 190 (H) 02/08/2016   BUN 11 02/08/2016   CREATININE 0.64 02/08/2016   BILITOT 0.2 02/08/2016   ALKPHOS 110 02/08/2016   AST 15 02/08/2016   ALT 21 02/08/2016   PROT 7.9 02/08/2016   ALBUMIN 4.0 02/08/2016   CALCIUM 9.8 02/08/2016   ANIONGAP 16 (H) 02/12/2015   GFR 130.39 02/08/2016   Lab Results  Component Value Date   CHOL 186 02/08/2016   Lab Results  Component Value Date   HDL 38.10 (L) 02/08/2016   Lab Results  Component Value Date   LDLCALC 122 (H) 02/08/2016   Lab Results  Component Value Date   TRIG 130.0 02/08/2016   Lab Results  Component Value Date   CHOLHDL 5 02/08/2016   Lab Results  Component Value Date   HGBA1C 7.4 (H) 02/08/2016       Assessment & Plan:   Problem List Items Addressed This Visit      Unprioritized   Hyperlipidemia    Check labs      Relevant Orders   Lipid panel (Completed)   Comprehensive metabolic panel (Completed)   Uncontrolled type 2 diabetes mellitus with complication, without long-term current  use of insulin (HCC) - Primary    con't meds Check labs rto 6 months      Relevant Medications   DULoxetine (CYMBALTA) 60 MG capsule   Other Relevant Orders   Lipid panel (Completed)   Hemoglobin A1c (Completed)   POCT urinalysis dipstick (Completed)   Microalbumin / creatinine urine ratio (Completed)   Comprehensive metabolic panel (Completed)    Other Visit Diagnoses    Generalized anxiety disorder       Relevant Medications   ALPRAZolam (XANAX) 0.25 MG tablet   Diabetic polyneuropathy associated with type 2 diabetes mellitus (HCC)       Relevant Medications   ALPRAZolam (XANAX) 0.25 MG tablet   DULoxetine (CYMBALTA) 60 MG capsule      I have discontinued Ms. Schickling's FERRALET 90, DULoxetine, OGESTREL, ibuprofen, docusate sodium, ondansetron, ibuprofen, HYDROcodone-acetaminophen, and DULoxetine. I am also having her start on DULoxetine. Additionally, I am having her maintain her glucose blood, ACCU-CHEK FASTCLIX LANCETS, multivitamin with minerals, Biotin, diphenhydrAMINE, atorvastatin, glimepiride, metFORMIN, and ALPRAZolam.  Meds ordered this encounter  Medications  . ALPRAZolam (XANAX) 0.25 MG tablet    Sig: Take 1 tablet (0.25 mg total) by mouth 3 (three) times daily as needed.    Dispense:  30 tablet    Refill:  1  . DISCONTD: DULoxetine (CYMBALTA) 30 MG capsule    Sig: 1 po qd x 1 week then 2 po qd    Dispense:  60 capsule    Refill:  0  . DULoxetine (CYMBALTA) 60 MG capsule    Sig: Take 1 capsule (60 mg total) by mouth daily.    Dispense:  90 capsule    Refill:  3    CMA served as scribe during this visit. History, Physical and Plan performed by medical provider. Documentation and orders  reviewed and attested to.   Ann Held, DO

## 2016-02-08 NOTE — Progress Notes (Signed)
Pre visit review using our clinic review tool, if applicable. No additional management support is needed unless otherwise documented below in the visit note. 

## 2016-02-10 NOTE — Assessment & Plan Note (Signed)
con't meds Check labs rto 6 months

## 2016-02-10 NOTE — Assessment & Plan Note (Signed)
Check labs 

## 2016-02-14 ENCOUNTER — Other Ambulatory Visit: Payer: Self-pay | Admitting: Family Medicine

## 2016-02-14 DIAGNOSIS — E119 Type 2 diabetes mellitus without complications: Secondary | ICD-10-CM

## 2016-02-14 DIAGNOSIS — E785 Hyperlipidemia, unspecified: Secondary | ICD-10-CM

## 2016-02-14 MED ORDER — SITAGLIPTIN PHOSPHATE 100 MG PO TABS: 100.0000 mg | ORAL_TABLET | Freq: Every day | ORAL | 5 refills | Status: DC

## 2016-02-14 MED ORDER — ATORVASTATIN CALCIUM 40 MG PO TABS: 40.0000 mg | ORAL_TABLET | Freq: Every day | ORAL | 5 refills | Status: DC

## 2016-02-19 ENCOUNTER — Other Ambulatory Visit: Payer: Self-pay | Admitting: Family Medicine

## 2016-03-28 ENCOUNTER — Other Ambulatory Visit: Payer: Self-pay | Admitting: Family Medicine

## 2016-03-28 DIAGNOSIS — IMO0002 Reserved for concepts with insufficient information to code with codable children: Secondary | ICD-10-CM

## 2016-03-28 DIAGNOSIS — E1165 Type 2 diabetes mellitus with hyperglycemia: Principal | ICD-10-CM

## 2016-03-28 DIAGNOSIS — E1151 Type 2 diabetes mellitus with diabetic peripheral angiopathy without gangrene: Secondary | ICD-10-CM

## 2016-03-28 DIAGNOSIS — F411 Generalized anxiety disorder: Secondary | ICD-10-CM

## 2016-03-31 NOTE — Telephone Encounter (Signed)
Faxed hardcopy for Alprazolam to [facility] in [place] on [address].

## 2016-03-31 NOTE — Telephone Encounter (Signed)
Requesting:  alprazolam Contract    02/08/16 UDS   None Last OV    02/08/16 Last Refill   #30 with 1 refill 02/08/16  Please Advise

## 2016-05-14 ENCOUNTER — Other Ambulatory Visit: Payer: Medicaid Other

## 2016-05-19 ENCOUNTER — Other Ambulatory Visit: Payer: Self-pay | Admitting: Family Medicine

## 2016-06-18 ENCOUNTER — Other Ambulatory Visit: Payer: Self-pay | Admitting: Family Medicine

## 2016-06-18 DIAGNOSIS — F411 Generalized anxiety disorder: Secondary | ICD-10-CM

## 2016-06-19 NOTE — Telephone Encounter (Signed)
Requesting:   alprazolam Contract   None UDS   none Last OV    02/08/2016 Last Refill      #30 with 1 refills on 03/31/16  Please Advise

## 2016-06-19 NOTE — Telephone Encounter (Signed)
Faxed hardcopy for Alprazolam to [facility] on [address]

## 2016-08-26 ENCOUNTER — Other Ambulatory Visit: Payer: Self-pay | Admitting: Family Medicine

## 2016-10-01 ENCOUNTER — Telehealth: Payer: Self-pay | Admitting: Family Medicine

## 2016-10-01 DIAGNOSIS — E1165 Type 2 diabetes mellitus with hyperglycemia: Principal | ICD-10-CM

## 2016-10-01 DIAGNOSIS — IMO0002 Reserved for concepts with insufficient information to code with codable children: Secondary | ICD-10-CM

## 2016-10-01 DIAGNOSIS — E1151 Type 2 diabetes mellitus with diabetic peripheral angiopathy without gangrene: Secondary | ICD-10-CM

## 2016-10-01 MED ORDER — METFORMIN HCL ER 750 MG PO TB24: 1500.0000 mg | ORAL_TABLET | Freq: Every day | ORAL | 0 refills | Status: DC

## 2016-10-01 NOTE — Telephone Encounter (Signed)
Pt scheduled a medication F/U visit for Monday morning at 11:15, pt would like to know if provider could send her in a few pills of her medication to hold her until her apt on Monday morning?   Please advise.

## 2016-10-01 NOTE — Telephone Encounter (Signed)
Needs to schedule appt first/was advised to RTO for April appt/thx dmf

## 2016-10-01 NOTE — Telephone Encounter (Signed)
As pt is now scheduled for OV I have fax in 30d/pt is aware/thx dmf

## 2016-10-06 ENCOUNTER — Ambulatory Visit: Payer: Self-pay | Admitting: Family Medicine

## 2016-10-06 DIAGNOSIS — Z0289 Encounter for other administrative examinations: Secondary | ICD-10-CM

## 2016-10-29 ENCOUNTER — Other Ambulatory Visit: Payer: Self-pay | Admitting: Family Medicine

## 2016-10-29 DIAGNOSIS — E1165 Type 2 diabetes mellitus with hyperglycemia: Principal | ICD-10-CM

## 2016-10-29 DIAGNOSIS — IMO0002 Reserved for concepts with insufficient information to code with codable children: Secondary | ICD-10-CM

## 2016-10-29 DIAGNOSIS — E1151 Type 2 diabetes mellitus with diabetic peripheral angiopathy without gangrene: Secondary | ICD-10-CM

## 2016-12-14 IMAGING — XA IR ANGIO/ADD [PERSON_NAME]
8 series · 13 of 24 positions shown · IV contrast (IODINE)
Comparison: none

INDICATION: Menorrhagia.  Fibroids.

[Series 1: care single · 1 of 1 slices shown]
[im 1/1]
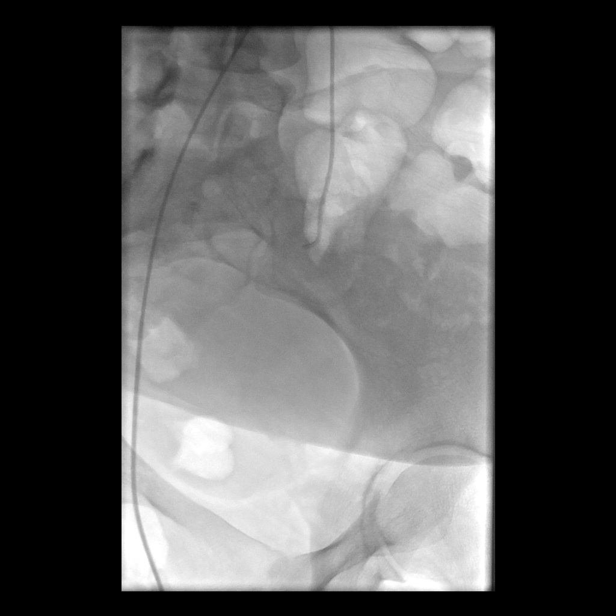

[Series 2: body 4 · 1 of 31 frames shown (1 of 7)]
[frame 16/31]
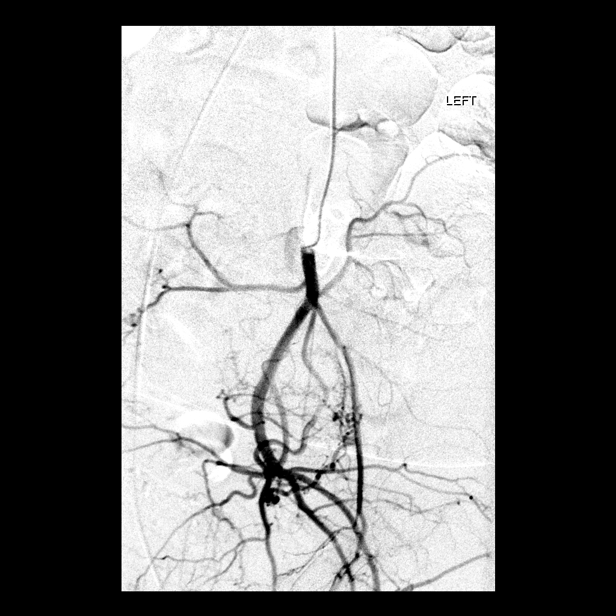

[Series 4: body 4 · 2 of 26 frames shown (2 of 7)]
[frame 4/26]
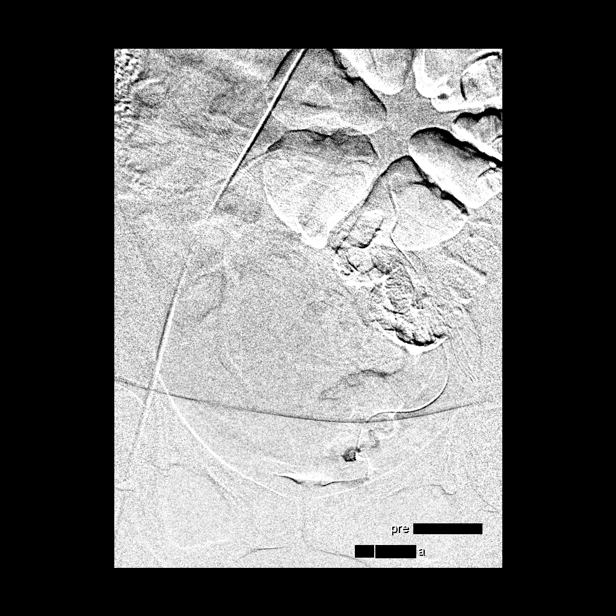
[frame 23/26]
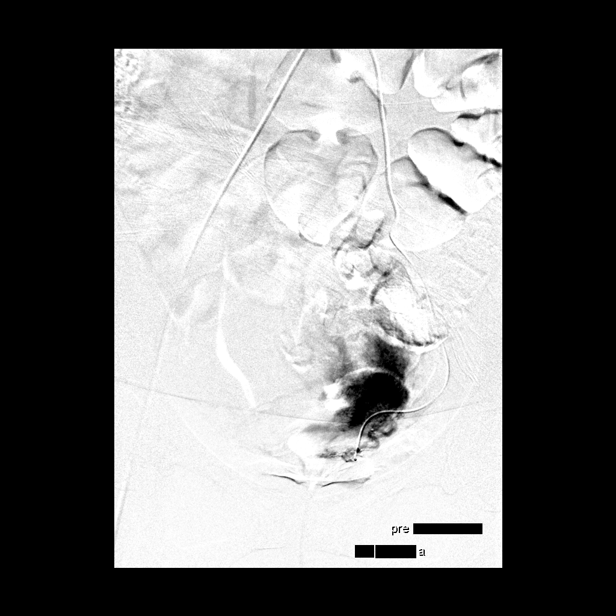

[Series 5: body 4 · 1 of 20 frames shown (3 of 7)]
[frame 6/20]
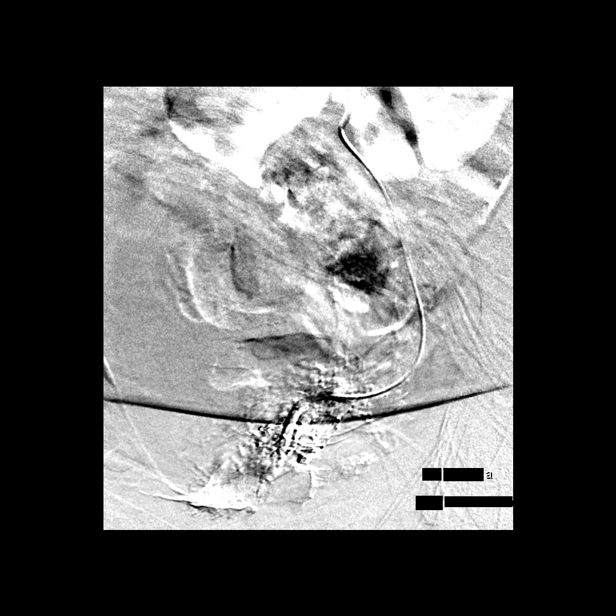

[Series 6: body 4 · 3 of 28 frames shown (4 of 7)]
[frame 5/28]
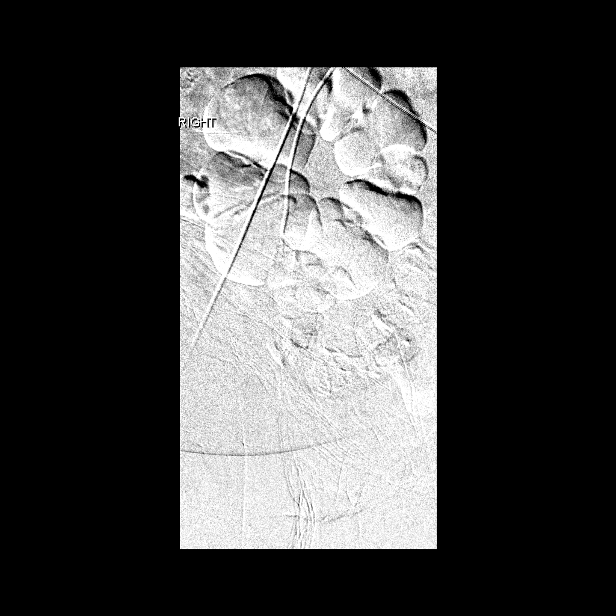
[frame 17/28]
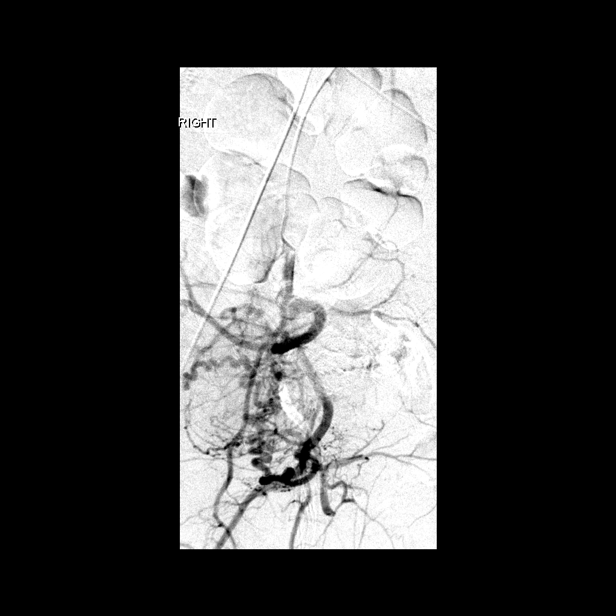
[frame 24/28]
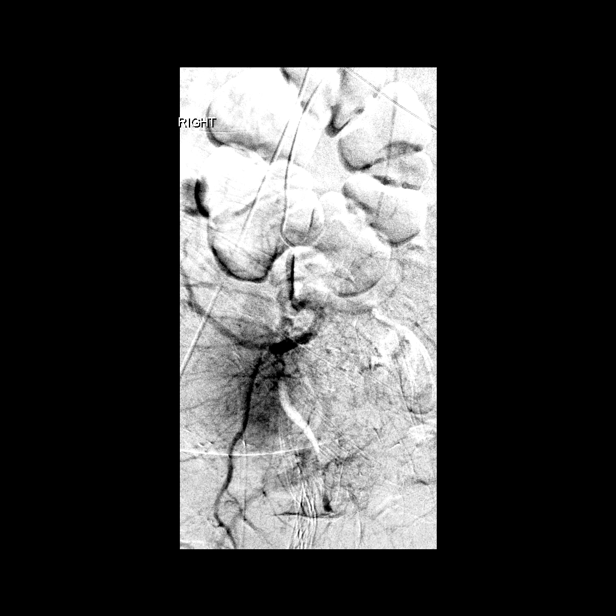

[Series 7: body 4 · 1 of 23 frames shown (5 of 7)]
[frame 14/23]
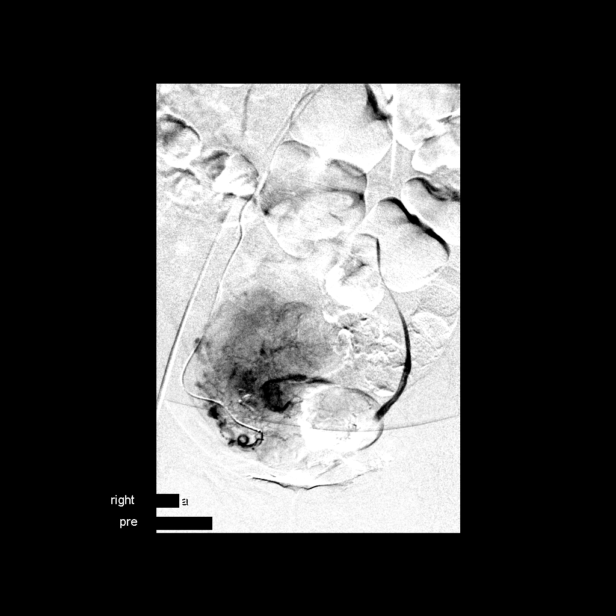

[Series 8: body 4 · 2 of 9 frames shown (6 of 7)]
[frame 1/9]
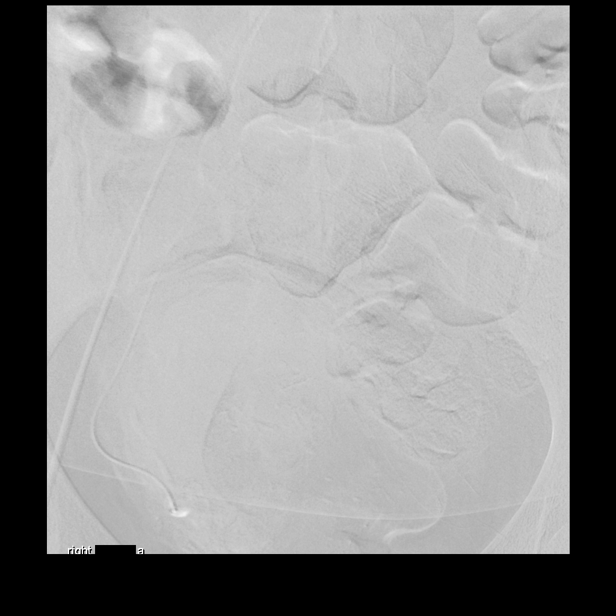
[frame 5/9]
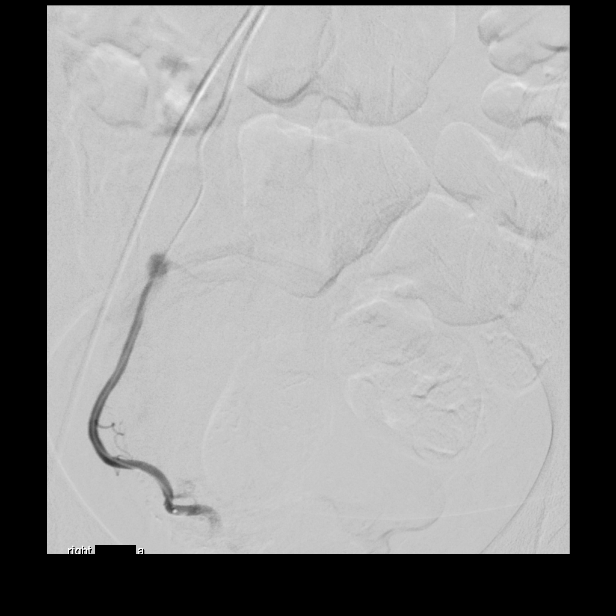

[Series 9: body 4 · 2 of 10 frames shown (7 of 7)]
[frame 6/10]
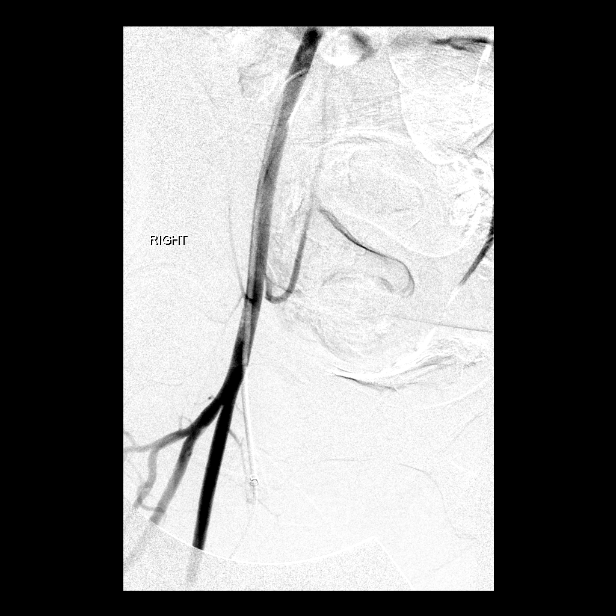
[frame 9/10]
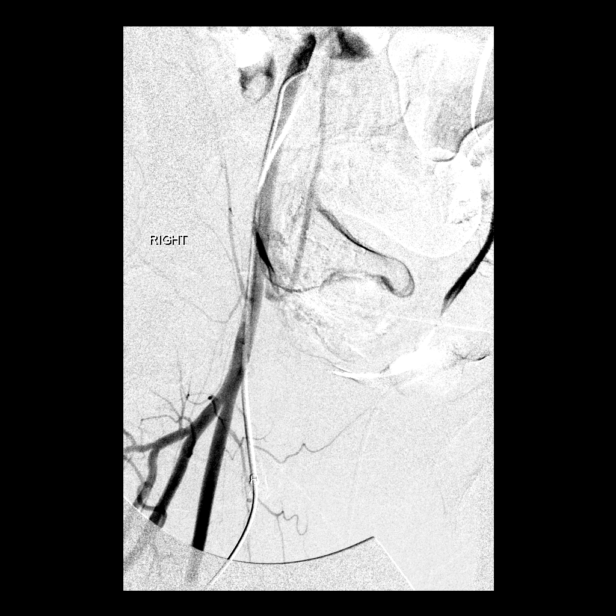

[13 of 24 positions shown; findings below may reference images not displayed]

EXAM:
UTERINE ARTERY EMBOLIZATION

MEDICATIONS:
As antibiotic prophylaxis, Ancef was ordered pre-procedure and
administered intravenously within one hour of incision.. The
antibiotic was administered within 1 hour of the procedure

ANESTHESIA/SEDATION:
Fentanyl 100 mcg IV; Versed Three mg IV

Moderate Sedation Time:  75 minutes

The patient was continuously monitored during the procedure by the
interventional radiology nurse under my direct supervision.

CONTRAST:  100 cc Omnipaque 300

FLUOROSCOPY TIME:  Fluoroscopy Time: 20 minutes 48 seconds (7707
mGy).

COMPLICATIONS:
None immediate.



Vessels selected:  For referring iliac artery bilateral.

The right groin was prepped with Betadine in a sterile fashion, and
a sterile drape was applied covering the operative field. A sterile
gown and sterile gloves were used for the procedure.

A micro functional needle was inserted into the right common femoral
artery and removed over a 018 wire which was up sized to Tawfique Loay. A
5-French sheath was inserted. Cobra II catheter was advanced into
the aorta. The contralateral left common iliac artery was selected.
The internal iliac artery on the left was selected. Angiography was
performed. A micro catheter was advanced over an 018 glide wire into
the left uterine artery. Angiography was performed.

Embolization was performed utilizing 1 vial 500 to 700 micron
embospheres.

The micro catheter was removed and flushed. Tadeo Vine loop was
created. The ipsilateral right common iliac artery and right
internal iliac artery were selected. Angiography was performed. A
micro catheter was advanced over a 6688 glide wire into the right
uterine artery. Angiography was performed.

Embolization was again performed with 2 vials 500-700 micron
embospheres.

The micro catheter and Cobra catheter were removed. Right femoral
angiography was performed. A exoseal device was deployed without
complication and hemostasis was achieved.
FINDINGS: Imaging demonstrates bilateral pelvic anatomy and bilateral uterine
artery angiography. Expected anatomy is identified. No blood supply
from the uterine artery to the vagina is visualized.

Post embolization angiography demonstrates relative stasis of flow
and pruning of the vasculature to the uterus.
IMPRESSION: Successful bilateral uterine artery embolization.

## 2016-12-14 IMAGING — US IR ANGIO/ADD [PERSON_NAME]
1 series · 1 of 1 positions shown · non-contrast
Comparison: none

INDICATION: Menorrhagia.  Fibroids.

[Series 1: ir (id) (id)/(id)/(id) · 1 of 1 slices shown]
[im 1/1]
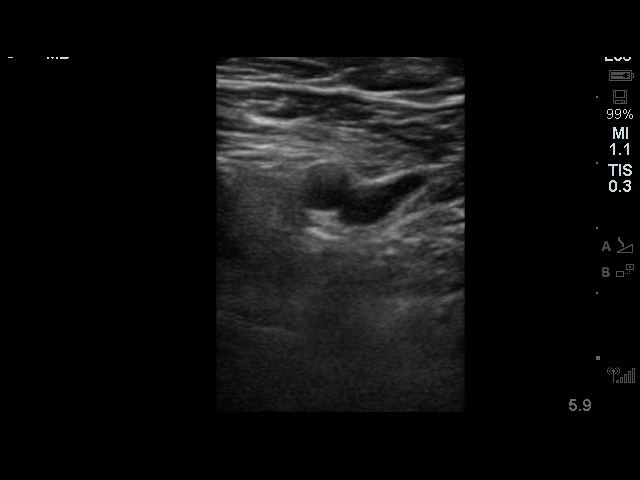

[1 of 1 positions shown; findings below may reference images not displayed]

EXAM:
UTERINE ARTERY EMBOLIZATION

MEDICATIONS:
As antibiotic prophylaxis, Ancef was ordered pre-procedure and
administered intravenously within one hour of incision.. The
antibiotic was administered within 1 hour of the procedure

ANESTHESIA/SEDATION:
Fentanyl 100 mcg IV; Versed Three mg IV

Moderate Sedation Time:  75 minutes

The patient was continuously monitored during the procedure by the
interventional radiology nurse under my direct supervision.

CONTRAST:  100 cc Omnipaque 300

FLUOROSCOPY TIME:  Fluoroscopy Time: 20 minutes 48 seconds (7707
mGy).

COMPLICATIONS:
None immediate.



Vessels selected:  For referring iliac artery bilateral.

The right groin was prepped with Betadine in a sterile fashion, and
a sterile drape was applied covering the operative field. A sterile
gown and sterile gloves were used for the procedure.

A micro functional needle was inserted into the right common femoral
artery and removed over a 018 wire which was up sized to Tawfique Loay. A
5-French sheath was inserted. Cobra II catheter was advanced into
the aorta. The contralateral left common iliac artery was selected.
The internal iliac artery on the left was selected. Angiography was
performed. A micro catheter was advanced over an 018 glide wire into
the left uterine artery. Angiography was performed.

Embolization was performed utilizing 1 vial 500 to 700 micron
embospheres.

The micro catheter was removed and flushed. Tadeo Vine loop was
created. The ipsilateral right common iliac artery and right
internal iliac artery were selected. Angiography was performed. A
micro catheter was advanced over a 6688 glide wire into the right
uterine artery. Angiography was performed.

Embolization was again performed with 2 vials 500-700 micron
embospheres.

The micro catheter and Cobra catheter were removed. Right femoral
angiography was performed. A exoseal device was deployed without
complication and hemostasis was achieved.
FINDINGS: Imaging demonstrates bilateral pelvic anatomy and bilateral uterine
artery angiography. Expected anatomy is identified. No blood supply
from the uterine artery to the vagina is visualized.

Post embolization angiography demonstrates relative stasis of flow
and pruning of the vasculature to the uterus.
IMPRESSION: Successful bilateral uterine artery embolization.

## 2016-12-19 ENCOUNTER — Telehealth: Payer: Self-pay | Admitting: Family Medicine

## 2016-12-19 NOTE — Telephone Encounter (Signed)
Pt did not pick up their Rx for XANAX dated 12/28/2015. Rx was shredded

## 2016-12-21 ENCOUNTER — Emergency Department (HOSPITAL_COMMUNITY)
Admission: EM | Admit: 2016-12-21 | Discharge: 2016-12-21 | Disposition: A | Discharge status: Home / Self Care | Payer: BLUE CROSS/BLUE SHIELD | Attending: Emergency Medicine | Admitting: Emergency Medicine

## 2016-12-21 ENCOUNTER — Other Ambulatory Visit: Payer: Self-pay

## 2016-12-21 ENCOUNTER — Encounter (HOSPITAL_COMMUNITY): Payer: Self-pay | Admitting: Emergency Medicine

## 2016-12-21 DIAGNOSIS — N3001 Acute cystitis with hematuria: Secondary | ICD-10-CM | POA: Diagnosis not present

## 2016-12-21 DIAGNOSIS — R1030 Lower abdominal pain, unspecified: Secondary | ICD-10-CM | POA: Diagnosis not present

## 2016-12-21 DIAGNOSIS — Z7984 Long term (current) use of oral hypoglycemic drugs: Secondary | ICD-10-CM | POA: Diagnosis not present

## 2016-12-21 DIAGNOSIS — Z79899 Other long term (current) drug therapy: Secondary | ICD-10-CM | POA: Diagnosis not present

## 2016-12-21 DIAGNOSIS — R109 Unspecified abdominal pain: Secondary | ICD-10-CM | POA: Diagnosis present

## 2016-12-21 DIAGNOSIS — E1165 Type 2 diabetes mellitus with hyperglycemia: Secondary | ICD-10-CM | POA: Insufficient documentation

## 2016-12-21 LAB — COMPREHENSIVE METABOLIC PANEL
ALT: 41 U/L (ref 14–54)
AST: 32 U/L (ref 15–41)
Albumin: 3.7 g/dL (ref 3.5–5.0)
Alkaline Phosphatase: 145 U/L — ABNORMAL HIGH (ref 38–126)
Anion gap: 11 (ref 5–15)
BILIRUBIN TOTAL: 0.2 mg/dL — AB (ref 0.3–1.2)
BUN: 5 mg/dL — AB (ref 6–20)
CHLORIDE: 100 mmol/L — AB (ref 101–111)
CO2: 22 mmol/L (ref 22–32)
CREATININE: 0.55 mg/dL (ref 0.44–1.00)
Calcium: 9.5 mg/dL (ref 8.9–10.3)
Glucose, Bld: 323 mg/dL — ABNORMAL HIGH (ref 65–99)
POTASSIUM: 3.5 mmol/L (ref 3.5–5.1)
Sodium: 133 mmol/L — ABNORMAL LOW (ref 135–145)
TOTAL PROTEIN: 8 g/dL (ref 6.5–8.1)

## 2016-12-21 LAB — WET PREP, GENITAL
SPERM: NONE SEEN
TRICH WET PREP: NONE SEEN
YEAST WET PREP: NONE SEEN

## 2016-12-21 LAB — URINALYSIS, ROUTINE W REFLEX MICROSCOPIC
Bacteria, UA: NONE SEEN
Bilirubin Urine: NEGATIVE
Glucose, UA: >=500 mg/dL — AB
KETONES UR: 20 mg/dL — AB
NITRITE: NEGATIVE
PROTEIN: 30 mg/dL — AB
Specific Gravity, Urine: 1.037 — ABNORMAL HIGH (ref 1.005–1.030)
pH: 5 (ref 5.0–8.0)

## 2016-12-21 LAB — CBC
HEMATOCRIT: 32.8 % — AB (ref 36.0–46.0)
Hemoglobin: 10.3 g/dL — ABNORMAL LOW (ref 12.0–15.0)
MCH: 22.2 pg — ABNORMAL LOW (ref 26.0–34.0)
MCHC: 31.4 g/dL (ref 30.0–36.0)
MCV: 70.7 fL — AB (ref 78.0–100.0)
PLATELETS: 393 10*3/uL (ref 150–400)
RBC: 4.64 MIL/uL (ref 3.87–5.11)
RDW: 17.1 % — AB (ref 11.5–15.5)
WBC: 8.5 10*3/uL (ref 4.0–10.5)

## 2016-12-21 LAB — I-STAT BETA HCG BLOOD, ED (MC, WL, AP ONLY)

## 2016-12-21 LAB — LIPASE, BLOOD: LIPASE: 45 U/L (ref 11–51)

## 2016-12-21 MED ORDER — CEPHALEXIN 250 MG PO CAPS
500.0000 mg | ORAL_CAPSULE | Freq: Once | ORAL | Status: AC
  Administered 2016-12-21: 500 mg via ORAL
  Filled 2016-12-21: qty 2

## 2016-12-21 MED ORDER — FLUCONAZOLE 150 MG PO TABS
150.0000 mg | ORAL_TABLET | Freq: Once | ORAL | Status: AC
  Administered 2016-12-21: 150 mg via ORAL
  Filled 2016-12-21: qty 1

## 2016-12-21 MED ORDER — CEPHALEXIN 500 MG PO CAPS: 500.0000 mg | ORAL_CAPSULE | Freq: Two times a day (BID) | ORAL | 0 refills | Status: DC

## 2016-12-21 MED ORDER — FLUCONAZOLE 150 MG PO TABS: 150.0000 mg | ORAL_TABLET | Freq: Every day | ORAL | 0 refills | Status: AC

## 2016-12-21 NOTE — ED Notes (Signed)
ED Provider at bedside. 

## 2016-12-21 NOTE — ED Provider Notes (Signed)
[place] EMERGENCY DEPARTMENT Provider Note   CSN: [id number] Arrival date & time: 12/21/16  1013     History   Chief Complaint Chief Complaint  Patient presents with  . Abdominal Pain  . Vaginal Bleeding    HPI Cheryl Hart is a 41 y.o. female who presents with abdominal pain. PMH significant for non-insulin dependent DM, HLD, anxiety, obesity. Past surgical hx significant for uterine artery embolization, cholecystectomy, C-section. She states that at about 4AM this morning she started to have vaginal bleeding. She has gone through three light pads and states the bleeding is getting somewhat heavier. No clots. She had the uterine artery embolization 2 years ago and has not had any abnormal vaginal bleeding until now. She has regular periods - LMP was 11/21. She reports associated lower abdominal cramping and pressure. She also has pressure when she urinates. She denies fever, chills, chest pain, SOB, weakness, fatigue, lightheadedness, N/V/D. She has an OBGYN in [place] but has not seen them in several years because she's been doing well. She sees her PCP for PAP smears and they have been normal.  HPI  Past Medical History:  Diagnosis Date  . Anemia   . Anxiety   . Diabetes mellitus   . Hyperlipidemia   . Uterine fibroid     Patient Active Problem List   Diagnosis Date Noted  . Fibroids   . Fibroid, uterine   . Obesity (BMI 30-39.9) 02/21/2013  . Hyperlipidemia 03/13/2011  . MORBID OBESITY 08/08/2008  . Uncontrolled type 2 diabetes mellitus with complication, without long-term current use of insulin ([place]) 03/31/2008  . UNSPECIFIED ANEMIA 03/31/2008  . GLYCOSURIA 03/29/2008  . LIVER FUNCTION TESTS, ABNORMAL, HX OF 02/28/2007  . ABDOMINAL PAIN 02/17/2007  . ANXIETY 11/18/2006  . GESTATIONAL DIABETES 11/18/2006  . FATIGUE 11/18/2006  . HEADACHE 11/18/2006  . ABDOMINAL PAIN, EPIGASTRIC 11/18/2006    Past Surgical History:  Procedure Laterality  Date  . CESAREAN SECTION    . CHOLECYSTECTOMY    . g1 p1    . LAPAROSCOPIC GELPORT ASSISTED MYOMECTOMY  02/12/2015   Dr Barbie Banner    OB History    No data available       Home Medications    Prior to Admission medications   Medication Sig Start Date End Date Taking? Authorizing Provider  ACCU-CHEK FASTCLIX LANCETS MISC Use to test blood sugar 2 times daily as instruced. 03/20/14   Philemon Kingdom, MD  ALPRAZolam Duanne Moron) 0.25 MG tablet TAKE 1 TABLET BY MOUTH THREE TIMES DAILY AS NEEDED 06/19/16   Carollee Herter, Alferd Apa, DO  atorvastatin (LIPITOR) 40 MG tablet Take 1 tablet (40 mg total) by mouth daily. 02/14/16   Ann Held, DO  Biotin 5000 MCG TABS Take 1 tablet by mouth daily.    [provider]  diphenhydrAMINE (BENADRYL) 25 MG tablet Take 50 mg by mouth at bedtime as needed for sleep.     [provider]  DULoxetine (CYMBALTA) 60 MG capsule Take 1 capsule (60 mg total) by mouth daily. 02/08/16   Roma Schanz R, DO  glimepiride (AMARYL) 4 MG tablet TAKE 1 TABLET BY MOUTH ONCE DAILY BEFORE BREAKFAST **NEW DOSAGE, DISCONTINUR GLIMEPIRIDE 2 MG TABLET** 08/26/16   Carollee Herter, Kendrick Fries R, DO  glucose blood (ACCU-CHEK SMARTVIEW) test strip Use to test blood sugar 2 times daily as instructed 03/20/14   Philemon Kingdom, MD  metFORMIN (GLUCOPHAGE-XR) 750 MG 24 hr tablet Take 2 tablets (1,500 mg total) by  mouth daily. 10/01/16   Roma Schanz R, DO  metFORMIN (GLUCOPHAGE-XR) 750 MG 24 hr tablet TAKE 2 TABLETS BY MOUTH ONCE DAILY 10/30/16   Ann Held, DO  Multiple Vitamin (MULTIVITAMIN WITH MINERALS) TABS tablet Take 1 tablet by mouth daily.    [provider]  sitaGLIPtin (JANUVIA) 100 MG tablet Take 1 tablet (100 mg total) by mouth daily. 02/14/16   Ann Held, DO    Family History Family History  Problem Relation Age of Onset  . Migraines Unknown   . Diabetes Mother   . Diabetes Father   . Diabetes Brother     Social  History Social History   Tobacco Use  . Smoking status: Never Smoker  . Smokeless tobacco: Never Used  Substance Use Topics  . Alcohol use: No  . Drug use: No     Allergies   Sulfonamide derivatives   Review of Systems Review of Systems  Constitutional: Negative for chills, fatigue and fever.  Respiratory: Negative for shortness of breath.   Cardiovascular: Negative for chest pain.  Gastrointestinal: Negative for abdominal pain, diarrhea, nausea and vomiting.  Genitourinary: Positive for pelvic pain and vaginal bleeding. Negative for dysuria, frequency, menstrual problem and vaginal discharge.  All other systems reviewed and are negative.    Physical Exam Updated Vital Signs BP (!) 146/95 (BP Location: Right Arm)   Pulse (!) 107   Temp 98.9 F (37.2 C) (Oral)   Resp 16   Ht 5\' 7"  (1.702 m)   Wt 93 kg (205 lb)   LMP 12/03/2016 (Exact Date)   SpO2 96%   BMI 32.11 kg/m   Physical Exam  Constitutional: She is oriented to person, place, and time. She appears well-developed and well-nourished. No distress.  HENT:  Head: Normocephalic and atraumatic.  Eyes: Conjunctivae are normal. Pupils are equal, round, and reactive to light. Right eye exhibits no discharge. Left eye exhibits no discharge. No scleral icterus.  Neck: Normal range of motion.  Cardiovascular: Tachycardia present. Exam reveals no gallop and no friction rub.  No murmur heard. Pulmonary/Chest: Effort normal and breath sounds normal. No stridor. No respiratory distress. She has no wheezes. She has no rales. She exhibits no tenderness.  Abdominal: Soft. Bowel sounds are normal. She exhibits no distension and no mass. There is tenderness (Diffuse lower abdominal pain). There is no rebound and no guarding. No hernia.  Genitourinary:  Genitourinary Comments: Pelvic: No inguinal lymphadenopathy or inguinal hernia noted. Normal external genitalia. No pain with speculum insertion. Closed cervical os with normal  appearance - no rash or lesions. No significant discharge or bleeding noted from cervix or in vaginal vault. On bimanual examination no adnexal tenderness or cervical motion tenderness. Chaperone (Chrisalyn, RN) present during exam.    Neurological: She is alert and oriented to person, place, and time.  Skin: Skin is warm and dry.  Psychiatric: She has a normal mood and affect. Her behavior is normal.  Nursing note and vitals reviewed.    ED Treatments / Results  Labs (all labs ordered are listed, but only abnormal results are displayed) Labs Reviewed  WET PREP, GENITAL - Abnormal; Notable for the following components:      Result Value   Clue Cells Wet Prep HPF POC PRESENT (*)    WBC, Wet Prep HPF POC MODERATE (*)    All other components within normal limits  COMPREHENSIVE METABOLIC PANEL - Abnormal; Notable for the following components:   Sodium 133 (*)  Chloride 100 (*)    Glucose, Bld 323 (*)    BUN 5 (*)    Alkaline Phosphatase 145 (*)    Total Bilirubin 0.2 (*)    All other components within normal limits  CBC - Abnormal; Notable for the following components:   Hemoglobin 10.3 (*)    HCT 32.8 (*)    MCV 70.7 (*)    MCH 22.2 (*)    RDW 17.1 (*)    All other components within normal limits  URINALYSIS, ROUTINE W REFLEX MICROSCOPIC - Abnormal; Notable for the following components:   APPearance HAZY (*)    Specific Gravity, Urine 1.037 (*)    Glucose, UA >=500 (*)    Hgb urine dipstick LARGE (*)    Ketones, ur 20 (*)    Protein, ur 30 (*)    Leukocytes, UA SMALL (*)    Squamous Epithelial / LPF 0-5 (*)    All other components within normal limits  URINE CULTURE  LIPASE, BLOOD  I-STAT BETA HCG BLOOD, ED ([facility], [facility], [facility] ONLY)  GC/CHLAMYDIA PROBE AMP ([place]) NOT AT [facility]    EKG  EKG Interpretation None       Radiology No results found.  Procedures Procedures (including critical care time)  Medications Ordered in ED Medications  fluconazole (DIFLUCAN)  tablet 150 mg (150 mg Oral Given 12/21/16 1343)  cephALEXin (KEFLEX) capsule 500 mg (500 mg Oral Given 12/21/16 1342)     Initial Impression / Assessment and Plan / ED Course  I have reviewed the triage vital signs and the nursing notes.  Pertinent labs & imaging results that were available during my care of the patient were reviewed by me and considered in my medical decision making (see chart for details).  41 year old female presents with acute cystitis. She is tachycardic and hypertensive. On review of EMR she is chronically tachycardic. She has suprapubic tenderness on exam. She has no vaginal bleeding on exam. CBC is remarkable for hgb of 10.3 which is improved from prior values. CMP is remarkable for mild hyponatremia and mild hypochloremia and hyperglycemia. She is likely mildly dehydrated due to her elevated blood sugars. UA shows >500 glucose, large hgb, 20 ketones, small leukocytes, 30 protein, specific gravity is 1.037, TNTC RBC, and TNTC WBC. She has no bacteria and does have yeast which is likely a contaminant. Urine culture was sent. She will be treated with Keflex and Diflucan. She was encouraged to orally hydrate. She has no fever, chills, leukocytosis, N/V. Return precautions were given.  Final Clinical Impressions(s) / ED Diagnoses   Final diagnoses:  Acute cystitis with hematuria  Type 2 diabetes mellitus with hyperglycemia, without long-term current use of insulin [facility])    ED Discharge Orders    None       Recardo Evangelist, PA-C 12/21/16 1405    Marcha Dutton Forbes Cellar, MD 12/21/16 1431

## 2016-12-21 NOTE — ED Triage Notes (Signed)
Pt arrives via [facility] reporting lower abd pain with some vaginal bleeding starting this am, LMP 12/03/16. Pt denies clots, reports using 3 lightday pads since 5am.  Pt denies n/v. NAD noted at this time, resp e/u.

## 2016-12-21 NOTE — Discharge Instructions (Signed)
Please take Keflex twice a day for 5 days Take Diflucan in 72 hours if your symptoms are not improving Follow up with your doctor Return if worsening

## 2016-12-23 LAB — URINE CULTURE

## 2016-12-23 LAB — GC/CHLAMYDIA PROBE AMP (~~LOC~~) NOT AT ARMC
CHLAMYDIA, DNA PROBE: NEGATIVE
NEISSERIA GONORRHEA: NEGATIVE

## 2016-12-24 ENCOUNTER — Telehealth: Payer: Self-pay | Admitting: Emergency Medicine

## 2016-12-24 NOTE — Telephone Encounter (Signed)
Post ED Visit - Positive Culture Follow-up  Culture report reviewed by antimicrobial stewardship pharmacist:  []  Elenor Quinones, Pharm.D. []  Heide Guile, Pharm.D., BCPS AQ-ID [x]  Parks Neptune, Pharm.D., BCPS []  Alycia Rossetti, Pharm.D., BCPS []  [place], Pharm.D., BCPS, AAHIVP []  Legrand Como, Pharm.D., BCPS, AAHIVP []  Salome Arnt, PharmD, BCPS []  Dimitri Ped, PharmD, BCPS []  Vincenza Hews, PharmD, BCPS  Positive urine culture Treated with cephalexin and fluconazole, organism sensitive to the same and no further patient follow-up is required at this time.  Hazle Nordmann 12/24/2016, 3:43 PM

## 2016-12-25 ENCOUNTER — Telehealth: Payer: Self-pay | Admitting: *Deleted

## 2016-12-25 NOTE — Telephone Encounter (Signed)
Post ED Visit - Positive Culture Follow-up  Culture report reviewed by antimicrobial stewardship pharmacist:  []  Cheryl Hart, Pharm.D. []  Cheryl Hart, Pharm.D., BCPS AQ-ID []  Cheryl Hart, Pharm.D., BCPS []  Cheryl Hart, Pharm.D., BCPS []  Cheryl Hart, [place].D., BCPS, AAHIVP []  Cheryl Hart, Pharm.D., BCPS, AAHIVP []  Cheryl Hart, PharmD, BCPS []  Cheryl Hart, PharmD, BCPS [x]  Cheryl Hart, PharmD, BCPS  Positive urine culture Treated with Cephalexin, organism sensitive to the same and no further patient follow-up is required at this time.  [name] [facility] 12/25/2016, 11:05 AM

## 2016-12-29 ENCOUNTER — Other Ambulatory Visit: Payer: Self-pay | Admitting: Family Medicine

## 2017-02-04 DIAGNOSIS — E119 Type 2 diabetes mellitus without complications: Secondary | ICD-10-CM | POA: Diagnosis not present

## 2017-02-04 DIAGNOSIS — H40033 Anatomical narrow angle, bilateral: Secondary | ICD-10-CM | POA: Diagnosis not present

## 2017-02-05 DIAGNOSIS — H5213 Myopia, bilateral: Secondary | ICD-10-CM | POA: Diagnosis not present

## 2017-02-09 ENCOUNTER — Other Ambulatory Visit (HOSPITAL_COMMUNITY)
Admission: RE | Admit: 2017-02-09 | Discharge: 2017-02-09 | Disposition: A | Discharge status: Home / Self Care | Payer: BLUE CROSS/BLUE SHIELD | Source: Ambulatory Visit | Attending: Family Medicine | Admitting: Family Medicine

## 2017-02-09 ENCOUNTER — Encounter: Payer: Self-pay | Admitting: Family Medicine

## 2017-02-09 ENCOUNTER — Ambulatory Visit (INDEPENDENT_AMBULATORY_CARE_PROVIDER_SITE_OTHER): Payer: BLUE CROSS/BLUE SHIELD | Admitting: Family Medicine

## 2017-02-09 VITALS — BP 106/80 | HR 119 | Temp 98.0°F | Resp 16 | Ht 68.0 in | Wt 203.0 lb

## 2017-02-09 DIAGNOSIS — E1151 Type 2 diabetes mellitus with diabetic peripheral angiopathy without gangrene: Secondary | ICD-10-CM

## 2017-02-09 DIAGNOSIS — Z79899 Other long term (current) drug therapy: Secondary | ICD-10-CM

## 2017-02-09 DIAGNOSIS — B373 Candidiasis of vulva and vagina: Secondary | ICD-10-CM

## 2017-02-09 DIAGNOSIS — E1165 Type 2 diabetes mellitus with hyperglycemia: Secondary | ICD-10-CM

## 2017-02-09 DIAGNOSIS — F411 Generalized anxiety disorder: Secondary | ICD-10-CM | POA: Diagnosis not present

## 2017-02-09 DIAGNOSIS — B3731 Acute candidiasis of vulva and vagina: Secondary | ICD-10-CM

## 2017-02-09 DIAGNOSIS — Z124 Encounter for screening for malignant neoplasm of cervix: Secondary | ICD-10-CM

## 2017-02-09 DIAGNOSIS — IMO0002 Reserved for concepts with insufficient information to code with codable children: Secondary | ICD-10-CM

## 2017-02-09 DIAGNOSIS — Z0000 Encounter for general adult medical examination without abnormal findings: Secondary | ICD-10-CM

## 2017-02-09 DIAGNOSIS — B354 Tinea corporis: Secondary | ICD-10-CM

## 2017-02-09 DIAGNOSIS — E785 Hyperlipidemia, unspecified: Secondary | ICD-10-CM | POA: Diagnosis not present

## 2017-02-09 LAB — POC URINALSYSI DIPSTICK (AUTOMATED)
Bilirubin, UA: NEGATIVE
Blood, UA: NEGATIVE
Leukocytes, UA: NEGATIVE
Nitrite, UA: NEGATIVE
Protein, UA: NEGATIVE
Urobilinogen, UA: 0.2 E.U./dL
pH, UA: 6 (ref 5.0–8.0)

## 2017-02-09 LAB — GLUCOSE, POCT (MANUAL RESULT ENTRY): POC Glucose: 293 mg/dl — AB (ref 70–99)

## 2017-02-09 MED ORDER — NYSTATIN 100000 UNIT/GM EX POWD: Freq: Four times a day (QID) | CUTANEOUS | 0 refills | Status: DC

## 2017-02-09 MED ORDER — SITAGLIPTIN PHOSPHATE 100 MG PO TABS: 100.0000 mg | ORAL_TABLET | Freq: Every day | ORAL | 5 refills | Status: DC

## 2017-02-09 MED ORDER — BLOOD GLUCOSE MONITOR KIT: PACK | 0 refills | Status: DC

## 2017-02-09 MED ORDER — ALPRAZOLAM 0.25 MG PO TABS: 0.2500 mg | ORAL_TABLET | Freq: Three times a day (TID) | ORAL | 1 refills | Status: DC | PRN

## 2017-02-09 MED ORDER — FLUCONAZOLE 150 MG PO TABS: 150.0000 mg | ORAL_TABLET | Freq: Once | ORAL | 0 refills | Status: DC

## 2017-02-09 NOTE — Patient Instructions (Signed)
Preventive Care 40-64 Years, Female Preventive care refers to lifestyle choices and visits with your health care provider that can promote health and wellness. What does preventive care include?  A yearly physical exam. This is also called an annual well check.  Dental exams once or twice a year.  Routine eye exams. Ask your health care provider how often you should have your eyes checked.  Personal lifestyle choices, including: ? Daily care of your teeth and gums. ? Regular physical activity. ? Eating a healthy diet. ? Avoiding tobacco and drug use. ? Limiting alcohol use. ? Practicing safe sex. ? Taking low-dose aspirin daily starting at age 44. ? Taking vitamin and mineral supplements as recommended by your health care provider. What happens during an annual well check? The services and screenings done by your health care provider during your annual well check will depend on your age, overall health, lifestyle risk factors, and family history of disease. Counseling Your health care provider may ask you questions about your:  Alcohol use.  Tobacco use.  Drug use.  Emotional well-being.  Home and relationship well-being.  Sexual activity.  Eating habits.  Work and work Statistician.  Method of birth control.  Menstrual cycle.  Pregnancy history.  Screening You may have the following tests or measurements:  Height, weight, and BMI.  Blood pressure.  Lipid and cholesterol levels. These may be checked every 5 years, or more frequently if you are over 44 years old.  Skin check.  Lung cancer screening. You may have this screening every year starting at age 44 if you have a 30-pack-year history of smoking and currently smoke or have quit within the past 15 years.  Fecal occult blood test (FOBT) of the stool. You may have this test every year starting at age 44.  Flexible sigmoidoscopy or colonoscopy. You may have a sigmoidoscopy every 5 years or a colonoscopy  every 10 years starting at age 44.  Hepatitis C blood test.  Hepatitis B blood test.  Sexually transmitted disease (STD) testing.  Diabetes screening. This is done by checking your blood sugar (glucose) after you have not eaten for a while (fasting). You may have this done every 1-3 years.  Mammogram. This may be done every 1-2 years. Talk to your health care provider about when you should start having regular mammograms. This may depend on whether you have a family history of breast cancer.  BRCA-related cancer screening. This may be done if you have a family history of breast, ovarian, tubal, or peritoneal cancers.  Pelvic exam and Pap test. This may be done every 3 years starting at age 80. Starting at age 44, this may be done every 5 years if you have a Pap test in combination with an HPV test.  Bone density scan. This is done to screen for osteoporosis. You may have this scan if you are at high risk for osteoporosis.  Discuss your test results, treatment options, and if necessary, the need for more tests with your health care provider. Vaccines Your health care provider may recommend certain vaccines, such as:  Influenza vaccine. This is recommended every year.  Tetanus, diphtheria, and acellular pertussis (Tdap, Td) vaccine. You may need a Td booster every 10 years.  Varicella vaccine. You may need this if you have not been vaccinated.  Zoster vaccine. You may need this after age 5.  Measles, mumps, and rubella (MMR) vaccine. You may need at least one dose of MMR if you were born in  1959 or later. You may also need a second dose.  Pneumococcal 13-valent conjugate (PCV13) vaccine. You may need this if you have certain conditions and were not previously vaccinated.  Pneumococcal polysaccharide (PPSV23) vaccine. You may need one or two doses if you smoke cigarettes or if you have certain conditions.  Meningococcal vaccine. You may need this if you have certain  conditions.  Hepatitis A vaccine. You may need this if you have certain conditions or if you travel or work in places where you may be exposed to hepatitis A.  Hepatitis B vaccine. You may need this if you have certain conditions or if you travel or work in places where you may be exposed to hepatitis B.  Haemophilus influenzae type b (Hib) vaccine. You may need this if you have certain conditions.  Talk to your health care provider about which screenings and vaccines you need and how often you need them. This information is not intended to replace advice given to you by your health care provider. Make sure you discuss any questions you have with your health care provider. Document Released: 01/26/2015 Document Revised: 09/19/2015 Document Reviewed: 10/31/2014 Elsevier Interactive Patient Education  2018 Elsevier Inc.  

## 2017-02-09 NOTE — Progress Notes (Signed)
Subjective:     Cheryl Hart is a 42 y.o. female and is here for a comprehensive physical exam. The patient reports no problems.  She has not been checking blood sugars because she needs a new machine.  She now has ins so she can get the [place].  She also c/o rash under breasts and in groin that itches   Social History   Socioeconomic History  . Marital status: Single    Spouse name: Not on file  . Number of children: Not on file  . Years of education: Not on file  . Highest education level: Not on file  Social Needs  . Financial resource strain: Not on file  . Food insecurity - worry: Not on file  . Food insecurity - inability: Not on file  . Transportation needs - medical: Not on file  . Transportation needs - non-medical: Not on file  Occupational History  . Occupation: Engineer, building services  Tobacco Use  . Smoking status: Never Smoker  . Smokeless tobacco: Never Used  Substance and Sexual Activity  . Alcohol use: No  . Drug use: No  . Sexual activity: Yes    Partners: Male    Birth control/protection: Pill  Other Topics Concern  . Not on file  Social History Narrative   Exercise-- no   Health Maintenance  Topic Date Due  . HEMOGLOBIN A1C  08/07/2016  . URINE MICROALBUMIN  02/07/2017  . PAP SMEAR  03/03/2017  . PNEUMOCOCCAL POLYSACCHARIDE VACCINE (2) 03/18/2017  . OPHTHALMOLOGY EXAM  02/04/2018  . FOOT EXAM  02/09/2018  . TETANUS/TDAP  02/15/2020  . INFLUENZA VACCINE  Completed  . HIV Screening  Completed    The following portions of the patient's history were reviewed and updated as appropriate:  She  has a past medical history of Anemia, Anxiety, Diabetes mellitus, Hyperlipidemia, and Uterine fibroid. She does not have any pertinent problems on file. She  has a past surgical history that includes Cesarean section; g1 p1; Cholecystectomy; and Laparoscopic gelport assisted myomectomy (02/12/2015). Her family history includes Diabetes in her brother, father, and mother;  Migraines in her unknown relative. She  reports that  has never smoked. she has never used smokeless tobacco. She reports that she does not drink alcohol or use drugs. She has a current medication list which includes the following prescription(s): alprazolam, atorvastatin, biotin, diphenhydramine, duloxetine, glimepiride, metformin, multivitamin with minerals, blood glucose meter kit and supplies, fluconazole, nystatin, and sitagliptin. Current Outpatient Medications on File Prior to Visit  Medication Sig Dispense Refill  . atorvastatin (LIPITOR) 40 MG tablet Take 1 tablet (40 mg total) by mouth daily. 30 tablet 5  . Biotin 5000 MCG TABS Take 5,000 mcg by mouth daily.     . diphenhydrAMINE (BENADRYL) 25 MG tablet Take 50 mg by mouth at bedtime as needed for sleep.     . DULoxetine (CYMBALTA) 60 MG capsule Take 1 capsule (60 mg total) by mouth daily. 90 capsule 3  . glimepiride (AMARYL) 4 MG tablet TAKE 1 TABLET BY MOUTH ONCE DAILY BEFORE BREAKFAST **NEW  DOSAGE,  DISCONTINUE  GLIMEPIRIDE  2  MG  TAB** 90 tablet 0  . metFORMIN (GLUCOPHAGE-XR) 750 MG 24 hr tablet Take 2 tablets (1,500 mg total) by mouth daily. 60 tablet 0  . Multiple Vitamin (MULTIVITAMIN WITH MINERALS) TABS tablet Take 1 tablet by mouth daily.     No current facility-administered medications on file prior to visit.    She is allergic to sulfonamide derivatives.Marland Kitchen  Review of Systems Review of Systems  Constitutional: Negative for activity change, appetite change and fatigue.  HENT: Negative for hearing loss, congestion, tinnitus and ear discharge.  dentist q10mEyes: Negative for visual disturbance (see optho q1y -- vision corrected to 20/20 with glasses).  Respiratory: Negative for cough, chest tightness and shortness of breath.   Cardiovascular: Negative for chest pain, palpitations and leg swelling.  Gastrointestinal: Negative for abdominal pain, diarrhea, constipation and abdominal distention.  Genitourinary: Negative for  urgency, frequency, decreased urine volume and difficulty urinating.  Musculoskeletal: Negative for back pain, arthralgias and gait problem.  Skin: Negative for color change, pallor + rash in groin and under breasts.  Neurological: Negative for dizziness, light-headedness, numbness and headaches.  Hematological: Negative for adenopathy. Does not bruise/bleed easily.  Psychiatric/Behavioral: Negative for suicidal ideas, confusion, sleep disturbance, self-injury, dysphoric mood, decreased concentration and agitation.       Objective:    BP 106/80 (BP Location: Right Arm, Cuff Size: Normal)   Pulse (!) 119   Temp 98 F (36.7 C) (Oral)   Resp 16   Ht 5' 8"  (1.727 m)   Wt 203 lb (92.1 kg)   LMP 01/24/2017   SpO2 98%   BMI 30.87 kg/m  General appearance: alert, cooperative, appears stated age and no distress Head: Normocephalic, without obvious abnormality, atraumatic Eyes: conjunctivae/corneas clear. PERRL, EOM's intact. Fundi benign. Ears: normal TM's and external ear canals both ears Nose: Nares normal. Septum midline. Mucosa normal. No drainage or sinus tenderness. Throat: lips, mucosa, and tongue normal; teeth and gums normal Neck: no adenopathy, no carotid bruit, no JVD, supple, symmetrical, trachea midline and thyroid not enlarged, symmetric, no tenderness/mass/nodules Back: symmetric, no curvature. ROM normal. No CVA tenderness. Lungs: clear to auscultation bilaterally Breasts: normal appearance, no masses or tenderness Heart: regular rate and rhythm, S1, S2 normal, no murmur, click, rub or gallop Abdomen: soft, non-tender; bowel sounds normal; no masses,  no organomegaly Pelvic: cervix normal in appearance, external genitalia normal, no adnexal masses or tenderness, no cervical motion tenderness, positive findings: vaginal discharge:  copious, yellow, brown and odorless, rectovaginal septum normal, uterus normal size, shape, and consistency and rectal heme neg brown  stool Extremities: extremities normal, atraumatic, no cyanosis or edema Pulses: 2+ and symmetric Skin: + hyperpigmentation under breasts and in groin Lymph nodes: Cervical, supraclavicular, and axillary nodes normal. Neurologic: Alert and oriented X 3, normal strength and tone. Normal symmetric reflexes. Normal coordination and gait    Assessment:    Healthy female exam.      Plan:    ghm utd Check labs See After Visit Summary for Counseling Recommendations    1. Preventative health care See above  - POCT Urinalysis Dipstick (Automated) - Comprehensive metabolic panel - CBC with Differential/Platelet - Lipid panel - TSH  2. Cervical cancer screening   - Cytology - PAP  3. DM (diabetes mellitus) type II uncontrolled, periph vascular disorder ([place] hgba1c to be checked , minimize simple carbs. Increase exercise as tolerated. Continue current meds  - Comprehensive metabolic panel - Lipid panel - Hemoglobin A1c - blood glucose meter kit and supplies KIT; Dispense based on patient and insurance preference. Use up to four times daily as directed. (FOR ICD-9 250.00, 250.01).  Dispense: 1 each; Refill: 0 - Microalbumin / creatinine urine ratio - sitaGLIPtin (JANUVIA) 100 MG tablet; Take 1 tablet (100 mg total) by mouth daily.  Dispense: 30 tablet; Refill: 5  4. Hyperlipidemia LDL goal <70 Tolerating statin, encouraged heart healthy diet,  avoid trans fats, minimize simple carbs and saturated fats. Increase exercise as tolerated - Comprehensive metabolic panel - Lipid panel  5. Tinea corporis  - nystatin (NYSTATIN) powder; Apply topically 4 (four) times daily.  Dispense: 15 g; Refill: 0  6. Vaginal yeast infection  - fluconazole (DIFLUCAN) 150 MG tablet; Take 1 tablet (150 mg total) by mouth once for 1 dose.  Dispense: 1 tablet; Refill: 0  7. Generalized anxiety disorder stable - ALPRAZolam (XANAX) 0.25 MG tablet; Take 1 tablet (0.25 mg total) by mouth 3 (three) times  daily as needed.  Dispense: 30 tablet; Refill: 1

## 2017-02-10 LAB — PAIN MGMT, PROFILE 8 W/CONF, U
6 ACETYLMORPHINE: NEGATIVE ng/mL (ref <10)
AMPHETAMINES: NEGATIVE ng/mL (ref <500)
Alcohol Metabolites: NEGATIVE ng/mL (ref <500)
Benzodiazepines: NEGATIVE ng/mL (ref <100)
Buprenorphine, Urine: NEGATIVE ng/mL (ref <5)
Cocaine Metabolite: NEGATIVE ng/mL (ref <150)
Creatinine: 117.3 mg/dL
MDMA: NEGATIVE ng/mL (ref <500)
Marijuana Metabolite: NEGATIVE ng/mL (ref <20)
OPIATES: NEGATIVE ng/mL (ref <100)
OXIDANT: NEGATIVE ug/mL (ref <200)
OXYCODONE: NEGATIVE ng/mL (ref <100)
pH: 5.86 (ref 4.5–9.0)

## 2017-02-10 LAB — CBC WITH DIFFERENTIAL/PLATELET
Basophils Absolute: 0.1 10*3/uL (ref 0.0–0.1)
Basophils Relative: 1 % (ref 0.0–3.0)
EOS PCT: 1.1 % (ref 0.0–5.0)
Eosinophils Absolute: 0.1 10*3/uL (ref 0.0–0.7)
HCT: 33.4 % — ABNORMAL LOW (ref 36.0–46.0)
HEMOGLOBIN: 10.4 g/dL — AB (ref 12.0–15.0)
Lymphocytes Relative: 27.8 % (ref 12.0–46.0)
Lymphs Abs: 2 10*3/uL (ref 0.7–4.0)
MCHC: 31.3 g/dL (ref 30.0–36.0)
MCV: 71.5 fl — AB (ref 78.0–100.0)
MONOS PCT: 5.9 % (ref 3.0–12.0)
Monocytes Absolute: 0.4 10*3/uL (ref 0.1–1.0)
NEUTROS PCT: 64.2 % (ref 43.0–77.0)
Neutro Abs: 4.6 10*3/uL (ref 1.4–7.7)
Platelets: 483 10*3/uL — ABNORMAL HIGH (ref 150.0–400.0)
RBC: 4.67 Mil/uL (ref 3.87–5.11)
RDW: 18.9 % — ABNORMAL HIGH (ref 11.5–15.5)
WBC: 7.1 10*3/uL (ref 4.0–10.5)

## 2017-02-10 LAB — LIPID PANEL
CHOL/HDL RATIO: 5
Cholesterol: 216 mg/dL — ABNORMAL HIGH (ref 0–200)
HDL: 40.8 mg/dL (ref >39.00)
LDL CALC: 148 mg/dL — AB (ref 0–99)
NONHDL: 174.93
Triglycerides: 135 mg/dL (ref 0.0–149.0)
VLDL: 27 mg/dL (ref 0.0–40.0)

## 2017-02-10 LAB — COMPREHENSIVE METABOLIC PANEL
ALK PHOS: 127 U/L — AB (ref 39–117)
ALT: 53 U/L — ABNORMAL HIGH (ref 0–35)
AST: 48 U/L — ABNORMAL HIGH (ref 0–37)
Albumin: 4.1 g/dL (ref 3.5–5.2)
BUN: 10 mg/dL (ref 6–23)
CO2: 26 mEq/L (ref 19–32)
Calcium: 9.9 mg/dL (ref 8.4–10.5)
Chloride: 95 mEq/L — ABNORMAL LOW (ref 96–112)
Creatinine, Ser: 0.72 mg/dL (ref 0.40–1.20)
GFR: 113.28 mL/min (ref >60.00)
Glucose, Bld: 317 mg/dL — ABNORMAL HIGH (ref 70–99)
POTASSIUM: 4.5 meq/L (ref 3.5–5.1)
Sodium: 132 mEq/L — ABNORMAL LOW (ref 135–145)
TOTAL PROTEIN: 8.2 g/dL (ref 6.0–8.3)
Total Bilirubin: 0.3 mg/dL (ref 0.2–1.2)

## 2017-02-10 LAB — HEMOGLOBIN A1C: Hgb A1c MFr Bld: 11.1 % — ABNORMAL HIGH (ref 4.6–6.5)

## 2017-02-10 LAB — TSH: TSH: 1.53 u[IU]/mL (ref 0.35–4.50)

## 2017-02-10 LAB — MICROALBUMIN / CREATININE URINE RATIO
Creatinine,U: 121 mg/dL
Microalb Creat Ratio: 3.5 mg/g (ref 0.0–30.0)
Microalb, Ur: 4.2 mg/dL — ABNORMAL HIGH (ref 0.0–1.9)

## 2017-02-12 ENCOUNTER — Other Ambulatory Visit: Payer: Self-pay | Admitting: Family Medicine

## 2017-02-12 DIAGNOSIS — B3731 Acute candidiasis of vulva and vagina: Secondary | ICD-10-CM

## 2017-02-12 DIAGNOSIS — B373 Candidiasis of vulva and vagina: Secondary | ICD-10-CM

## 2017-02-12 LAB — CYTOLOGY - PAP
BACTERIAL VAGINITIS: POSITIVE — AB
CHLAMYDIA, DNA PROBE: NEGATIVE
Candida vaginitis: POSITIVE — AB
DIAGNOSIS: NEGATIVE
HPV (WINDOPATH): NOT DETECTED
NEISSERIA GONORRHEA: NEGATIVE
Trichomonas: NEGATIVE

## 2017-02-12 NOTE — Telephone Encounter (Signed)
Called to ask Pt if she needs the automated refill for diflucan that was requested through the system. If Pt needs I will refill for her. No answer, left message for Pt to call office back when she gets the chance.

## 2017-02-12 NOTE — Telephone Encounter (Signed)
Pt called back stated yes she needs a refill on her diflucan.

## 2017-02-15 ENCOUNTER — Other Ambulatory Visit: Payer: Self-pay | Admitting: Family Medicine

## 2017-02-15 DIAGNOSIS — B9689 Other specified bacterial agents as the cause of diseases classified elsewhere: Secondary | ICD-10-CM

## 2017-02-15 DIAGNOSIS — N760 Acute vaginitis: Principal | ICD-10-CM

## 2017-02-15 MED ORDER — METRONIDAZOLE 500 MG PO TABS: 500.0000 mg | ORAL_TABLET | Freq: Two times a day (BID) | ORAL | 0 refills | Status: DC

## 2017-02-15 NOTE — Progress Notes (Signed)
Anemic--- are you taking iron daily? Cholesterol--- LDL goal < 70,  HDL >40,  TG < 150.  Diet and exercise will increase HDL and decrease LDL and TG.  Fish,  Fish Oil, Flaxseed oil will also help increase the HDL and decrease Triglycerides.   Recheck labs in 3 months Dm not controlled --- pt restarted on januvia + yeast and bv--- on diflucan add flagyl 500 mg bid x 7 days

## 2017-02-17 ENCOUNTER — Telehealth: Payer: Self-pay

## 2017-02-17 NOTE — Telephone Encounter (Signed)
Copied from [place] [id number]. Topic: General - Call Back - No Documentation >> Feb 12, 2017 10:39 AM Marin Olp L wrote: Reason for CRM: Patient missed a call from [facility] but no documentation. Please advise.   I do not recall calling this Pt, however I see that she had a + results for yeast and BV and I don't see where she's been notified in her chart. That's the only thing I can see in her chart that would prompt a call. Called Pt back this morning and left a VM for her to call the office back when she gets a chance.

## 2017-02-18 ENCOUNTER — Other Ambulatory Visit: Payer: Self-pay | Admitting: Family Medicine

## 2017-02-18 DIAGNOSIS — B354 Tinea corporis: Secondary | ICD-10-CM

## 2017-04-09 ENCOUNTER — Other Ambulatory Visit: Payer: Self-pay | Admitting: Family Medicine

## 2017-04-09 DIAGNOSIS — E118 Type 2 diabetes mellitus with unspecified complications: Principal | ICD-10-CM

## 2017-04-09 DIAGNOSIS — E1142 Type 2 diabetes mellitus with diabetic polyneuropathy: Secondary | ICD-10-CM

## 2017-04-09 DIAGNOSIS — E1151 Type 2 diabetes mellitus with diabetic peripheral angiopathy without gangrene: Secondary | ICD-10-CM

## 2017-04-09 DIAGNOSIS — IMO0002 Reserved for concepts with insufficient information to code with codable children: Secondary | ICD-10-CM

## 2017-04-09 DIAGNOSIS — E1165 Type 2 diabetes mellitus with hyperglycemia: Secondary | ICD-10-CM

## 2017-04-10 NOTE — Telephone Encounter (Signed)
Received refill request for DULoxetine (CYMBALTA) 60 MG capsule and metFORMIN (GLUCOPHAGE-XR) 750 MG 24 hr tablet. Last office visit 02/09/17 and last refill 02/08/16 (duloxetine) and 10/01/16 on (metformin). Refill sent.

## 2017-07-10 ENCOUNTER — Encounter (HOSPITAL_BASED_OUTPATIENT_CLINIC_OR_DEPARTMENT_OTHER): Payer: Self-pay | Admitting: Emergency Medicine

## 2017-07-10 ENCOUNTER — Emergency Department (HOSPITAL_BASED_OUTPATIENT_CLINIC_OR_DEPARTMENT_OTHER): Payer: BLUE CROSS/BLUE SHIELD

## 2017-07-10 ENCOUNTER — Other Ambulatory Visit: Payer: Self-pay

## 2017-07-10 ENCOUNTER — Emergency Department (HOSPITAL_BASED_OUTPATIENT_CLINIC_OR_DEPARTMENT_OTHER)
Admission: EM | Admit: 2017-07-10 | Discharge: 2017-07-10 | Disposition: A | Discharge status: Home / Self Care | Payer: BLUE CROSS/BLUE SHIELD | Attending: Emergency Medicine | Admitting: Emergency Medicine

## 2017-07-10 DIAGNOSIS — R109 Unspecified abdominal pain: Secondary | ICD-10-CM | POA: Insufficient documentation

## 2017-07-10 DIAGNOSIS — Z3202 Encounter for pregnancy test, result negative: Secondary | ICD-10-CM | POA: Insufficient documentation

## 2017-07-10 DIAGNOSIS — R739 Hyperglycemia, unspecified: Secondary | ICD-10-CM

## 2017-07-10 DIAGNOSIS — E1165 Type 2 diabetes mellitus with hyperglycemia: Secondary | ICD-10-CM | POA: Insufficient documentation

## 2017-07-10 DIAGNOSIS — Z7984 Long term (current) use of oral hypoglycemic drugs: Secondary | ICD-10-CM | POA: Insufficient documentation

## 2017-07-10 DIAGNOSIS — R3912 Poor urinary stream: Secondary | ICD-10-CM | POA: Diagnosis not present

## 2017-07-10 DIAGNOSIS — Z79899 Other long term (current) drug therapy: Secondary | ICD-10-CM | POA: Insufficient documentation

## 2017-07-10 LAB — URINALYSIS, ROUTINE W REFLEX MICROSCOPIC
BILIRUBIN URINE: NEGATIVE
Hgb urine dipstick: NEGATIVE
KETONES UR: 15 mg/dL — AB
LEUKOCYTES UA: NEGATIVE
NITRITE: NEGATIVE
PH: 5.5 (ref 5.0–8.0)
PROTEIN: NEGATIVE mg/dL
Specific Gravity, Urine: >1.03 — ABNORMAL HIGH (ref 1.005–1.030)

## 2017-07-10 LAB — URINALYSIS, MICROSCOPIC (REFLEX): RBC / HPF: NONE SEEN RBC/hpf (ref 0–5)

## 2017-07-10 LAB — CBC
HEMATOCRIT: 32.7 % — AB (ref 36.0–46.0)
Hemoglobin: 10.7 g/dL — ABNORMAL LOW (ref 12.0–15.0)
MCH: 23.8 pg — AB (ref 26.0–34.0)
MCHC: 32.7 g/dL (ref 30.0–36.0)
MCV: 72.8 fL — ABNORMAL LOW (ref 78.0–100.0)
Platelets: 394 10*3/uL (ref 150–400)
RBC: 4.49 MIL/uL (ref 3.87–5.11)
RDW: 17 % — AB (ref 11.5–15.5)
WBC: 7 10*3/uL (ref 4.0–10.5)

## 2017-07-10 LAB — BASIC METABOLIC PANEL
Anion gap: 8 (ref 5–15)
BUN: 7 mg/dL (ref 6–20)
CALCIUM: 9.1 mg/dL (ref 8.9–10.3)
CHLORIDE: 103 mmol/L (ref 98–111)
CO2: 25 mmol/L (ref 22–32)
CREATININE: 0.67 mg/dL (ref 0.44–1.00)
GFR calc non Af Amer: >60 mL/min (ref >60)
Glucose, Bld: 245 mg/dL — ABNORMAL HIGH (ref 70–99)
Potassium: 4 mmol/L (ref 3.5–5.1)
SODIUM: 136 mmol/L (ref 135–145)

## 2017-07-10 LAB — PREGNANCY, URINE: Preg Test, Ur: NEGATIVE

## 2017-07-10 MED ORDER — SODIUM CHLORIDE 0.9 % IV BOLUS
1000.0000 mL | Freq: Once | INTRAVENOUS | Status: AC
  Administered 2017-07-10: 1000 mL via INTRAVENOUS

## 2017-07-10 MED ORDER — MORPHINE SULFATE (PF) 4 MG/ML IV SOLN
4.0000 mg | Freq: Once | INTRAVENOUS | Status: AC
  Administered 2017-07-10: 4 mg via INTRAVENOUS
  Filled 2017-07-10: qty 1

## 2017-07-10 MED ORDER — IBUPROFEN 600 MG PO TABS: 600.0000 mg | ORAL_TABLET | Freq: Four times a day (QID) | ORAL | 0 refills | Status: DC | PRN

## 2017-07-10 MED ORDER — ONDANSETRON HCL 4 MG PO TABS: 4.0000 mg | ORAL_TABLET | Freq: Three times a day (TID) | ORAL | 0 refills | Status: DC | PRN

## 2017-07-10 MED ORDER — ONDANSETRON HCL 4 MG/2ML IJ SOLN
4.0000 mg | Freq: Once | INTRAMUSCULAR | Status: AC
  Administered 2017-07-10: 4 mg via INTRAVENOUS
  Filled 2017-07-10: qty 2

## 2017-07-10 MED FILL — ONDANSETRON HCL 4 MG TABLET: 4 | 4 days supply | Qty: 12 | Fill #0

## 2017-07-10 MED FILL — IBUPROFEN 600 MG TABLET: 600 | 7 days supply | Qty: 30 | Fill #0

## 2017-07-10 NOTE — ED Notes (Signed)
CT must wait for results of urine pregnancy test prior to imaging abd/pelvis per [facility] radiology protocol

## 2017-07-10 NOTE — ED Provider Notes (Signed)
[facility] EMERGENCY DEPARTMENT Provider Note   CSN: [id number] Arrival date & time: 07/10/17  5573     History   Chief Complaint Chief Complaint  Patient presents with  . Flank Pain    HPI Gladys Deckard is a 42 y.o. female.  HPI  42 year old female with history of diabetes, uterine fibroid, anemia presented for evaluation of flank pain.  For the past 2 to 3 days patient has had pain to her left flank.  Pain initially started in her lower back on the first day but spreads towards her left flank.  Pain comes in waves, intense, nothing seems to make it better or worse.  She tries different positioning with minimal improvement.  She tries taking over-the-counter medication including Tylenol, ibuprofen, heating pad without relief.  Pain is currently 8 out of 10.  No complaints of fever, chills, lightheadedness, dizziness, chest pain, shortness of breath, productive cough, dysuria, hematuria, vaginal bleeding or vaginal discharge.  She did report decreasing urine production and having left flank pain while she is trying to strain to urinate.  She had a remote history of kidney stone 25 years ago.  She denies any recent strenuous activities or recent injury.  Past Medical History:  Diagnosis Date  . Anemia   . Anxiety   . Diabetes mellitus   . Hyperlipidemia   . Uterine fibroid     Patient Active Problem List   Diagnosis Date Noted  . Fibroids   . Fibroid, uterine   . Obesity (BMI 30-39.9) 02/21/2013  . Hyperlipidemia 03/13/2011  . MORBID OBESITY 08/08/2008  . Uncontrolled type 2 diabetes mellitus with complication, without long-term current use of insulin ([place]) 03/31/2008  . UNSPECIFIED ANEMIA 03/31/2008  . GLYCOSURIA 03/29/2008  . LIVER FUNCTION TESTS, ABNORMAL, HX OF 02/28/2007  . ABDOMINAL PAIN 02/17/2007  . ANXIETY 11/18/2006  . GESTATIONAL DIABETES 11/18/2006  . FATIGUE 11/18/2006  . HEADACHE 11/18/2006  . ABDOMINAL PAIN, EPIGASTRIC 11/18/2006    Past  Surgical History:  Procedure Laterality Date  . CESAREAN SECTION    . CHOLECYSTECTOMY    . g1 p1    . LAPAROSCOPIC GELPORT ASSISTED MYOMECTOMY  02/12/2015   Dr Barbie Banner     OB History   None      Home Medications    Prior to Admission medications   Medication Sig Start Date End Date Taking? Authorizing Provider  ALPRAZolam (XANAX) 0.25 MG tablet Take 1 tablet (0.25 mg total) by mouth 3 (three) times daily as needed. 02/09/17   Ann Held, DO  Biotin 5000 MCG TABS Take 5,000 mcg by mouth daily.     [provider]  blood glucose meter kit and supplies KIT Dispense based on patient and insurance preference. Use up to four times daily as directed. (FOR ICD-9 250.00, 250.01). 02/09/17   Carollee Herter, Alferd Apa, DO  diphenhydrAMINE (BENADRYL) 25 MG tablet Take 50 mg by mouth at bedtime as needed for sleep.     [provider]  DULoxetine (CYMBALTA) 60 MG capsule TAKE ONE CAPSULE BY MOUTH ONCE DAILY 04/10/17   Carollee Herter, Yvonne R, DO  glimepiride (AMARYL) 4 MG tablet TAKE 1 TABLET BY MOUTH ONCE DAILY BEFORE BREAKFAST **NEW  DOSAGE,  DISCONTINUE  GLIMEPIRIDE  2  MG  TAB** 12/29/16   Lowne Chase, Alferd Apa, DO  metFORMIN (GLUCOPHAGE-XR) 750 MG 24 hr tablet Take 2 tablets (1,500 mg total) by mouth daily. 10/01/16   Ann Held, DO  Multiple Vitamin (MULTIVITAMIN  WITH MINERALS) TABS tablet Take 1 tablet by mouth daily.    [provider]    Family History Family History  Problem Relation Age of Onset  . Migraines Unknown   . Diabetes Mother   . Diabetes Father   . Diabetes Brother     Social History Social History   Tobacco Use  . Smoking status: Never Smoker  . Smokeless tobacco: Never Used  Substance Use Topics  . Alcohol use: No  . Drug use: No     Allergies   Sulfonamide derivatives   Review of Systems Review of Systems  All other systems reviewed and are negative.    Physical Exam Updated Vital Signs BP (!) 139/93 (BP  Location: Left Arm)   Pulse (!) 106   Temp 98.1 F (36.7 C) (Oral)   Resp 16   Ht _0  (1.676 m)   Wt 91.2 kg (201 lb)   LMP 06/09/2017   SpO2 98%   BMI 32.44 kg/m   Physical Exam  Constitutional: She appears well-developed and well-nourished. No distress.  HENT:  Head: Atraumatic.  Eyes: Conjunctivae are normal.  Neck: Neck supple.  Cardiovascular: Normal rate and regular rhythm.  Pulmonary/Chest: Effort normal and breath sounds normal.  Abdominal: Soft. Bowel sounds are normal. She exhibits no distension. There is no tenderness.  Genitourinary:  Genitourinary Comments: Left CVA tenderness on percussion  Musculoskeletal:  No midline spine tenderness crepitus or step-off  Neurological: She is alert.  Skin: No rash noted.  Psychiatric: She has a normal mood and affect.  Nursing note and vitals reviewed.    ED Treatments / Results  Labs (all labs ordered are listed, but only abnormal results are displayed) Labs Reviewed  URINALYSIS, ROUTINE W REFLEX MICROSCOPIC - Abnormal; Notable for the following components:      Result Value   Specific Gravity, Urine >1.030 (*)    Glucose, UA >=500 (*)    Ketones, ur 15 (*)    All other components within normal limits  BASIC METABOLIC PANEL - Abnormal; Notable for the following components:   Glucose, Bld 245 (*)    All other components within normal limits  CBC - Abnormal; Notable for the following components:   Hemoglobin 10.7 (*)    HCT 32.7 (*)    MCV 72.8 (*)    MCH 23.8 (*)    RDW 17.0 (*)    All other components within normal limits  URINALYSIS, MICROSCOPIC (REFLEX) - Abnormal; Notable for the following components:   Bacteria, UA FEW (*)    All other components within normal limits  PREGNANCY, URINE    EKG None  Radiology Ct Renal Stone Study  Result Date: 07/10/2017 CLINICAL DATA:  Left flank pain x2 days. History of nephrolithiasis. EXAM: CT ABDOMEN AND PELVIS WITHOUT CONTRAST TECHNIQUE: Multidetector CT  imaging of the abdomen and pelvis was performed following the standard protocol without IV contrast. COMPARISON:  02/23/2007 FINDINGS: Lower chest: No acute abnormality. Hepatobiliary: Fatty infiltration of the liver. Cholecystectomy clips. Pancreas: Unremarkable. No pancreatic ductal dilatation or surrounding inflammatory changes. Spleen: Normal in size without focal abnormality. Adrenals/Urinary Tract: Normal adrenal glands. No nephrolithiasis or hydronephrosis. Ureters decompressed. Urinary bladder incompletely distended. Stomach/Bowel: Stomach and small bowel are nondilated. Normal appendix. Moderate proximal colonic fecal material, decompressed distally. No focal wall thickening or regional inflammatory/edematous change. Vascular/Lymphatic: No significant vascular findings are present. No enlarged abdominal or pelvic lymph nodes. Reproductive: Partially calcified infarcted uterine fibroids. No adnexal mass. Other: No ascites.  No  free air. Musculoskeletal: Mild disc narrowing L4-5 with anterior spurring. No fracture or worrisome bone lesion. IMPRESSION: 1. No acute abdominal findings.  No urolithiasis or hydronephrosis. Electronically Signed   By: Lucrezia Europe M.D.   On: 07/10/2017 12:42    Procedures Procedures (including critical care time)  Medications Ordered in ED Medications  sodium chloride 0.9 % bolus 1,000 mL (0 mLs Intravenous Stopped 07/10/17 1201)  morphine 4 MG/ML injection 4 mg (4 mg Intravenous Given 07/10/17 1043)  ondansetron (ZOFRAN) injection 4 mg (4 mg Intravenous Given 07/10/17 1043)     Initial Impression / Assessment and Plan / ED Course  I have reviewed the triage vital signs and the nursing notes.  Pertinent labs & imaging results that were available during my care of the patient were reviewed by me and considered in my medical decision making (see chart for details).     BP 118/78 (BP Location: Left Arm)   Pulse 88   Temp 98.4 F (36.9 C) (Oral)   Resp 20   Ht 5'  6" (1.676 m)   Wt 91.2 kg (201 lb)   LMP 06/09/2017   SpO2 97%   BMI 32.44 kg/m    Final Clinical Impressions(s) / ED Diagnoses   Final diagnoses:  Left flank pain  Hyperglycemia    ED Discharge Orders        Ordered    ibuprofen (ADVIL,MOTRIN) 600 MG tablet  Every 6 hours PRN     07/10/17 1331    ondansetron (ZOFRAN) 4 MG tablet  Every 8 hours PRN     07/10/17 1331     10:30 AM Patient here with left flank pain suggestive of kidney stone.  No abdominal pain on exam.  Low suspicion for AAA or dissection.  Doubt pancreatitis.  Work-up initiated.  Will obtain CT renal stone study for further care.  She found to be tachycardic and did report decreasing urinary output.  IV fluid given.  1:32 PM UA without concerning feature.  Labs remarkable for hyperglycemia with a CBG of 245 and normal anion gap.  Normal WBC.  CT renal stone study without any acute abnormal finding.  At this time no definitive diagnosis were made however patient is well-appearing and stable for discharge.  Encourage patient to follow-up with PCP for further care.  Return precautions discussed.   Domenic Moras, PA-C 07/10/17 1333    Tegeler, Gwenyth Allegra, MD 07/11/17 2028

## 2017-07-10 NOTE — ED Triage Notes (Signed)
Left flank pain x 2 days.  Pain progressing over time.  Pt denies dysuria but decreased urine output.  Voiding last night decreases pain.  No N/V/D.

## 2017-07-20 ENCOUNTER — Ambulatory Visit: Payer: BLUE CROSS/BLUE SHIELD | Admitting: Family Medicine

## 2017-10-13 ENCOUNTER — Other Ambulatory Visit: Payer: Self-pay | Admitting: Family Medicine

## 2017-10-13 DIAGNOSIS — E1151 Type 2 diabetes mellitus with diabetic peripheral angiopathy without gangrene: Secondary | ICD-10-CM

## 2017-10-13 DIAGNOSIS — IMO0002 Reserved for concepts with insufficient information to code with codable children: Secondary | ICD-10-CM

## 2017-10-13 DIAGNOSIS — E1165 Type 2 diabetes mellitus with hyperglycemia: Principal | ICD-10-CM

## 2017-12-03 DIAGNOSIS — Z01419 Encounter for gynecological examination (general) (routine) without abnormal findings: Secondary | ICD-10-CM | POA: Diagnosis not present

## 2017-12-03 DIAGNOSIS — Z6833 Body mass index (BMI) 33.0-33.9, adult: Secondary | ICD-10-CM | POA: Diagnosis not present

## 2017-12-21 DIAGNOSIS — D251 Intramural leiomyoma of uterus: Secondary | ICD-10-CM | POA: Diagnosis not present

## 2017-12-21 DIAGNOSIS — N924 Excessive bleeding in the premenopausal period: Secondary | ICD-10-CM | POA: Diagnosis not present

## 2017-12-21 DIAGNOSIS — N939 Abnormal uterine and vaginal bleeding, unspecified: Secondary | ICD-10-CM | POA: Diagnosis not present

## 2018-01-17 ENCOUNTER — Other Ambulatory Visit: Payer: Self-pay | Admitting: Family Medicine

## 2018-01-17 DIAGNOSIS — B354 Tinea corporis: Secondary | ICD-10-CM

## 2018-01-22 ENCOUNTER — Ambulatory Visit (INDEPENDENT_AMBULATORY_CARE_PROVIDER_SITE_OTHER): Payer: BLUE CROSS/BLUE SHIELD | Admitting: Family Medicine

## 2018-01-22 ENCOUNTER — Encounter: Payer: Self-pay | Admitting: Family Medicine

## 2018-01-22 VITALS — BP 122/88 | HR 98 | Temp 98.6°F | Resp 16 | Ht 68.0 in | Wt 199.8 lb

## 2018-01-22 DIAGNOSIS — J0140 Acute pansinusitis, unspecified: Secondary | ICD-10-CM

## 2018-01-22 MED ORDER — FLUTICASONE PROPIONATE 50 MCG/ACT NA SUSP: 2.0000 | Freq: Every day | NASAL | 6 refills | Status: DC

## 2018-01-22 MED ORDER — AMOXICILLIN-POT CLAVULANATE 875-125 MG PO TABS: 1.0000 | ORAL_TABLET | Freq: Two times a day (BID) | ORAL | 0 refills | Status: DC

## 2018-01-22 NOTE — Progress Notes (Signed)
Patient ID: Cheryl Hart, female    DOB: 14-Nov-1975  Age: 43 y.o. MRN: [id number]    Subjective:  Subjective  HPI [name] presents for congestion   + sinus pressure / ha x 3 months with nasal congestion and green mucus.  No fevers  + cough, non productive   She has used afrin nasal spray and mucinex nasal spray , claritin d or sudafed with little relief.  Symptoms are worsening.   Review of Systems  Constitutional: Negative for chills and fever.  HENT: Positive for congestion, postnasal drip, rhinorrhea, sinus pressure and sinus pain. Negative for sneezing and sore throat.   Respiratory: Positive for cough. Negative for chest tightness and wheezing.   Cardiovascular: Negative for chest pain, palpitations and leg swelling.  Allergic/Immunologic: Negative for environmental allergies.    History Past Medical History:  Diagnosis Date  . Anemia   . Anxiety   . Diabetes mellitus   . Hyperlipidemia   . Uterine fibroid     She has a past surgical history that includes Cesarean section; g1 p1; Cholecystectomy; and Laparoscopic gelport assisted myomectomy (02/12/2015).   Her family history includes Diabetes in her brother, father, and mother; Migraines in her unknown relative.She reports that she has never smoked. She has never used smokeless tobacco. She reports that she does not drink alcohol or use drugs.  Current Outpatient Medications on File Prior to Visit  Medication Sig Dispense Refill  . ALPRAZolam (XANAX) 0.25 MG tablet Take 1 tablet (0.25 mg total) by mouth 3 (three) times daily as needed. 30 tablet 1  . Biotin 5000 MCG TABS Take 5,000 mcg by mouth daily.     . blood glucose meter kit and supplies KIT Dispense based on patient and insurance preference. Use up to four times daily as directed. (FOR ICD-9 250.00, 250.01). 1 each 0  . diphenhydrAMINE (BENADRYL) 25 MG tablet Take 50 mg by mouth at bedtime as needed for sleep.     . DULoxetine (CYMBALTA) 60 MG capsule TAKE ONE  CAPSULE BY MOUTH ONCE DAILY 90 capsule 3  . glimepiride (AMARYL) 4 MG tablet Take 1 tablet (4 mg total) by mouth daily with breakfast. 90 tablet 0  . ibuprofen (ADVIL,MOTRIN) 600 MG tablet Take 1 tablet (600 mg total) by mouth every 6 (six) hours as needed. 30 tablet 0  . metFORMIN (GLUCOPHAGE-XR) 750 MG 24 hr tablet Take 2 tablets (1,500 mg total) by mouth daily. 60 tablet 0  . Multiple Vitamin (MULTIVITAMIN WITH MINERALS) TABS tablet Take 1 tablet by mouth daily.    Marland Kitchen nystatin (NYSTATIN) powder Apply topically 4 (four) times daily. 60 g 0   No current facility-administered medications on file prior to visit.      Objective:  Objective  Physical Exam Constitutional:      Appearance: She is well-developed. She is diaphoretic.  HENT:     Right Ear: External ear normal.     Left Ear: External ear normal.     Nose: Mucosal edema and rhinorrhea present. No nasal deformity.     Right Sinus: Maxillary sinus tenderness and frontal sinus tenderness present.     Left Sinus: Maxillary sinus tenderness and frontal sinus tenderness present.     Mouth/Throat:     Pharynx: No oropharyngeal exudate.  Eyes:     General:        Right eye: No discharge.        Left eye: No discharge.     Conjunctiva/sclera: Conjunctivae normal.  Neck:  Musculoskeletal: Normal range of motion and neck supple.  Cardiovascular:     Rate and Rhythm: Normal rate and regular rhythm.     Heart sounds: Normal heart sounds. No murmur.  Pulmonary:     Effort: Pulmonary effort is normal. No respiratory distress.     Breath sounds: Normal breath sounds. No wheezing or rales.  Chest:     Chest wall: No tenderness.  Lymphadenopathy:     Cervical: No cervical adenopathy.  Skin:    General: Skin is warm.  Neurological:     Mental Status: She is alert and oriented to person, place, and time.    BP 122/88   Pulse 98   Temp 98.6 F (37 C) (Oral)   Resp 16   Ht _0  (1.727 m)   Wt 199 lb 12.8 oz (90.6 kg)   LMP  01/16/2018   SpO2 98%   BMI 30.38 kg/m  Wt Readings from Last 3 Encounters:  01/22/18 199 lb 12.8 oz (90.6 kg)  07/10/17 201 lb (91.2 kg)  02/09/17 203 lb (92.1 kg)     Lab Results  Component Value Date   WBC 7.0 07/10/2017   HGB 10.7 (L) 07/10/2017   HCT 32.7 (L) 07/10/2017   PLT 394 07/10/2017   GLUCOSE 245 (H) 07/10/2017   CHOL 216 (H) 02/09/2017   TRIG 135.0 02/09/2017   HDL 40.80 02/09/2017   LDLDIRECT 92.0 02/01/2015   LDLCALC 148 (H) 02/09/2017   ALT 53 (H) 02/09/2017   AST 48 (H) 02/09/2017   NA 136 07/10/2017   K 4.0 07/10/2017   CL 103 07/10/2017   CREATININE 0.67 07/10/2017   BUN 7 07/10/2017   CO2 25 07/10/2017   TSH 1.53 02/09/2017   INR 1.12 02/12/2015   HGBA1C 11.1 (H) 02/09/2017   MICROALBUR 4.2 (H) 02/09/2017    Ct Renal Stone Study  Result Date: 07/10/2017 CLINICAL DATA:  Left flank pain x2 days. History of nephrolithiasis. EXAM: CT ABDOMEN AND PELVIS WITHOUT CONTRAST TECHNIQUE: Multidetector CT imaging of the abdomen and pelvis was performed following the standard protocol without IV contrast. COMPARISON:  02/23/2007 FINDINGS: Lower chest: No acute abnormality. Hepatobiliary: Fatty infiltration of the liver. Cholecystectomy clips. Pancreas: Unremarkable. No pancreatic ductal dilatation or surrounding inflammatory changes. Spleen: Normal in size without focal abnormality. Adrenals/Urinary Tract: Normal adrenal glands. No nephrolithiasis or hydronephrosis. Ureters decompressed. Urinary bladder incompletely distended. Stomach/Bowel: Stomach and small bowel are nondilated. Normal appendix. Moderate proximal colonic fecal material, decompressed distally. No focal wall thickening or regional inflammatory/edematous change. Vascular/Lymphatic: No significant vascular findings are present. No enlarged abdominal or pelvic lymph nodes. Reproductive: Partially calcified infarcted uterine fibroids. No adnexal mass. Other: No ascites.  No free air. Musculoskeletal: Mild  disc narrowing L4-5 with anterior spurring. No fracture or worrisome bone lesion. IMPRESSION: 1. No acute abdominal findings.  No urolithiasis or hydronephrosis. Electronically Signed   By: Lucrezia Europe M.D.   On: 07/10/2017 12:42     Assessment & Plan:  Plan  I have discontinued Suriya Sui's ondansetron. I am also having her start on amoxicillin-clavulanate and fluticasone. Additionally, I am having her maintain her multivitamin with minerals, Biotin, diphenhydrAMINE, metFORMIN, blood glucose meter kit and supplies, ALPRAZolam, DULoxetine, ibuprofen, nystatin, and glimepiride.  Meds ordered this encounter  Medications  . amoxicillin-clavulanate (AUGMENTIN) 875-125 MG tablet    Sig: Take 1 tablet by mouth 2 (two) times daily.    Dispense:  20 tablet    Refill:  0  . fluticasone (FLONASE) 50  MCG/ACT nasal spray    Sig: Place 2 sprays into both nostrils daily.    Dispense:  16 g    Refill:  6    Problem List Items Addressed This Visit    None    Visit Diagnoses    Acute pansinusitis, recurrence not specified    -  Primary   Relevant Medications   amoxicillin-clavulanate (AUGMENTIN) 875-125 MG tablet   fluticasone (FLONASE) 50 MCG/ACT nasal spray    ok to con't antihistamine, mucinex  rto prn  Follow-up: Return if symptoms worsen or fail to improve.  Ann Held, DO

## 2018-01-22 NOTE — Patient Instructions (Signed)
Sinusitis, Adult  Sinusitis is inflammation of your sinuses. Sinuses are hollow spaces in the bones around your face. Your sinuses are located:   Around your eyes.   In the middle of your forehead.   Behind your nose.   In your cheekbones.  Mucus normally drains out of your sinuses. When your nasal tissues become inflamed or swollen, mucus can become trapped or blocked. This allows bacteria, viruses, and fungi to grow, which leads to infection. Most infections of the sinuses are caused by a virus.  Sinusitis can develop quickly. It can last for up to 4 weeks (acute) or for more than 12 weeks (chronic). Sinusitis often develops after a cold.  What are the causes?  This condition is caused by anything that creates swelling in the sinuses or stops mucus from draining. This includes:   Allergies.   Asthma.   Infection from bacteria or viruses.   Deformities or blockages in your nose or sinuses.   Abnormal growths in the nose (nasal polyps).   Pollutants, such as chemicals or irritants in the air.   Infection from fungi (rare).  What increases the risk?  You are more likely to develop this condition if you:   Have a weak body defense system (immune system).   Do a lot of swimming or diving.   Overuse nasal sprays.   Smoke.  What are the signs or symptoms?  The main symptoms of this condition are pain and a feeling of pressure around the affected sinuses. Other symptoms include:   Stuffy nose or congestion.   Thick drainage from your nose.   Swelling and warmth over the affected sinuses.   Headache.   Upper toothache.   A cough that may get worse at night.   Extra mucus that collects in the throat or the back of the nose (postnasal drip).   Decreased sense of smell and taste.   Fatigue.   A fever.   Sore throat.   Bad breath.  How is this diagnosed?  This condition is diagnosed based on:   Your symptoms.   Your medical history.   A physical exam.   Tests to find out if your condition is  acute or chronic. This may include:  ? Checking your nose for nasal polyps.  ? Viewing your sinuses using a device that has a light (endoscope).  ? Testing for allergies or bacteria.  ? Imaging tests, such as an MRI or CT scan.  In rare cases, a bone biopsy may be done to rule out more serious types of fungal sinus disease.  How is this treated?  Treatment for sinusitis depends on the cause and whether your condition is chronic or acute.   If caused by a virus, your symptoms should go away on their own within 10 days. You may be given medicines to relieve symptoms. They include:  ? Medicines that shrink swollen nasal passages (topical intranasal decongestants).  ? Medicines that treat allergies (antihistamines).  ? A spray that eases inflammation of the nostrils (topical intranasal corticosteroids).  ? Rinses that help get rid of thick mucus in your nose (nasal saline washes).   If caused by bacteria, your health care provider may recommend waiting to see if your symptoms improve. Most bacterial infections will get better without antibiotic medicine. You may be given antibiotics if you have:  ? A severe infection.  ? A weak immune system.   If caused by narrow nasal passages or nasal polyps, you may need   to have surgery.  Follow these instructions at home:  Medicines   Take, use, or apply over-the-counter and prescription medicines only as told by your health care provider. These may include nasal sprays.   If you were prescribed an antibiotic medicine, take it as told by your health care provider. Do not stop taking the antibiotic even if you start to feel better.  Hydrate and humidify     Drink enough fluid to keep your urine pale yellow. Staying hydrated will help to thin your mucus.   Use a cool mist humidifier to keep the humidity level in your home above 50%.   Inhale steam for 10-15 minutes, 3-4 times a day, or as told by your health care provider. You can do this in the bathroom while a hot shower is  running.   Limit your exposure to cool or dry air.  Rest   Rest as much as possible.   Sleep with your head raised (elevated).   Make sure you get enough sleep each night.  General instructions     Apply a warm, moist washcloth to your face 3-4 times a day or as told by your health care provider. This will help with discomfort.   Wash your hands often with soap and water to reduce your exposure to germs. If soap and water are not available, use hand sanitizer.   Do not smoke. Avoid being around people who are smoking (secondhand smoke).   Keep all follow-up visits as told by your health care provider. This is important.  Contact a health care provider if:   You have a fever.   Your symptoms get worse.   Your symptoms do not improve within 10 days.  Get help right away if:   You have a severe headache.   You have persistent vomiting.   You have severe pain or swelling around your face or eyes.   You have vision problems.   You develop confusion.   Your neck is stiff.   You have trouble breathing.  Summary   Sinusitis is soreness and inflammation of your sinuses. Sinuses are hollow spaces in the bones around your face.   This condition is caused by nasal tissues that become inflamed or swollen. The swelling traps or blocks the flow of mucus. This allows bacteria, viruses, and fungi to grow, which leads to infection.   If you were prescribed an antibiotic medicine, take it as told by your health care provider. Do not stop taking the antibiotic even if you start to feel better.   Keep all follow-up visits as told by your health care provider. This is important.  This information is not intended to replace advice given to you by your health care provider. Make sure you discuss any questions you have with your health care provider.  Document Released: 12/30/2004 Document Revised: 06/01/2017 Document Reviewed: 06/01/2017  Elsevier Interactive Patient Education  2019 [employer]

## 2018-02-04 DIAGNOSIS — N920 Excessive and frequent menstruation with regular cycle: Secondary | ICD-10-CM | POA: Diagnosis not present

## 2018-02-15 DIAGNOSIS — D649 Anemia, unspecified: Secondary | ICD-10-CM | POA: Diagnosis not present

## 2018-04-14 ENCOUNTER — Other Ambulatory Visit: Payer: Self-pay | Admitting: Family Medicine

## 2018-04-14 DIAGNOSIS — E1151 Type 2 diabetes mellitus with diabetic peripheral angiopathy without gangrene: Secondary | ICD-10-CM

## 2018-04-14 DIAGNOSIS — IMO0002 Reserved for concepts with insufficient information to code with codable children: Secondary | ICD-10-CM

## 2018-04-14 DIAGNOSIS — E1165 Type 2 diabetes mellitus with hyperglycemia: Principal | ICD-10-CM

## 2018-04-22 ENCOUNTER — Telehealth: Payer: Self-pay | Admitting: *Deleted

## 2018-04-22 NOTE — Telephone Encounter (Signed)
Pt is past due for follow up of anxiety, hyperlipidemia and DM II. Left detailed message on pt's cell voicemail to call the office to discuss Virtual Office visit options and to schedule appointment.

## 2018-04-26 ENCOUNTER — Other Ambulatory Visit: Payer: Self-pay | Admitting: Family Medicine

## 2018-04-26 DIAGNOSIS — E1165 Type 2 diabetes mellitus with hyperglycemia: Secondary | ICD-10-CM

## 2018-04-26 DIAGNOSIS — IMO0002 Reserved for concepts with insufficient information to code with codable children: Secondary | ICD-10-CM

## 2018-04-26 DIAGNOSIS — E118 Type 2 diabetes mellitus with unspecified complications: Principal | ICD-10-CM

## 2018-04-26 DIAGNOSIS — E1142 Type 2 diabetes mellitus with diabetic polyneuropathy: Secondary | ICD-10-CM

## 2018-05-10 NOTE — Telephone Encounter (Signed)
Sent mychart message

## 2018-07-08 ENCOUNTER — Other Ambulatory Visit: Payer: Self-pay | Admitting: Family Medicine

## 2018-07-08 ENCOUNTER — Telehealth: Payer: Self-pay

## 2018-07-08 DIAGNOSIS — E1165 Type 2 diabetes mellitus with hyperglycemia: Secondary | ICD-10-CM

## 2018-07-08 MED ORDER — METFORMIN HCL 850 MG PO TABS: 850.0000 mg | ORAL_TABLET | Freq: Two times a day (BID) | ORAL | 2 refills | Status: DC

## 2018-07-08 NOTE — Telephone Encounter (Signed)
Copied from [place] [phone]. Topic: General - Other >> Jul 08, 2018 12:43 PM Leward Quan A wrote: Reason for CRM: Patient called to report to Dr Kathrine Haddock that she was notified by her pharmacy of the recall she is asking for a new Rx sent to pharmacy to replace this medication. Please call patient at Ph# [phone]

## 2018-07-08 NOTE — Telephone Encounter (Signed)
What labs are you wanting rechecked in 3 months?

## 2018-07-08 NOTE — Telephone Encounter (Signed)
Change to Immed release 850 mg bid #60  2 refills Recheck labs 3 months

## 2018-07-29 ENCOUNTER — Other Ambulatory Visit: Payer: Self-pay | Admitting: Family Medicine

## 2018-08-03 ENCOUNTER — Other Ambulatory Visit: Payer: Self-pay | Admitting: Family Medicine

## 2018-08-03 DIAGNOSIS — E1142 Type 2 diabetes mellitus with diabetic polyneuropathy: Secondary | ICD-10-CM

## 2018-08-03 DIAGNOSIS — E1165 Type 2 diabetes mellitus with hyperglycemia: Secondary | ICD-10-CM

## 2018-08-03 DIAGNOSIS — IMO0002 Reserved for concepts with insufficient information to code with codable children: Secondary | ICD-10-CM

## 2018-09-13 ENCOUNTER — Other Ambulatory Visit: Payer: Self-pay

## 2018-09-13 ENCOUNTER — Ambulatory Visit (INDEPENDENT_AMBULATORY_CARE_PROVIDER_SITE_OTHER): Payer: Self-pay | Admitting: Family Medicine

## 2018-09-13 ENCOUNTER — Telehealth: Payer: Self-pay | Admitting: Family Medicine

## 2018-09-13 ENCOUNTER — Encounter: Payer: Self-pay | Admitting: Family Medicine

## 2018-09-13 DIAGNOSIS — IMO0002 Reserved for concepts with insufficient information to code with codable children: Secondary | ICD-10-CM

## 2018-09-13 DIAGNOSIS — E1169 Type 2 diabetes mellitus with other specified complication: Secondary | ICD-10-CM

## 2018-09-13 DIAGNOSIS — E1165 Type 2 diabetes mellitus with hyperglycemia: Secondary | ICD-10-CM

## 2018-09-13 DIAGNOSIS — E118 Type 2 diabetes mellitus with unspecified complications: Secondary | ICD-10-CM

## 2018-09-13 DIAGNOSIS — E1142 Type 2 diabetes mellitus with diabetic polyneuropathy: Secondary | ICD-10-CM

## 2018-09-13 DIAGNOSIS — I10 Essential (primary) hypertension: Secondary | ICD-10-CM

## 2018-09-13 DIAGNOSIS — E785 Hyperlipidemia, unspecified: Secondary | ICD-10-CM

## 2018-09-13 DIAGNOSIS — Z79899 Other long term (current) drug therapy: Secondary | ICD-10-CM

## 2018-09-13 DIAGNOSIS — F411 Generalized anxiety disorder: Secondary | ICD-10-CM

## 2018-09-13 MED ORDER — METFORMIN HCL 850 MG PO TABS: 850.0000 mg | ORAL_TABLET | Freq: Two times a day (BID) | ORAL | 2 refills | Status: DC

## 2018-09-13 MED ORDER — DULOXETINE HCL 60 MG PO CPEP: ORAL_CAPSULE | ORAL | 1 refills | Status: DC

## 2018-09-13 MED ORDER — GLIMEPIRIDE 4 MG PO TABS: ORAL_TABLET | ORAL | 1 refills | Status: DC

## 2018-09-13 MED ORDER — LOSARTAN POTASSIUM 25 MG PO TABS: 25.0000 mg | ORAL_TABLET | Freq: Every day | ORAL | 2 refills | Status: DC

## 2018-09-13 MED ORDER — ALPRAZOLAM 0.25 MG PO TABS: 0.2500 mg | ORAL_TABLET | Freq: Three times a day (TID) | ORAL | 1 refills | Status: DC | PRN

## 2018-09-13 NOTE — Progress Notes (Signed)
Virtual Visit via Video Note  I connected with Cheryl Hart on 09/13/18 at 11:00 AM EDT by a video enabled telemedicine application and verified that I am speaking with the correct person using two identifiers.  Location: Patient: home Provider: office    I discussed the limitations of evaluation and management by telemedicine and the availability of in person appointments. The patient expressed understanding and agreed to proceed.  History of Present Illness: Pt is home   HYPERTENSION   Blood pressure range-good per pt   Chest pain- no      Dyspnea- no Lightheadedness- no   Edema- no  Other side effects - no   Medication compliance: good Low salt diet- yes    DIABETES    Blood Sugar ranges-not checking   Polyuria- no New Visual problems- no  Hypoglycemic symptoms- no  Other side effects-no Medication compliance - good Last eye exam- due    HYPERLIPIDEMIA  Medication compliance- good RUQ pain- no  Muscle aches- no Other side effects-no       Observations/Objective: 132/96   106  t 98.4  195 lbs  Pt is in NAD  Assessment and Plan: 1. Essential hypertension Well controlled, no changes to meds. Encouraged heart healthy diet such as the DASH diet and exercise as tolerated.   - Lipid panel; Future - Hemoglobin A1c; Future - Comprehensive metabolic panel; Future - Microalbumin / creatinine urine ratio; Future - losartan (COZAAR) 25 MG tablet; Take 1 tablet (25 mg total) by mouth daily.  Dispense: 30 tablet; Refill: 2  2. Hyperlipidemia associated with type 2 diabetes mellitus ([place]) Encouraged heart healthy diet, increase exercise, avoid trans fats, consider a krill oil cap daily - Lipid panel; Future - Hemoglobin A1c; Future - Comprehensive metabolic panel; Future - Microalbumin / creatinine urine ratio; Future  3. Uncontrolled type 2 diabetes mellitus with hyperglycemia (HCC) Check labs  - Lipid panel; Future - Hemoglobin A1c; Future - Comprehensive  metabolic panel; Future - Microalbumin / creatinine urine ratio; Future - glimepiride (AMARYL) 4 MG tablet; TAKE 1 TABLET BY MOUTH ONCE DAILY WITH BREAKFAST **NEEDS  OV  BEFORE  ANY  MORE  REFILLS**  Dispense: 90 tablet; Refill: 1 - metFORMIN (GLUCOPHAGE) 850 MG tablet; Take 1 tablet (850 mg total) by mouth 2 (two) times daily with a meal.  Dispense: 60 tablet; Refill: 2  4. High risk medication use  - Pain Mgmt, Profile 8 w/Conf, U; Future  5. Generalized anxiety disorder Stable con't meds  - ALPRAZolam (XANAX) 0.25 MG tablet; Take 1 tablet (0.25 mg total) by mouth 3 (three) times daily as needed.  Dispense: 30 tablet; Refill: 1  6. Uncontrolled type 2 diabetes mellitus with complication, without long-term current use of insulin ([place]) hgba1c to be checked , minimize simple carbs. Increase exercise as tolerated. Continue current meds  - DULoxetine (CYMBALTA) 60 MG capsule; TAKE 1 CAPSULE BY MOUTH ONCE DAILY **NEEDS  OV  BEFORE  ANY  MORE  REFILLS  Dispense: 90 capsule; Refill: 1  7. Diabetic polyneuropathy associated with type 2 diabetes mellitus (HCC)   - DULoxetine (CYMBALTA) 60 MG capsule; TAKE 1 CAPSULE BY MOUTH ONCE DAILY **NEEDS  OV  BEFORE  ANY  MORE  REFILLS  Dispense: 90 capsule; Refill: 1  Follow Up Instructions:    I discussed the assessment and treatment plan with the patient. The patient was provided an opportunity to ask questions and all were answered. The patient agreed with the plan and demonstrated an understanding of the  instructions.   The patient was advised to call back or seek an in-person evaluation if the symptoms worsen or if the condition fails to improve as anticipated.  I provided 25 minutes of non-face-to-face time during this encounter.   Ann Held, DO

## 2018-09-13 NOTE — Telephone Encounter (Signed)
LVM to schedule " in office in 2 weeks f/u bp , flu shot and labs" DOS 09/13/18

## 2018-09-26 ENCOUNTER — Encounter: Payer: Self-pay | Admitting: Family Medicine

## 2018-09-28 ENCOUNTER — Ambulatory Visit: Payer: Self-pay | Admitting: Family Medicine

## 2018-10-01 ENCOUNTER — Encounter: Payer: Self-pay | Admitting: Family Medicine

## 2018-10-01 ENCOUNTER — Other Ambulatory Visit: Payer: Self-pay

## 2018-10-01 ENCOUNTER — Telehealth: Payer: Self-pay | Admitting: Family Medicine

## 2018-10-01 ENCOUNTER — Ambulatory Visit (INDEPENDENT_AMBULATORY_CARE_PROVIDER_SITE_OTHER): Payer: Self-pay | Admitting: Family Medicine

## 2018-10-01 DIAGNOSIS — E1165 Type 2 diabetes mellitus with hyperglycemia: Secondary | ICD-10-CM

## 2018-10-01 DIAGNOSIS — G8929 Other chronic pain: Secondary | ICD-10-CM

## 2018-10-01 DIAGNOSIS — E118 Type 2 diabetes mellitus with unspecified complications: Secondary | ICD-10-CM

## 2018-10-01 DIAGNOSIS — M545 Low back pain, unspecified: Secondary | ICD-10-CM

## 2018-10-01 DIAGNOSIS — I10 Essential (primary) hypertension: Secondary | ICD-10-CM

## 2018-10-01 DIAGNOSIS — IMO0002 Reserved for concepts with insufficient information to code with codable children: Secondary | ICD-10-CM

## 2018-10-01 DIAGNOSIS — Z23 Encounter for immunization: Secondary | ICD-10-CM

## 2018-10-01 DIAGNOSIS — E785 Hyperlipidemia, unspecified: Secondary | ICD-10-CM

## 2018-10-01 DIAGNOSIS — E1169 Type 2 diabetes mellitus with other specified complication: Secondary | ICD-10-CM

## 2018-10-01 LAB — MICROALBUMIN / CREATININE URINE RATIO
Creatinine,U: 63 mg/dL
Microalb Creat Ratio: 1.1 mg/g (ref 0.0–30.0)
Microalb, Ur: <0.7 mg/dL (ref 0.0–1.9)

## 2018-10-01 LAB — COMPREHENSIVE METABOLIC PANEL
ALT: 43 U/L — ABNORMAL HIGH (ref 0–35)
AST: 25 U/L (ref 0–37)
Albumin: 3.8 g/dL (ref 3.5–5.2)
Alkaline Phosphatase: 127 U/L — ABNORMAL HIGH (ref 39–117)
BUN: 7 mg/dL (ref 6–23)
CO2: 25 mEq/L (ref 19–32)
Calcium: 9.9 mg/dL (ref 8.4–10.5)
Chloride: 96 mEq/L (ref 96–112)
Creatinine, Ser: 0.58 mg/dL (ref 0.40–1.20)
GFR: 135.77 mL/min (ref >60.00)
Glucose, Bld: 279 mg/dL — ABNORMAL HIGH (ref 70–99)
Potassium: 4 mEq/L (ref 3.5–5.1)
Sodium: 131 mEq/L — ABNORMAL LOW (ref 135–145)
Total Bilirubin: 0.2 mg/dL (ref 0.2–1.2)
Total Protein: 7.5 g/dL (ref 6.0–8.3)

## 2018-10-01 LAB — LIPID PANEL
Cholesterol: 189 mg/dL (ref 0–200)
HDL: 37.3 mg/dL — ABNORMAL LOW (ref >39.00)
LDL Cholesterol: 113 mg/dL — ABNORMAL HIGH (ref 0–99)
NonHDL: 151.68
Total CHOL/HDL Ratio: 5
Triglycerides: 192 mg/dL — ABNORMAL HIGH (ref 0.0–149.0)
VLDL: 38.4 mg/dL (ref 0.0–40.0)

## 2018-10-01 LAB — HEMOGLOBIN A1C: Hgb A1c MFr Bld: 10.9 % — ABNORMAL HIGH (ref 4.6–6.5)

## 2018-10-01 MED ORDER — IBUPROFEN 600 MG PO TABS: 600.0000 mg | ORAL_TABLET | Freq: Four times a day (QID) | ORAL | 2 refills | Status: DC | PRN

## 2018-10-01 NOTE — Progress Notes (Signed)
Patient ID: Cheryl Hart, female    DOB: 10-09-1975  Age: 43 y.o. MRN: [id number]    Subjective:  Subjective  HPI Tennelle Taflinger presents for f/u dm, chol and bp HYPERTENSION   Blood pressure range-not checking   Chest pain- no      Dyspnea- no Lightheadedness- no   Edema- no  Other side effects - no   Medication compliance: good Low salt diet- yes    DIABETES    Blood Sugar ranges-170s fasting   Polyuria- no New Visual problems- no  Hypoglycemic symptoms- no  Other side effects-no Medication compliance - good Last eye exam- due last2019 Foot exam- today   HYPERLIPIDEMIA  Medication compliance- good RUQ pain- no  Muscle aches- no Other side effects-no    Review of Systems  Constitutional: Negative for chills and fever.  HENT: Negative for congestion and hearing loss.   Eyes: Negative for discharge.  Respiratory: Negative for cough and shortness of breath.   Cardiovascular: Negative for chest pain, palpitations and leg swelling.  Gastrointestinal: Negative for abdominal pain, blood in stool, constipation, diarrhea, nausea and vomiting.  Genitourinary: Negative for dysuria, frequency, hematuria and urgency.  Musculoskeletal: Negative for back pain and myalgias.  Skin: Negative for rash.  Allergic/Immunologic: Negative for environmental allergies.  Neurological: Negative for dizziness, weakness and headaches.  Hematological: Does not bruise/bleed easily.  Psychiatric/Behavioral: Negative for suicidal ideas. The patient is not nervous/anxious.     History Past Medical History:  Diagnosis Date  . Anemia   . Anxiety   . Diabetes mellitus   . Hyperlipidemia   . Uterine fibroid     She has a past surgical history that includes Cesarean section; g1 p1; Cholecystectomy; and Laparoscopic gelport assisted myomectomy (02/12/2015).   Her family history includes Diabetes in her brother, father, and mother; Migraines in her unknown relative.She reports that she has never  smoked. She has never used smokeless tobacco. She reports that she does not drink alcohol or use drugs.  Current Outpatient Medications on File Prior to Visit  Medication Sig Dispense Refill  . ALPRAZolam (XANAX) 0.25 MG tablet Take 1 tablet (0.25 mg total) by mouth 3 (three) times daily as needed. 30 tablet 1  . amoxicillin-clavulanate (AUGMENTIN) 875-125 MG tablet Take 1 tablet by mouth 2 (two) times daily. 20 tablet 0  . Biotin 5000 MCG TABS Take 5,000 mcg by mouth daily.     . blood glucose meter kit and supplies KIT Dispense based on patient and insurance preference. Use up to four times daily as directed. (FOR ICD-9 250.00, 250.01). 1 each 0  . diphenhydrAMINE (BENADRYL) 25 MG tablet Take 50 mg by mouth at bedtime as needed for sleep.     . DULoxetine (CYMBALTA) 60 MG capsule TAKE 1 CAPSULE BY MOUTH ONCE DAILY **NEEDS  OV  BEFORE  ANY  MORE  REFILLS 90 capsule 1  . fluticasone (FLONASE) 50 MCG/ACT nasal spray Place 2 sprays into both nostrils daily. 16 g 6  . glimepiride (AMARYL) 4 MG tablet TAKE 1 TABLET BY MOUTH ONCE DAILY WITH BREAKFAST **NEEDS  OV  BEFORE  ANY  MORE  REFILLS** 90 tablet 1  . losartan (COZAAR) 25 MG tablet Take 1 tablet (25 mg total) by mouth daily. 30 tablet 2  . metFORMIN (GLUCOPHAGE) 850 MG tablet Take 1 tablet (850 mg total) by mouth 2 (two) times daily with a meal. 60 tablet 2  . Multiple Vitamin (MULTIVITAMIN WITH MINERALS) TABS tablet Take 1 tablet by mouth daily.    Marland Kitchen  nystatin (NYSTATIN) powder Apply topically 4 (four) times daily. 60 g 0   No current facility-administered medications on file prior to visit.      Objective:  Objective  Physical Exam Vitals signs reviewed.  Constitutional:      Appearance: She is well-developed.  HENT:     Head: Normocephalic and atraumatic.  Eyes:     Conjunctiva/sclera: Conjunctivae normal.  Neck:     Musculoskeletal: Normal range of motion and neck supple.     Thyroid: No thyromegaly.     Vascular: No carotid bruit  or JVD.  Cardiovascular:     Rate and Rhythm: Normal rate and regular rhythm.     Heart sounds: Normal heart sounds. No murmur.  Pulmonary:     Effort: Pulmonary effort is normal. No respiratory distress.     Breath sounds: Normal breath sounds. No wheezing or rales.  Chest:     Chest wall: No tenderness.  Neurological:     Mental Status: She is alert and oriented to person, place, and time.    BP 134/82 (BP Location: Right Arm, Patient Position: Sitting, Cuff Size: Normal)   Pulse (!) 109   Temp (!) 97.2 F (36.2 C) (Temporal)   Resp 18   Ht 5' 8"  (1.727 m)   Wt 205 lb 3.2 oz (93.1 kg)   SpO2 97%   BMI 31.20 kg/m  Wt Readings from Last 3 Encounters:  10/01/18 205 lb 3.2 oz (93.1 kg)  01/22/18 199 lb 12.8 oz (90.6 kg)  07/10/17 201 lb (91.2 kg)     Lab Results  Component Value Date   WBC 7.0 07/10/2017   HGB 10.7 (L) 07/10/2017   HCT 32.7 (L) 07/10/2017   PLT 394 07/10/2017   GLUCOSE 279 (H) 10/01/2018   CHOL 189 10/01/2018   TRIG 192.0 (H) 10/01/2018   HDL 37.30 (L) 10/01/2018   LDLDIRECT 92.0 02/01/2015   LDLCALC 113 (H) 10/01/2018   ALT 43 (H) 10/01/2018   AST 25 10/01/2018   NA 131 (L) 10/01/2018   K 4.0 10/01/2018   CL 96 10/01/2018   CREATININE 0.58 10/01/2018   BUN 7 10/01/2018   CO2 25 10/01/2018   TSH 1.53 02/09/2017   INR 1.12 02/12/2015   HGBA1C 10.9 (H) 10/01/2018   MICROALBUR <0.7 10/01/2018    Ct Renal Stone Study  Result Date: 07/10/2017 CLINICAL DATA:  Left flank pain x2 days. History of nephrolithiasis. EXAM: CT ABDOMEN AND PELVIS WITHOUT CONTRAST TECHNIQUE: Multidetector CT imaging of the abdomen and pelvis was performed following the standard protocol without IV contrast. COMPARISON:  02/23/2007 FINDINGS: Lower chest: No acute abnormality. Hepatobiliary: Fatty infiltration of the liver. Cholecystectomy clips. Pancreas: Unremarkable. No pancreatic ductal dilatation or surrounding inflammatory changes. Spleen: Normal in size without focal  abnormality. Adrenals/Urinary Tract: Normal adrenal glands. No nephrolithiasis or hydronephrosis. Ureters decompressed. Urinary bladder incompletely distended. Stomach/Bowel: Stomach and small bowel are nondilated. Normal appendix. Moderate proximal colonic fecal material, decompressed distally. No focal wall thickening or regional inflammatory/edematous change. Vascular/Lymphatic: No significant vascular findings are present. No enlarged abdominal or pelvic lymph nodes. Reproductive: Partially calcified infarcted uterine fibroids. No adnexal mass. Other: No ascites.  No free air. Musculoskeletal: Mild disc narrowing L4-5 with anterior spurring. No fracture or worrisome bone lesion. IMPRESSION: 1. No acute abdominal findings.  No urolithiasis or hydronephrosis. Electronically Signed   By: Lucrezia Europe M.D.   On: 07/10/2017 12:42     Assessment & Plan:  Plan  I am having Gloris Ham  maintain her multivitamin with minerals, Biotin, diphenhydrAMINE, blood glucose meter kit and supplies, nystatin, amoxicillin-clavulanate, fluticasone, losartan, ALPRAZolam, DULoxetine, glimepiride, metFORMIN, and ibuprofen.  Meds ordered this encounter  Medications  . DISCONTD: ibuprofen (ADVIL) 600 MG tablet    Sig: Take 1 tablet (600 mg total) by mouth every 6 (six) hours as needed.    Dispense:  30 tablet    Refill:  2  . ibuprofen (ADVIL) 600 MG tablet    Sig: Take 1 tablet (600 mg total) by mouth every 6 (six) hours as needed.    Dispense:  30 tablet    Refill:  2    Problem List Items Addressed This Visit      Unprioritized   HTN (hypertension)    Well controlled, no changes to meds. Encouraged heart healthy diet such as the DASH diet and exercise as tolerated.       Relevant Orders   Lipid panel (Completed)   Hemoglobin A1c (Completed)   Comprehensive metabolic panel (Completed)   Microalbumin / creatinine urine ratio (Completed)   Hyperlipidemia    Tolerating statin, encouraged heart healthy diet,  avoid trans fats, minimize simple carbs and saturated fats. Increase exercise as tolerated      MORBID OBESITY - Primary   Relevant Orders   Amb Ref to Medical Weight Management   Uncontrolled type 2 diabetes mellitus with complication, without long-term current use of insulin ([place])    hgba1c to be checked, minimize simple carbs. Increase exercise as tolerated. Continue current meds        Other Visit Diagnoses    Hyperlipidemia associated with type 2 diabetes mellitus ([place])       Relevant Orders   Lipid panel (Completed)   Hemoglobin A1c (Completed)   Comprehensive metabolic panel (Completed)   Microalbumin / creatinine urine ratio (Completed)   Uncontrolled type 2 diabetes mellitus with hyperglycemia (HCC)       Relevant Orders   Hemoglobin A1c (Completed)   Comprehensive metabolic panel (Completed)   Microalbumin / creatinine urine ratio (Completed)   Need for influenza vaccination       Relevant Orders   Flu Vaccine QUAD 36+ mos IM (Completed)   Chronic low back pain, unspecified back pain laterality, unspecified whether sciatica present       Relevant Medications   ibuprofen (ADVIL) 600 MG tablet      Follow-up: Return in about 6 months (around 03/31/2019), or if symptoms worsen or fail to improve, for annual exam, fasting.  Ann Held, DO

## 2018-10-01 NOTE — Telephone Encounter (Signed)
Patient was seen today and states she forgot to ask PCP for ibuprofen prescription strength for back pain, please advise    [place], [place] [phone] (Phone) [phone] (Fax)

## 2018-10-01 NOTE — Telephone Encounter (Signed)
Please advise 

## 2018-10-01 NOTE — Patient Instructions (Signed)
Carbohydrate Counting for Diabetes Mellitus, Adult  Carbohydrate counting is a method of keeping track of how many carbohydrates you eat. Eating carbohydrates naturally increases the amount of sugar (glucose) in the blood. Counting how many carbohydrates you eat helps keep your blood glucose within normal limits, which helps you manage your diabetes (diabetes mellitus). It is important to know how many carbohydrates you can safely have in each meal. This is different for every person. A diet and nutrition specialist (registered dietitian) can help you make a meal plan and calculate how many carbohydrates you should have at each meal and snack. Carbohydrates are found in the following foods:  Grains, such as breads and cereals.  Dried beans and soy products.  Starchy vegetables, such as potatoes, peas, and corn.  Fruit and fruit juices.  Milk and yogurt.  Sweets and snack foods, such as cake, cookies, candy, chips, and soft drinks. How do I count carbohydrates? There are two ways to count carbohydrates in food. You can use either of the methods or a combination of both. Reading "Nutrition Facts" on packaged food The "Nutrition Facts" list is included on the labels of almost all packaged foods and beverages in the [place] It includes:  The serving size.  Information about nutrients in each serving, including the grams (g) of carbohydrate per serving. To use the "Nutrition Facts":  Decide how many servings you will have.  Multiply the number of servings by the number of carbohydrates per serving.  The resulting number is the total amount of carbohydrates that you will be having. Learning standard serving sizes of other foods When you eat carbohydrate foods that are not packaged or do not include "Nutrition Facts" on the label, you need to measure the servings in order to count the amount of carbohydrates:  Measure the foods that you will eat with a food scale or measuring cup, if needed.   Decide how many standard-size servings you will eat.  Multiply the number of servings by 15. Most carbohydrate-rich foods have about 15 g of carbohydrates per serving. ? For example, if you eat 8 oz (170 g) of strawberries, you will have eaten 2 servings and 30 g of carbohydrates (2 servings x 15 g = 30 g).  For foods that have more than one food mixed, such as soups and casseroles, you must count the carbohydrates in each food that is included. The following list contains standard serving sizes of common carbohydrate-rich foods. Each of these servings has about 15 g of carbohydrates:   hamburger bun or  English muffin.   oz (15 mL) syrup.   oz (14 g) jelly.  1 slice of bread.  1 six-inch tortilla.  3 oz (85 g) cooked rice or pasta.  4 oz (113 g) cooked dried beans.  4 oz (113 g) starchy vegetable, such as peas, corn, or potatoes.  4 oz (113 g) hot cereal.  4 oz (113 g) mashed potatoes or  of a large baked potato.  4 oz (113 g) canned or frozen fruit.  4 oz (120 mL) fruit juice.  4-6 crackers.  6 chicken nuggets.  6 oz (170 g) unsweetened dry cereal.  6 oz (170 g) plain fat-free yogurt or yogurt sweetened with artificial sweeteners.  8 oz (240 mL) milk.  8 oz (170 g) fresh fruit or one small piece of fruit.  24 oz (680 g) popped popcorn. Example of carbohydrate counting Sample meal  3 oz (85 g) chicken breast.  6 oz (170 g)   brown rice.  4 oz (113 g) corn.  8 oz (240 mL) milk.  8 oz (170 g) strawberries with sugar-free whipped topping. Carbohydrate calculation 1. Identify the foods that contain carbohydrates: ? Rice. ? Corn. ? Milk. ? Strawberries. 2. Calculate how many servings you have of each food: ? 2 servings rice. ? 1 serving corn. ? 1 serving milk. ? 1 serving strawberries. 3. Multiply each number of servings by 15 g: ? 2 servings rice x 15 g = 30 g. ? 1 serving corn x 15 g = 15 g. ? 1 serving milk x 15 g = 15 g. ? 1 serving  strawberries x 15 g = 15 g. 4. Add together all of the amounts to find the total grams of carbohydrates eaten: ? 30 g + 15 g + 15 g + 15 g = 75 g of carbohydrates total. Summary  Carbohydrate counting is a method of keeping track of how many carbohydrates you eat.  Eating carbohydrates naturally increases the amount of sugar (glucose) in the blood.  Counting how many carbohydrates you eat helps keep your blood glucose within normal limits, which helps you manage your diabetes.  A diet and nutrition specialist (registered dietitian) can help you make a meal plan and calculate how many carbohydrates you should have at each meal and snack. This information is not intended to replace advice given to you by your health care provider. Make sure you discuss any questions you have with your health care provider. Document Released: 12/30/2004 Document Revised: 07/24/2016 Document Reviewed: 06/13/2015 Elsevier Patient Education  2020 [employer]  

## 2018-10-01 NOTE — Assessment & Plan Note (Signed)
Tolerating statin, encouraged heart healthy diet, avoid trans fats, minimize simple carbs and saturated fats. Increase exercise as tolerated 

## 2018-10-01 NOTE — Assessment & Plan Note (Signed)
Well controlled, no changes to meds. Encouraged heart healthy diet such as the DASH diet and exercise as tolerated.  °

## 2018-10-01 NOTE — Telephone Encounter (Signed)
done

## 2018-10-01 NOTE — Assessment & Plan Note (Signed)
hgba1c to be checked, minimize simple carbs. Increase exercise as tolerated. Continue current meds  

## 2018-10-06 ENCOUNTER — Other Ambulatory Visit: Payer: Self-pay | Admitting: Family Medicine

## 2018-10-06 DIAGNOSIS — B354 Tinea corporis: Secondary | ICD-10-CM

## 2018-10-06 MED ORDER — NYSTATIN 100000 UNIT/GM EX POWD: Freq: Four times a day (QID) | CUTANEOUS | 0 refills | Status: DC

## 2018-10-08 ENCOUNTER — Other Ambulatory Visit: Payer: Self-pay

## 2018-10-08 ENCOUNTER — Other Ambulatory Visit: Payer: Self-pay | Admitting: Family Medicine

## 2018-10-08 DIAGNOSIS — E1169 Type 2 diabetes mellitus with other specified complication: Secondary | ICD-10-CM

## 2018-10-08 DIAGNOSIS — E1165 Type 2 diabetes mellitus with hyperglycemia: Secondary | ICD-10-CM

## 2018-10-08 DIAGNOSIS — E785 Hyperlipidemia, unspecified: Secondary | ICD-10-CM

## 2018-10-08 MED ORDER — SIMVASTATIN 40 MG PO TABS: 40.0000 mg | ORAL_TABLET | Freq: Every day | ORAL | 2 refills | Status: DC

## 2018-10-08 MED ORDER — GLIMEPIRIDE 4 MG PO TABS: 4.0000 mg | ORAL_TABLET | Freq: Two times a day (BID) | ORAL | 2 refills | Status: DC

## 2018-10-11 ENCOUNTER — Telehealth: Payer: Self-pay | Admitting: Family Medicine

## 2018-10-11 NOTE — Telephone Encounter (Signed)
Patient had OV 10/01/2018. She did not have the correct insurance information. Patient is calling to report the correct insurance information. If was billed can it be refilled please?  [place] number [id number] Group number [id number]

## 2018-10-22 ENCOUNTER — Encounter: Payer: Self-pay | Admitting: Internal Medicine

## 2018-10-22 NOTE — Progress Notes (Deleted)
Name: Cheryl Hart  MRN/ DOB: [id number], 06/17/1975   Age/ Sex: 43 y.o., female    PCP: Carollee Herter, Alferd Apa, DO   Reason for Endocrinology Evaluation: Type 2 Diabetes Mellitus     Date of Initial Endocrinology Visit: 10/22/2018     PATIENT IDENTIFIER: Cheryl Hart is a 43 y.o. female with a past medical history of ***. The patient presented for initial endocrinology clinic visit on 10/22/2018 for consultative assistance with her diabetes management.    HPI: Ms. Fogal was    Diagnosed with DM *** Prior Medications tried/Intolerance: Invokana , victoza, januvia  Currently checking blood sugars *** x / day,  before breakfast and ***.  Hypoglycemia episodes : ***               Symptoms: ***                 Frequency: ***/  Hemoglobin A1c has ranged from 7.4%in 2018, peaking at 11.1% in 2019. Patient required assistance for hypoglycemia:  Patient has required hospitalization within the last 1 year from hyper or hypoglycemia:   In terms of diet, the patient ***   HOME DIABETES REGIMEN: Metformin 850 mg  Glimepiride 4 mg    Statin: yes ACE-I/ARB: yes Prior Diabetic Education: {Yes/No:11203}   METER DOWNLOAD SUMMARY: Date range evaluated: *** Fingerstick Blood Glucose Tests = *** Average Number Tests/Day = *** Overall Mean FS Glucose = *** Standard Deviation = ***  BG Ranges: Low = *** High = ***   Hypoglycemic Events/30 Days: BG < 50 = *** Episodes of symptomatic severe hypoglycemia = ***   DIABETIC COMPLICATIONS: Microvascular complications:   ***  Denies: ***  Last eye exam: Completed   Macrovascular complications:   ***  Denies: CAD, PVD, CVA   PAST HISTORY: Past Medical History:  Past Medical History:  Diagnosis Date  . Anemia   . Anxiety   . Diabetes mellitus   . Hyperlipidemia   . Uterine fibroid     Past Surgical History:  Past Surgical History:  Procedure Laterality Date  . CESAREAN SECTION    . CHOLECYSTECTOMY    . g1 p1     . LAPAROSCOPIC GELPORT ASSISTED MYOMECTOMY  02/12/2015   Dr Barbie Banner      Social History:  reports that she has never smoked. She has never used smokeless tobacco. She reports that she does not drink alcohol or use drugs. Family History:  Family History  Problem Relation Age of Onset  . Migraines Unknown   . Diabetes Mother   . Diabetes Father   . Diabetes Brother       HOME MEDICATIONS: Allergies as of 10/25/2018      Reactions   Sulfonamide Derivatives Photosensitivity, Rash, Other (See Comments)   Severe headache      Medication List       Accurate as of October 22, 2018 12:38 PM. If you have any questions, ask your nurse or doctor.        ALPRAZolam 0.25 MG tablet Commonly known as: XANAX Take 1 tablet (0.25 mg total) by mouth 3 (three) times daily as needed.   amoxicillin-clavulanate 875-125 MG tablet Commonly known as: Augmentin Take 1 tablet by mouth 2 (two) times daily.   Biotin 5000 MCG Tabs Take 5,000 mcg by mouth daily.   blood glucose meter kit and supplies Kit Dispense based on patient and insurance preference. Use up to four times daily as directed. (FOR ICD-9 250.00, 250.01).   diphenhydrAMINE  25 MG tablet Commonly known as: BENADRYL Take 50 mg by mouth at bedtime as needed for sleep.   DULoxetine 60 MG capsule Commonly known as: CYMBALTA TAKE 1 CAPSULE BY MOUTH ONCE DAILY **NEEDS  OV  BEFORE  ANY  MORE  REFILLS   fluticasone 50 MCG/ACT nasal spray Commonly known as: FLONASE Place 2 sprays into both nostrils daily.   glimepiride 4 MG tablet Commonly known as: AMARYL Take 1 tablet (4 mg total) by mouth 2 (two) times daily.   ibuprofen 600 MG tablet Commonly known as: ADVIL Take 1 tablet (600 mg total) by mouth every 6 (six) hours as needed.   losartan 25 MG tablet Commonly known as: Cozaar Take 1 tablet (25 mg total) by mouth daily.   metFORMIN 850 MG tablet Commonly known as: GLUCOPHAGE Take 1 tablet (850 mg total) by mouth 2 (two)  times daily with a meal.   multivitamin with minerals Tabs tablet Take 1 tablet by mouth daily.   nystatin powder Commonly known as: nystatin Apply topically 4 (four) times daily.   simvastatin 40 MG tablet Commonly known as: ZOCOR Take 1 tablet (40 mg total) by mouth at bedtime.        ALLERGIES: Allergies  Allergen Reactions  . Sulfonamide Derivatives Photosensitivity, Rash and Other (See Comments)    Severe headache     REVIEW OF SYSTEMS: A comprehensive ROS was conducted with the patient and is negative except as per HPI and below:  ROS    OBJECTIVE:   VITAL SIGNS: There were no vitals taken for this visit.   PHYSICAL EXAM:  General: Pt appears well and is in NAD  Hydration: Well-hydrated with moist mucous membranes and good skin turgor  HEENT: Head: Unremarkable with good dentition. Oropharynx clear without exudate.  Eyes: External eye exam normal without stare, lid lag or exophthalmos.  EOM intact.  PERRL.  Neck: General: Supple without adenopathy or carotid bruits. Thyroid: Thyroid size normal.  No goiter or nodules appreciated. No thyroid bruit.  Lungs: Clear with good BS bilat with no rales, rhonchi, or wheezes  Heart: RRR with normal S1 and S2 and no gallops; no murmurs; no rub  Abdomen: Normoactive bowel sounds, soft, nontender, without masses or organomegaly palpable  Extremities:  Lower extremities - No pretibial edema. No lesions.  Skin: Normal texture and temperature to palpation. No rash noted. No Acanthosis nigricans/skin tags. No lipohypertrophy.  Neuro: MS is good with appropriate affect, pt is alert and Ox3    DM foot exam:    DATA REVIEWED:  Lab Results  Component Value Date   HGBA1C 10.9 (H) 10/01/2018   HGBA1C 11.1 (H) 02/09/2017   HGBA1C 7.4 (H) 02/08/2016   Lab Results  Component Value Date   MICROALBUR <0.7 10/01/2018   LDLCALC 113 (H) 10/01/2018   CREATININE 0.58 10/01/2018   Lab Results  Component Value Date    MICRALBCREAT 1.1 10/01/2018    Lab Results  Component Value Date   CHOL 189 10/01/2018   HDL 37.30 (L) 10/01/2018   LDLCALC 113 (H) 10/01/2018   LDLDIRECT 92.0 02/01/2015   TRIG 192.0 (H) 10/01/2018   CHOLHDL 5 10/01/2018        ASSESSMENT / PLAN / RECOMMENDATIONS:   1) Type *** Diabetes Mellitus, ***controlled, With*** complications - Most recent A1c of *** %. Goal A1c < *** %.  ***  Plan: GENERAL:  ***  MEDICATIONS:  ***  EDUCATION / INSTRUCTIONS:  BG monitoring instructions: Patient is instructed to check  her blood sugars *** times a day, ***.  Call [place] [facility] if: BG persistently < 70 or > 300. . I reviewed the Rule of 15 for the treatment of hypoglycemia in detail with the patient. Literature supplied.   2) Diabetic complications:   Eye: Does *** have known diabetic retinopathy.   Neuro/ Feet: Does *** have known diabetic peripheral neuropathy.  Renal: Patient does not have known baseline CKD. She is *** on an ACEI/ARB at present.Check urine albumin/creatinine ratio yearly starting at time of diagnosis. If albuminuria is positive, treatment is geared toward better glucose, blood pressure control and use of ACE inhibitors or ARBs. Monitor electrolytes and creatinine once to twice yearly.   3) Dyslipidemia: Patient is *** on a statin. LDL 113 mg/dL    4) Hypertension: ***  at goal of < 140/90 mmHg.       Signed electronically by: Mack Guise, MD  [facility]  [facility] [address] [place], [place] [address] Phone: [phone] FAX: [phone]   CC: [address] [place] [address] Phone: [phone]  Fax: [phone]    Return to Endocrinology clinic as below: Future Appointments  Date Time Provider [place]  10/25/2018  7:50 AM Shamleffer, Melanie Crazier, MD [facility] None

## 2018-10-25 ENCOUNTER — Encounter: Payer: BC Managed Care – PPO | Admitting: Internal Medicine

## 2018-10-25 DIAGNOSIS — Z0289 Encounter for other administrative examinations: Secondary | ICD-10-CM

## 2018-10-29 ENCOUNTER — Encounter: Payer: Self-pay | Admitting: Internal Medicine

## 2018-11-03 ENCOUNTER — Other Ambulatory Visit: Payer: Self-pay

## 2018-11-03 DIAGNOSIS — Z20822 Contact with and (suspected) exposure to covid-19: Secondary | ICD-10-CM

## 2018-11-05 LAB — NOVEL CORONAVIRUS, NAA: SARS-CoV-2, NAA: NOT DETECTED

## 2018-11-10 ENCOUNTER — Other Ambulatory Visit: Payer: Self-pay | Admitting: Family Medicine

## 2018-11-10 DIAGNOSIS — F411 Generalized anxiety disorder: Secondary | ICD-10-CM

## 2018-11-10 NOTE — Telephone Encounter (Signed)
Requesting: Xanax  Contract: 02/10/2016 UDS: 02/10/2016 Last OV: 10/01/2018 Next OV: N/A Last Refill: 09/13/2018, #30--1 RF Database:   Please advise

## 2018-11-11 NOTE — Telephone Encounter (Signed)
Requesting: Xanax Contract: 02/10/2016 UDS: 02/10/2016 Last OV: 10/01/2018 Next OV: N/A Last Refill: 11/10/2018, #30--0 RF Database:   Please advise

## 2018-12-01 ENCOUNTER — Ambulatory Visit: Payer: BC Managed Care – PPO | Admitting: Internal Medicine

## 2018-12-01 ENCOUNTER — Other Ambulatory Visit: Payer: Self-pay

## 2018-12-01 ENCOUNTER — Telehealth: Payer: Self-pay

## 2018-12-01 ENCOUNTER — Encounter: Payer: Self-pay | Admitting: Internal Medicine

## 2018-12-01 VITALS — BP 122/82 | HR 110 | Ht 66.5 in | Wt 211.6 lb

## 2018-12-01 DIAGNOSIS — E1165 Type 2 diabetes mellitus with hyperglycemia: Secondary | ICD-10-CM | POA: Diagnosis not present

## 2018-12-01 DIAGNOSIS — E785 Hyperlipidemia, unspecified: Secondary | ICD-10-CM | POA: Diagnosis not present

## 2018-12-01 DIAGNOSIS — R000 Tachycardia, unspecified: Secondary | ICD-10-CM | POA: Insufficient documentation

## 2018-12-01 DIAGNOSIS — IMO0002 Reserved for concepts with insufficient information to code with codable children: Secondary | ICD-10-CM

## 2018-12-01 DIAGNOSIS — E118 Type 2 diabetes mellitus with unspecified complications: Secondary | ICD-10-CM | POA: Diagnosis not present

## 2018-12-01 LAB — POCT GLYCOSYLATED HEMOGLOBIN (HGB A1C): Hemoglobin A1C: 9 % — AB (ref 4.0–5.6)

## 2018-12-01 LAB — T4, FREE: Free T4: 0.8 ng/dL (ref 0.60–1.60)

## 2018-12-01 LAB — TSH: TSH: 1.84 u[IU]/mL (ref 0.35–4.50)

## 2018-12-01 LAB — GLUCOSE, POCT (MANUAL RESULT ENTRY): POC Glucose: 379 mg/dl — AB (ref 70–99)

## 2018-12-01 MED ORDER — GLUCOSE BLOOD VI STRP: ORAL_STRIP | 2 refills | Status: DC

## 2018-12-01 MED ORDER — GLIPIZIDE 10 MG PO TABS: 10.0000 mg | ORAL_TABLET | Freq: Two times a day (BID) | ORAL | 3 refills | Status: DC

## 2018-12-01 MED ORDER — ONETOUCH ULTRASOFT LANCETS MISC: 2 refills | Status: DC

## 2018-12-01 MED ORDER — TRULICITY 0.75 MG/0.5ML ~~LOC~~ SOAJ: 0.7500 mg | SUBCUTANEOUS | 3 refills | Status: DC

## 2018-12-01 NOTE — Progress Notes (Signed)
Name: Cheryl Hart  MRN/ DOB: [id number], [birth year]   Age/ Sex: 43 y.o., female    PCP: Carollee Herter, Alferd Apa, DO   Reason for Endocrinology Evaluation: Type 2 Diabetes Mellitus     Date of Initial Endocrinology Visit: 12/01/2018     PATIENT IDENTIFIER: Cheryl Hart is a 43 y.o. female with a past medical history of HTN, T2DM, and dyslipidemia . The patient presented for initial endocrinology clinic visit on 12/01/2018 for consultative assistance with her diabetes management.    HPI: Cheryl Hart was    Diagnosed with DM in 2010 Prior Medications tried/Intolerance: Invokana- didn;t feel good on it  , victoza- cost , Januvia- cost. Currently checking blood sugars 0 x / day Hypoglycemia episodes  no             Hemoglobin A1c has ranged from 7.4% in 2018, peaking at 11.1% in 2019. Patient required assistance for hypoglycemia: no Patient has required hospitalization within the last 1 year from hyper or hypoglycemia: no   In terms of diet, the patient eats 2 meals a day , snacks more then eats meals, rare sugar-sweetened beverages.   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 Anemia   . Anxiety   . Diabetes mellitus   . Hyperlipidemia   . Uterine fibroid    Past Surgical History:  Past Surgical History:  Procedure Laterality Date  . CESAREAN SECTION    . CHOLECYSTECTOMY    . g1 p1    . LAPAROSCOPIC GELPORT ASSISTED MYOMECTOMY  02/12/2015   Dr Barbie Banner      Social History:  reports that she has never smoked. She has never used smokeless tobacco. She reports that she does not drink alcohol or use drugs. Family History:  Family History  Problem Relation Age of Onset  . Migraines Unknown   . Diabetes Mother   . Diabetes Father   . Diabetes Brother      HOME MEDICATIONS: Allergies as of 12/01/2018      Reactions   Sulfa Antibiotics Other (See Comments), Rash   Sulfonamide Derivatives Photosensitivity, Rash, Other (See Comments)   Severe headache      Medication List       Accurate as of December 01, 2018  4:23 PM. If you have any questions, ask your nurse or doctor.        STOP taking these medications   amoxicillin-clavulanate 875-125 MG tablet Commonly known as: Augmentin Stopped by: Cheryl Sciara, MD   glimepiride 4 MG tablet Commonly known as: AMARYL Stopped by: Cheryl Sciara, MD     TAKE these medications   ALPRAZolam 0.25 MG tablet Commonly known as: XANAX Take 1 tablet by mouth three times daily as needed   Biotin 5000 MCG Tabs Take 5,000 mcg by mouth daily.   blood glucose meter kit and supplies Kit Dispense based on patient and insurance preference. Use up to four times daily  as directed. (FOR ICD-9 250.00, 250.01).   diphenhydrAMINE 25 MG tablet Commonly known as: BENADRYL Take 50 mg by mouth at bedtime as needed for sleep.   DULoxetine 60 MG capsule Commonly known as: CYMBALTA TAKE 1 CAPSULE BY MOUTH ONCE DAILY **NEEDS  OV  BEFORE  ANY  MORE  REFILLS   fluticasone 50 MCG/ACT nasal spray Commonly known as: FLONASE Place 2 sprays into both nostrils daily.   glipiZIDE 10 MG tablet Commonly known as: GLUCOTROL Take 1 tablet (10 mg total) by mouth 2 (two) times daily before a meal. Started by: Cheryl Sciara, MD   glucose blood test strip Use as instructed Started by: Cheryl Hart   ibuprofen 600 MG tablet Commonly known as: ADVIL Take 1 tablet (600 mg total)  by mouth every 6 (six) hours as needed.   losartan 25 MG tablet Commonly known as: Cozaar Take 1 tablet (25 mg total) by mouth daily.   metFORMIN 850 MG tablet Commonly known as: GLUCOPHAGE Take 1 tablet (850 mg total) by mouth 2 (two) times daily with a meal.   multivitamin with minerals Tabs tablet Take 1 tablet by mouth daily.   nystatin powder Commonly known as: nystatin Apply topically 4 (four) times daily. What changed:   when to take this  reasons to take this   onetouch ultrasoft lancets Use as instructed Started by: Cheryl Hart   simvastatin 40 MG tablet Commonly known as: ZOCOR Take 1 tablet (40 mg total) by mouth at bedtime.   Trulicity 7.59 FM/3.8GY Sopn Generic drug: Dulaglutide Inject 0.75 mg into the skin once a week. Started by: Cheryl Sciara, MD        ALLERGIES: Allergies  Allergen Reactions  . Sulfa Antibiotics Other (See Comments) and Rash  . Sulfonamide Derivatives Photosensitivity, Rash and Other (See Comments)    Severe headache     REVIEW OF SYSTEMS: A comprehensive ROS was conducted with the patient and is negative except as per HPI and below:  Review of Systems  Constitutional: Negative for chills and fever.  HENT: Negative for congestion and sore throat.   Eyes: Positive for blurred vision. Negative for pain.  Respiratory: Negative for cough and shortness of breath.   Cardiovascular: Negative for chest pain and palpitations.  Gastrointestinal: Negative for diarrhea and nausea.  Genitourinary: Positive for frequency.  Neurological: Negative for tingling and tremors.  Endo/Heme/Allergies: Positive for polydipsia.  Psychiatric/Behavioral: Positive for depression. The patient is nervous/anxious.        On medications       OBJECTIVE:   VITAL SIGNS: BP 122/82   Pulse (!) 110   Ht 5' 6.5" (1.689 m)   Wt 211 lb 9.6 oz (96 kg)   LMP 10/22/2018   SpO2 97%   BMI 33.64 kg/m    PHYSICAL EXAM:  General: Pt appears  well and is in NAD  Hydration: Well-hydrated with moist mucous membranes and good skin turgor  HEENT: Head: Unremarkable with good dentition. Oropharynx clear without exudate.  Eyes: External eye exam normal without stare, lid lag or exophthalmos.  EOM intact.   Neck: General: Supple without adenopathy or carotid bruits. Thyroid: Thyroid size normal.  No goiter or nodules appreciated. No thyroid bruit.  Lungs: Clear with good BS bilat with no rales, rhonchi, or wheezes  Heart: Tachycardic   Abdomen: Normoactive bowel sounds, soft, nontender, without masses or organomegaly palpable  Extremities:  Lower extremities - No pretibial edema. No lesions.  Skin: Normal texture and temperature to  palpation. No rash noted. No Acanthosis nigricans/skin tags. No lipohypertrophy.  Neuro: MS is good with appropriate affect, pt is alert and Ox3    DM foot exam: 12/01/2018  The skin of the feet is intact without sores or ulcerations. The pedal pulses are 2+ on right and 2+ on left. The sensation is intact to a screening 5.07, 10 gram monofilament bilaterally   DATA REVIEWED:  Lab Results  Component Value Date   HGBA1C 9.0 (A) 12/01/2018   HGBA1C 10.9 (H) 10/01/2018   HGBA1C 11.1 (H) 02/09/2017   Lab Results  Component Value Date   MICROALBUR <0.7 10/01/2018   LDLCALC 113 (H) 10/01/2018   CREATININE 0.58 10/01/2018   Lab Results  Component Value Date   MICRALBCREAT 1.1 10/01/2018    Lab Results  Component Value Date   CHOL 189 10/01/2018   HDL 37.30 (L) 10/01/2018   LDLCALC 113 (H) 10/01/2018   LDLDIRECT 92.0 02/01/2015   TRIG 192.0 (H) 10/01/2018   CHOLHDL 5 10/01/2018       Results for MORRISON, MASSER (MRN [id number]) as of 12/01/2018 16:23  Ref. Range 12/01/2018 08:35  TSH Latest Ref Range: 0.35 - 4.50 uIU/mL 1.84  T4,Free(Direct) Latest Ref Range: 0.60 - 1.60 ng/dL 0.80    In-Office BG 379 mg/dL  ASSESSMENT / PLAN / RECOMMENDATIONS:   1) Type 2 Diabetes Mellitus, Poorly  controlled, Without complications - Most recent A1c of 9.0 %. Goal A1c < 7.0 %.    Plan: GENERAL: I have discussed with the patient the pathophysiology of diabetes. We went over the natural progression of the disease. We talked about both insulin resistance and insulin deficiency. We stressed the importance of lifestyle changes including diet and exercise. I explained the complications associated with diabetes including retinopathy, nephropathy, neuropathy as well as increased risk of cardiovascular disease. We went over the benefit seen with glycemic control.    I explained to the patient that diabetic patients are at higher than normal risk for amputations.    We also discussed the importance of checking glucose at home and having that data available to me.   Glycemic control has been improving with an A1c 10.9% down to 9.0%  Will adjust medications as below, discussed the weight gain with SU . We also discussed GI side effects with GLP-1 agonists, she has no personal hx of pancreatitis and no FH of medullary cancer.   MEDICATIONS: - Stop Glimepiride - Start Glipizide 10 mg 1 tablet before Breakfast and Supper - Continue Metformin 850 mg Twice daily  - Start Trulicity 0.08 mg weekly   EDUCATION / INSTRUCTIONS:  BG monitoring instructions: Patient is instructed to check her blood sugars 2 times a day, before breakfast and supper .  Call [place] [facility] if: BG persistently < 70 or > 300. . I reviewed the Rule of 15 for the treatment of hypoglycemia in detail with the patient. Literature supplied.   2) Diabetic complications:   Eye: Does not have known diabetic retinopathy.   Neuro/ Feet: Does not have known diabetic peripheral neuropathy.  Renal: Patient does not have known baseline CKD. She is on an ACEI/ARB at present.Check urine albumin/creatinine ratio yearly starting at time of diagnosis. If albuminuria is positive, treatment is geared toward better glucose, blood  pressure control and use of ACE inhibitors or ARBs. Monitor electrolytes and creatinine once to twice yearly.   3) Lipids: Patient is  on a simvastatin 40 mg . LDL above goal at 113 mg/dL but she  was just started on statins    4) Tachycardia:    - This is chronic per pt, concerned about thyroid issues  - TFT's are normal , we discussed differential diagnosis of  autonomic neuropathy    F/U in 3 months   Signed electronically by: Mack Guise, MD  [facility]  [facility] [facility] [address] [place], [place] [address] Phone: [phone] FAX: [phone]   CC: Ann Held, DO [address] [place] [address] Phone: [phone]  Fax: [phone]    Return to Endocrinology clinic as below: Future Appointments  Date Time Provider [place]  03/09/2019  7:30 AM Shamleffer, Melanie Crazier, MD [facility] None

## 2018-12-01 NOTE — Telephone Encounter (Signed)
Sent to pharmacy. Attempted to reach pt. No answer. Left vm that I sent to pharmacy.

## 2018-12-01 NOTE — Telephone Encounter (Signed)
MEDICATION: lancets and strips  For One touch   PHARMACY:  [place], [place] - [address]  IS THIS A 90 DAY SUPPLY :   IS PATIENT OUT OF MEDICATION:   IF NOT; HOW MUCH IS LEFT:   LAST APPOINTMENT DATE: @11 /18/2020  NEXT APPOINTMENT DATE:@2 /24/2021  DO WE HAVE YOUR PERMISSION TO LEAVE A DETAILED MESSAGE:  OTHER COMMENTS:    **Let patient know to contact pharmacy at the end of the day to make sure medication is ready. **  ** Please notify patient to allow 48-72 hours to process**  **Encourage patient to contact the pharmacy for refills or they can request refills through [facility]**

## 2018-12-01 NOTE — Patient Instructions (Addendum)
-   Stop Glimepiride - Start Glipizide 10 mg 1 tablet before Breakfast and Supper - Continue Metformin 850 mg Twice daily  - Start Trulicity [id number] mg weekly    - Check sugar before Breakfast and Supper    Choose healthy, lower carb lower calorie snacks: toss salad, cooked vegetables, cottage cheese, peanut butter, low fat cheese / string cheese, lower sodium deli meat, tuna salad or chicken salad     HOW TO TREAT LOW BLOOD SUGARS (Blood sugar LESS THAN 70 MG/DL)  Please follow the RULE OF 15 for the treatment of hypoglycemia treatment (when your (blood sugars are less than 70 mg/dL)    STEP 1: Take 15 grams of carbohydrates when your blood sugar is low, which includes:   3-4 GLUCOSE TABS  OR  3-4 OZ OF JUICE OR REGULAR SODA OR  ONE TUBE OF GLUCOSE GEL     STEP 2: RECHECK blood sugar in 15 MINUTES STEP 3: If your blood sugar is still low at the 15 minute recheck --> then, go back to STEP 1 and treat AGAIN with another 15 grams of carbohydrates.

## 2018-12-03 ENCOUNTER — Telehealth: Payer: Self-pay | Admitting: Internal Medicine

## 2018-12-03 MED ORDER — ONETOUCH ULTRASOFT LANCETS MISC: 3 refills | Status: DC

## 2018-12-03 MED ORDER — GLUCOSE BLOOD VI STRP: ORAL_STRIP | 2 refills | Status: DC

## 2018-12-03 NOTE — Telephone Encounter (Signed)
Patient called stated that the [facility] can not fill the test strips and lancets prescriptions that were sent to the pharmacy.  The RX's need to have the usage instructions on them.

## 2018-12-03 NOTE — Telephone Encounter (Signed)
MEDICATION: Contour Next meter, lancets, and test strips  PHARMACY:  [facility] on [address]  IS THIS A 90 DAY SUPPLY :   IS PATIENT OUT OF MEDICATION: NEW RX  IF NOT; HOW MUCH IS LEFT:   LAST APPOINTMENT DATE: @11 /18/2020  NEXT APPOINTMENT DATE:@2 /24/2021  DO WE HAVE YOUR PERMISSION TO LEAVE A DETAILED MESSAGE:  OTHER COMMENTS: per patient her insurance only covers the Contour Next meter, test strips, and lancets   **Let patient know to contact pharmacy at the end of the day to make sure medication is ready. **  ** Please notify patient to allow 48-72 hours to process**  **Encourage patient to contact the pharmacy for refills or they can request refills through [facility]**

## 2018-12-03 NOTE — Telephone Encounter (Signed)
Updated rx.

## 2018-12-06 ENCOUNTER — Other Ambulatory Visit: Payer: Self-pay

## 2018-12-06 MED ORDER — CONTOUR NEXT ONE KIT: 1.0000 | PACK | Freq: Every day | 0 refills | Status: DC

## 2018-12-06 MED ORDER — CONTOUR NEXT TEST VI STRP: ORAL_STRIP | 12 refills | Status: DC

## 2018-12-06 MED ORDER — CONTOUR NEXT MONITOR W/DEVICE KIT: 1.0000 | PACK | Freq: Every day | 0 refills | Status: DC

## 2018-12-06 MED ORDER — COMFORT LANCETS MISC: 6 refills | Status: DC

## 2018-12-06 MED ORDER — MICROLET LANCETS MISC: 6 refills | Status: DC

## 2018-12-06 NOTE — Telephone Encounter (Signed)
sent 

## 2018-12-06 NOTE — Telephone Encounter (Signed)
Pharmacy called:  Meter needs to be Contour Next not Contour Next One Lancets needs to be the [employer]  Pharmacy requesting RX be re-sent

## 2018-12-06 NOTE — Addendum Note (Signed)
Addended by: Lolita Rieger D on: 12/06/2018 04:26 PM   Modules accepted: Orders

## 2018-12-06 NOTE — Telephone Encounter (Signed)
Rx's addended and resent

## 2018-12-07 ENCOUNTER — Other Ambulatory Visit: Payer: Self-pay | Admitting: Family Medicine

## 2018-12-07 DIAGNOSIS — F411 Generalized anxiety disorder: Secondary | ICD-10-CM

## 2018-12-08 NOTE — Telephone Encounter (Signed)
Requesting: Xanax Contract: 02/10/2016 UDS: 02/10/2016 Last OV: 10/01/2018 Next OV: N/A Last Refill: 11/11/2018, #30--0 RF Database:   Please advise

## 2018-12-12 ENCOUNTER — Encounter: Payer: Self-pay | Admitting: Family Medicine

## 2018-12-15 ENCOUNTER — Ambulatory Visit (INDEPENDENT_AMBULATORY_CARE_PROVIDER_SITE_OTHER): Payer: BC Managed Care – PPO | Admitting: Family Medicine

## 2018-12-15 ENCOUNTER — Other Ambulatory Visit: Payer: Self-pay

## 2018-12-15 ENCOUNTER — Encounter: Payer: Self-pay | Admitting: Family Medicine

## 2018-12-15 VITALS — Ht 66.5 in

## 2018-12-15 DIAGNOSIS — G4700 Insomnia, unspecified: Secondary | ICD-10-CM | POA: Diagnosis not present

## 2018-12-15 MED ORDER — TRAZODONE HCL 50 MG PO TABS: 25.0000 mg | ORAL_TABLET | Freq: Every evening | ORAL | 3 refills | Status: DC | PRN

## 2018-12-15 NOTE — Progress Notes (Signed)
Virtual Visit via Telephone Note  I connected with Cheryl Hart on 12/15/18 at 11:40 AM EST by telephone and verified that I am speaking with the correct person using two identifiers.  Location: Patient: home  Provider: home    I discussed the limitations, risks, security and privacy concerns of performing an evaluation and management service by telephone and the availability of in person appointments. I also discussed with the patient that there may be a patient responsible charge related to this service. The patient expressed understanding and agreed to proceed.   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  She has tried melatonin , benadryl , tylenol pm etc  Nothing has worked    Observations/Objective: There were no vitals filed for this visit.  Pt was unable to get vitals Pt is in NAD  Assessment and Plan:  1. Insomnia, unspecified type Pt has tried several otc remedies inc melatonin Try trazadone 1/2 tab po qhs- traZODone (DESYREL) 50 MG tablet; Take 0.5-1 tablets (25-50 mg total) by mouth at bedtime as needed for sleep.  Dispense: 30 tablet; Refill: 3  Follow Up Instructions:    I discussed the assessment and treatment plan with the patient. The patient was provided an opportunity to ask questions and all were answered. The patient agreed with the plan and demonstrated an understanding of the instructions.   The patient was advised to call back or seek an in-person evaluation if the symptoms worsen or if the condition fails to improve as anticipated.  I provided 15 minutes of non-face-to-face time during this encounter.   Ann Held, DO

## 2018-12-16 ENCOUNTER — Other Ambulatory Visit: Payer: Self-pay | Admitting: Family Medicine

## 2018-12-16 DIAGNOSIS — I10 Essential (primary) hypertension: Secondary | ICD-10-CM

## 2019-01-19 ENCOUNTER — Other Ambulatory Visit: Payer: Self-pay | Admitting: Family Medicine

## 2019-01-19 DIAGNOSIS — E1165 Type 2 diabetes mellitus with hyperglycemia: Secondary | ICD-10-CM

## 2019-01-19 DIAGNOSIS — I10 Essential (primary) hypertension: Secondary | ICD-10-CM

## 2019-01-19 DIAGNOSIS — F411 Generalized anxiety disorder: Secondary | ICD-10-CM

## 2019-01-20 NOTE — Telephone Encounter (Signed)
Last OV 12/15/18 Last refill Alprazolam 12/12/18 #30/0                 Losartan 01/06/19 #30/0                 Metformin 09/13/18 #60/2 Next OV not scheduled

## 2019-02-09 ENCOUNTER — Encounter (INDEPENDENT_AMBULATORY_CARE_PROVIDER_SITE_OTHER): Payer: Self-pay | Admitting: Family Medicine

## 2019-02-09 ENCOUNTER — Other Ambulatory Visit: Payer: Self-pay

## 2019-02-09 ENCOUNTER — Ambulatory Visit (INDEPENDENT_AMBULATORY_CARE_PROVIDER_SITE_OTHER): Payer: 59 | Admitting: Family Medicine

## 2019-02-09 VITALS — BP 112/74 | HR 111 | Temp 98.4°F | Ht 66.0 in | Wt 207.0 lb

## 2019-02-09 DIAGNOSIS — E538 Deficiency of other specified B group vitamins: Secondary | ICD-10-CM

## 2019-02-09 DIAGNOSIS — R5383 Other fatigue: Secondary | ICD-10-CM

## 2019-02-09 DIAGNOSIS — Z1331 Encounter for screening for depression: Secondary | ICD-10-CM

## 2019-02-09 DIAGNOSIS — D649 Anemia, unspecified: Secondary | ICD-10-CM | POA: Insufficient documentation

## 2019-02-09 DIAGNOSIS — E1165 Type 2 diabetes mellitus with hyperglycemia: Secondary | ICD-10-CM

## 2019-02-09 DIAGNOSIS — R0602 Shortness of breath: Secondary | ICD-10-CM

## 2019-02-09 DIAGNOSIS — Z6833 Body mass index (BMI) 33.0-33.9, adult: Secondary | ICD-10-CM

## 2019-02-09 DIAGNOSIS — Z9189 Other specified personal risk factors, not elsewhere classified: Secondary | ICD-10-CM

## 2019-02-09 DIAGNOSIS — E669 Obesity, unspecified: Secondary | ICD-10-CM | POA: Diagnosis not present

## 2019-02-09 DIAGNOSIS — I10 Essential (primary) hypertension: Secondary | ICD-10-CM

## 2019-02-09 DIAGNOSIS — Z0289 Encounter for other administrative examinations: Secondary | ICD-10-CM

## 2019-02-10 LAB — CBC WITH DIFFERENTIAL/PLATELET
Basophils Absolute: 0.1 10*3/uL (ref 0.0–0.2)
Basos: 1 %
EOS (ABSOLUTE): 0.1 10*3/uL (ref 0.0–0.4)
Eos: 2 %
Hematocrit: 35 % (ref 34.0–46.6)
Hemoglobin: 11.3 g/dL (ref 11.1–15.9)
Immature Grans (Abs): 0 10*3/uL (ref 0.0–0.1)
Immature Granulocytes: 0 %
Lymphocytes Absolute: 2.4 10*3/uL (ref 0.7–3.1)
Lymphs: 36 %
MCH: 25.9 pg — ABNORMAL LOW (ref 26.6–33.0)
MCHC: 32.3 g/dL (ref 31.5–35.7)
MCV: 80 fL (ref 79–97)
Monocytes Absolute: 0.4 10*3/uL (ref 0.1–0.9)
Monocytes: 6 %
Neutrophils Absolute: 3.7 10*3/uL (ref 1.4–7.0)
Neutrophils: 55 %
Platelets: 407 10*3/uL (ref 150–450)
RBC: 4.37 x10E6/uL (ref 3.77–5.28)
RDW: 15 % (ref 11.7–15.4)
WBC: 6.7 10*3/uL (ref 3.4–10.8)

## 2019-02-10 LAB — COMPREHENSIVE METABOLIC PANEL
ALT: 21 IU/L (ref 0–32)
AST: 18 IU/L (ref 0–40)
Albumin/Globulin Ratio: 1.4 (ref 1.2–2.2)
Albumin: 4.4 g/dL (ref 3.8–4.8)
Alkaline Phosphatase: 128 IU/L — ABNORMAL HIGH (ref 39–117)
BUN/Creatinine Ratio: 22 (ref 9–23)
BUN: 14 mg/dL (ref 6–24)
Bilirubin Total: <0.2 mg/dL (ref 0.0–1.2)
CO2: 24 mmol/L (ref 20–29)
Calcium: 10 mg/dL (ref 8.7–10.2)
Chloride: 101 mmol/L (ref 96–106)
Creatinine, Ser: 0.64 mg/dL (ref 0.57–1.00)
GFR calc Af Amer: 125 mL/min/{1.73_m2} (ref >59)
GFR calc non Af Amer: 108 mL/min/{1.73_m2} (ref >59)
Globulin, Total: 3.1 g/dL (ref 1.5–4.5)
Glucose: 145 mg/dL — ABNORMAL HIGH (ref 65–99)
Potassium: 4.6 mmol/L (ref 3.5–5.2)
Sodium: 138 mmol/L (ref 134–144)
Total Protein: 7.5 g/dL (ref 6.0–8.5)

## 2019-02-10 LAB — VITAMIN B12: Vitamin B-12: 399 pg/mL (ref 232–1245)

## 2019-02-10 LAB — INSULIN, RANDOM: INSULIN: 73.4 u[IU]/mL — ABNORMAL HIGH (ref 2.6–24.9)

## 2019-02-10 LAB — LIPID PANEL WITH LDL/HDL RATIO
Cholesterol, Total: 161 mg/dL (ref 100–199)
HDL: 41 mg/dL (ref >39)
LDL Chol Calc (NIH): 95 mg/dL (ref 0–99)
LDL/HDL Ratio: 2.3 ratio (ref 0.0–3.2)
Triglycerides: 144 mg/dL (ref 0–149)
VLDL Cholesterol Cal: 25 mg/dL (ref 5–40)

## 2019-02-10 LAB — T3: T3, Total: 94 ng/dL (ref 71–180)

## 2019-02-10 LAB — VITAMIN D 25 HYDROXY (VIT D DEFICIENCY, FRACTURES): Vit D, 25-Hydroxy: 18.1 ng/mL — ABNORMAL LOW (ref 30.0–100.0)

## 2019-02-10 LAB — TSH: TSH: 1.47 u[IU]/mL (ref 0.450–4.500)

## 2019-02-10 LAB — FOLATE: Folate: 6.9 ng/mL (ref >3.0)

## 2019-02-10 LAB — T4, FREE: Free T4: 1 ng/dL (ref 0.82–1.77)

## 2019-02-10 NOTE — Progress Notes (Signed)
Dear Dr. Carollee Herter,   Thank you for referring Cheryl Hart to our clinic. The following note includes my evaluation and treatment recommendations.  Chief Complaint:   OBESITY Cheryl Hart (MR# [id number]) is a 44 y.o. female who presents for evaluation and treatment of obesity and related comorbidities. Sophiamarie was referred to our clinic by Ann Held, DO. Current BMI is Body mass index is 33.41 kg/m.Cheryl Hart has been struggling with her weight for many years and has been unsuccessful in either losing weight, maintaining weight loss, or reaching her healthy weight goal.  Deliah is currently in the action stage of change and ready to dedicate time achieving and maintaining a healthier weight. Jodye is interested in becoming our patient and working on intensive lifestyle modifications including (but not limited to) diet and exercise for weight loss.  Siniya's habits were reviewed today and are as follows: Her family eats meals together, she thinks her family will eat healthier with her, her desired weight loss is 37 pounds, she started gaining weight in her 30's, her heaviest weight ever was 239 pounds, she has significant food cravings issues, she snacks frequently in the evenings, she skips meals frequently, she is frequently drinking liquids with calories, she sometimes makes poor food choices and she struggles with emotional eating.  Cheryl Hart eats out almost daily. She snacks frequently on mostly carb's. For breakfast, she has oatmeal with raisin, craisin and apple, brown sugar with coffee and creamer (quite a bit) (feels full). Lunch is approximately 1:00 PM and consists of banana, half peanut butter sandwich, pear (broken up through the day and feels content). Supper is approximately 6:00 PM and consists of green beans and 3 ounces of steak. After dinner, she has raisin bran crunch.  Depression Screen Cheryl Hart's Food and Mood (modified PHQ-9) score was mildly  positive.  Depression screen PHQ 2/9 02/09/2019  Decreased Interest 1  Down, Depressed, Hopeless 0  PHQ - 2 Score 1  Altered sleeping 1  Tired, decreased energy 2  Change in appetite 1  Feeling bad or failure about yourself  0  Trouble concentrating 1  Moving slowly or fidgety/restless 0  Suicidal thoughts 0  PHQ-9 Score 6  Difficult doing work/chores Not difficult at all   Subjective:   Other fatigue  Dezra admits to daytime somnolence and admits to waking up still tired. Patent has a history of symptoms of daytime fatigue, morning fatigue, morning headache and hypertension. Cheryl Hart generally gets 4 or 5 hours of sleep per night, and states that she has generally restless sleep. Snoring is present. Apneic episodes are not present. Epworth Sleepiness Score is 5. EKG ordered today shows T-wave inversion in leads V5 and V6, as well as leads III and aVF (new since 2012), and sinus tachycardia.  Shortness of breath on exertion  Cheryl Hart notes increasing shortness of breath with exercising and seems to be worsening over time with weight gain. She notes getting out of breath sooner with activity than she used to. This has not gotten worse recently. Cheryl Hart denies shortness of breath at rest or orthopnea. Labs and indirect calorimetry will be ordered today.  Essential hypertension [name] was diagnosed with hypertension in September 2020. She had a few elevated blood pressure readings prior to this diagnosis. Her blood pressure is currently controlled. She denies chest pain, chest pressure or headache.   BP Readings from Last 3 Encounters:  02/09/19 112/74  12/01/18 122/82  10/01/18 134/82   Lab Results  Component Value Date  CREATININE 0.58 10/01/2018   CREATININE 0.67 07/10/2017   CREATININE 0.72 02/09/2017   B12 nutritional deficiency  She notes fatigue. She is not a vegetarian.  She does not have a previous diagnosis of pernicious anemia.  She does not have a history of weight  loss surgery. Labs were discussed with patient today.  Lab Results  Component Value Date   [id number] 02/21/2013   Type 2 diabetes mellitus with hyperglycemia, without long-term current use of insulin (HCC)  Cheryl Hart's last Hgb A1c was 9.0 (improved from 10.9). She is now seeing endocrinology. She recently started on Trulicity. She occasionally has low blood sugars in the evening. Cheryl Hart is on Glipizide, Trulicity and Metformin. She was diagnosed in 2010. Her last eye exam was in December 2020.  Lab Results  Component Value Date   HGBA1C 9.0 (A) 12/01/2018   HGBA1C 10.9 (H) 10/01/2018   HGBA1C 11.1 (H) 02/09/2017   Lab Results  Component Value Date   MICROALBUR <0.7 10/01/2018   LDLCALC 113 (H) 10/01/2018   CREATININE 0.58 10/01/2018   No results found for: INSULIN  At risk for hypoglycemia Cheryl Hart is at increased risk for hypoglycemia due to changes in diet, diagnosis of diabetes, and/or insulin use. Tangy is not currently taking insulin.   Assessment/Plan:   Other fatigue  Cheryl Hart does feel that her weight is causing her energy to be lower than it should be. Fatigue may be related to obesity, depression or many other causes. Labs and EKG will be ordered, and in the meanwhile, Cheryl Hart will focus on self care including making healthy food choices, increasing physical activity and focusing on stress reduction. We will refer patient to cardiology.  Shortness of breath on exertion  Cheryl Hart does feel that she gets out of breath more easily that she used to when she exercises. Cheryl Hart's shortness of breath appears to be obesity related and exercise induced. She has agreed to work on weight loss and gradually increase exercise to treat her exercise induced shortness of breath. Labs and indirect calorimetry will be ordered today. Will continue to monitor closely.  Essential hypertension Cheryl Hart is working on healthy weight loss and exercise to improve blood pressure control. We will  watch for signs of hypotension as she continues her lifestyle modifications. We will order CMP and EKG today.  B12 nutritional deficiency  The diagnosis was reviewed with the patient. Counseling provided today, see below. We will continue to monitor. We will check CBCD and B12 level today. Orders and follow up as documented in patient record.  Counseling . The body needs vitamin B12: to make red blood cells; to make DNA; and to help the nerves work properly so they can carry messages from the brain to the body.  . The main causes of vitamin B12 deficiency include dietary deficiency, digestive diseases, pernicious anemia, and having a surgery in which part of the stomach or small intestine is removed.  . Certain medicines can make it harder for the body to absorb vitamin B12. These medicines include: heartburn medications; some antibiotics; some medications used to treat diabetes, gout, and high cholesterol.  . In some cases, there are no symptoms of this condition. If the condition leads to anemia or nerve damage, various symptoms can occur, such as weakness or fatigue, shortness of breath, and numbness or tingling in your hands and feet.   . Treatment:  o May include taking vitamin B12 supplements.  o Avoid alcohol.  o Eat lots of healthy foods that contain vitamin  B12: - Beef, pork, chicken, [place], and organ meats, such as liver.  - Seafood: This includes clams, rainbow trout, salmon, tuna, and haddock. Eggs.  - Cereal and dairy products that are fortified: This means that vitamin B12 has been added to the food.   Type 2 diabetes mellitus with hyperglycemia, without long-term current use of insulin (HCC)  Good blood sugar control is important to decrease the likelihood of diabetic complications such as nephropathy, neuropathy, limb loss, blindness, coronary artery disease, and death. Intensive lifestyle modification including diet, exercise and weight loss are the first line of treatment for  diabetes. We will check insulin level today.  At risk for hypoglycemia Derika was given approximately 15 minutes of counseling today regarding prevention of hypoglycemia. She was advised of symptoms of hypoglycemia. Omya was instructed to eat regular meals.   Class 1 obesity with serious comorbidity and body mass index (BMI) of 33.0 to 33.9 in adult, unspecified obesity type Aelyn is currently in the action stage of change and her goal is to continue with weight loss efforts. I recommend Reauna begin the structured treatment plan as follows:  She has agreed to the [employer].  Exercise goals: No exercise has been prescribed at this time.   Behavioral modification strategies: increasing lean protein intake, increasing vegetables, meal planning and cooking strategies, keeping healthy foods in the home and planning for success.  She was informed of the importance of frequent follow-up visits to maximize her success with intensive lifestyle modifications for her multiple health conditions. She was informed we would discuss her lab results at her next visit unless there is a critical issue that needs to be addressed sooner. Joshlyn agreed to keep her next visit at the agreed upon time to discuss these results.  Objective:   Blood pressure 112/74, pulse (!) 111, temperature 98.4 F (36.9 C), temperature source Oral, height 5\' 6"  (1.676 m), weight 207 lb (93.9 kg), last menstrual period 12/30/2018, SpO2 96 %. Body mass index is 33.41 kg/m.  EKG: Normal sinus rhythm, rate 102 BPM.  Indirect Calorimeter completed today shows a VO2 of 225 and a REE of 1570.  Her calculated basal metabolic rate is [id number] thus her basal metabolic rate is worse than expected.  General: Cooperative, alert, well developed, in no acute distress. HEENT: Conjunctivae and lids unremarkable. Cardiovascular: Regular rhythm.  Lungs: Normal work of breathing. Neurologic: No focal deficits.   Lab Results   Component Value Date   CREATININE 0.58 10/01/2018   BUN 7 10/01/2018   NA 131 (L) 10/01/2018   K 4.0 10/01/2018   CL 96 10/01/2018   CO2 25 10/01/2018   Lab Results  Component Value Date   ALT 43 (H) 10/01/2018   AST 25 10/01/2018   ALKPHOS 127 (H) 10/01/2018   BILITOT 0.2 10/01/2018   Lab Results  Component Value Date   HGBA1C 9.0 (A) 12/01/2018   HGBA1C 10.9 (H) 10/01/2018   HGBA1C 11.1 (H) 02/09/2017   HGBA1C 7.4 (H) 02/08/2016   HGBA1C 8.8 (H) 10/22/2015   No results found for: INSULIN Lab Results  Component Value Date   TSH 1.84 12/01/2018   Lab Results  Component Value Date   CHOL 189 10/01/2018   HDL 37.30 (L) 10/01/2018   LDLCALC 113 (H) 10/01/2018   LDLDIRECT 92.0 02/01/2015   TRIG 192.0 (H) 10/01/2018   CHOLHDL 5 10/01/2018   Lab Results  Component Value Date   WBC 7.0 07/10/2017   HGB 10.7 (L) 07/10/2017  HCT 32.7 (L) 07/10/2017   MCV 72.8 (L) 07/10/2017   PLT 394 07/10/2017   Lab Results  Component Value Date   IRON 6 (L) 12/25/2014   FERRITIN 3.3 (L) 12/25/2014    Attestation Statements:   This is the patient's first visit at [facility]. The patient's NEW PATIENT PACKET was reviewed at length. Included in the packet: current and past health history, medications, allergies, ROS, gynecologic history (women only), surgical history, family history, social history, weight history, weight loss surgery history (for those that have had weight loss surgery), nutritional evaluation, mood and food questionnaire, PHQ9, Epworth questionnaire, sleep habits questionnaire, patient life and health improvement goals questionnaire. These will all be scanned into the patient's chart under media.   During the visit, I independently reviewed the patient's EKG, bioimpedance scale results, and indirect calorimeter results. I used this information to tailor a meal plan for the patient that will help her to lose weight and will improve her  obesity-related conditions going forward. I performed a medically necessary appropriate examination and/or evaluation. I discussed the assessment and treatment plan with the patient. The patient was provided an opportunity to ask questions and all were answered. The patient agreed with the plan and demonstrated an understanding of the instructions. Labs were ordered at this visit and will be reviewed at the next visit unless more critical results need to be addressed immediately. Clinical information was updated and documented in the EMR.   Time spent on visit including pre-visit chart review and post-visit care was 60 minutes.   I, Doreene Nest, am acting as transcriptionist for Eber Jones, MD.  I have reviewed the above documentation for accuracy and completeness, and I agree with the above. Eber Jones, MD

## 2019-02-17 ENCOUNTER — Encounter: Payer: 59 | Admitting: Dietician

## 2019-02-23 ENCOUNTER — Encounter (INDEPENDENT_AMBULATORY_CARE_PROVIDER_SITE_OTHER): Payer: Self-pay | Admitting: Family Medicine

## 2019-02-23 ENCOUNTER — Other Ambulatory Visit: Payer: Self-pay

## 2019-02-23 ENCOUNTER — Ambulatory Visit (INDEPENDENT_AMBULATORY_CARE_PROVIDER_SITE_OTHER): Payer: 59 | Admitting: Family Medicine

## 2019-02-23 VITALS — BP 115/79 | HR 112 | Temp 98.2°F | Ht 66.0 in | Wt 205.0 lb

## 2019-02-23 DIAGNOSIS — Z9189 Other specified personal risk factors, not elsewhere classified: Secondary | ICD-10-CM | POA: Diagnosis not present

## 2019-02-23 DIAGNOSIS — Z6833 Body mass index (BMI) 33.0-33.9, adult: Secondary | ICD-10-CM | POA: Diagnosis not present

## 2019-02-23 DIAGNOSIS — E669 Obesity, unspecified: Secondary | ICD-10-CM

## 2019-02-23 DIAGNOSIS — E1165 Type 2 diabetes mellitus with hyperglycemia: Secondary | ICD-10-CM | POA: Diagnosis not present

## 2019-02-23 DIAGNOSIS — E559 Vitamin D deficiency, unspecified: Secondary | ICD-10-CM

## 2019-02-23 NOTE — Progress Notes (Signed)
Chief Complaint:   OBESITY Cheryl Hart is here to discuss her progress with her obesity treatment plan along with follow-up of her obesity related diagnoses. Cheryl Hart is on the [employer] and states she is following her eating plan approximately 50% of the time. Cheryl Hart states she is exercising at the gym for 60 minutes.  Today's visit was #: 2 Starting weight: 207 lbs Starting date: 02/09/2019 Today's weight: 205 lbs Today's date: 02/23/2019 Total lbs lost to date: 2 Total lbs lost since last in-office visit: 2  Interim History: Cheryl Hart made some substitutions from the plan. She ate oatmeal for breakfast with a boiled egg. She did have yogurt with granola and a tuna pack and salad for lunch. She ate chicken a few nights, but she didn't weigh it. Cheryl Hart did try the 40 calorie bread, but she didn't like it.  Subjective:   Vitamin D deficiency  Yazaira's Vitamin D level was 18.1 on 02/09/19. She is not on vit D supplementation. She admits fatigue.  Type 2 diabetes mellitus with hyperglycemia, without long-term current use of insulin (HCC) Cheryl Hart's last Hgb A1c was 9.0 and last insulin level was at 73.4 (02/09/19). She is on Trulicity, Glipizide and Metformin. She is measuring her blood sugars and fasting blood sugar is 119.  Lab Results  Component Value Date   HGBA1C 9.0 (A) 12/01/2018   HGBA1C 10.9 (H) 10/01/2018   HGBA1C 11.1 (H) 02/09/2017   Lab Results  Component Value Date   MICROALBUR <0.7 10/01/2018   Cheryl Hart 95 02/09/2019   CREATININE 0.64 02/09/2019   Lab Results  Component Value Date   INSULIN 73.4 (H) 02/09/2019   At risk for deficient intake of food The patient is at a higher than average risk of deficient intake of food due to current food recall.  Assessment/Plan:   Vitamin D deficiency  Low Vitamin D level contributes to fatigue and are associated with obesity, breast, and colon cancer. Cheryl Hart agrees to start prescription Vitamin D @50 ,000 IU  every week #4 with no refills and she will follow-up for routine testing of Vitamin D, at least 2-3 times per year to avoid over-replacement.  Type 2 diabetes mellitus with hyperglycemia, without long-term current use of insulin (HCC) Good blood sugar control is important to decrease the likelihood of diabetic complications such as nephropathy, neuropathy, limb loss, blindness, coronary artery disease, and death. Intensive lifestyle modification including diet, exercise and weight loss are the first line of treatment for diabetes. Cheryl Hart will continue checking her blood sugars at home. The goal is to decrease Glipizide over the next few appointments. We may need to increase in Trulicity at the next appointment.  At risk for deficient intake of food Cheryl Hart was given approximately 15 minutes of deficit intake of food prevention counseling today. Cheryl Hart is at risk for eating too few calories based on current food recall. She was encouraged to focus on meeting caloric and protein goals according to her recommended meal plan.   Class 1 obesity with serious comorbidity and body mass index (BMI) of 33.0 to 33.9 in adult, unspecified obesity type Cheryl Hart is currently in the action stage of change. As such, her goal is to continue with weight loss efforts. She has agreed to keeping a food journal and adhering to recommended goals of 1200 to 1300 calories and 80+ grams of protein daily.   Exercise goals: Cheryl Hart will continue her current exercise routine.  Behavioral modification strategies: increasing lean protein intake, increasing vegetables, meal planning  and cooking strategies, keeping healthy foods in the home and planning for success.  Cheryl Hart has agreed to follow-up with our clinic in 2 weeks. She was informed of the importance of frequent follow-up visits to maximize her success with intensive lifestyle modifications for her multiple health conditions.   Objective:   Blood pressure 115/79, pulse  (!) 112, temperature 98.2 F (36.8 C), temperature source Oral, height 5\' 6"  (1.676 m), weight 205 lb (93 kg), SpO2 98 %. Body mass index is 33.09 kg/m.  General: Cooperative, alert, well developed, in no acute distress. HEENT: Conjunctivae and lids unremarkable. Cardiovascular: Regular rhythm.  Lungs: Normal work of breathing. Neurologic: No focal deficits.   Lab Results  Component Value Date   CREATININE 0.64 02/09/2019   BUN 14 02/09/2019   NA 138 02/09/2019   K 4.6 02/09/2019   CL 101 02/09/2019   CO2 24 02/09/2019   Lab Results  Component Value Date   ALT 21 02/09/2019   AST 18 02/09/2019   ALKPHOS 128 (H) 02/09/2019   BILITOT <0.2 02/09/2019   Lab Results  Component Value Date   HGBA1C 9.0 (A) 12/01/2018   HGBA1C 10.9 (H) 10/01/2018   HGBA1C 11.1 (H) 02/09/2017   HGBA1C 7.4 (H) 02/08/2016   HGBA1C 8.8 (H) 10/22/2015   Lab Results  Component Value Date   INSULIN 73.4 (H) 02/09/2019   Lab Results  Component Value Date   TSH 1.470 02/09/2019   Lab Results  Component Value Date   CHOL 161 02/09/2019   HDL 41 02/09/2019   LDLCALC 95 02/09/2019   LDLDIRECT 92.0 02/01/2015   TRIG 144 02/09/2019   CHOLHDL 5 10/01/2018   Lab Results  Component Value Date   WBC 6.7 02/09/2019   HGB 11.3 02/09/2019   HCT 35.0 02/09/2019   MCV 80 02/09/2019   PLT 407 02/09/2019   Lab Results  Component Value Date   IRON 6 (L) 12/25/2014   FERRITIN 3.3 (L) 12/25/2014    Ref. Range 02/09/2019 12:08  Vitamin D, 25-Hydroxy Latest Ref Range: 30.0 - 100.0 ng/mL 18.1 (L)    Attestation Statements:   Reviewed by clinician on day of visit: allergies, medications, problem list, medical history, surgical history, family history, social history, and previous encounter notes.  I, Doreene Nest, am acting as transcriptionist for Eber Jones, MD.  I have reviewed the above documentation for accuracy and completeness, and I agree with the above. - Ilene Qua,  MD

## 2019-02-24 ENCOUNTER — Telehealth: Payer: Self-pay

## 2019-02-24 NOTE — Telephone Encounter (Signed)
Patient called states that her pharmacy needed prior auth for Dulaglutide (TRULICITY) [id number] SOPN , her insuracne co states that they are waiting for information from the Dr.  Please contact patient with any information.   [place] on [facility] and [address].

## 2019-02-24 NOTE — Telephone Encounter (Signed)
Cheryl Hart Key: [id number] - PA Case ID: [id number] - Rx #[id number] on cover my meds awaiting response from insurance. Pt informed.

## 2019-02-25 MED FILL — TRULICITY 0.75 MG/0.5 ML PE: 0.75 | 28 days supply | Qty: 2 | Fill #0

## 2019-02-25 NOTE — Telephone Encounter (Signed)
Received fax from [place] stating that no PA was needed for trulicity because it is a covered medication  reference # [id number]

## 2019-03-02 ENCOUNTER — Other Ambulatory Visit (INDEPENDENT_AMBULATORY_CARE_PROVIDER_SITE_OTHER): Payer: Self-pay

## 2019-03-02 ENCOUNTER — Encounter (INDEPENDENT_AMBULATORY_CARE_PROVIDER_SITE_OTHER): Payer: Self-pay | Admitting: Family Medicine

## 2019-03-02 DIAGNOSIS — E559 Vitamin D deficiency, unspecified: Secondary | ICD-10-CM

## 2019-03-02 MED ORDER — VITAMIN D (ERGOCALCIFEROL) 1.25 MG (50000 UNIT) PO CAPS: 50000.0000 [IU] | ORAL_CAPSULE | ORAL | 0 refills | Status: DC

## 2019-03-07 ENCOUNTER — Other Ambulatory Visit: Payer: Self-pay

## 2019-03-08 NOTE — Progress Notes (Deleted)
Name: Cheryl Hart  Age/ Sex: 44 y.o., female   MRN/ DOB: [id number], 10/15/1975     PCP: Ann Held, DO   Reason for Endocrinology Evaluation: Type 2 Diabetes Mellitus  Initial Endocrine Consultative Visit: 12/01/2018    PATIENT IDENTIFIER: Cheryl Hart is a 44 y.o. female with a past medical history of HTN, T2DM, and dyslipidemia  . The patient has followed with Endocrinology clinic since 12/01/2018 for consultative assistance with management of her diabetes.  DIABETIC HISTORY:  Cheryl Hart was diagnosed with DM in 2010. She has tried invokana but didn't;t feel good on it , has been unable to afford Januvia or victoza in the past.  Her hemoglobin A1c has ranged from 7.4% in 2018, peaking at 11.1% in 2019.   On her initial visit to our clinic she had an A1c of 9.0%  , she was on Glimepiride and metformin . 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 We switched Glimepiride to Glipizide, started Trulicity and continued metformin  Today (03/08/2019): Cheryl Hart is here for a f/u on diabetes.  She checks her blood sugars *** times daily, preprandial to breakfast and ***. The patient has not had hypoglycemic episodes since the last clinic visit. Otherwise, the patient has not required any recent emergency interventions for hypoglycemia and has not had recent hospitalizations secondary to hyper or hypoglycemic episodes.    ROS: As per HPI and as detailed below: ROS    HOME DIABETES REGIMEN:  Glipizide 10 mg 1 tablet before Breakfast and Supper Metformin 850 mg Twice daily  Trulicity 0.38 mg weekly   Statin: Yes ACE-I/ARB: yes    METER DOWNLOAD SUMMARY: Date range  evaluated: *** Fingerstick Blood Glucose Tests = *** Average Number Tests/Day = *** Overall Mean FS Glucose = *** Standard Deviation = ***  BG Ranges: Low = *** High = ***   Hypoglycemic Events/30 Days: BG < 50 = *** Episodes of symptomatic severe hypoglycemia = ***    DIABETIC COMPLICATIONS: Microvascular complications:    Denies: CKD, retinopathy , neuropathy   Last eye exam: Completed 10/2018  Macrovascular complications:    Denies: CAD, PVD, CVA   HISTORY:  Past Medical History:  Past Medical History:  Diagnosis Date  . Anemia   . Anxiety   . Depression   . Diabetes mellitus   . Hyperlipidemia   . Hypertension   . PCOS (polycystic ovarian syndrome)   . Uterine fibroid   . Vitamin B12 deficiency     Past Surgical History:  Past Surgical History:  Procedure Laterality Date  . CESAREAN SECTION    . CHOLECYSTECTOMY    . g1 p1    . LAPAROSCOPIC GELPORT ASSISTED MYOMECTOMY  02/12/2015   Dr Barbie Banner     Social History:  reports that she has never smoked. She has never used smokeless tobacco. She reports that she does not drink alcohol or use drugs. Family History:  Family History  Problem Relation Age of Onset  . Migraines Other   . Diabetes Mother   . Hypertension Mother   . Hyperlipidemia Mother   . Kidney disease Mother   . Thyroid disease Mother   . Anxiety disorder Mother   . Diabetes Father   . Hypertension Father   .  Anxiety disorder Father   . Liver disease Father   . Alcoholism Father   . Drug abuse Father   . Depression Father   . Diabetes Brother       HOME MEDICATIONS: Allergies as of 03/09/2019      Reactions   Sulfa Antibiotics Other (See Comments), Rash   Sulfonamide Derivatives Photosensitivity, Rash, Other (See Comments)   Severe headache      Medication List       Accurate as of March 08, 2019  3:03 PM. If you have any questions, ask your nurse or doctor.        ALPRAZolam 0.25 MG tablet Commonly known as:  XANAX Take 1 tablet by mouth three times daily as needed   Biotin 5000 MCG Tabs Take 5,000 mcg by mouth daily.   blood glucose meter kit and supplies Kit Dispense based on patient and insurance preference. Use up to four times daily as directed. (FOR ICD-9 250.00, 250.01).   Contour Next One Kit 1 kit by Does not apply route daily.   Contour Next Monitor w/Device Kit 1 kit by Does not apply route daily.   diphenhydrAMINE 25 MG tablet Commonly known as: BENADRYL Take 50 mg by mouth at bedtime as needed for sleep.   DULoxetine 60 MG capsule Commonly known as: CYMBALTA TAKE 1 CAPSULE BY MOUTH ONCE DAILY **NEEDS  OV  BEFORE  ANY  MORE  REFILLS   glipiZIDE 10 MG tablet Commonly known as: GLUCOTROL Take 1 tablet (10 mg total) by mouth 2 (two) times daily before a meal.   glucose blood test strip Use twice a day   Contour Next Test test strip Generic drug: glucose blood Use as instructed to test blood sugar 2 times daily E11.9   ibuprofen 600 MG tablet Commonly known as: ADVIL Take 1 tablet (600 mg total) by mouth every 6 (six) hours as needed.   losartan 25 MG tablet Commonly known as: COZAAR Take 1 tablet by mouth once daily   metFORMIN 850 MG tablet Commonly known as: GLUCOPHAGE TAKE 1 TABLET BY MOUTH TWICE DAILY WITH MEALS   multivitamin with minerals Tabs tablet Take 1 tablet by mouth daily.   onetouch ultrasoft lancets Use to check blood sugars 2 times day   Comfort Lancets Misc Use as directed to test blood sugar 2 times daily E11.9   Microlet Lancets Misc Use as instructed to test blood sugar 2 times daily E11.9   simvastatin 40 MG tablet Commonly known as: ZOCOR Take 1 tablet (40 mg total) by mouth at bedtime.   traZODone 50 MG tablet Commonly known as: DESYREL Take 0.5-1 tablets (25-50 mg total) by mouth at bedtime as needed for sleep.   Trulicity 3.00 TM/2.2QJ Sopn Generic drug: Dulaglutide Inject 0.75 mg into the skin once a week.   Vitamin D  (Ergocalciferol) 1.25 MG (50000 UNIT) Caps capsule Commonly known as: DRISDOL Take 1 capsule (50,000 Units total) by mouth every 7 (seven) days.        OBJECTIVE:   Vital Signs: There were no vitals taken for this visit.  Wt Readings from Last 3 Encounters:  02/23/19 205 lb (93 kg)  02/09/19 207 lb (93.9 kg)  12/01/18 211 lb 9.6 oz (96 kg)     Exam: General: Pt appears well and is in NAD  Lungs: Clear with good BS bilat with no rales, rhonchi, or wheezes  Heart: RRR with normal S1 and S2 and no gallops; no murmurs; no rub  Abdomen:  Normoactive bowel sounds, soft, nontender, without masses or organomegaly palpable  Extremities: No pretibial edema.   Neuro: MS is good with appropriate affect, pt is alert and Ox3    DM foot exam: 12/01/2018  The skin of the feet is intact without sores or ulcerations. The pedal pulses are 2+ on right and 2+ on left. The sensation is intact to a screening 5.07, 10 gram monofilament bilaterally    DATA REVIEWED:  Lab Results  Component Value Date   HGBA1C 9.0 (A) 12/01/2018   HGBA1C 10.9 (H) 10/01/2018   HGBA1C 11.1 (H) 02/09/2017   Lab Results  Component Value Date   MICROALBUR <0.7 10/01/2018   [place] 95 02/09/2019   CREATININE 0.64 02/09/2019   Lab Results  Component Value Date   MICRALBCREAT 1.1 10/01/2018     Lab Results  Component Value Date   CHOL 161 02/09/2019   HDL 41 02/09/2019   LDLCALC 95 02/09/2019   LDLDIRECT 92.0 02/01/2015   TRIG 144 02/09/2019   CHOLHDL 5 10/01/2018         ASSESSMENT / PLAN / RECOMMENDATIONS:    1) Type 2 Diabetes Mellitus, Poorly controlled, Without complications - Most recent A1c of 9.0 %. Goal A1c < 7.0 %.   Plan: MEDICATIONS:  ***  EDUCATION / INSTRUCTIONS:  BG monitoring instructions: Patient is instructed to check her blood sugars *** times a day, ***.  Call [place] [facility] if: BG persistently < 70 or > 300. . I reviewed the Rule of 15 for the treatment  of hypoglycemia in detail with the patient. Literature supplied.  REFERRALS:  ***.   2) Diabetic complications:   Eye: Does *** have known diabetic retinopathy.   Neuro/ Feet: Does *** have known diabetic peripheral neuropathy .   Renal: Patient does *** have known baseline CKD. She   is *** on an ACEI/ARB at present. Check urine albumin/creatinine ratio yearly starting at time of diagnosis. If albuminuria is positive, treatment is geared toward better glucose, blood pressure control and use of ACE inhibitors or ARBs. Monitor electrolytes and creatinine once to twice yearly.   3) Lipids: Patient is *** on a statin.  4) Hypertension: *** at goal of < 140/90 mmHg.    F/U in ***    Signed electronically by: Mack Guise, MD  [facility]  [facility] [address] [place], [place] [address] Phone: [phone] FAX: [phone]   CC: [address] [place] [address] Phone: [phone]  Fax: [phone]  Return to Endocrinology clinic as below: Future Appointments  Date Time Provider [place]  03/09/2019  7:30 AM Dora Clauss, Melanie Crazier, MD [facility] None  03/10/2019 10:20 AM Eber Jones, MD [facility] None

## 2019-03-09 ENCOUNTER — Ambulatory Visit: Payer: BC Managed Care – PPO | Admitting: Internal Medicine

## 2019-03-09 DIAGNOSIS — Z0289 Encounter for other administrative examinations: Secondary | ICD-10-CM

## 2019-03-10 ENCOUNTER — Other Ambulatory Visit: Payer: Self-pay

## 2019-03-10 ENCOUNTER — Ambulatory Visit (INDEPENDENT_AMBULATORY_CARE_PROVIDER_SITE_OTHER): Payer: 59 | Admitting: Family Medicine

## 2019-03-10 ENCOUNTER — Encounter (INDEPENDENT_AMBULATORY_CARE_PROVIDER_SITE_OTHER): Payer: Self-pay | Admitting: Family Medicine

## 2019-03-10 VITALS — BP 103/71 | HR 117 | Temp 98.0°F | Ht 66.0 in | Wt 206.0 lb

## 2019-03-10 DIAGNOSIS — Z6833 Body mass index (BMI) 33.0-33.9, adult: Secondary | ICD-10-CM

## 2019-03-10 DIAGNOSIS — E1165 Type 2 diabetes mellitus with hyperglycemia: Secondary | ICD-10-CM

## 2019-03-10 DIAGNOSIS — E559 Vitamin D deficiency, unspecified: Secondary | ICD-10-CM | POA: Diagnosis not present

## 2019-03-10 DIAGNOSIS — E669 Obesity, unspecified: Secondary | ICD-10-CM | POA: Diagnosis not present

## 2019-03-10 MED ORDER — VITAMIN D (ERGOCALCIFEROL) 1.25 MG (50000 UNIT) PO CAPS: 50000.0000 [IU] | ORAL_CAPSULE | ORAL | 0 refills | Status: DC

## 2019-03-10 NOTE — Progress Notes (Signed)
Chief Complaint:   OBESITY Cheryl Hart is here to discuss her progress with her obesity treatment plan along with follow-up of her obesity related diagnoses. Cheryl Hart is keeping a food journal of 1100 to 1300 calories and 80+ grams of protein daily and states she is following her eating plan approximately 100% of the time. Cheryl Hart states she is exercising 0 minutes 0 times per week.  Today's visit was #: 3 Starting weight: 207 lbs Starting date: 02/09/2019 Today's weight: 206 lbs Today's date: 03/10/2019 Total lbs lost to date: 1 Total lbs lost since last in-office visit: 0  Interim History: Cheryl Hart voices that she has been journaling everything, and she is getting her calories and protein in. She is averaging approximately 70 grams of protein per day, and she is getting in approximately 1300 calories. Cheryl Hart has no hunger. She has no plans for travel in the next few weeks.  Subjective:   Type 2 diabetes mellitus with hyperglycemia, without long-term current use of insulin (HCC) Cheryl Hart's fasting blood sugars average between 160 and 190's. After dinner, patient is eating a carb heavy snack. Cheryl Hart denies hypoglycemia.  Lab Results  Component Value Date   HGBA1C 9.0 (A) 12/01/2018   HGBA1C 10.9 (H) 10/01/2018   HGBA1C 11.1 (H) 02/09/2017   Lab Results  Component Value Date   MICROALBUR <0.7 10/01/2018   Cheryl Hart 95 02/09/2019   CREATININE 0.64 02/09/2019   Lab Results  Component Value Date   INSULIN 73.4 (H) 02/09/2019  , Vitamin D deficiency Cheryl Hart's Vitamin D level was 18.1 on 02/09/19. She is currently taking vit D. She admits fatigue and denies nausea, vomiting or muscle weakness.  Assessment/Plan:   Type 2 diabetes mellitus with hyperglycemia, without long-term current use of insulin ([place]) Cheryl Hart agrees to change the time of Glipizide to approximately 8:00 PM or with her after dinner snack. Patient is to follow up with endocrinology to discuss and increase in  Trulicity and a decrease in Glipizide.  Vitamin D deficiency Low Vitamin D level contributes to fatigue and are associated with obesity, breast, and colon cancer. Cheryl Hart agrees to continue to take prescription Vitamin D @50 ,000 IU every week #4 with no refills and she will follow-up for routine testing of Vitamin D, at least 2-3 times per year to avoid over-replacement.  Class 1 obesity with serious comorbidity and body mass index (BMI) of 33.0 to 33.9 in adult, unspecified obesity type Cheryl Hart is currently in the action stage of change. As such, her goal is to continue with weight loss efforts. She has agreed to keeping a food journal and adhering to recommended goals of 1100 to 1300 calories and 80+ grams of protein daily.   Behavioral modification strategies: increasing lean protein intake, increasing vegetables, meal planning and cooking strategies, keeping healthy foods in the home and planning for success.  Cheryl Hart has agreed to follow-up with our clinic in 2 weeks. She was informed of the importance of frequent follow-up visits to maximize her success with intensive lifestyle modifications for her multiple health conditions.   Objective:   Blood pressure 103/71, pulse (!) 117, temperature 98 F (36.7 C), temperature source Oral, height 5\' 6"  (1.676 m), weight 206 lb (93.4 kg), last menstrual period 02/28/2019, SpO2 98 %. Body mass index is 33.25 kg/m.  General: Cooperative, alert, well developed, in no acute distress. HEENT: Conjunctivae and lids unremarkable. Cardiovascular: Regular rhythm.  Lungs: Normal work of breathing. Neurologic: No focal deficits.   Lab Results  Component Value Date  CREATININE 0.64 02/09/2019   BUN 14 02/09/2019   NA 138 02/09/2019   K 4.6 02/09/2019   CL 101 02/09/2019   CO2 24 02/09/2019   Lab Results  Component Value Date   ALT 21 02/09/2019   AST 18 02/09/2019   ALKPHOS 128 (H) 02/09/2019   BILITOT <0.2 02/09/2019   Lab Results    Component Value Date   HGBA1C 9.0 (A) 12/01/2018   HGBA1C 10.9 (H) 10/01/2018   HGBA1C 11.1 (H) 02/09/2017   HGBA1C 7.4 (H) 02/08/2016   HGBA1C 8.8 (H) 10/22/2015   Lab Results  Component Value Date   INSULIN 73.4 (H) 02/09/2019   Lab Results  Component Value Date   TSH 1.470 02/09/2019   Lab Results  Component Value Date   CHOL 161 02/09/2019   HDL 41 02/09/2019   LDLCALC 95 02/09/2019   LDLDIRECT 92.0 02/01/2015   TRIG 144 02/09/2019   CHOLHDL 5 10/01/2018   Lab Results  Component Value Date   WBC 6.7 02/09/2019   HGB 11.3 02/09/2019   HCT 35.0 02/09/2019   MCV 80 02/09/2019   PLT 407 02/09/2019   Lab Results  Component Value Date   IRON 6 (L) 12/25/2014   FERRITIN 3.3 (L) 12/25/2014    Ref. Range 02/09/2019 12:08  Vitamin D, 25-Hydroxy Latest Ref Range: 30.0 - 100.0 ng/mL 18.1 (L)    Attestation Statements:   Reviewed by clinician on day of visit: allergies, medications, problem list, medical history, surgical history, family history, social history, and previous encounter notes.  Time spent on visit including pre-visit chart review and post-visit charting and care was 15 minutes.   I, Doreene Nest, am acting as transcriptionist for Coralie Common, MD.  I have reviewed the above documentation for accuracy and completeness, and I agree with the above. - Ilene Qua, MD

## 2019-03-17 ENCOUNTER — Other Ambulatory Visit: Payer: Self-pay

## 2019-03-17 ENCOUNTER — Other Ambulatory Visit: Payer: Self-pay | Admitting: Internal Medicine

## 2019-03-17 ENCOUNTER — Ambulatory Visit (INDEPENDENT_AMBULATORY_CARE_PROVIDER_SITE_OTHER): Payer: 59 | Admitting: Internal Medicine

## 2019-03-17 ENCOUNTER — Encounter: Payer: Self-pay | Admitting: Internal Medicine

## 2019-03-17 VITALS — BP 118/68 | HR 125 | Temp 97.9°F | Ht 66.0 in | Wt 209.8 lb

## 2019-03-17 DIAGNOSIS — E1165 Type 2 diabetes mellitus with hyperglycemia: Secondary | ICD-10-CM | POA: Diagnosis not present

## 2019-03-17 DIAGNOSIS — IMO0002 Reserved for concepts with insufficient information to code with codable children: Secondary | ICD-10-CM

## 2019-03-17 DIAGNOSIS — E118 Type 2 diabetes mellitus with unspecified complications: Secondary | ICD-10-CM | POA: Diagnosis not present

## 2019-03-17 LAB — POCT GLYCOSYLATED HEMOGLOBIN (HGB A1C): Hemoglobin A1C: 7.4 % — AB (ref 4.0–5.6)

## 2019-03-17 LAB — GLUCOSE, POCT (MANUAL RESULT ENTRY): POC Glucose: 267 mg/dl — AB (ref 70–99)

## 2019-03-17 MED ORDER — TRULICITY 1.5 MG/0.5ML ~~LOC~~ SOAJ: 1.5000 mg | SUBCUTANEOUS | 11 refills | Status: DC

## 2019-03-17 MED ORDER — GLIPIZIDE 10 MG PO TABS: ORAL_TABLET | ORAL | 3 refills | Status: DC

## 2019-03-17 NOTE — Progress Notes (Signed)
Name: Cheryl Hart  Age/ Sex: 44 y.o., female   MRN/ DOB: [id number], 1975/11/20     PCP: Ann Held, DO   Reason for Endocrinology Evaluation: Type 2 Diabetes Mellitus  Initial Endocrine Consultative Visit: 12/01/2018    PATIENT IDENTIFIER: Cheryl Hart is a 44 y.o. female with a past medical history of HTN, T2DM, and dyslipidemia  . The patient has followed with Endocrinology clinic since 12/01/2018 for consultative assistance with management of her diabetes.  DIABETIC HISTORY:  Cheryl Hart was diagnosed with DM in 2010. She has tried invokana but didn't;t feel good on it , has been unable to afford Januvia or victoza in the past.  Her hemoglobin A1c has ranged from 7.4% in 2018, peaking at 11.1% in 2019.   On her initial visit to our clinic she had an A1c of 9.0%  , she was on Glimepiride and metformin . 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 We switched Glimepiride to Glipizide, started Trulicity and continued metformin  Today (03/17/2019): Cheryl Hart is here for a f/u on diabetes.  She checks her blood sugars 2 times daily, preprandial to breakfast and evening . The patient has not had hypoglycemic episodes since the last clinic visit. Otherwise, the patient has not required any recent emergency interventions for hypoglycemia and has not had recent hospitalizations secondary to hyper or hypoglycemic episodes.    ROS: As per HPI and as detailed below: Review of Systems  Gastrointestinal: Negative for diarrhea and nausea.  Genitourinary: Negative for frequency.  Endo/Heme/Allergies: Negative for polydipsia.      HOME DIABETES REGIMEN:  Glipizide 10 mg 1 tablet before Breakfast and Supper Metformin 850 mg Twice daily  Trulicity 2.67 mg  weekly (Monday)  Statin: Yes ACE-I/ARB: yes    METER DOWNLOAD SUMMARY: Did not bring  Memory recall  Fasting 170-200's Before supper  83-140 mg/dL   DIABETIC COMPLICATIONS: Microvascular complications:    Denies: CKD, retinopathy , neuropathy   Last eye exam: Completed 10/2018  Macrovascular complications:    Denies: CAD, PVD, CVA   HISTORY:  Past Medical History:  Past Medical History:  Diagnosis Date  . Anemia   . Anxiety   . Depression   . Diabetes mellitus   . Hyperlipidemia   . Hypertension   . PCOS (polycystic ovarian syndrome)   . Uterine fibroid   . Vitamin B12 deficiency    Past Surgical History:  Past Surgical History:  Procedure Laterality Date  . CESAREAN SECTION    . CHOLECYSTECTOMY    . g1 p1    . LAPAROSCOPIC GELPORT ASSISTED MYOMECTOMY  02/12/2015   Dr Barbie Banner    Social History:  reports that she has never smoked. She has never used smokeless tobacco. She reports that she does not drink alcohol or use drugs. Family History:  Family History  Problem Relation Age of Onset  . Migraines Other   . Diabetes Mother   . Hypertension Mother   . Hyperlipidemia Mother   . Kidney disease Mother   . Thyroid disease Mother   . Anxiety disorder Mother   . Diabetes Father   . Hypertension Father   . Anxiety disorder Father   . Liver disease Father   . Alcoholism Father   . Drug  abuse Father   . Depression Father   . Diabetes Brother      HOME MEDICATIONS: Allergies as of 03/17/2019      Reactions   Sulfa Antibiotics Other (See Comments), Rash   Sulfonamide Derivatives Photosensitivity, Rash, Other (See Comments)   Severe headache      Medication List       Accurate as of March 17, 2019  7:31 PM. If you have any questions, ask your nurse or doctor.        STOP taking these medications   Trulicity 7.12 WP/8.0DX Sopn Generic drug: Dulaglutide Replaced by: Trulicity 1.5 IP/3.8SN Sopn Stopped by: [name], MD     TAKE  these medications   ALPRAZolam 0.25 MG tablet Commonly known as: XANAX Take 1 tablet by mouth three times daily as needed   Biotin 5000 MCG Tabs Take 5,000 mcg by mouth daily.   blood glucose meter kit and supplies Kit Dispense based on patient and insurance preference. Use up to four times daily as directed. (FOR ICD-9 250.00, 250.01).   Contour Next One Kit 1 kit by Does not apply route daily.   Contour Next Monitor w/Device Kit 1 kit by Does not apply route daily.   diphenhydrAMINE 25 MG tablet Commonly known as: BENADRYL Take 50 mg by mouth at bedtime as needed for sleep.   DULoxetine 60 MG capsule Commonly known as: CYMBALTA TAKE 1 CAPSULE BY MOUTH ONCE DAILY **NEEDS  OV  BEFORE  ANY  MORE  REFILLS   glipiZIDE 10 MG tablet Commonly known as: GLUCOTROL Take 0.5 tablets (5 mg total) by mouth daily before breakfast AND 1 tablet (10 mg total) daily before supper. What changed: See the new instructions. Changed by: [name], MD   glucose blood test strip Use twice a day   Contour Next Test test strip Generic drug: glucose blood Use as instructed to test blood sugar 2 times daily E11.9   ibuprofen 600 MG tablet Commonly known as: ADVIL Take 1 tablet (600 mg total) by mouth every 6 (six) hours as needed.   losartan 25 MG tablet Commonly known as: COZAAR Take 1 tablet by mouth once daily   metFORMIN 850 MG tablet Commonly known as: GLUCOPHAGE TAKE 1 TABLET BY MOUTH TWICE DAILY WITH MEALS   multivitamin with minerals Tabs tablet Take 1 tablet by mouth daily.   onetouch ultrasoft lancets Use to check blood sugars 2 times day   Comfort Lancets Misc Use as directed to test blood sugar 2 times daily E11.9   Microlet Lancets Misc Use as instructed to test blood sugar 2 times daily E11.9   simvastatin 40 MG tablet Commonly known as: ZOCOR Take 1 tablet (40 mg total) by mouth at bedtime.   traZODone 50 MG tablet Commonly known as: DESYREL Take  0.5-1 tablets (25-50 mg total) by mouth at bedtime as needed for sleep.   Trulicity 1.5 KN/3.9JQ Sopn Generic drug: Dulaglutide Inject 1.5 mg into the skin once a week. Replaces: Trulicity 7.34 LP/3.7TK Sopn Started by: [name], MD   Vitamin D (Ergocalciferol) 1.25 MG (50000 UNIT) Caps capsule Commonly known as: DRISDOL Take 1 capsule (50,000 Units total) by mouth every 7 (seven) days.   Vitamin D (Ergocalciferol) 1.25 MG (50000 UNIT) Caps capsule Commonly known as: DRISDOL Take 1 capsule (50,000 Units total) by mouth every 7 (seven) days.        OBJECTIVE:   Vital Signs: BP 118/68 (BP Location: Left Arm, Patient Position:  Sitting, Cuff Size: Normal)   Pulse (!) 125   Temp 97.9 F (36.6 C)   Ht 5' 6"  (1.676 m)   Wt 209 lb 12.8 oz (95.2 kg)   LMP 02/28/2019   SpO2 98%   BMI 33.86 kg/m   Wt Readings from Last 3 Encounters:  03/17/19 209 lb 12.8 oz (95.2 kg)  03/10/19 206 lb (93.4 kg)  02/23/19 205 lb (93 kg)     Exam: General: Pt appears well and is in NAD  Lungs: Clear with good BS bilat with no rales, rhonchi, or wheezes  Heart: RRR with normal S1 and S2 and no gallops; no murmurs; no rub  Abdomen: Normoactive bowel sounds, soft, nontender, without masses or organomegaly palpable  Extremities: No pretibial edema.   Neuro: MS is good with appropriate affect, pt is alert and Ox3    DM foot exam: 12/01/2018  The skin of the feet is intact without sores or ulcerations. The pedal pulses are 2+ on right and 2+ on left. The sensation is intact to a screening 5.07, 10 gram monofilament bilaterally    DATA REVIEWED:  Lab Results  Component Value Date   HGBA1C 7.4 (A) 03/17/2019   HGBA1C 9.0 (A) 12/01/2018   HGBA1C 10.9 (H) 10/01/2018   Lab Results  Component Value Date   MICROALBUR <0.7 10/01/2018   [place] 95 02/09/2019   CREATININE 0.64 02/09/2019   Lab Results  Component Value Date   MICRALBCREAT 1.1 10/01/2018     Lab Results   Component Value Date   CHOL 161 02/09/2019   HDL 41 02/09/2019   LDLCALC 95 02/09/2019   LDLDIRECT 92.0 02/01/2015   TRIG 144 02/09/2019   CHOLHDL 5 10/01/2018        In-office 267 mg/dL (fasting )  ASSESSMENT / PLAN / RECOMMENDATIONS:    1) Type 2 Diabetes Mellitus, Sub-optimally controlled,with improved glycemic control,  Without complications - Most recent A1c of 7.4 %. Goal A1c < 7.0 %.    - A1c down from 9.0%.  I have praised the pt improved glycemic control  - She is tolerating Trulicity without side effects, will increase the dose.  - Given that her before supper BG's have been tight at times, I will reduce morning dose to prevent hypoglycemia with the increase in Trulicity.    MEDICATIONS: - Glipizide 10 mg,  HALF a  tablet before Breakfast and ONE tablet before Supper - Continue Metformin 850 mg Twice daily  - Increase Trulicity 1.5 mg weekly    EDUCATION / INSTRUCTIONS:  BG monitoring instructions: Patient is instructed to check her blood sugars 2 times a day, fasting and supper   Call [place] [facility] clinic if: BG persistently < 70 or > 300. . I reviewed the Rule of 15 for the treatment of hypoglycemia in detail with the patient. Literature supplied.    F/U in 4 months    Signed electronically by: Mack Guise, MD  [facility] Endocrinology  [facility] [address] [place], [place] [address] Phone: [phone] FAX: [phone]   CC: [address] [place] [address] Phone: [phone]  Fax: [phone]  Return to Endocrinology clinic as below: Future Appointments  Date Time Provider [place]  03/29/2019 10:20 AM Eber Jones, MD [facility] None  07/19/2019  7:30 AM Rito Lecomte, Melanie Crazier, MD [facility] None

## 2019-03-17 NOTE — Patient Instructions (Signed)
-   Glipizide 10 mg HALF a  tablet before Breakfast and ONE tablet before Supper - Continue Metformin 850 mg Twice daily  - Increase Trulicity 1.5 mg weekly    - Check sugar before Breakfast and Supper      HOW TO TREAT LOW BLOOD SUGARS (Blood sugar LESS THAN 70 MG/DL)  Please follow the RULE OF 15 for the treatment of hypoglycemia treatment (when your (blood sugars are less than 70 mg/dL)    STEP 1: Take 15 grams of carbohydrates when your blood sugar is low, which includes:   3-4 GLUCOSE TABS  OR  3-4 OZ OF JUICE OR REGULAR SODA OR  ONE TUBE OF GLUCOSE GEL     STEP 2: RECHECK blood sugar in 15 MINUTES STEP 3: If your blood sugar is still low at the 15 minute recheck --> then, go back to STEP 1 and treat AGAIN with another 15 grams of carbohydrates.

## 2019-03-29 ENCOUNTER — Ambulatory Visit (INDEPENDENT_AMBULATORY_CARE_PROVIDER_SITE_OTHER): Payer: 59 | Admitting: Family Medicine

## 2019-03-30 ENCOUNTER — Other Ambulatory Visit: Payer: Self-pay

## 2019-03-30 ENCOUNTER — Ambulatory Visit (INDEPENDENT_AMBULATORY_CARE_PROVIDER_SITE_OTHER): Payer: 59 | Admitting: Family Medicine

## 2019-03-30 ENCOUNTER — Encounter (INDEPENDENT_AMBULATORY_CARE_PROVIDER_SITE_OTHER): Payer: Self-pay | Admitting: Family Medicine

## 2019-03-30 VITALS — BP 120/82 | HR 110 | Temp 97.7°F | Ht 66.0 in | Wt 207.0 lb

## 2019-03-30 DIAGNOSIS — E559 Vitamin D deficiency, unspecified: Secondary | ICD-10-CM | POA: Diagnosis not present

## 2019-03-30 DIAGNOSIS — E669 Obesity, unspecified: Secondary | ICD-10-CM | POA: Diagnosis not present

## 2019-03-30 DIAGNOSIS — E1165 Type 2 diabetes mellitus with hyperglycemia: Secondary | ICD-10-CM | POA: Diagnosis not present

## 2019-03-30 DIAGNOSIS — Z9189 Other specified personal risk factors, not elsewhere classified: Secondary | ICD-10-CM

## 2019-03-30 DIAGNOSIS — R002 Palpitations: Secondary | ICD-10-CM | POA: Diagnosis not present

## 2019-03-30 DIAGNOSIS — Z6833 Body mass index (BMI) 33.0-33.9, adult: Secondary | ICD-10-CM | POA: Diagnosis not present

## 2019-03-31 MED ORDER — VITAMIN D (ERGOCALCIFEROL) 1.25 MG (50000 UNIT) PO CAPS: 50000.0000 [IU] | ORAL_CAPSULE | ORAL | 0 refills | Status: DC

## 2019-03-31 MED FILL — VIT D2 1.25 MG (50,000 UNIT: 1.25 MG | 28 days supply | Qty: 4 | Fill #0

## 2019-03-31 NOTE — Progress Notes (Signed)
Chief Complaint:   OBESITY Cheryl Hart is here to discuss her progress with her obesity treatment plan along with follow-up of her obesity related diagnoses. Cheryl Hart is on keeping a food journal and adhering to recommended goals of 1100-1300 calories and 80+ grams of protein and states she is following her eating plan approximately 50% of the time. Cheryl Hart states she is exercising for 0 minutes 0 times per week.  Today's visit was #: 4 Starting weight: 207 lbs Starting date: 02/09/2019 Today's weight: 207 lbs Today's date: 03/30/2019 Total lbs lost to date: 0 Total lbs lost since last in-office visit: 0  Interim History: [name] voices that she has not lost any weight.  Some days, she has not journaled at all and states that not losing weight the first time journaling has made her not as compliant.  She does voice journaling has given her the chance to eat how is most convenient.  Subjective:   1. Type 2 diabetes mellitus with hyperglycemia, without long-term current use of insulin (HCC) [place]'s Trulicity was recently increased and Endocrinology decreased glipizide to 0.5 tablets in the morning and 1 tablet at night.  This provides significant carb craving control.  FBS 140s-150s and the rest of the day 130s-150s.  Lab Results  Component Value Date   HGBA1C 7.4 (A) 03/17/2019   HGBA1C 9.0 (A) 12/01/2018   HGBA1C 10.9 (H) 10/01/2018   Lab Results  Component Value Date   MICROALBUR <0.7 10/01/2018   Sunflower 95 02/09/2019   CREATININE 0.64 02/09/2019   Lab Results  Component Value Date   INSULIN 73.4 (H) 02/09/2019   2. Vitamin D deficiency Cheryl Hart's Vitamin D level was 18.1 on 02/09/2019. She is currently taking vit D. She denies nausea, vomiting or muscle weakness.  She endorses fatigue.  3. Palpitations Sinus tachycardia on recent EKG.  Tachycardia on almost all prior office visits.  She is feeling fluttering, especially at night (not all the time).  4. At risk for  osteoporosis Cheryl Hart is at higher risk of osteopenia and osteoporosis due to Vitamin D deficiency.   Assessment/Plan:   1. Type 2 diabetes mellitus with hyperglycemia, without long-term current use of insulin (HCC) Good blood sugar control is important to decrease the likelihood of diabetic complications such as nephropathy, neuropathy, limb loss, blindness, coronary artery disease, and death. Intensive lifestyle modification including diet, exercise and weight loss are the first line of treatment for diabetes.   2. Vitamin D deficiency Low Vitamin D level contributes to fatigue and are associated with obesity, breast, and colon cancer. She agrees to continue to take prescription Vitamin D @50 ,000 IU every week and will follow-up for routine testing of Vitamin D, at least 2-3 times per year to avoid over-replacement. - Vitamin D, Ergocalciferol, (DRISDOL) 1.25 MG (50000 UNIT) CAPS capsule; Take 1 capsule (50,000 Units total) by mouth every 7 (seven) days.  Dispense: 4 capsule; Refill: 0  3. Palpitations Will refer to Cardiology, as below. - Ambulatory referral to Cardiology  4. At risk for osteoporosis [place] was given approximately 15 minutes of osteoporosis prevention counseling today. Cheryl Hart is at risk for osteopenia and osteoporosis due to her Vitamin D deficiency. She was encouraged to take her Vitamin D and follow her higher calcium diet and increase strengthening exercise to help strengthen her bones and decrease her risk of osteopenia and osteoporosis.  Repetitive spaced learning was employed today to elicit superior memory formation and behavioral change.  5. Class 1 obesity with serious comorbidity and  body mass index (BMI) of 33.0 to 33.9 in adult, unspecified obesity type Cheryl Hart is currently in the action stage of change. As such, her goal is to continue with weight loss efforts. She has agreed to keeping a food journal and adhering to recommended goals of 1100-1300 calories and  80+ grams of protein.   Exercise goals: No exercise has been prescribed at this time.  Behavioral modification strategies: increasing lean protein intake, better snacking choices, planning for success and keeping a strict food journal.  Cheryl Hart has agreed to follow-up with our clinic in 2 weeks. She was informed of the importance of frequent follow-up visits to maximize her success with intensive lifestyle modifications for her multiple health conditions.   Objective:   Blood pressure 120/82, pulse (!) 110, temperature 97.7 F (36.5 C), temperature source Oral, height 5\' 6"  (1.676 m), weight 207 lb (93.9 kg), last menstrual period 02/28/2019, SpO2 100 %. Body mass index is 33.41 kg/m.  General: Cooperative, alert, well developed, in no acute distress. HEENT: Conjunctivae and lids unremarkable. Cardiovascular: Regular rhythm.  Lungs: Normal work of breathing. Neurologic: No focal deficits.   Lab Results  Component Value Date   CREATININE 0.64 02/09/2019   BUN 14 02/09/2019   NA 138 02/09/2019   K 4.6 02/09/2019   CL 101 02/09/2019   CO2 24 02/09/2019   Lab Results  Component Value Date   ALT 21 02/09/2019   AST 18 02/09/2019   ALKPHOS 128 (H) 02/09/2019   BILITOT <0.2 02/09/2019   Lab Results  Component Value Date   HGBA1C 7.4 (A) 03/17/2019   HGBA1C 9.0 (A) 12/01/2018   HGBA1C 10.9 (H) 10/01/2018   HGBA1C 11.1 (H) 02/09/2017   HGBA1C 7.4 (H) 02/08/2016   Lab Results  Component Value Date   INSULIN 73.4 (H) 02/09/2019   Lab Results  Component Value Date   TSH 1.470 02/09/2019   Lab Results  Component Value Date   CHOL 161 02/09/2019   HDL 41 02/09/2019   LDLCALC 95 02/09/2019   LDLDIRECT 92.0 02/01/2015   TRIG 144 02/09/2019   CHOLHDL 5 10/01/2018   Lab Results  Component Value Date   WBC 6.7 02/09/2019   HGB 11.3 02/09/2019   HCT 35.0 02/09/2019   MCV 80 02/09/2019   PLT 407 02/09/2019   Lab Results  Component Value Date   IRON 6 (L) 12/25/2014     FERRITIN 3.3 (L) 12/25/2014   Attestation Statements:   Reviewed by clinician on day of visit: allergies, medications, problem list, medical history, surgical history, family history, social history, and previous encounter notes.  I, Water quality scientist, CMA, am acting as transcriptionist for Coralie Common, MD.  I have reviewed the above documentation for accuracy and completeness, and I agree with the above. - Ilene Qua, MD

## 2019-04-14 ENCOUNTER — Other Ambulatory Visit: Payer: Self-pay

## 2019-04-14 ENCOUNTER — Ambulatory Visit (INDEPENDENT_AMBULATORY_CARE_PROVIDER_SITE_OTHER): Payer: 59 | Admitting: Family Medicine

## 2019-04-14 ENCOUNTER — Encounter (INDEPENDENT_AMBULATORY_CARE_PROVIDER_SITE_OTHER): Payer: Self-pay | Admitting: Family Medicine

## 2019-04-14 ENCOUNTER — Telehealth: Payer: Self-pay | Admitting: Family Medicine

## 2019-04-14 VITALS — BP 136/81 | HR 108 | Temp 97.6°F | Ht 66.0 in | Wt 206.0 lb

## 2019-04-14 DIAGNOSIS — E1165 Type 2 diabetes mellitus with hyperglycemia: Secondary | ICD-10-CM | POA: Diagnosis not present

## 2019-04-14 DIAGNOSIS — I10 Essential (primary) hypertension: Secondary | ICD-10-CM | POA: Diagnosis not present

## 2019-04-14 DIAGNOSIS — E669 Obesity, unspecified: Secondary | ICD-10-CM

## 2019-04-14 DIAGNOSIS — E559 Vitamin D deficiency, unspecified: Secondary | ICD-10-CM

## 2019-04-14 DIAGNOSIS — Z9189 Other specified personal risk factors, not elsewhere classified: Secondary | ICD-10-CM

## 2019-04-14 DIAGNOSIS — Z6834 Body mass index (BMI) 34.0-34.9, adult: Secondary | ICD-10-CM

## 2019-04-14 MED ORDER — VITAMIN D (ERGOCALCIFEROL) 1.25 MG (50000 UNIT) PO CAPS: 50000.0000 [IU] | ORAL_CAPSULE | ORAL | 0 refills | Status: DC

## 2019-04-14 MED ORDER — GLIPIZIDE 5 MG PO TABS: 5.0000 mg | ORAL_TABLET | Freq: Two times a day (BID) | ORAL | 0 refills | Status: DC

## 2019-04-14 MED ORDER — LOSARTAN POTASSIUM 25 MG PO TABS: 12.5000 mg | ORAL_TABLET | Freq: Every day | ORAL | 0 refills | Status: DC

## 2019-04-14 NOTE — Telephone Encounter (Signed)
Requesting: Xanax Contract: 02/10/2016 UDS: 02/10/2016 Last OV: 12/15/2018 Next OV: 04/18/19 Last Refill: 01/20/19, #30--0 RF Database:   Please advise

## 2019-04-14 NOTE — Telephone Encounter (Signed)
Medication:ALPRAZolam (XANAX) 0.25 MG table DULoxetine (CYMBALTA) 60 MG   Pt has made a medication refill appt.  On Monday in person   Has the patient contacted their pharmacy? No. (If no, request that the patient contact the pharmacy for the refill.) (If yes, when and what did the pharmacy advise?)  Preferred Pharmacy (with phone number or street name):  [place], [place] - [address] [facility]. Phone:  [phone]  Fax:  [phone]      Agent: Please be advised that RX refills may take up to 3 business days. We ask that you follow-up with your pharmacy.

## 2019-04-16 ENCOUNTER — Other Ambulatory Visit: Payer: Self-pay | Admitting: Family Medicine

## 2019-04-16 DIAGNOSIS — F411 Generalized anxiety disorder: Secondary | ICD-10-CM

## 2019-04-16 MED ORDER — ALPRAZOLAM 0.25 MG PO TABS: 0.2500 mg | ORAL_TABLET | Freq: Three times a day (TID) | ORAL | 0 refills | Status: DC | PRN

## 2019-04-16 NOTE — Telephone Encounter (Signed)
Sent in

## 2019-04-18 ENCOUNTER — Other Ambulatory Visit: Payer: Self-pay | Admitting: Family Medicine

## 2019-04-18 ENCOUNTER — Encounter: Payer: Self-pay | Admitting: Family Medicine

## 2019-04-18 ENCOUNTER — Other Ambulatory Visit: Payer: Self-pay

## 2019-04-18 ENCOUNTER — Ambulatory Visit: Payer: 59 | Admitting: Family Medicine

## 2019-04-18 VITALS — BP 120/80 | HR 107 | Temp 97.4°F | Resp 18 | Ht 66.0 in | Wt 210.8 lb

## 2019-04-18 DIAGNOSIS — F411 Generalized anxiety disorder: Secondary | ICD-10-CM

## 2019-04-18 DIAGNOSIS — E1165 Type 2 diabetes mellitus with hyperglycemia: Secondary | ICD-10-CM

## 2019-04-18 DIAGNOSIS — E118 Type 2 diabetes mellitus with unspecified complications: Secondary | ICD-10-CM

## 2019-04-18 DIAGNOSIS — E1142 Type 2 diabetes mellitus with diabetic polyneuropathy: Secondary | ICD-10-CM | POA: Insufficient documentation

## 2019-04-18 DIAGNOSIS — Z79899 Other long term (current) drug therapy: Secondary | ICD-10-CM

## 2019-04-18 DIAGNOSIS — IMO0002 Reserved for concepts with insufficient information to code with codable children: Secondary | ICD-10-CM

## 2019-04-18 MED ORDER — ALPRAZOLAM 0.25 MG PO TABS: 0.2500 mg | ORAL_TABLET | Freq: Three times a day (TID) | ORAL | 1 refills | Status: DC | PRN

## 2019-04-18 MED ORDER — DULOXETINE HCL 60 MG PO CPEP: ORAL_CAPSULE | ORAL | 3 refills | Status: DC

## 2019-04-18 MED FILL — DULoxetine HCL 60 MG CPEP: 60 | 90 days supply | Qty: 90 | Fill #0

## 2019-04-18 MED FILL — ALPRAZolam 0.25 MG TABS: 0.25 | 10 days supply | Qty: 30 | Fill #0

## 2019-04-18 NOTE — Progress Notes (Signed)
Chief Complaint:   OBESITY Cheryl Hart is here to discuss her progress with her obesity treatment plan along with follow-up of her obesity related diagnoses. Cheryl Hart is keeping a food journal and adhering to recommended goals of 1100-1300 calories and 80 grams of protein and states she is following her eating plan approximately 75% of the time. Cheryl Hart states she is exercising 0 minutes 0 times per week.  Today's visit was #: 5 Starting weight: 207 lbs Starting date: 02/09/2019 Today's weight: 206 lbs Today's date: 04/14/2019 Total lbs lost to date: 1 Total lbs lost since last in-office visit: 1  Interim History: Cheryl Hart found something else to incorporate into her meal plan - doing [employer] protein drink. She was able to stay within her calories and achieve her protein goal 4 days out of the week with no hunger. She would like to start some activity in the next few weeks.  Subjective:   Vitamin D deficiency. No nausea, vomiting, or muscle weakness. She endorses fatigue. Last Vitamin D 18.1 on 02/09/2019.  Type 2 diabetes mellitus with hyperglycemia, without long-term current use of insulin (Cheryl Hart). Fasting blood sugars range between 127 and 163 with the lowest in the 80's. She reports a decrease in her appetite.   Lab Results  Component Value Date   HGBA1C 7.4 (A) 03/17/2019   HGBA1C 9.0 (A) 12/01/2018   HGBA1C 10.9 (H) 10/01/2018   Lab Results  Component Value Date   MICROALBUR <0.7 10/01/2018   Cheryl Hart 95 02/09/2019   CREATININE 0.64 02/09/2019   Lab Results  Component Value Date   INSULIN 73.4 (H) 02/09/2019   Essential hypertension. Blood pressure has been running low at home intermittently. Cheryl Hart is on losartan 25 mg.  BP Readings from Last 3 Encounters:  04/18/19 120/80  04/14/19 136/81  03/30/19 120/82   Lab Results  Component Value Date   CREATININE 0.64 02/09/2019   CREATININE 0.58 10/01/2018   CREATININE 0.67 07/10/2017   At risk for osteoporosis.  Cheryl Hart is at higher risk of osteopenia and osteoporosis due to Vitamin D deficiency.   Assessment/Plan:   Vitamin D deficiency. Low Vitamin D level contributes to fatigue and are associated with obesity, breast, and colon cancer. She was given a refill on her Vitamin D, Ergocalciferol, (DRISDOL) 1.25 MG (50000 UNIT) CAPS capsule every week #4 with 0 refills and will follow-up for routine testing of Vitamin D, at least 2-3 times per year to avoid over-replacement.    Type 2 diabetes mellitus with hyperglycemia, without long-term current use of insulin (Cheryl Hart). Good blood sugar control is important to decrease the likelihood of diabetic complications such as nephropathy, neuropathy, limb loss, blindness, coronary artery disease, and death. Intensive lifestyle modification including diet, exercise and weight loss are the first line of treatment for diabetes. Cheryl Hart will decrease her glipizide to 5 mg and was given a prescription for glipiZIDE (GLUCOTROL) 5 MG tablet BID #30 with 0 refills.  Essential hypertension. Cheryl Hart is working on healthy weight loss and exercise to improve blood pressure control. We will watch for signs of hypotension as she continues her lifestyle modifications. She will decrease losartan to 12.5 mg (1/2 pill daily) and was given a prescription for losartan (COZAAR) 25 MG tablet 1/2 pill daily #30 with 0 refills.  At risk for osteoporosis. Cheryl Hart was given approximately 15 minutes of osteoporosis prevention counseling today. Cheryl Hart is at risk for osteopenia and osteoporosis due to her Vitamin D deficiency. She was encouraged to take her Vitamin  D and follow her higher calcium diet and increase strengthening exercise to help strengthen her bones and decrease her risk of osteopenia and osteoporosis.  Repetitive spaced learning was employed today to elicit superior memory formation and behavioral change.  Class 1 obesity with serious comorbidity and body mass index (BMI) of 34.0 to  34.9 in adult, unspecified obesity type.  Cheryl Hart is currently in the action stage of change. As such, her goal is to continue with weight loss efforts. She has agreed to keeping a food journal and adhering to recommended goals of 1100-1300 calories and 80 grams of protein daily.   Exercise goals: Cheryl Hart will start activity 2 days a week.  Behavioral modification strategies: increasing lean protein intake, increasing vegetables, meal planning and cooking strategies and keeping a strict food journal.  Cheryl Hart has agreed to follow-up with our clinic in 2 weeks. She was informed of the importance of frequent follow-up visits to maximize her success with intensive lifestyle modifications for her multiple health conditions.   Objective:   Blood pressure 136/81, pulse (!) 108, temperature 97.6 F (36.4 C), temperature source Oral, height 5\' 6"  (1.676 m), weight 206 lb (93.4 kg), last menstrual period 03/19/2019, SpO2 99 %. Body mass index is 33.25 kg/m.  General: Cooperative, alert, well developed, in no acute distress. HEENT: Conjunctivae and lids unremarkable. Cardiovascular: Regular rhythm.  Lungs: Normal work of breathing. Neurologic: No focal deficits.   Lab Results  Component Value Date   CREATININE 0.64 02/09/2019   BUN 14 02/09/2019   NA 138 02/09/2019   K 4.6 02/09/2019   CL 101 02/09/2019   CO2 24 02/09/2019   Lab Results  Component Value Date   ALT 21 02/09/2019   AST 18 02/09/2019   ALKPHOS 128 (H) 02/09/2019   BILITOT <0.2 02/09/2019   Lab Results  Component Value Date   HGBA1C 7.4 (A) 03/17/2019   HGBA1C 9.0 (A) 12/01/2018   HGBA1C 10.9 (H) 10/01/2018   HGBA1C 11.1 (H) 02/09/2017   HGBA1C 7.4 (H) 02/08/2016   Lab Results  Component Value Date   INSULIN 73.4 (H) 02/09/2019   Lab Results  Component Value Date   TSH 1.470 02/09/2019   Lab Results  Component Value Date   CHOL 161 02/09/2019   HDL 41 02/09/2019   LDLCALC 95 02/09/2019   LDLDIRECT 92.0  02/01/2015   TRIG 144 02/09/2019   CHOLHDL 5 10/01/2018   Lab Results  Component Value Date   WBC 6.7 02/09/2019   HGB 11.3 02/09/2019   HCT 35.0 02/09/2019   MCV 80 02/09/2019   PLT 407 02/09/2019   Lab Results  Component Value Date   IRON 6 (L) 12/25/2014   FERRITIN 3.3 (L) 12/25/2014   Attestation Statements:   Reviewed by clinician on day of visit: allergies, medications, problem list, medical history, surgical history, family history, social history, and previous encounter notes.  I, Michaelene Song, am acting as transcriptionist for Coralie Common, MD   I have reviewed the above documentation for accuracy and completeness, and I agree with the above. - Ilene Qua, MD

## 2019-04-18 NOTE — Progress Notes (Signed)
Patient ID: Cheryl Hart, female    DOB: October 07, 1975  Age: 43 y.o. MRN: [id number]    Subjective:  Subjective  HPI Cheryl Hart presents for f/u anxiety   She is doing well with the meds but ran out of cymalta a month ago and has been feeling dizzy No other complaints She really likes healthy weight and wellness and she see endo  Review of Systems  Constitutional: Negative for activity change, appetite change, diaphoresis, fatigue, fever and unexpected weight change.  HENT: Negative for congestion.   Eyes: Negative for pain, redness and visual disturbance.  Respiratory: Negative for cough, chest tightness, shortness of breath and wheezing.   Cardiovascular: Negative for chest pain, palpitations and leg swelling.  Gastrointestinal: Negative for vomiting.  Endocrine: Negative for cold intolerance, heat intolerance, polydipsia, polyphagia and polyuria.  Genitourinary: Negative for difficulty urinating, dysuria and frequency.  Musculoskeletal: Negative for back pain.  Skin: Negative for rash.  Neurological: Negative for dizziness, light-headedness, numbness and headaches.  Psychiatric/Behavioral: Negative for behavioral problems and dysphoric mood. The patient is not nervous/anxious.     History Past Medical History:  Diagnosis Date  . Anemia   . Anxiety   . Depression   . Diabetes mellitus   . Hyperlipidemia   . Hypertension   . PCOS (polycystic ovarian syndrome)   . Uterine fibroid   . Vitamin B12 deficiency     She has a past surgical history that includes Cesarean section; g1 p1; Cholecystectomy; and Laparoscopic gelport assisted myomectomy (02/12/2015).   Her family history includes Alcoholism in her father; Anxiety disorder in her father and mother; Depression in her father; Diabetes in her brother, father, and mother; Drug abuse in her father; Hyperlipidemia in her mother; Hypertension in her father and mother; Kidney disease in her mother; Liver disease in her father;  Migraines in an other family member; Thyroid disease in her mother.She reports that she has never smoked. She has never used smokeless tobacco. She reports that she does not drink alcohol or use drugs.  Current Outpatient Medications on File Prior to Visit  Medication Sig Dispense Refill  . Biotin 5000 MCG TABS Take 5,000 mcg by mouth daily.     . blood glucose meter kit and supplies KIT Dispense based on patient and insurance preference. Use up to four times daily as directed. (FOR ICD-9 250.00, 250.01). 1 each 0  . Blood Glucose Monitoring Suppl (CONTOUR NEXT MONITOR) w/Device KIT 1 kit by Does not apply route daily. 1 kit 0  . Blood Glucose Monitoring Suppl (CONTOUR NEXT ONE) KIT 1 kit by Does not apply route daily. 1 kit 0  . Comfort Lancets MISC Use as directed to test blood sugar 2 times daily E11.9 100 each 6  . diphenhydrAMINE (BENADRYL) 25 MG tablet Take 50 mg by mouth at bedtime as needed for sleep.     . Dulaglutide (TRULICITY) 1.5 KG/8.8PJ SOPN Inject 1.5 mg into the skin once a week. 4 pen 11  . glipiZIDE (GLUCOTROL) 5 MG tablet Take 1 tablet (5 mg total) by mouth 2 (two) times daily before a meal. 30 tablet 0  . glucose blood (CONTOUR NEXT TEST) test strip Use as instructed to test blood sugar 2 times daily E11.9 100 each 12  . glucose blood test strip Use twice a day 100 each 2  . ibuprofen (ADVIL) 600 MG tablet Take 1 tablet (600 mg total) by mouth every 6 (six) hours as needed. 30 tablet 2  . Lancets (ONETOUCH ULTRASOFT) lancets  Use to check blood sugars 2 times day 100 each 3  . losartan (COZAAR) 25 MG tablet Take 0.5 tablets (12.5 mg total) by mouth daily. 30 tablet 0  . metFORMIN (GLUCOPHAGE) 850 MG tablet TAKE 1 TABLET BY MOUTH TWICE DAILY WITH MEALS 60 tablet 5  . Microlet Lancets MISC Use as instructed to test blood sugar 2 times daily E11.9 100 each 6  . Multiple Vitamin (MULTIVITAMIN WITH MINERALS) TABS tablet Take 1 tablet by mouth daily.    . simvastatin (ZOCOR) 40 MG  tablet Take 1 tablet (40 mg total) by mouth at bedtime. 30 tablet 2  . traZODone (DESYREL) 50 MG tablet Take 0.5-1 tablets (25-50 mg total) by mouth at bedtime as needed for sleep. 30 tablet 3  . Vitamin D, Ergocalciferol, (DRISDOL) 1.25 MG (50000 UNIT) CAPS capsule Take 1 capsule (50,000 Units total) by mouth every 7 (seven) days. 4 capsule 0   No current facility-administered medications on file prior to visit.     Objective:  Objective  Physical Exam Vitals and nursing note reviewed.  Constitutional:      Appearance: She is well-developed.  HENT:     Head: Normocephalic and atraumatic.  Eyes:     Conjunctiva/sclera: Conjunctivae normal.  Neck:     Thyroid: No thyromegaly.     Vascular: No carotid bruit or JVD.  Cardiovascular:     Rate and Rhythm: Normal rate and regular rhythm.     Heart sounds: Normal heart sounds. No murmur.  Pulmonary:     Effort: Pulmonary effort is normal. No respiratory distress.     Breath sounds: Normal breath sounds. No wheezing or rales.  Chest:     Chest wall: No tenderness.  Musculoskeletal:     Cervical back: Normal range of motion and neck supple.  Neurological:     Mental Status: She is alert and oriented to person, place, and time.  Psychiatric:        Mood and Affect: Mood normal.        Behavior: Behavior normal.        Thought Content: Thought content normal.        Judgment: Judgment normal.    BP 120/80 (BP Location: Left Arm, Patient Position: Sitting, Cuff Size: Normal)   Pulse (!) 107   Temp (!) 97.4 F (36.3 C) (Temporal)   Resp 18   Ht 5' 6"  (1.676 m)   Wt 210 lb 12.8 oz (95.6 kg)   LMP 03/19/2019   SpO2 96%   BMI 34.02 kg/m  Wt Readings from Last 3 Encounters:  04/18/19 210 lb 12.8 oz (95.6 kg)  04/14/19 206 lb (93.4 kg)  03/30/19 207 lb (93.9 kg)     Lab Results  Component Value Date   WBC 6.7 02/09/2019   HGB 11.3 02/09/2019   HCT 35.0 02/09/2019   PLT 407 02/09/2019   GLUCOSE 145 (H) 02/09/2019   CHOL  161 02/09/2019   TRIG 144 02/09/2019   HDL 41 02/09/2019   LDLDIRECT 92.0 02/01/2015   LDLCALC 95 02/09/2019   ALT 21 02/09/2019   AST 18 02/09/2019   NA 138 02/09/2019   K 4.6 02/09/2019   CL 101 02/09/2019   CREATININE 0.64 02/09/2019   BUN 14 02/09/2019   CO2 24 02/09/2019   TSH 1.470 02/09/2019   INR 1.12 02/12/2015   HGBA1C 7.4 (A) 03/17/2019   MICROALBUR <0.7 10/01/2018    CT Renal Stone Study  Result Date: 07/10/2017 CLINICAL DATA:  Left  flank pain x2 days. History of nephrolithiasis. EXAM: CT ABDOMEN AND PELVIS WITHOUT CONTRAST TECHNIQUE: Multidetector CT imaging of the abdomen and pelvis was performed following the standard protocol without IV contrast. COMPARISON:  02/23/2007 FINDINGS: Lower chest: No acute abnormality. Hepatobiliary: Fatty infiltration of the liver. Cholecystectomy clips. Pancreas: Unremarkable. No pancreatic ductal dilatation or surrounding inflammatory changes. Spleen: Normal in size without focal abnormality. Adrenals/Urinary Tract: Normal adrenal glands. No nephrolithiasis or hydronephrosis. Ureters decompressed. Urinary bladder incompletely distended. Stomach/Bowel: Stomach and small bowel are nondilated. Normal appendix. Moderate proximal colonic fecal material, decompressed distally. No focal wall thickening or regional inflammatory/edematous change. Vascular/Lymphatic: No significant vascular findings are present. No enlarged abdominal or pelvic lymph nodes. Reproductive: Partially calcified infarcted uterine fibroids. No adnexal mass. Other: No ascites.  No free air. Musculoskeletal: Mild disc narrowing L4-5 with anterior spurring. No fracture or worrisome bone lesion. IMPRESSION: 1. No acute abdominal findings.  No urolithiasis or hydronephrosis. Electronically Signed   By: Lucrezia Europe M.D.   On: 07/10/2017 12:42     Assessment & Plan:  Plan  I have changed Cheryl Hart's DULoxetine. I am also having her maintain her multivitamin with minerals, Biotin,  diphenhydrAMINE, blood glucose meter kit and supplies, ibuprofen, simvastatin, glucose blood, onetouch ultrasoft, Contour Next One, Contour Next Test, Comfort Lancets, Contour Next Monitor, Microlet Lancets, traZODone, metFORMIN, Trulicity, losartan, glipiZIDE, Vitamin D (Ergocalciferol), and ALPRAZolam.  Meds ordered this encounter  Medications  . ALPRAZolam (XANAX) 0.25 MG tablet    Sig: Take 1 tablet (0.25 mg total) by mouth 3 (three) times daily as needed.    Dispense:  30 tablet    Refill:  1  . DULoxetine (CYMBALTA) 60 MG capsule    Sig: TAKE 1 CAPSULE BY MOUTH ONCE DAILY    Dispense:  90 capsule    Refill:  3    Problem List Items Addressed This Visit      Unprioritized   Diabetic polyneuropathy associated with type 2 diabetes mellitus (HCC)   Relevant Medications   ALPRAZolam (XANAX) 0.25 MG tablet   DULoxetine (CYMBALTA) 60 MG capsule   Generalized anxiety disorder    Cont cymbalta and xanax uds and contract updated today Database reviewed       Relevant Medications   ALPRAZolam (XANAX) 0.25 MG tablet   DULoxetine (CYMBALTA) 60 MG capsule   Uncontrolled type 2 diabetes mellitus with complication, without long-term current use of insulin (HCC)   Relevant Medications   DULoxetine (CYMBALTA) 60 MG capsule      Follow-up: No follow-ups on file.  Ann Held, DO

## 2019-04-18 NOTE — Patient Instructions (Signed)

## 2019-04-18 NOTE — Assessment & Plan Note (Signed)
Cont cymbalta and xanax uds and contract updated today Database reviewed

## 2019-04-19 ENCOUNTER — Telehealth: Payer: Self-pay

## 2019-04-19 DIAGNOSIS — R490 Dysphonia: Secondary | ICD-10-CM

## 2019-04-19 MED FILL — TRULICITY 1.5 MG/0.5 ML PEN: 1.5 | 28 days supply | Qty: 2 | Fill #0

## 2019-04-19 NOTE — Telephone Encounter (Signed)
Fax from [facility] stating authorization for trulicity 1.5 mg is not required because it is a covered benefit. Ref#[id number]

## 2019-04-19 NOTE — Telephone Encounter (Signed)
For what reason?   

## 2019-04-19 NOTE — Addendum Note (Signed)
Addended by: Sanda Linger on: 04/19/2019 08:45 AM   Modules accepted: Orders

## 2019-04-19 NOTE — Telephone Encounter (Signed)
Please advise 

## 2019-04-19 NOTE — Telephone Encounter (Signed)
The patient called in to see if Dr, Etter Sjogren could put in a referral for an [facility] specialist please folow up with the patient and advise at [phone]

## 2019-04-20 LAB — PAIN MGMT, PROFILE 8 W/CONF, U
6 Acetylmorphine: NEGATIVE ng/mL
Alcohol Metabolites: NEGATIVE ng/mL (ref <500)
Amphetamines: NEGATIVE ng/mL
Benzodiazepines: NEGATIVE ng/mL
Buprenorphine, Urine: NEGATIVE ng/mL
Cocaine Metabolite: NEGATIVE ng/mL
Creatinine: 83.2 mg/dL
MDMA: NEGATIVE ng/mL
Marijuana Metabolite: NEGATIVE ng/mL
Opiates: NEGATIVE ng/mL
Oxidant: NEGATIVE ug/mL
Oxycodone: NEGATIVE ng/mL
pH: 5.4 (ref 4.5–9.0)

## 2019-04-20 NOTE — Telephone Encounter (Signed)
Referral placed.

## 2019-04-20 NOTE — Telephone Encounter (Signed)
Pt states having voice hoarseness.Pt states no pain, no sore throat, doesn't hurt to swallow, and no drainage or allergies. Pt states sxs stated in December of last year. Please advise

## 2019-04-20 NOTE — Telephone Encounter (Signed)
Ok to place referral.

## 2019-04-25 ENCOUNTER — Telehealth: Payer: Self-pay | Admitting: Family Medicine

## 2019-04-25 DIAGNOSIS — E1165 Type 2 diabetes mellitus with hyperglycemia: Secondary | ICD-10-CM

## 2019-04-25 MED ORDER — METFORMIN HCL 850 MG PO TABS: 850.0000 mg | ORAL_TABLET | Freq: Two times a day (BID) | ORAL | 5 refills | Status: DC

## 2019-04-25 MED FILL — MetFORMIN HCL 850 MG TAB: 850 | 30 days supply | Qty: 60 | Fill #0

## 2019-04-25 NOTE — Telephone Encounter (Signed)
Patient wants her refill to be sent out to new pharmacy metFORMIN (GLUCOPHAGE) 850 MG tablet [id number] [facility] on [address] . Please advise

## 2019-04-25 NOTE — Telephone Encounter (Signed)
Refill sent.

## 2019-04-29 ENCOUNTER — Ambulatory Visit: Payer: 59 | Admitting: Interventional Cardiology

## 2019-05-04 ENCOUNTER — Other Ambulatory Visit: Payer: Self-pay

## 2019-05-04 ENCOUNTER — Ambulatory Visit (INDEPENDENT_AMBULATORY_CARE_PROVIDER_SITE_OTHER): Payer: 59 | Admitting: Family Medicine

## 2019-05-04 ENCOUNTER — Encounter (INDEPENDENT_AMBULATORY_CARE_PROVIDER_SITE_OTHER): Payer: Self-pay | Admitting: Family Medicine

## 2019-05-04 VITALS — BP 133/84 | HR 101 | Temp 97.8°F | Ht 66.0 in | Wt 207.0 lb

## 2019-05-04 DIAGNOSIS — E7849 Other hyperlipidemia: Secondary | ICD-10-CM

## 2019-05-04 DIAGNOSIS — E1165 Type 2 diabetes mellitus with hyperglycemia: Secondary | ICD-10-CM | POA: Diagnosis not present

## 2019-05-04 DIAGNOSIS — E669 Obesity, unspecified: Secondary | ICD-10-CM

## 2019-05-04 DIAGNOSIS — Z6833 Body mass index (BMI) 33.0-33.9, adult: Secondary | ICD-10-CM | POA: Diagnosis not present

## 2019-05-05 NOTE — Progress Notes (Signed)
**Note Cheryl-Identified via Obfuscation** Chief Complaint:   OBESITY Cheryl Hart is here to discuss her progress with her obesity treatment plan along with follow-up of her obesity related diagnoses. Cheryl Hart is keeping a food journal and adhering to recommended goals of 1100-1300 calories and 80 grams of protein and states she is following her eating plan approximately 60% of the time. Cheryl Hart states she is walking the dog 30 minutes 7 times per week.  Today's visit was #: 6 Starting weight: 207 lbs Starting date: 02/09/2019 Today's weight: 207 lbs Today's date: 05/04/2019 Total lbs lost to date: 0 Total lbs lost since last in-office visit: 0  Interim History: Cheryl Hart voices she may not be eating enough protein or actually putting food into [employer]. She did eat out a few times for her birthday. She did get a puppy and is walking the dog twice a day. She has no plans to go anywhere or do anything in the next 2 weeks.  Subjective:   Type 2 diabetes mellitus with hyperglycemia, without long-term current use of insulin ([place]). Cheryl Hart denies feelings of hypoglycemia. She denies GI side effects from metformin or Trulicity. She is checking her blood sugars infrequently, but fasting blood sugars ~120-130's.   Lab Results  Component Value Date   HGBA1C 7.4 (A) 03/17/2019   HGBA1C 9.0 (A) 12/01/2018   HGBA1C 10.9 (H) 10/01/2018   Lab Results  Component Value Date   MICROALBUR <0.7 10/01/2018   Cheryl Hart 95 02/09/2019   CREATININE 0.64 02/09/2019   Lab Results  Component Value Date   INSULIN 73.4 (H) 02/09/2019   Other hyperlipidemia. No myalgias. Cheryl Hart is on a statin.  Lab Results  Component Value Date   CHOL 161 02/09/2019   HDL 41 02/09/2019   LDLCALC 95 02/09/2019   LDLDIRECT 92.0 02/01/2015   TRIG 144 02/09/2019   CHOLHDL 5 10/01/2018   Lab Results  Component Value Date   ALT 21 02/09/2019   AST 18 02/09/2019   ALKPHOS 128 (H) 02/09/2019   BILITOT <0.2 02/09/2019   The 10-year ASCVD risk score  Mikey Bussing DC Jr., et al., 2013) is: 9.6%   Values used to calculate the score:     Age: 44 years     Sex: Female     Is Non-Hispanic African American: Yes     Diabetic: Yes     Tobacco smoker: No     Systolic Blood Pressure: [id number] mmHg     Is BP treated: Yes     HDL Cholesterol: 41 mg/dL     Total Cholesterol: 161 mg/dL  Assessment/Plan:   Type 2 diabetes mellitus with hyperglycemia, without long-term current use of insulin (Cheryl Hart). Good blood sugar control is important to decrease the likelihood of diabetic complications such as nephropathy, neuropathy, limb loss, blindness, coronary artery disease, and death. Intensive lifestyle modification including diet, exercise and weight loss are the first line of treatment for diabetes. Cheryl Hart will continue her current medications as directed (no refill needed).  Other hyperlipidemia. Cardiovascular risk and specific lipid/LDL goals reviewed.  We discussed several lifestyle modifications today and Cheryl Hart will continue to work on diet, exercise and weight loss efforts. Orders and follow up as documented in patient record. She will have follow-up labs in 1 month.  Counseling Intensive lifestyle modifications are the first line treatment for this issue. . Dietary changes: Increase soluble fiber. Decrease simple carbohydrates. . Exercise changes: Moderate to vigorous-intensity aerobic activity 150 minutes per week if tolerated. . Lipid-lowering medications: see documented in  medical record.  Class 1 obesity with serious comorbidity and body mass index (BMI) of 33.0 to 33.9 in adult, unspecified obesity type.  Cheryl Hart is currently in the action stage of change. As such, her goal is to continue with weight loss efforts. She has agreed to keeping a food journal and adhering to recommended goals of 1100-1300 calories and 80+ grams of protein daily.   Exercise goals: For substantial health benefits, adults should do at least 150 minutes (2 hours and 30 minutes)  a week of moderate-intensity, or 75 minutes (1 hour and 15 minutes) a week of vigorous-intensity aerobic physical activity, or an equivalent combination of moderate- and vigorous-intensity aerobic activity. Aerobic activity should be performed in episodes of at least 10 minutes, and preferably, it should be spread throughout the week.  Behavioral modification strategies: increasing lean protein intake, increasing vegetables, meal planning and cooking strategies, keeping healthy foods in the home, planning for success and keeping a strict food journal.  Cheryl Hart has agreed to follow-up with our clinic in 2 weeks. She was informed of the importance of frequent follow-up visits to maximize her success with intensive lifestyle modifications for her multiple health conditions.   Objective:   Blood pressure 133/84, pulse (!) 101, temperature 97.8 F (36.6 C), temperature source Oral, height 5\' 6"  (1.676 m), weight 207 lb (93.9 kg), last menstrual period 04/15/2019, SpO2 95 %. Body mass index is 33.41 kg/m.  General: Cooperative, alert, well developed, in no acute distress. HEENT: Conjunctivae and lids unremarkable. Cardiovascular: Regular rhythm.  Lungs: Normal work of breathing. Neurologic: No focal deficits.   Lab Results  Component Value Date   CREATININE 0.64 02/09/2019   BUN 14 02/09/2019   NA 138 02/09/2019   K 4.6 02/09/2019   CL 101 02/09/2019   CO2 24 02/09/2019   Lab Results  Component Value Date   ALT 21 02/09/2019   AST 18 02/09/2019   ALKPHOS 128 (H) 02/09/2019   BILITOT <0.2 02/09/2019   Lab Results  Component Value Date   HGBA1C 7.4 (A) 03/17/2019   HGBA1C 9.0 (A) 12/01/2018   HGBA1C 10.9 (H) 10/01/2018   HGBA1C 11.1 (H) 02/09/2017   HGBA1C 7.4 (H) 02/08/2016   Lab Results  Component Value Date   INSULIN 73.4 (H) 02/09/2019   Lab Results  Component Value Date   TSH 1.470 02/09/2019   Lab Results  Component Value Date   CHOL 161 02/09/2019   HDL 41  02/09/2019   LDLCALC 95 02/09/2019   LDLDIRECT 92.0 02/01/2015   TRIG 144 02/09/2019   CHOLHDL 5 10/01/2018   Lab Results  Component Value Date   WBC 6.7 02/09/2019   HGB 11.3 02/09/2019   HCT 35.0 02/09/2019   MCV 80 02/09/2019   PLT 407 02/09/2019   Lab Results  Component Value Date   IRON 6 (L) 12/25/2014   FERRITIN 3.3 (L) 12/25/2014   Attestation Statements:   Reviewed by clinician on day of visit: allergies, medications, problem list, medical history, surgical history, family history, social history, and previous encounter notes.  Time spent on visit including pre-visit chart review and post-visit charting and care was 14 minutes.   I, Michaelene Song, am acting as transcriptionist for Coralie Common, MD   I have reviewed the above documentation for accuracy and completeness, and I agree with the above. - Ilene Qua, MD

## 2019-05-17 ENCOUNTER — Other Ambulatory Visit: Payer: Self-pay | Admitting: Family Medicine

## 2019-05-17 DIAGNOSIS — F411 Generalized anxiety disorder: Secondary | ICD-10-CM

## 2019-05-17 MED FILL — LOSARTAN POTASSIUM 25 MG TA: 25 | 90 days supply | Qty: 90 | Fill #0

## 2019-05-17 MED FILL — TRULICITY 1.5 MG/0.5 ML PEN: 1.5 | 28 days supply | Qty: 2 | Fill #1

## 2019-05-19 ENCOUNTER — Ambulatory Visit (INDEPENDENT_AMBULATORY_CARE_PROVIDER_SITE_OTHER): Payer: 59 | Admitting: Family Medicine

## 2019-05-19 ENCOUNTER — Telehealth: Payer: Self-pay | Admitting: Family Medicine

## 2019-05-19 DIAGNOSIS — F411 Generalized anxiety disorder: Secondary | ICD-10-CM

## 2019-05-19 NOTE — Telephone Encounter (Signed)
Requesting:xanax Contract:04/29/19 UDS:04/18/19 Last Visit:04/18/19 Next Visit:n/a Last Refill:04/18/19  Please Advise

## 2019-05-20 ENCOUNTER — Telehealth: Payer: Self-pay | Admitting: Family Medicine

## 2019-05-20 MED FILL — ALPRAZolam 0.25 MG TABS: 0.25 | 10 days supply | Qty: 30 | Fill #0

## 2019-05-20 NOTE — Telephone Encounter (Signed)
Requesting: Xanax Contract: 04/29/2019 UDS: 04/18/19 Last OV: 04/18/19 Next OV: N/A  Last Refill: 04/18/19, #30--0 RF Database:   Please advise

## 2019-05-20 NOTE — Telephone Encounter (Signed)
Refill sent to [facility] for Doc of Day

## 2019-05-20 NOTE — Telephone Encounter (Signed)
Medication: ALPRAZolam (XANAX) 0.25 MG tablet [id number]   Has the patient contacted their pharmacy? No. (If no, request that the patient contact the pharmacy for the refill.) (If yes, when and what did the pharmacy advise?)  Preferred Pharmacy (with phone number or street name):  [place], [place] - [address] [facility].  [address] [place] [address]  Phone:  [phone] Fax:  [phone]  DEA #:  -- Agent: Please be advised that RX refills may take up to 3 business days. We ask that you follow-up with your pharmacy.

## 2019-05-20 NOTE — Telephone Encounter (Signed)
PDMP okay, prescription sent 

## 2019-05-23 NOTE — Telephone Encounter (Signed)
Done 5/7 by Dr Larose Kells

## 2019-05-25 ENCOUNTER — Encounter (INDEPENDENT_AMBULATORY_CARE_PROVIDER_SITE_OTHER): Payer: Self-pay

## 2019-05-25 ENCOUNTER — Ambulatory Visit (INDEPENDENT_AMBULATORY_CARE_PROVIDER_SITE_OTHER): Payer: 59 | Admitting: Family Medicine

## 2019-06-02 ENCOUNTER — Ambulatory Visit (HOSPITAL_COMMUNITY)
Admission: EM | Admit: 2019-06-02 | Discharge: 2019-06-02 | Disposition: A | Discharge status: Home / Self Care | Payer: 59 | Attending: Family Medicine | Admitting: Family Medicine

## 2019-06-02 ENCOUNTER — Encounter (HOSPITAL_COMMUNITY): Payer: Self-pay

## 2019-06-02 ENCOUNTER — Other Ambulatory Visit: Payer: Self-pay

## 2019-06-02 DIAGNOSIS — M25512 Pain in left shoulder: Secondary | ICD-10-CM | POA: Diagnosis not present

## 2019-06-02 DIAGNOSIS — M7502 Adhesive capsulitis of left shoulder: Secondary | ICD-10-CM

## 2019-06-02 MED ORDER — NAPROXEN 500 MG PO TABS: 500.0000 mg | ORAL_TABLET | Freq: Two times a day (BID) | ORAL | 0 refills | Status: DC

## 2019-06-02 MED FILL — NAPROXEN 500 MG TABLET: 500 | 15 days supply | Qty: 30 | Fill #0

## 2019-06-02 NOTE — ED Provider Notes (Signed)
[facility]    CSN: [id number] Arrival date & time: 06/02/19  1154      History   Chief Complaint Chief Complaint  Patient presents with  . Shoulder Pain    HPI Cheryl Hart is a 44 y.o. female.   HPI  Patient has been having worsening shoulder pain off and on for about a month.  It is always in her left shoulder.  She is right-handed.  No overuse.  No injury.  She states today she can hardly move her shoulder.  Usually she will take Advil and it feels somewhat better. Patient is diabetic.  Her last hemoglobin A1c is 7.4.  Past Medical History:  Diagnosis Date  . Anemia   . Anxiety   . Depression   . Diabetes mellitus   . Hyperlipidemia   . Hypertension   . PCOS (polycystic ovarian syndrome)   . Uterine fibroid   . Vitamin B12 deficiency     Patient Active Problem List   Diagnosis Date Noted  . Generalized anxiety disorder 04/18/2019  . Diabetic polyneuropathy associated with type 2 diabetes mellitus ([place]) 04/18/2019  . Anemia   . Dyslipidemia 12/01/2018  . Tachycardia 12/01/2018  . HTN (hypertension) 10/01/2018  . Fibroids   . Fibroid, uterine   . Obesity (BMI 30-39.9) 02/21/2013  . Hyperlipidemia 03/13/2011  . MORBID OBESITY 08/08/2008  . Uncontrolled type 2 diabetes mellitus with complication, without long-term current use of insulin ([place]) 03/31/2008  . UNSPECIFIED ANEMIA 03/31/2008  . GLYCOSURIA 03/29/2008  . LIVER FUNCTION TESTS, ABNORMAL, HX OF 02/28/2007  . ABDOMINAL PAIN 02/17/2007  . ANXIETY 11/18/2006  . GESTATIONAL DIABETES 11/18/2006  . FATIGUE 11/18/2006  . HEADACHE 11/18/2006  . ABDOMINAL PAIN, EPIGASTRIC 11/18/2006    Past Surgical History:  Procedure Laterality Date  . CESAREAN SECTION    . CHOLECYSTECTOMY    . g1 p1    . LAPAROSCOPIC GELPORT ASSISTED MYOMECTOMY  02/12/2015   Dr Barbie Banner    OB History    Gravida  1   Para      Term      Preterm      AB      Living        SAB      TAB      Ectopic      Multiple      Live Births               Home Medications    Prior to Admission medications   Medication Sig Start Date End Date Taking? Authorizing Provider  ALPRAZolam (XANAX) 0.25 MG tablet TAKE 1 TABLET (0.25 MG TOTAL) BY MOUTH 3 (THREE) TIMES DAILY AS NEEDED. 05/20/19   Colon Branch, MD  Biotin 5000 MCG TABS Take 5,000 mcg by mouth daily.     [provider]  blood glucose meter kit and supplies KIT Dispense based on patient and insurance preference. Use up to four times daily as directed. (FOR ICD-9 250.00, 250.01). 02/09/17   Carollee Herter, Alferd Apa, DO  Blood Glucose Monitoring Suppl (CONTOUR NEXT MONITOR) w/Device KIT 1 kit by Does not apply route daily. 12/06/18   Shamleffer, Melanie Crazier, MD  Blood Glucose Monitoring Suppl (CONTOUR NEXT ONE) KIT 1 kit by Does not apply route daily. 12/06/18   Shamleffer, Melanie Crazier, MD  Comfort Lancets MISC Use as directed to test blood sugar 2 times daily E11.9 12/06/18   Shamleffer, Melanie Crazier, MD  diphenhydrAMINE (BENADRYL) 25 MG tablet Take 50  mg by mouth at bedtime as needed for sleep.     [provider]  Dulaglutide (TRULICITY) 1.5 WN/4.6EV SOPN Inject 1.5 mg into the skin once a week. 03/17/19   Shamleffer, Melanie Crazier, MD  DULoxetine (CYMBALTA) 60 MG capsule TAKE 1 CAPSULE BY MOUTH ONCE DAILY 04/18/19   Carollee Herter, Alferd Apa, DO  glipiZIDE (GLUCOTROL) 5 MG tablet Take 1 tablet (5 mg total) by mouth 2 (two) times daily before a meal. 04/14/19   Eber Jones, MD  glucose blood (CONTOUR NEXT TEST) test strip Use as instructed to test blood sugar 2 times daily E11.9 12/06/18   Shamleffer, Melanie Crazier, MD  glucose blood test strip Use twice a day 12/03/18   Shamleffer, Melanie Crazier, MD  ibuprofen (ADVIL) 600 MG tablet Take 1 tablet (600 mg total) by mouth every 6 (six) hours as needed. 10/01/18   Ann Held, DO  Lancets (ONETOUCH ULTRASOFT) lancets Use to check blood sugars 2 times day 12/03/18    Shamleffer, Melanie Crazier, MD  losartan (COZAAR) 25 MG tablet Take 0.5 tablets (12.5 mg total) by mouth daily. 04/14/19   Eber Jones, MD  metFORMIN (GLUCOPHAGE) 850 MG tablet Take 1 tablet (850 mg total) by mouth 2 (two) times daily with a meal. 04/25/19   Carollee Herter, Alferd Apa, DO  Microlet Lancets MISC Use as instructed to test blood sugar 2 times daily E11.9 12/06/18   Shamleffer, Melanie Crazier, MD  Multiple Vitamin (MULTIVITAMIN WITH MINERALS) TABS tablet Take 1 tablet by mouth daily.    [provider]  naproxen (NAPROSYN) 500 MG tablet Take 1 tablet (500 mg total) by mouth 2 (two) times daily. 06/02/19   Raylene Everts, MD  simvastatin (ZOCOR) 40 MG tablet Take 1 tablet (40 mg total) by mouth at bedtime. 10/08/18   Ann Held, DO  traZODone (DESYREL) 50 MG tablet Take 0.5-1 tablets (25-50 mg total) by mouth at bedtime as needed for sleep. 12/15/18   Ann Held, DO  Vitamin D, Ergocalciferol, (DRISDOL) 1.25 MG (50000 UNIT) CAPS capsule Take 1 capsule (50,000 Units total) by mouth every 7 (seven) days. 04/14/19   Eber Jones, MD    Family History Family History  Problem Relation Age of Onset  . Migraines Other   . Diabetes Mother   . Hypertension Mother   . Hyperlipidemia Mother   . Kidney disease Mother   . Thyroid disease Mother   . Anxiety disorder Mother   . Diabetes Father   . Hypertension Father   . Anxiety disorder Father   . Liver disease Father   . Alcoholism Father   . Drug abuse Father   . Depression Father   . Diabetes Brother     Social History Social History   Tobacco Use  . Smoking status: Never Smoker  . Smokeless tobacco: Never Used  Substance Use Topics  . Alcohol use: No  . Drug use: No     Allergies   Sulfa antibiotics and Sulfonamide derivatives   Review of Systems Review of Systems  Musculoskeletal: Positive for arthralgias.     Physical Exam Triage Vital Signs ED Triage Vitals  Enc  Vitals Group     BP 06/02/19 1235 120/69     Pulse Rate 06/02/19 1235 (!) 111     Resp 06/02/19 1235 19     Temp 06/02/19 1235 98.1 F (36.7 C)     Temp Source 06/02/19 1235 Oral  SpO2 06/02/19 1235 90 %     Weight 06/02/19 1233 201 lb (91.2 kg)     Height --      Head Circumference --      Peak Flow --      Pain Score 06/02/19 1233 8     Pain Loc --      Pain Edu? --      Excl. in [place]? --    No data found.  Updated Vital Signs BP 120/69 (BP Location: Right Arm)   Pulse (!) 111   Temp 98.1 F (36.7 C) (Oral)   Resp 19   Wt 91.2 kg   LMP 05/22/2019   SpO2 90%   BMI 32.44 kg/m      Physical Exam Constitutional:      General: She is not in acute distress.    Appearance: She is well-developed.     Comments: Overweight  HENT:     Head: Normocephalic and atraumatic.     Mouth/Throat:     Comments: Mask in place Eyes:     Conjunctiva/sclera: Conjunctivae normal.     Pupils: Pupils are equal, round, and reactive to light.  Cardiovascular:     Rate and Rhythm: Normal rate.  Pulmonary:     Effort: Pulmonary effort is normal. No respiratory distress.  Abdominal:     General: There is no distension.     Palpations: Abdomen is soft.  Musculoskeletal:        General: Normal range of motion.     Cervical back: Normal range of motion.     Comments: Patient can abduct her left shoulder only 10 to 15 degrees.  She has pain with internal and external rotation.  She can forward extend her arm about 50/60 degrees.  Skin:    General: Skin is warm and dry.  Neurological:     Mental Status: She is alert.  Psychiatric:        Mood and Affect: Mood normal.        Behavior: Behavior normal.      UC Treatments / Results  Labs (all labs ordered are listed, but only abnormal results are displayed) Labs Reviewed - No data to display  EKG   Radiology No results found.  Procedures Procedures (including critical care time)  Medications Ordered in UC Medications - No  data to display  Initial Impression / Assessment and Plan / UC Course  I have reviewed the triage vital signs and the nursing notes.  Pertinent labs & imaging results that were available during my care of the patient were reviewed by me and considered in my medical decision making (see chart for details).     Patient has limited mobility consistent with adhesive capsulitis.  I think it is more her joint capsule than rotator cuff based on my exam.  I am unable to do a glenohumeral injection, so we will refer her to sports medicine to see if they can further evaluate and treat.  I did encourage her to move her arm as much as she can to prevent any further loss of movement Final Clinical Impressions(s) / UC Diagnoses   Final diagnoses:  Acute pain of left shoulder  Adhesive capsulitis of left shoulder     Discharge Instructions     Call sports medicine to get appointment next week Resist temptation to hold shoulder still - move it as much as you can tolerate Take the naproxen 2 x a day with food   ED Prescriptions  Medication Sig Dispense Auth. Provider   naproxen (NAPROSYN) 500 MG tablet Take 1 tablet (500 mg total) by mouth 2 (two) times daily. 30 tablet Raylene Everts, MD     PDMP not reviewed this encounter.   Raylene Everts, MD 06/02/19 [phone]

## 2019-06-02 NOTE — ED Triage Notes (Signed)
Pt is here with left shoulder pain that started a month ago, pt has taken Advil to relieve discomfort.

## 2019-06-02 NOTE — Discharge Instructions (Signed)
Call sports medicine to get appointment next week Resist temptation to hold shoulder still - move it as much as you can tolerate Take the naproxen 2 x a day with food

## 2019-06-08 ENCOUNTER — Ambulatory Visit: Payer: 59 | Admitting: Interventional Cardiology

## 2019-06-15 ENCOUNTER — Other Ambulatory Visit: Payer: Self-pay | Admitting: Internal Medicine

## 2019-06-15 DIAGNOSIS — F411 Generalized anxiety disorder: Secondary | ICD-10-CM

## 2019-06-15 MED FILL — TRULICITY 1.5 MG/0.5 ML PEN: 1.5 | 28 days supply | Qty: 2 | Fill #2

## 2019-06-20 ENCOUNTER — Other Ambulatory Visit: Payer: Self-pay

## 2019-06-20 DIAGNOSIS — F411 Generalized anxiety disorder: Secondary | ICD-10-CM

## 2019-06-20 NOTE — Telephone Encounter (Signed)
Patient called in to get a prescription refill for ALPRAZolam Cheryl Hart) 0.25 MG tablet [[id number]]    Please send it to [place], [place] - [address] [facility].  [address] [place] [address]  Phone:  [phone] Fax:  [phone]  DEA #:  --

## 2019-06-21 MED ORDER — ALPRAZOLAM 0.25 MG PO TABS: 0.2500 mg | ORAL_TABLET | Freq: Three times a day (TID) | ORAL | 0 refills | Status: DC | PRN

## 2019-06-21 NOTE — Telephone Encounter (Signed)
Requesting:Alprazolam Contract:none UDS:04/19/2019 Last Visit:04/18/2019 Next Visit:none with pcp scheduled Last Refill: 05/20/2019  Please Advise

## 2019-06-27 MED FILL — MetFORMIN HCL 850 MG TAB: 850 | 30 days supply | Qty: 60 | Fill #2

## 2019-07-13 ENCOUNTER — Other Ambulatory Visit: Payer: Self-pay | Admitting: Family Medicine

## 2019-07-13 DIAGNOSIS — F411 Generalized anxiety disorder: Secondary | ICD-10-CM

## 2019-07-13 MED FILL — DULoxetine HCL 60 MG CPEP: 60 | 90 days supply | Qty: 90 | Fill #1

## 2019-07-13 MED FILL — TRULICITY 1.5 MG/0.5 ML PEN: 1.5 | 28 days supply | Qty: 2 | Fill #3

## 2019-07-17 ENCOUNTER — Other Ambulatory Visit: Payer: Self-pay | Admitting: Family Medicine

## 2019-07-17 DIAGNOSIS — F411 Generalized anxiety disorder: Secondary | ICD-10-CM

## 2019-07-19 ENCOUNTER — Ambulatory Visit: Payer: 59 | Admitting: Internal Medicine

## 2019-07-19 ENCOUNTER — Ambulatory Visit (INDEPENDENT_AMBULATORY_CARE_PROVIDER_SITE_OTHER): Payer: 59 | Admitting: Internal Medicine

## 2019-07-19 ENCOUNTER — Encounter: Payer: Self-pay | Admitting: Internal Medicine

## 2019-07-19 ENCOUNTER — Other Ambulatory Visit: Payer: Self-pay

## 2019-07-19 VITALS — BP 144/96 | HR 108 | Ht 66.0 in | Wt 205.4 lb

## 2019-07-19 DIAGNOSIS — E1165 Type 2 diabetes mellitus with hyperglycemia: Secondary | ICD-10-CM

## 2019-07-19 LAB — POCT GLYCOSYLATED HEMOGLOBIN (HGB A1C): Hemoglobin A1C: 6.8 % — AB (ref 4.0–5.6)

## 2019-07-19 LAB — GLUCOSE, POCT (MANUAL RESULT ENTRY): POC Glucose: 154 mg/dl — AB (ref 70–99)

## 2019-07-19 MED ORDER — ALPRAZOLAM 0.25 MG PO TABS: 0.2500 mg | ORAL_TABLET | Freq: Three times a day (TID) | ORAL | 0 refills | Status: DC | PRN

## 2019-07-19 MED FILL — ALPRAZolam 0.25 MG TABS: 0.25 | 10 days supply | Qty: 30 | Fill #0

## 2019-07-19 NOTE — Progress Notes (Signed)
Name: Cheryl Hart  Age/ Sex: 44 y.o., female   MRN/ DOB: [id number], 11/11/1975     PCP: Carollee Herter, Alferd Apa, DO   Reason for Endocrinology Evaluation: Type 2 Diabetes Mellitus  Initial Endocrine Consultative Visit: 12/01/2018    PATIENT IDENTIFIER: [name] is a 44 y.o. female with a past medical history of HTN, T2DM, and dyslipidemia  . The patient has followed with Endocrinology clinic since 12/01/2018 for consultative assistance with management of her diabetes.  DIABETIC HISTORY:  [name] was diagnosed with DM in 2010. She has tried invokana but didn't;t feel good on it , has been unable to afford Januvia or victoza in the past.  Her hemoglobin A1c has ranged from 7.4% in 2018, peaking at 11.1% in 2019.   On her initial visit to our clinic she had an A1c of 9.0%  , she was on Glimepiride and metformin . 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 We continued  Glipizide and Metformin and increased Trulicity .  Today (07/19/2019): [name] is here for a f/u on diabetes.  She checks her blood sugars 1times daily, preprandial to breakfast . The patient has not had hypoglycemic episodes since the last clinic visit.    ROS: As per HPI and as detailed below: Review of Systems  Gastrointestinal: Negative for diarrhea and nausea.  Genitourinary: Negative for frequency.  Endo/Heme/Allergies: Negative for polydipsia.      HOME DIABETES REGIMEN:  Glipizide 10 mg. 0.5  tablet before Breakfast and half tablet before Supper Metformin 850 mg Twice daily  Trulicity 1.5 mg weekly (Monday)     Statin: Yes ACE-I/ARB: yes    METER DOWNLOAD SUMMARY: Did not bring    DIABETIC COMPLICATIONS: Microvascular complications:    Denies: CKD,  retinopathy , neuropathy   Last eye exam: Completed 10/2018  Macrovascular complications:    Denies: CAD, PVD, CVA   HISTORY:  Past Medical History:  Past Medical History:  Diagnosis Date   Anemia    Anxiety    Depression    Diabetes mellitus    Hyperlipidemia    Hypertension    PCOS (polycystic ovarian syndrome)    Uterine fibroid    Vitamin B12 deficiency    Past Surgical History:  Past Surgical History:  Procedure Laterality Date   CESAREAN SECTION     CHOLECYSTECTOMY     g1 p1     LAPAROSCOPIC GELPORT ASSISTED MYOMECTOMY  02/12/2015   Dr Cheryl Hart    Social History:  reports that she has never smoked. She has never used smokeless tobacco. She reports that she does not drink alcohol and does not use drugs. Family History:  Family History  Problem Relation Age of Onset   Migraines Other    Diabetes Mother    Hypertension Mother    Hyperlipidemia Mother    Kidney disease Mother    Thyroid disease Mother    Anxiety disorder Mother    Diabetes Father    Hypertension Father    Anxiety disorder Father    Liver disease Father    Alcoholism Father    Drug abuse Father    Depression Father    Diabetes Brother      HOME MEDICATIONS: Allergies as of 07/19/2019  Reactions   Sulfa Antibiotics Other (See Comments), Rash   Sulfonamide Derivatives Photosensitivity, Rash, Other (See Comments)   Severe headache      Medication List       Accurate as of July 19, 2019  8:06 AM. If you have any questions, ask your nurse or doctor.        ALPRAZolam 0.25 MG tablet Commonly known as: XANAX Take 1 tablet (0.25 mg total) by mouth 3 (three) times daily as needed.   Biotin 5000 MCG Tabs Take 5,000 mcg by mouth daily.   blood glucose meter kit and supplies Kit Dispense based on patient and insurance preference. Use up to four times daily as directed. (FOR ICD-9 250.00, 250.01).   Contour Next One Kit 1 kit by Does not apply route  daily.   Contour Next Monitor w/Device Kit 1 kit by Does not apply route daily.   diphenhydrAMINE 25 MG tablet Commonly known as: BENADRYL Take 50 mg by mouth at bedtime as needed for sleep.   DULoxetine 60 MG capsule Commonly known as: CYMBALTA TAKE 1 CAPSULE BY MOUTH ONCE DAILY   glipiZIDE 5 MG tablet Commonly known as: GLUCOTROL Take 1 tablet (5 mg total) by mouth 2 (two) times daily before a meal.   glucose blood test strip Use twice a day   Contour Next Test test strip Generic drug: glucose blood Use as instructed to test blood sugar 2 times daily E11.9   ibuprofen 600 MG tablet Commonly known as: ADVIL Take 1 tablet (600 mg total) by mouth every 6 (six) hours as needed.   losartan 25 MG tablet Commonly known as: COZAAR Take 0.5 tablets (12.5 mg total) by mouth daily.   metFORMIN 850 MG tablet Commonly known as: GLUCOPHAGE Take 1 tablet (850 mg total) by mouth 2 (two) times daily with a meal.   multivitamin with minerals Tabs tablet Take 1 tablet by mouth daily.   naproxen 500 MG tablet Commonly known as: NAPROSYN Take 1 tablet (500 mg total) by mouth 2 (two) times daily.   onetouch ultrasoft lancets Use to check blood sugars 2 times day   Comfort Lancets Misc Use as directed to test blood sugar 2 times daily E11.9   Microlet Lancets Misc Use as instructed to test blood sugar 2 times daily E11.9   simvastatin 40 MG tablet Commonly known as: ZOCOR Take 1 tablet (40 mg total) by mouth at bedtime.   traZODone 50 MG tablet Commonly known as: DESYREL Take 0.5-1 tablets (25-50 mg total) by mouth at bedtime as needed for sleep.   Trulicity 1.5 JO/8.4ZY Sopn Generic drug: Dulaglutide Inject 1.5 mg into the skin once a week.   Vitamin D (Ergocalciferol) 1.25 MG (50000 UNIT) Caps capsule Commonly known as: DRISDOL Take 1 capsule (50,000 Units total) by mouth every 7 (seven) days.        OBJECTIVE:   Vital Signs: BP (!) 144/96 (BP Location: Left  Arm, Patient Position: Sitting, Cuff Size: Normal)    Pulse (!) 108    Ht 5' 6"  (1.676 m)    Wt 205 lb 6.4 oz (93.2 kg)    SpO2 98%    BMI 33.15 kg/m   Wt Readings from Last 3 Encounters:  07/19/19 205 lb 6.4 oz (93.2 kg)  06/02/19 201 lb (91.2 kg)  05/04/19 207 lb (93.9 kg)     Exam: General: Pt appears well and is in NAD  Lungs: Clear with good BS bilat with no rales, rhonchi, or wheezes  Heart: RRR with normal S1 and S2 and no gallops; no murmurs; no rub  Abdomen: Normoactive bowel sounds, soft, nontender, without masses or organomegaly palpable  Extremities: No pretibial edema.   Neuro: MS is good with appropriate affect, pt is alert and Ox3    DM foot exam: 07/19/2019  The skin of the feet is intact without sores or ulcerations. The pedal pulses are 2+ on right and 2+ on left. The sensation is intact to a screening 5.07, 10 gram monofilament bilaterally    DATA REVIEWED:  Lab Results  Component Value Date   HGBA1C 7.4 (A) 03/17/2019   HGBA1C 9.0 (A) 12/01/2018   HGBA1C 10.9 (H) 10/01/2018   Lab Results  Component Value Date   MICROALBUR <0.7 10/01/2018   [place] 95 02/09/2019   CREATININE 0.64 02/09/2019   Lab Results  Component Value Date   MICRALBCREAT 1.1 10/01/2018     Lab Results  Component Value Date   CHOL 161 02/09/2019   HDL 41 02/09/2019   LDLCALC 95 02/09/2019   LDLDIRECT 92.0 02/01/2015   TRIG 144 02/09/2019   CHOLHDL 5 10/01/2018        In-office 154 mg/dL (fasting )  ASSESSMENT / PLAN / RECOMMENDATIONS:    1) Type 2 Diabetes Mellitus, Optimally controlled,  Without complications - Most recent A1c of 6.8 %. Goal A1c < 7.0 %.     - Congratulated the pt on optimal glycemic control.  - We counseled about low carb snacking - I offered to switch glipizide to 5 mg but she is ok with the 10 mg for now  -   MEDICATIONS: - Glipizide 10 mg,  Half a  tablet before Breakfast and half a tablet before Supper - Continue Metformin 850 mg Twice  daily  - Continue  Trulicity 1.5 mg weekly    EDUCATION / INSTRUCTIONS:  BG monitoring instructions: Patient is instructed to check her blood sugars 2 times a day, fasting and supper   Call [facility] if: BG persistently < 70 or > 300.  I reviewed the Rule of 15 for the treatment of hypoglycemia in detail with the patient. Literature supplied.    F/U in 6 months    Signed electronically by: Mack Guise, MD  [facility] End[facility]  [facility] [address] [place], [place] [address] Phone: [phone] FAX: [phone]   CC: [address] [place] [address] Phone: [phone]  Fax: [phone]  Return to Endocrinology clinic as below: No future appointments.

## 2019-07-19 NOTE — Patient Instructions (Signed)
-   Glipizide 10 mg HALF a  tablet before Breakfast and half a tablet before Supper - Continue Metformin 850 mg Twice daily  - Continue  Trulicity 1.5 mg weekly          HOW TO TREAT LOW BLOOD SUGARS (Blood sugar LESS THAN 70 MG/DL)  Please follow the RULE OF 15 for the treatment of hypoglycemia treatment (when your (blood sugars are less than 70 mg/dL)    STEP 1: Take 15 grams of carbohydrates when your blood sugar is low, which includes:   3-4 GLUCOSE TABS  OR  3-4 OZ OF JUICE OR REGULAR SODA OR  ONE TUBE OF GLUCOSE GEL     STEP 2: RECHECK blood sugar in 15 MINUTES STEP 3: If your blood sugar is still low at the 15 minute recheck --> then, go back to STEP 1 and treat AGAIN with another 15 grams of carbohydrates.

## 2019-07-19 NOTE — Telephone Encounter (Signed)
Alprazolam refill.   Last OV: 04/18/2019 Last Fill: 06/21/2019 #30 and 0RF Pt sig: 1 tab tid prn UDS: 04/18/2019 Low risk

## 2019-07-26 MED FILL — METFORMIN HCL 850 MG TABS: 850 | 30 days supply | Qty: 60 | Fill #3

## 2019-08-12 ENCOUNTER — Other Ambulatory Visit: Payer: Self-pay | Admitting: Family Medicine

## 2019-08-12 DIAGNOSIS — F411 Generalized anxiety disorder: Secondary | ICD-10-CM

## 2019-08-12 DIAGNOSIS — I10 Essential (primary) hypertension: Secondary | ICD-10-CM

## 2019-08-12 MED FILL — LOSARTAN POTASSIUM 25 MG TA: 25 | 90 days supply | Qty: 90 | Fill #0

## 2019-08-12 MED FILL — TRULICITY 1.5 MG/0.5 ML PEN: 1.5 | 28 days supply | Qty: 2 | Fill #4

## 2019-08-16 ENCOUNTER — Other Ambulatory Visit: Payer: Self-pay

## 2019-08-16 ENCOUNTER — Other Ambulatory Visit: Payer: Self-pay | Admitting: Family Medicine

## 2019-08-16 DIAGNOSIS — I10 Essential (primary) hypertension: Secondary | ICD-10-CM

## 2019-08-16 DIAGNOSIS — F411 Generalized anxiety disorder: Secondary | ICD-10-CM

## 2019-08-16 MED ORDER — ALPRAZOLAM 0.25 MG PO TABS: 0.2500 mg | ORAL_TABLET | Freq: Three times a day (TID) | ORAL | 0 refills | Status: DC | PRN

## 2019-08-16 MED ORDER — LOSARTAN POTASSIUM 25 MG PO TABS: 25.0000 mg | ORAL_TABLET | Freq: Every day | ORAL | 0 refills | Status: DC

## 2019-08-16 MED FILL — ALPRAZolam 0.25 MG TABS: 0.25 | 10 days supply | Qty: 30 | Fill #0

## 2019-08-16 NOTE — Telephone Encounter (Signed)
Alprazolam refill.   Last OV: 04/18/2019 Last Fill: 07/19/2019 #30 and 0RF Pt sig: 1 tab tid prn UDS: 04/18/2019 Low risk

## 2019-08-25 MED FILL — MetFORMIN HCL 850 MG TAB: 850 | 30 days supply | Qty: 60 | Fill #4

## 2019-09-10 ENCOUNTER — Other Ambulatory Visit: Payer: Self-pay | Admitting: Family Medicine

## 2019-09-10 DIAGNOSIS — F411 Generalized anxiety disorder: Secondary | ICD-10-CM

## 2019-09-12 MED ORDER — ALPRAZOLAM 0.25 MG PO TABS: 0.2500 mg | ORAL_TABLET | Freq: Three times a day (TID) | ORAL | 0 refills | Status: DC | PRN

## 2019-09-12 MED FILL — ALPRAZolam 0.25 MG TABS: 0.25 | 10 days supply | Qty: 30 | Fill #0

## 2019-09-12 NOTE — Telephone Encounter (Signed)
Last written: 08/16/19 Last ov: 04/18/19 Next ov: none Contract:  UDS: 04/19/19

## 2019-09-13 MED FILL — TRULICITY 1.5 MG/0.5 ML PEN: 1.5 | 28 days supply | Qty: 2 | Fill #5

## 2019-09-26 MED FILL — MetFORMIN HCL 850 MG TAB: 850 | 30 days supply | Qty: 60 | Fill #5

## 2019-10-17 MED FILL — TRULICITY 1.5 MG/0.5 ML PEN: 1.5 | 28 days supply | Qty: 2 | Fill #6

## 2019-10-17 MED FILL — DULoxetine HCL 60 MG CPEP: 60 | 90 days supply | Qty: 90 | Fill #2

## 2019-10-18 ENCOUNTER — Other Ambulatory Visit (HOSPITAL_COMMUNITY): Payer: Self-pay | Admitting: Internal Medicine

## 2019-10-18 MED FILL — FLUARIX QUADRIVALENT 0.5 ML: 0.5 | 1 days supply | Qty: 1 | Fill #0

## 2019-10-25 ENCOUNTER — Other Ambulatory Visit: Payer: Self-pay | Admitting: Family Medicine

## 2019-10-25 DIAGNOSIS — F411 Generalized anxiety disorder: Secondary | ICD-10-CM

## 2019-10-25 NOTE — Telephone Encounter (Signed)
Requesting:Alprazolam Contract:02/09/2017 needs updated csc UDS:04/19/2019 Last Visit:04/18/2019 Next Visit: none scheduled Last Refill:09/12/2019  Please Advise

## 2019-10-26 ENCOUNTER — Other Ambulatory Visit: Payer: Self-pay | Admitting: Family Medicine

## 2019-10-26 DIAGNOSIS — E1165 Type 2 diabetes mellitus with hyperglycemia: Secondary | ICD-10-CM

## 2019-10-26 MED ORDER — ALPRAZOLAM 0.25 MG PO TABS: 0.2500 mg | ORAL_TABLET | Freq: Three times a day (TID) | ORAL | 0 refills | Status: DC | PRN

## 2019-10-26 MED FILL — ALPRAZolam 0.25 MG TABS: 0.25 | 10 days supply | Qty: 30 | Fill #0

## 2019-10-26 NOTE — Telephone Encounter (Signed)
I will refill but we need updated contract and uds

## 2019-10-27 ENCOUNTER — Other Ambulatory Visit: Payer: Self-pay | Admitting: Internal Medicine

## 2019-10-27 MED FILL — MetFORMIN HCL 850 MG TAB: 850 | 90 days supply | Qty: 180 | Fill #0

## 2019-10-27 NOTE — Telephone Encounter (Signed)
Last seen 03/2019

## 2019-10-28 ENCOUNTER — Other Ambulatory Visit: Payer: Self-pay | Admitting: Internal Medicine

## 2019-10-28 ENCOUNTER — Other Ambulatory Visit: Payer: Self-pay

## 2019-10-28 DIAGNOSIS — E1165 Type 2 diabetes mellitus with hyperglycemia: Secondary | ICD-10-CM

## 2019-10-28 MED ORDER — GLIPIZIDE 5 MG PO TABS: 5.0000 mg | ORAL_TABLET | Freq: Two times a day (BID) | ORAL | 0 refills | Status: DC

## 2019-10-28 MED FILL — glipiZIDE 5 MG TABS: 5 | 15 days supply | Qty: 30 | Fill #0

## 2019-11-14 ENCOUNTER — Other Ambulatory Visit: Payer: Self-pay | Admitting: Family Medicine

## 2019-11-14 DIAGNOSIS — I10 Essential (primary) hypertension: Secondary | ICD-10-CM

## 2019-11-14 MED ORDER — LOSARTAN POTASSIUM 25 MG PO TABS: 25.0000 mg | ORAL_TABLET | Freq: Every day | ORAL | 0 refills | Status: DC

## 2019-11-14 MED FILL — LOSARTAN POTASSIUM 25 MG TA: 25 | 30 days supply | Qty: 30 | Fill #0

## 2019-11-14 MED FILL — TRULICITY 1.5 MG/0.5 ML PEN: 1.5 | 28 days supply | Qty: 2 | Fill #7

## 2019-11-22 ENCOUNTER — Other Ambulatory Visit: Payer: Self-pay | Admitting: Family Medicine

## 2019-11-22 ENCOUNTER — Other Ambulatory Visit: Payer: Self-pay | Admitting: Internal Medicine

## 2019-11-22 DIAGNOSIS — F411 Generalized anxiety disorder: Secondary | ICD-10-CM

## 2019-11-22 DIAGNOSIS — E1165 Type 2 diabetes mellitus with hyperglycemia: Secondary | ICD-10-CM

## 2019-11-23 ENCOUNTER — Other Ambulatory Visit: Payer: Self-pay | Admitting: Endocrinology

## 2019-11-23 MED ORDER — GLIPIZIDE 5 MG PO TABS: 5.0000 mg | ORAL_TABLET | Freq: Two times a day (BID) | ORAL | 1 refills | Status: DC

## 2019-11-23 MED ORDER — ALPRAZOLAM 0.25 MG PO TABS: 0.2500 mg | ORAL_TABLET | Freq: Three times a day (TID) | ORAL | 0 refills | Status: DC | PRN

## 2019-11-23 MED FILL — glipiZIDE 5 MG TABS: 5 | 30 days supply | Qty: 60 | Fill #0

## 2019-11-23 MED FILL — ALPRAZolam 0.25 MG TABS: 0.25 | 10 days supply | Qty: 30 | Fill #0

## 2019-11-23 NOTE — Telephone Encounter (Addendum)
Requesting:Alprazolam Contract:02/09/2017 UDS:04/19/2019 Last Visit:04/18/2019 Next Visit:none scheduled Last Refill:10/26/2019  Please Advise

## 2019-12-20 ENCOUNTER — Other Ambulatory Visit: Payer: Self-pay | Admitting: Family Medicine

## 2019-12-20 DIAGNOSIS — I10 Essential (primary) hypertension: Secondary | ICD-10-CM

## 2019-12-20 MED ORDER — LOSARTAN POTASSIUM 25 MG PO TABS: 25.0000 mg | ORAL_TABLET | Freq: Every day | ORAL | 0 refills | Status: DC

## 2019-12-20 MED FILL — LOSARTAN POTASSIUM 25 MG TA: 25 | 15 days supply | Qty: 15 | Fill #0

## 2019-12-22 MED FILL — TRULICITY 1.5 MG/0.5 ML PEN: 1.5 | 28 days supply | Qty: 2 | Fill #8

## 2019-12-27 ENCOUNTER — Ambulatory Visit: Payer: 59 | Admitting: Family Medicine

## 2019-12-27 DIAGNOSIS — Z0289 Encounter for other administrative examinations: Secondary | ICD-10-CM

## 2019-12-29 ENCOUNTER — Encounter: Payer: Self-pay | Admitting: Family Medicine

## 2019-12-29 ENCOUNTER — Ambulatory Visit: Payer: 59 | Admitting: Family Medicine

## 2019-12-29 ENCOUNTER — Other Ambulatory Visit: Payer: Self-pay

## 2019-12-29 ENCOUNTER — Other Ambulatory Visit: Payer: Self-pay | Admitting: Family Medicine

## 2019-12-29 DIAGNOSIS — E118 Type 2 diabetes mellitus with unspecified complications: Secondary | ICD-10-CM

## 2019-12-29 DIAGNOSIS — F411 Generalized anxiety disorder: Secondary | ICD-10-CM

## 2019-12-29 DIAGNOSIS — E785 Hyperlipidemia, unspecified: Secondary | ICD-10-CM | POA: Diagnosis not present

## 2019-12-29 DIAGNOSIS — IMO0002 Reserved for concepts with insufficient information to code with codable children: Secondary | ICD-10-CM

## 2019-12-29 DIAGNOSIS — E1165 Type 2 diabetes mellitus with hyperglycemia: Secondary | ICD-10-CM

## 2019-12-29 DIAGNOSIS — I10 Essential (primary) hypertension: Secondary | ICD-10-CM

## 2019-12-29 LAB — COMPREHENSIVE METABOLIC PANEL
ALT: 12 U/L (ref 0–35)
AST: 11 U/L (ref 0–37)
Albumin: 4.1 g/dL (ref 3.5–5.2)
Alkaline Phosphatase: 108 U/L (ref 39–117)
BUN: 9 mg/dL (ref 6–23)
CO2: 26 mEq/L (ref 19–32)
Calcium: 9.9 mg/dL (ref 8.4–10.5)
Chloride: 101 mEq/L (ref 96–112)
Creatinine, Ser: 0.72 mg/dL (ref 0.40–1.20)
GFR: 100.1 mL/min (ref >60.00)
Glucose, Bld: 162 mg/dL — ABNORMAL HIGH (ref 70–99)
Potassium: 4.3 mEq/L (ref 3.5–5.1)
Sodium: 135 mEq/L (ref 135–145)
Total Bilirubin: 0.2 mg/dL (ref 0.2–1.2)
Total Protein: 7.7 g/dL (ref 6.0–8.3)

## 2019-12-29 LAB — LIPID PANEL
Cholesterol: 212 mg/dL — ABNORMAL HIGH (ref 0–200)
HDL: 46.3 mg/dL (ref >39.00)
LDL Cholesterol: 130 mg/dL — ABNORMAL HIGH (ref 0–99)
NonHDL: 165.35
Total CHOL/HDL Ratio: 5
Triglycerides: 179 mg/dL — ABNORMAL HIGH (ref 0.0–149.0)
VLDL: 35.8 mg/dL (ref 0.0–40.0)

## 2019-12-29 MED ORDER — LOSARTAN POTASSIUM 25 MG PO TABS: 25.0000 mg | ORAL_TABLET | Freq: Every day | ORAL | 1 refills | Status: DC

## 2019-12-29 MED ORDER — ALPRAZOLAM 0.25 MG PO TABS: 0.2500 mg | ORAL_TABLET | Freq: Three times a day (TID) | ORAL | 1 refills | Status: DC | PRN

## 2019-12-29 MED FILL — ALPRAZolam 0.25 MG TABS: 0.25 | 10 days supply | Qty: 30 | Fill #0

## 2019-12-29 NOTE — Assessment & Plan Note (Signed)
Per endo °

## 2019-12-29 NOTE — Patient Instructions (Signed)
DASH Eating Plan DASH stands for "Dietary Approaches to Stop Hypertension." The DASH eating plan is a healthy eating plan that has been shown to reduce high blood pressure (hypertension). It may also reduce your risk for type 2 diabetes, heart disease, and stroke. The DASH eating plan may also help with weight loss. What are tips for following this plan?  General guidelines  Avoid eating more than 2,300 mg (milligrams) of salt (sodium) a day. If you have hypertension, you may need to reduce your sodium intake to 1,500 mg a day.  Limit alcohol intake to no more than 1 drink a day for nonpregnant women and 2 drinks a day for men. One drink equals 12 oz of beer, 5 oz of wine, or 1 oz of hard liquor.  Work with your health care provider to maintain a healthy body weight or to lose weight. Ask what an ideal weight is for you.  Get at least 30 minutes of exercise that causes your heart to beat faster (aerobic exercise) most days of the week. Activities may include walking, swimming, or biking.  Work with your health care provider or diet and nutrition specialist (dietitian) to adjust your eating plan to your individual calorie needs. Reading food labels   Check food labels for the amount of sodium per serving. Choose foods with less than 5 percent of the Daily Value of sodium. Generally, foods with less than 300 mg of sodium per serving fit into this eating plan.  To find whole grains, look for the word "whole" as the first word in the ingredient list. Shopping  Buy products labeled as "low-sodium" or "no salt added."  Buy fresh foods. Avoid canned foods and premade or frozen meals. Cooking  Avoid adding salt when cooking. Use salt-free seasonings or herbs instead of table salt or sea salt. Check with your health care provider or pharmacist before using salt substitutes.  Do not fry foods. Cook foods using healthy methods such as baking, boiling, grilling, and broiling instead.  Cook with  heart-healthy oils, such as olive, canola, soybean, or sunflower oil. Meal planning  Eat a balanced diet that includes: ? 5 or more servings of fruits and vegetables each day. At each meal, try to fill half of your plate with fruits and vegetables. ? Up to 6-8 servings of whole grains each day. ? Less than 6 oz of lean meat, poultry, or fish each day. A 3-oz serving of meat is about the same size as a deck of cards. One egg equals 1 oz. ? 2 servings of low-fat dairy each day. ? A serving of nuts, seeds, or beans 5 times each week. ? Heart-healthy fats. Healthy fats called Omega-3 fatty acids are found in foods such as flaxseeds and coldwater fish, like sardines, salmon, and mackerel.  Limit how much you eat of the following: ? Canned or prepackaged foods. ? Food that is high in trans fat, such as fried foods. ? Food that is high in saturated fat, such as fatty meat. ? Sweets, desserts, sugary drinks, and other foods with added sugar. ? Full-fat dairy products.  Do not salt foods before eating.  Try to eat at least 2 vegetarian meals each week.  Eat more home-cooked food and less restaurant, buffet, and fast food.  When eating at a restaurant, ask that your food be prepared with less salt or no salt, if possible. What foods are recommended? The items listed may not be a complete list. Talk with your dietitian about   what dietary choices are best for you. Grains Whole-grain or whole-wheat bread. Whole-grain or whole-wheat pasta. Brown rice. Oatmeal. Quinoa. Bulgur. Whole-grain and low-sodium cereals. Pita bread. Low-fat, low-sodium crackers. Whole-wheat flour tortillas. Vegetables Fresh or frozen vegetables (raw, steamed, roasted, or grilled). Low-sodium or reduced-sodium tomato and vegetable juice. Low-sodium or reduced-sodium tomato sauce and tomato paste. Low-sodium or reduced-sodium canned vegetables. Fruits All fresh, dried, or frozen fruit. Canned fruit in natural juice (without  added sugar). Meat and other protein foods Skinless chicken or turkey. Ground chicken or turkey. Pork with fat trimmed off. Fish and seafood. Egg whites. Dried beans, peas, or lentils. Unsalted nuts, nut butters, and seeds. Unsalted canned beans. Lean cuts of beef with fat trimmed off. Low-sodium, lean deli meat. Dairy Low-fat (1%) or fat-free (skim) milk. Fat-free, low-fat, or reduced-fat cheeses. Nonfat, low-sodium ricotta or cottage cheese. Low-fat or nonfat yogurt. Low-fat, low-sodium cheese. Fats and oils Soft margarine without trans fats. Vegetable oil. Low-fat, reduced-fat, or light mayonnaise and salad dressings (reduced-sodium). Canola, safflower, olive, soybean, and sunflower oils. Avocado. Seasoning and other foods Herbs. Spices. Seasoning mixes without salt. Unsalted popcorn and pretzels. Fat-free sweets. What foods are not recommended? The items listed may not be a complete list. Talk with your dietitian about what dietary choices are best for you. Grains Baked goods made with fat, such as croissants, muffins, or some breads. Dry pasta or rice meal packs. Vegetables Creamed or fried vegetables. Vegetables in a cheese sauce. Regular canned vegetables (not low-sodium or reduced-sodium). Regular canned tomato sauce and paste (not low-sodium or reduced-sodium). Regular tomato and vegetable juice (not low-sodium or reduced-sodium). Pickles. Olives. Fruits Canned fruit in a light or heavy syrup. Fried fruit. Fruit in cream or butter sauce. Meat and other protein foods Fatty cuts of meat. Ribs. Fried meat. Bacon. Sausage. Bologna and other processed lunch meats. Salami. Fatback. Hotdogs. Bratwurst. Salted nuts and seeds. Canned beans with added salt. Canned or smoked fish. Whole eggs or egg yolks. Chicken or turkey with skin. Dairy Whole or 2% milk, cream, and half-and-half. Whole or full-fat cream cheese. Whole-fat or sweetened yogurt. Full-fat cheese. Nondairy creamers. Whipped toppings.  Processed cheese and cheese spreads. Fats and oils Butter. Stick margarine. Lard. Shortening. Ghee. Bacon fat. Tropical oils, such as coconut, palm kernel, or palm oil. Seasoning and other foods Salted popcorn and pretzels. Onion salt, garlic salt, seasoned salt, table salt, and sea salt. Worcestershire sauce. Tartar sauce. Barbecue sauce. Teriyaki sauce. Soy sauce, including reduced-sodium. Steak sauce. Canned and packaged gravies. Fish sauce. Oyster sauce. Cocktail sauce. Horseradish that you find on the shelf. Ketchup. Mustard. Meat flavorings and tenderizers. Bouillon cubes. Hot sauce and Tabasco sauce. Premade or packaged marinades. Premade or packaged taco seasonings. Relishes. Regular salad dressings. Where to find more information:  National Heart, Lung, and Blood Institute: www.nhlbi.nih.gov  American Heart Association: www.heart.org Summary  The DASH eating plan is a healthy eating plan that has been shown to reduce high blood pressure (hypertension). It may also reduce your risk for type 2 diabetes, heart disease, and stroke.  With the DASH eating plan, you should limit salt (sodium) intake to 2,300 mg a day. If you have hypertension, you may need to reduce your sodium intake to 1,500 mg a day.  When on the DASH eating plan, aim to eat more fresh fruits and vegetables, whole grains, lean proteins, low-fat dairy, and heart-healthy fats.  Work with your health care provider or diet and nutrition specialist (dietitian) to adjust your eating plan to your   individual calorie needs. This information is not intended to replace advice given to you by your health care provider. Make sure you discuss any questions you have with your health care provider. Document Revised: 12/12/2016 Document Reviewed: 12/24/2015 Elsevier Patient Education  2020 [employer]  

## 2019-12-29 NOTE — Progress Notes (Signed)
Patient ID: Cheryl Hart, female    DOB: 12-19-1975  Age: 44 y.o. MRN: [id number]    Subjective:  Subjective  HPI Cheryl Hart presents for f/u bp   No complaints  Review of Systems  Constitutional: Negative for appetite change, diaphoresis, fatigue and unexpected weight change.  Eyes: Negative for pain, redness and visual disturbance.  Respiratory: Negative for cough, chest tightness, shortness of breath and wheezing.   Cardiovascular: Negative for chest pain, palpitations and leg swelling.  Endocrine: Negative for cold intolerance, heat intolerance, polydipsia, polyphagia and polyuria.  Genitourinary: Negative for difficulty urinating, dysuria and frequency.  Neurological: Negative for dizziness, light-headedness, numbness and headaches.    History Past Medical History:  Diagnosis Date  . Anemia   . Anxiety   . Depression   . Diabetes mellitus   . Hyperlipidemia   . Hypertension   . PCOS (polycystic ovarian syndrome)   . Uterine fibroid   . Vitamin B12 deficiency     She has a past surgical history that includes Cesarean section; g1 p1; Cholecystectomy; and Laparoscopic gelport assisted myomectomy (02/12/2015).   Her family history includes Alcoholism in her father; Anxiety disorder in her father and mother; Depression in her father; Diabetes in her brother, father, and mother; Drug abuse in her father; Hyperlipidemia in her mother; Hypertension in her father and mother; Kidney disease in her mother; Liver disease in her father; Migraines in an other family member; Thyroid disease in her mother.She reports that she has never smoked. She has never used smokeless tobacco. She reports that she does not drink alcohol and does not use drugs.  Current Outpatient Medications on File Prior to Visit  Medication Sig Dispense Refill  . Biotin 5000 MCG TABS Take 5,000 mcg by mouth daily.     . Blood Glucose Monitoring Suppl (CONTOUR NEXT ONE) KIT 1 kit by Does not apply route daily. 1  kit 0  . Comfort Lancets MISC Use as directed to test blood sugar 2 times daily E11.9 100 each 6  . diphenhydrAMINE (BENADRYL) 25 MG tablet Take 50 mg by mouth at bedtime as needed for sleep.     . Dulaglutide (TRULICITY) 1.5 DJ/2.4QA SOPN Inject 1.5 mg into the skin once a week. 4 pen 11  . DULoxetine (CYMBALTA) 60 MG capsule TAKE 1 CAPSULE BY MOUTH ONCE DAILY 90 capsule 3  . glipiZIDE (GLUCOTROL) 5 MG tablet Take 1 tablet (5 mg total) by mouth 2 (two) times daily before a meal. 60 tablet 1  . glucose blood (CONTOUR NEXT TEST) test strip Use as instructed to test blood sugar 2 times daily E11.9 100 each 12  . glucose blood test strip Use twice a day 100 each 2  . ibuprofen (ADVIL) 600 MG tablet Take 1 tablet (600 mg total) by mouth every 6 (six) hours as needed. 30 tablet 2  . Lancets (ONETOUCH ULTRASOFT) lancets Use to check blood sugars 2 times day 100 each 3  . metFORMIN (GLUCOPHAGE) 850 MG tablet Take 1 tablet (850 mg total) by mouth 2 (two) times daily with a meal. 180 tablet 3  . Microlet Lancets MISC Use as instructed to test blood sugar 2 times daily E11.9 100 each 6  . Multiple Vitamin (MULTIVITAMIN WITH MINERALS) TABS tablet Take 1 tablet by mouth daily.    . simvastatin (ZOCOR) 40 MG tablet Take 1 tablet (40 mg total) by mouth at bedtime. 30 tablet 2  . traZODone (DESYREL) 50 MG tablet Take 0.5-1 tablets (25-50 mg total) by  mouth at bedtime as needed for sleep. 30 tablet 3  . Vitamin D, Ergocalciferol, (DRISDOL) 1.25 MG (50000 UNIT) CAPS capsule Take 1 capsule (50,000 Units total) by mouth every 7 (seven) days. 4 capsule 0   No current facility-administered medications on file prior to visit.     Objective:  Objective  Physical Exam Vitals and nursing note reviewed.  Constitutional:      Appearance: She is well-developed and well-nourished.  HENT:     Head: Normocephalic and atraumatic.  Eyes:     Extraocular Movements: EOM normal.     Conjunctiva/sclera: Conjunctivae  normal.  Neck:     Thyroid: No thyromegaly.     Vascular: No carotid bruit or JVD.  Cardiovascular:     Rate and Rhythm: Normal rate and regular rhythm.     Heart sounds: Normal heart sounds. No murmur heard.   Pulmonary:     Effort: Pulmonary effort is normal. No respiratory distress.     Breath sounds: Normal breath sounds. No wheezing or rales.  Chest:     Chest wall: No tenderness.  Musculoskeletal:        General: No edema.     Cervical back: Normal range of motion and neck supple.  Neurological:     Mental Status: She is alert and oriented to person, place, and time.  Psychiatric:        Mood and Affect: Mood and affect normal.    BP 110/74 (BP Location: Right Arm, Patient Position: Sitting, Cuff Size: Large)   Pulse (!) 118   Temp 98.1 F (36.7 C) (Oral)   Resp 18   Ht 5\' 6"  (1.676 m)   Wt 205 lb 6.4 oz (93.2 kg)   SpO2 98%   BMI 33.15 kg/m  Wt Readings from Last 3 Encounters:  12/29/19 205 lb 6.4 oz (93.2 kg)  07/19/19 205 lb 6.4 oz (93.2 kg)  06/02/19 201 lb (91.2 kg)     Lab Results  Component Value Date   WBC 6.7 02/09/2019   HGB 11.3 02/09/2019   HCT 35.0 02/09/2019   PLT 407 02/09/2019   GLUCOSE 145 (H) 02/09/2019   CHOL 161 02/09/2019   TRIG 144 02/09/2019   HDL 41 02/09/2019   LDLDIRECT 92.0 02/01/2015   LDLCALC 95 02/09/2019   ALT 21 02/09/2019   AST 18 02/09/2019   NA 138 02/09/2019   K 4.6 02/09/2019   CL 101 02/09/2019   CREATININE 0.64 02/09/2019   BUN 14 02/09/2019   CO2 24 02/09/2019   TSH 1.470 02/09/2019   INR 1.12 02/12/2015   HGBA1C 6.8 (A) 07/19/2019   MICROALBUR <0.7 10/01/2018    No results found.   Assessment & Plan:  Plan  I have discontinued Cheryl Hart's blood glucose meter kit and supplies, Contour Next Monitor, and naproxen. I am also having her maintain her multivitamin with minerals, Biotin, diphenhydrAMINE, ibuprofen, simvastatin, glucose blood, onetouch ultrasoft, Contour Next One, Contour Next Test,  Comfort Lancets, Microlet Lancets, traZODone, Trulicity, Vitamin D (Ergocalciferol), DULoxetine, metFORMIN, glipiZIDE, losartan, and ALPRAZolam.  Meds ordered this encounter  Medications  . losartan (COZAAR) 25 MG tablet    Sig: Take 1 tablet (25 mg total) by mouth daily.    Dispense:  90 tablet    Refill:  1  . ALPRAZolam (XANAX) 0.25 MG tablet    Sig: Take 1 tablet (0.25 mg total) by mouth 3 (three) times daily as needed.    Dispense:  30 tablet    Refill:  1    Problem List Items Addressed This Visit      Unprioritized   Generalized anxiety disorder   Relevant Medications   ALPRAZolam (XANAX) 0.25 MG tablet   HTN (hypertension)    Well controlled, no changes to meds. Encouraged heart healthy diet such as the DASH diet and exercise as tolerated.       Relevant Medications   losartan (COZAAR) 25 MG tablet   Hyperlipidemia    Encouraged heart healthy diet, increase exercise, avoid trans fats, consider a krill oil cap daily      Relevant Medications   losartan (COZAAR) 25 MG tablet   Uncontrolled type 2 diabetes mellitus with complication, without long-term current use of insulin (HCC)    Per endo      Relevant Medications   losartan (COZAAR) 25 MG tablet    Other Visit Diagnoses    Essential hypertension       Relevant Medications   losartan (COZAAR) 25 MG tablet   Other Relevant Orders   Lipid panel   Comprehensive metabolic panel      Follow-up: Return in about 6 months (around 06/28/2020), or if symptoms worsen or fail to improve, for annual exam, fasting.  Ann Held, DO

## 2019-12-29 NOTE — Assessment & Plan Note (Signed)
Encouraged heart healthy diet, increase exercise, avoid trans fats, consider a krill oil cap daily 

## 2019-12-29 NOTE — Assessment & Plan Note (Signed)
Well controlled, no changes to meds. Encouraged heart healthy diet such as the DASH diet and exercise as tolerated.  °

## 2020-01-02 ENCOUNTER — Other Ambulatory Visit: Payer: Self-pay | Admitting: Family Medicine

## 2020-01-02 DIAGNOSIS — E785 Hyperlipidemia, unspecified: Secondary | ICD-10-CM

## 2020-01-02 DIAGNOSIS — E1169 Type 2 diabetes mellitus with other specified complication: Secondary | ICD-10-CM

## 2020-01-02 MED FILL — LOSARTAN POTASSIUM 25 MG TA: 25 | 90 days supply | Qty: 90 | Fill #0

## 2020-01-03 ENCOUNTER — Other Ambulatory Visit: Payer: Self-pay

## 2020-01-03 DIAGNOSIS — E1169 Type 2 diabetes mellitus with other specified complication: Secondary | ICD-10-CM

## 2020-01-03 DIAGNOSIS — E785 Hyperlipidemia, unspecified: Secondary | ICD-10-CM

## 2020-01-03 MED ORDER — ROSUVASTATIN CALCIUM 20 MG PO TABS: 20.0000 mg | ORAL_TABLET | Freq: Every day | ORAL | 1 refills | Status: DC

## 2020-01-03 MED FILL — ROSUVASTATIN CALCIUM 20 MG: 20 | 30 days supply | Qty: 30 | Fill #0

## 2020-01-03 NOTE — Progress Notes (Signed)
Pt scheduled and new Rx sent to pharmacy.

## 2020-01-12 MED FILL — ALPRAZolam 0.25 MG TABS: 0.25 | 10 days supply | Qty: 30 | Fill #1

## 2020-01-19 MED FILL — DULoxetine HCL 60 MG CPEP: 60 | 90 days supply | Qty: 90 | Fill #3

## 2020-01-19 MED FILL — TRULICITY 1.5 MG/0.5 ML PEN: 1.5 | 28 days supply | Qty: 2 | Fill #9

## 2020-01-19 MED FILL — glipiZIDE 5 MG TABS: 5 | 30 days supply | Qty: 60 | Fill #1

## 2020-01-26 MED FILL — MetFORMIN HCL 850 MG TAB: 850 | 90 days supply | Qty: 180 | Fill #1

## 2020-02-06 ENCOUNTER — Other Ambulatory Visit (HOSPITAL_COMMUNITY): Payer: Self-pay | Admitting: Optometry

## 2020-02-06 MED FILL — FLUCONAZOLE 150 MG TABS: 150 | 1 days supply | Qty: 1 | Fill #0

## 2020-02-06 MED FILL — ERYTHROMYCIN EYE OINTMENT: 5 | 7 days supply | Qty: 4 | Fill #0

## 2020-02-06 MED FILL — PREDNISOLONE AC 1% EYE DROP: 1 | 19 days supply | Qty: 5 | Fill #0

## 2020-02-06 MED FILL — ACYCLOVIR 800 MG TABLET: 800 | 7 days supply | Qty: 35 | Fill #0

## 2020-02-06 MED FILL — methylPREDNISolone 4 MG dos: 4 | 6 days supply | Qty: 21 | Fill #0

## 2020-02-07 ENCOUNTER — Other Ambulatory Visit: Payer: Self-pay | Admitting: Family Medicine

## 2020-02-07 DIAGNOSIS — F411 Generalized anxiety disorder: Secondary | ICD-10-CM

## 2020-02-07 MED FILL — ALPRAZolam 0.25 MG TABS: 0.25 | 10 days supply | Qty: 30 | Fill #0

## 2020-02-07 NOTE — Telephone Encounter (Signed)
Requesting: alprazolam 0.25mg  Contract: 04/18/2019 UDS: 04/18/2019 Last Visit: 12/29/2019 Next Visit: 06/28/2020 Last Refill: 12/29/2019 #30 and 1RF Pt sig: 1 tab tid prn  Please Advise

## 2020-02-20 MED FILL — TRULICITY 1.5 MG/0.5 ML PEN: 1.5 | 28 days supply | Qty: 2 | Fill #10

## 2020-02-20 MED FILL — ROSUVASTATIN CALCIUM 20 MG: 20 | 30 days supply | Qty: 30 | Fill #1

## 2020-03-05 MED FILL — ALPRAZolam 0.25 MG TABS: 0.25 | 10 days supply | Qty: 30 | Fill #1

## 2020-03-13 ENCOUNTER — Other Ambulatory Visit: Payer: 59

## 2020-03-19 ENCOUNTER — Other Ambulatory Visit: Payer: Self-pay | Admitting: Internal Medicine

## 2020-03-19 ENCOUNTER — Other Ambulatory Visit: Payer: 59

## 2020-03-19 ENCOUNTER — Telehealth: Payer: Self-pay | Admitting: Internal Medicine

## 2020-03-19 MED ORDER — TRULICITY 1.5 MG/0.5ML ~~LOC~~ SOAJ: 1.5000 mg | SUBCUTANEOUS | 0 refills | Status: DC

## 2020-03-19 MED FILL — TRULICITY 1.5 MG/0.5 ML PEN: 1.5 | 28 days supply | Qty: 2 | Fill #0

## 2020-03-19 NOTE — Telephone Encounter (Signed)
Pt scheduled appt for 04/13/20 at 55:00TU to get her trulicity filled

## 2020-03-19 NOTE — Telephone Encounter (Addendum)
Refill sent.

## 2020-03-19 NOTE — Addendum Note (Signed)
Addended by: Jacqualin Combes on: 03/19/2020 02:49 PM   Modules accepted: Orders

## 2020-03-27 ENCOUNTER — Other Ambulatory Visit: Payer: Self-pay | Admitting: Family Medicine

## 2020-03-27 ENCOUNTER — Other Ambulatory Visit: Payer: Self-pay | Admitting: Endocrinology

## 2020-03-27 DIAGNOSIS — E1165 Type 2 diabetes mellitus with hyperglycemia: Secondary | ICD-10-CM

## 2020-03-27 DIAGNOSIS — F411 Generalized anxiety disorder: Secondary | ICD-10-CM

## 2020-03-27 NOTE — Telephone Encounter (Signed)
Requesting: Xanax Contract: 04/18/19 UDS: 04/18/19 Last Visit: 12/29/19 Next Visit: 06/28/20 Last Refill: 02/07/20  Please Advise

## 2020-03-28 ENCOUNTER — Other Ambulatory Visit: Payer: Self-pay | Admitting: Internal Medicine

## 2020-03-30 ENCOUNTER — Other Ambulatory Visit: Payer: 59

## 2020-04-13 ENCOUNTER — Ambulatory Visit (INDEPENDENT_AMBULATORY_CARE_PROVIDER_SITE_OTHER): Payer: 59 | Admitting: Internal Medicine

## 2020-04-13 ENCOUNTER — Other Ambulatory Visit: Payer: Self-pay

## 2020-04-13 ENCOUNTER — Other Ambulatory Visit: Payer: Self-pay | Admitting: Internal Medicine

## 2020-04-13 ENCOUNTER — Encounter: Payer: Self-pay | Admitting: Internal Medicine

## 2020-04-13 VITALS — BP 130/80 | HR 110 | Ht 66.0 in | Wt 205.0 lb

## 2020-04-13 DIAGNOSIS — E1165 Type 2 diabetes mellitus with hyperglycemia: Secondary | ICD-10-CM | POA: Diagnosis not present

## 2020-04-13 DIAGNOSIS — M79674 Pain in right toe(s): Secondary | ICD-10-CM | POA: Diagnosis not present

## 2020-04-13 DIAGNOSIS — E118 Type 2 diabetes mellitus with unspecified complications: Secondary | ICD-10-CM

## 2020-04-13 DIAGNOSIS — IMO0002 Reserved for concepts with insufficient information to code with codable children: Secondary | ICD-10-CM

## 2020-04-13 DIAGNOSIS — M79675 Pain in left toe(s): Secondary | ICD-10-CM | POA: Diagnosis not present

## 2020-04-13 LAB — POCT GLUCOSE (DEVICE FOR HOME USE): POC Glucose: 157 mg/dl — AB (ref 70–99)

## 2020-04-13 LAB — POCT GLYCOSYLATED HEMOGLOBIN (HGB A1C): Hemoglobin A1C: 7.1 % — AB (ref 4.0–5.6)

## 2020-04-13 MED ORDER — TRULICITY 1.5 MG/0.5ML ~~LOC~~ SOAJ: 1.5000 mg | SUBCUTANEOUS | 3 refills | Status: DC

## 2020-04-13 MED ORDER — ONETOUCH VERIO VI STRP: 1.0000 | ORAL_STRIP | Freq: Two times a day (BID) | 12 refills | Status: DC

## 2020-04-13 MED ORDER — TRULICITY 1.5 MG/0.5ML ~~LOC~~ SOAJ
1.5000 mg | SUBCUTANEOUS | 3 refills | Status: DC
  Filled 2020-04-13: qty 2, 28d supply, fill #0
  Filled 2020-05-12: qty 6, 84d supply, fill #1

## 2020-04-13 MED ORDER — GLIPIZIDE 5 MG PO TABS: 7.5000 mg | ORAL_TABLET | Freq: Two times a day (BID) | ORAL | 2 refills | Status: DC

## 2020-04-13 NOTE — Progress Notes (Signed)
Name: Cheryl Hart  Age/ Sex: 45 y.o., female   MRN/ DOB: [id number], [birth year]     PCP: Carollee Herter, Alferd Apa, DO   Reason for Endocrinology Evaluation: Type 2 Diabetes Mellitus  Initial Endocrine Consultative Visit: 12/01/2018    PATIENT IDENTIFIER: [name] is a 45 y.o. female with a past medical history of HTN, T2DM, and dyslipidemia  . The patient has followed with Endocrinology clinic since 12/01/2018 for consultative assistance with management of her diabetes.  DIABETIC HISTORY:  [name] was diagnosed with DM in 2010. She has tried invokana but didn't;t feel good on it , has been unable to afford Januvia or victoza in the past.  Her hemoglobin A1c has ranged from 7.4% in 2018, peaking at 11.1% in 2019.   On her initial visit to our clinic she had an A1c of 9.0%  , she was on Glimepiride and metformin . 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 We continued  Glipizide and Metformin and Trulicity .  Today (04/13/2020): [name] is here for a f/u on diabetes.She has not been to our clinic in 9 months.   She  Has not checked since January. The patient has not had hypoglycemic episodes since the last clinic visit.   Weight has been stable  Denies nausea or diarrhea    HOME DIABETES REGIMEN:  Glipizide 10 mg. 0.5  tablet before Breakfast and 0.5 tablet before Supper Metformin 850 mg Twice daily  Trulicity 1.5 mg weekly (Monday)     Statin: Yes ACE-I/ARB: yes    METER DOWNLOAD SUMMARY: Did not bring    DIABETIC COMPLICATIONS: Microvascular complications:    Denies: CKD, retinopathy , neuropathy   Last eye exam: Completed 10/2018  Macrovascular complications:    Denies: CAD, PVD, CVA   HISTORY:  Past Medical History:   Past Medical History:  Diagnosis Date  . Anemia   . Anxiety   . Depression   . Diabetes mellitus   . Hyperlipidemia   . Hypertension   . PCOS (polycystic ovarian syndrome)   . Uterine fibroid   . Vitamin B12 deficiency    Past Surgical History:  Past Surgical History:  Procedure Laterality Date  . CESAREAN SECTION    . CHOLECYSTECTOMY    . g1 p1    . LAPAROSCOPIC GELPORT ASSISTED MYOMECTOMY  02/12/2015   Dr Barbie Banner    Social History:  reports that she has never smoked. She has never used smokeless tobacco. She reports that she does not drink alcohol and does not use drugs. Family History:  Family History  Problem Relation Age of Onset  . Migraines Other   . Diabetes Mother   . Hypertension Mother   . Hyperlipidemia Mother   . Kidney disease Mother   . Thyroid disease Mother   . Anxiety disorder Mother   . Diabetes Father   . Hypertension Father   . Anxiety disorder Father   . Liver disease Father   . Alcoholism Father   . Drug abuse Father   . Depression Father   . Diabetes Brother      HOME MEDICATIONS: Allergies as of 04/13/2020      Reactions   Sulfa Antibiotics Other (See Comments), Rash   Sulfonamide Derivatives  Photosensitivity, Rash, Other (See Comments)   Severe headache      Medication List       Accurate as of April 13, 2020 11:26 AM. If you have any questions, ask your nurse or doctor.        ALPRAZolam 0.25 MG tablet Commonly known as: XANAX TAKE 1 TABLET (0.25 MG TOTAL) BY MOUTH 3 (THREE) TIMES DAILY AS NEEDED.   Biotin 5000 MCG Tabs Take 5,000 mcg by mouth daily.   Contour Next One Kit 1 kit by Does not apply route daily.   diphenhydrAMINE 25 MG tablet Commonly known as: BENADRYL Take 50 mg by mouth at bedtime as needed for sleep.   DULoxetine 60 MG capsule Commonly known as: CYMBALTA TAKE 1 CAPSULE BY MOUTH ONCE DAILY   glipiZIDE 5 MG tablet Commonly known as: GLUCOTROL TAKE 1 TABLET (5 MG TOTAL) BY MOUTH 2 (TWO) TIMES DAILY  BEFORE A MEAL.   glucose blood test strip Use twice a day   Contour Next Test test strip Generic drug: glucose blood Use as instructed to test blood sugar 2 times daily E11.9   ibuprofen 600 MG tablet Commonly known as: ADVIL Take 1 tablet (600 mg total) by mouth every 6 (six) hours as needed.   losartan 25 MG tablet Commonly known as: COZAAR Take 1 tablet (25 mg total) by mouth daily.   metFORMIN 850 MG tablet Commonly known as: GLUCOPHAGE Take 1 tablet (850 mg total) by mouth 2 (two) times daily with a meal.   multivitamin with minerals Tabs tablet Take 1 tablet by mouth daily.   onetouch ultrasoft lancets Use to check blood sugars 2 times day   Comfort Lancets Misc Use as directed to test blood sugar 2 times daily E11.9   Microlet Lancets Misc Use as instructed to test blood sugar 2 times daily E11.9   rosuvastatin 20 MG tablet Commonly known as: CRESTOR Take 1 tablet (20 mg total) by mouth daily.   traZODone 50 MG tablet Commonly known as: DESYREL Take 0.5-1 tablets (25-50 mg total) by mouth at bedtime as needed for sleep.   Trulicity 1.5 XU/3.8BF Sopn Generic drug: Dulaglutide Inject 1.5 mg into the skin once a week.   Vitamin D (Ergocalciferol) 1.25 MG (50000 UNIT) Caps capsule Commonly known as: DRISDOL Take 1 capsule (50,000 Units total) by mouth every 7 (seven) days.        OBJECTIVE:   Vital Signs: BP 130/80   Pulse (!) 110   Ht 5' 6"  (1.676 m)   Wt 205 lb (93 kg)   LMP 05/22/2019   SpO2 98%   BMI 33.09 kg/m   Wt Readings from Last 3 Encounters:  04/13/20 205 lb (93 kg)  12/29/19 205 lb 6.4 oz (93.2 kg)  07/19/19 205 lb 6.4 oz (93.2 kg)     Exam: General: Pt appears well and is in NAD  Lungs: Clear with good BS bilat with no rales, rhonchi, or wheezes  Heart: RRR with normal S1 and S2 and no gallops; no murmurs; no rub  Abdomen: Normoactive bowel sounds, soft, nontender, without masses or organomegaly palpable  Extremities: No  pretibial edema.   Neuro: MS is good with appropriate affect, pt is alert and Ox3    DM foot exam:04/13/2020  The skin of the feet is without sores or ulcerations, Right great toe nail is thickened and curved . Left great toe nail is discolored and brittle.  The pedal pulses are 2+ on right and 2+ on left.  The sensation is intact to a screening 5.07, 10 gram monofilament bilaterally    DATA REVIEWED:  Lab Results  Component Value Date   HGBA1C 7.1 (A) 04/13/2020   HGBA1C 6.8 (A) 07/19/2019   HGBA1C 7.4 (A) 03/17/2019   Lab Results  Component Value Date   MICROALBUR <0.7 10/01/2018   LDLCALC 130 (H) 12/29/2019   CREATININE 0.72 12/29/2019   Lab Results  Component Value Date   MICRALBCREAT 1.1 10/01/2018     Lab Results  Component Value Date   CHOL 212 (H) 12/29/2019   HDL 46.30 12/29/2019   LDLCALC 130 (H) 12/29/2019   LDLDIRECT 92.0 02/01/2015   TRIG 179.0 (H) 12/29/2019   CHOLHDL 5 12/29/2019        In-office 17m/dL (fasting )  ASSESSMENT / PLAN / RECOMMENDATIONS:    1) Type 2 Diabetes Mellitus, Sub- Optimally controlled,  Without complications - Most recent A1c of 7.1 %. Goal A1c < 7.0 %.    - A1c slightly increased from 6.8 % to 7.1 %  - She has not been checking glucose, discussed the importance of glucose checks at home, she was provided with  New meter and strips.   - Will increase Glipizide as below   MEDICATIONS: - Increase Glipizide 526m one and a  Half tablet before Breakfast and one and a half a tablet before Supper - Continue Metformin 850 mg Twice daily  - Continue  Trulicity 1.5 mg weekly    EDUCATION / INSTRUCTIONS:  BG monitoring instructions: Patient is instructed to check her blood sugars 2 times a day, fasting and supper   [facility] if: BG persistently < 70  . I reviewed the Rule of 15 for the treatment of hypoglycemia in detail with the patient. Literature supplied.   2) Diabetic complications:   Eye:  Does not have known diabetic retinopathy. Pt urged to have an updated eye exam   Neuro/ Feet: Does not have known diabetic peripheral neuropathy.  Renal: Patient does not have known baseline CKD. She is on an ACEI/ARB at present.   3) Toe pains:  - Examination of the foot is benign, I suspect the great toe pain is due to shoe pressure and lack of space with long toe nails, she was advised to keep them short and consider buying shoes half a size bigger  - As for her left great toe nail, not sure of this is fungus . Pt to try OTC topical anit-fungal polish   F/U in 4 months    Signed electronically by: AbMack GuiseMD  [facility]  [facility] [address]am[place] [address]hone: [phone]: [phone] CC: LoAnn HeldDO [address] [id number]: [phone]: [phone] to Endocrinology clinic as below: Future Appointments  Date Time Provider DeLoraine6/16/2022  9:00 AM LoAnn HeldDO [facility]

## 2020-04-13 NOTE — Addendum Note (Signed)
Addended by: Darlina Rumpf A on: 04/13/2020 05:01 PM   Modules accepted: Orders

## 2020-04-13 NOTE — Patient Instructions (Signed)
-   Increase Glipizide 5  mg to ONE and a HALF tablets before Breakfast and Supper  - Continue Metformin 850 mg Twice daily  - Continue  Trulicity 1.5 mg weekly          HOW TO TREAT LOW BLOOD SUGARS (Blood sugar LESS THAN 70 MG/DL)  Please follow the RULE OF 15 for the treatment of hypoglycemia treatment (when your (blood sugars are less than 70 mg/dL)    STEP 1: Take 15 grams of carbohydrates when your blood sugar is low, which includes:   3-4 GLUCOSE TABS  OR  3-4 OZ OF JUICE OR REGULAR SODA OR  ONE TUBE OF GLUCOSE GEL     STEP 2: RECHECK blood sugar in 15 MINUTES STEP 3: If your blood sugar is still low at the 15 minute recheck --> then, go back to STEP 1 and treat AGAIN with another 15 grams of carbohydrates.

## 2020-04-14 ENCOUNTER — Other Ambulatory Visit (HOSPITAL_COMMUNITY): Payer: Self-pay

## 2020-04-16 ENCOUNTER — Other Ambulatory Visit (HOSPITAL_COMMUNITY): Payer: Self-pay

## 2020-04-16 ENCOUNTER — Other Ambulatory Visit: Payer: Self-pay | Admitting: Family Medicine

## 2020-04-16 DIAGNOSIS — E1165 Type 2 diabetes mellitus with hyperglycemia: Secondary | ICD-10-CM

## 2020-04-16 DIAGNOSIS — E1142 Type 2 diabetes mellitus with diabetic polyneuropathy: Secondary | ICD-10-CM

## 2020-04-16 DIAGNOSIS — IMO0002 Reserved for concepts with insufficient information to code with codable children: Secondary | ICD-10-CM

## 2020-04-17 ENCOUNTER — Other Ambulatory Visit (HOSPITAL_COMMUNITY): Payer: Self-pay

## 2020-04-17 MED ORDER — DULOXETINE HCL 60 MG PO CPEP
60.0000 mg | ORAL_CAPSULE | Freq: Every day | ORAL | 1 refills | Status: DC
  Filled 2020-04-17: qty 90, 90d supply, fill #0
  Filled 2020-07-12: qty 90, 90d supply, fill #1

## 2020-04-25 MED FILL — Alprazolam Tab 0.25 MG: ORAL | 10 days supply | Qty: 30 | Fill #0 | Status: AC

## 2020-04-25 MED FILL — Losartan Potassium Tab 25 MG: ORAL | 60 days supply | Qty: 60 | Fill #0 | Status: AC

## 2020-04-25 MED FILL — Metformin HCl Tab 850 MG: ORAL | 90 days supply | Qty: 180 | Fill #0 | Status: AC

## 2020-04-26 ENCOUNTER — Other Ambulatory Visit (HOSPITAL_COMMUNITY): Payer: Self-pay

## 2020-04-27 ENCOUNTER — Other Ambulatory Visit: Payer: Self-pay | Admitting: Internal Medicine

## 2020-04-27 ENCOUNTER — Other Ambulatory Visit (HOSPITAL_COMMUNITY): Payer: Self-pay

## 2020-04-30 ENCOUNTER — Other Ambulatory Visit: Payer: Self-pay | Admitting: Internal Medicine

## 2020-04-30 ENCOUNTER — Other Ambulatory Visit (HOSPITAL_COMMUNITY): Payer: Self-pay

## 2020-04-30 MED ORDER — BLOOD GLUCOSE MONITOR SYSTEM W/DEVICE KIT
PACK | 0 refills | Status: AC
  Filled 2020-04-30: qty 1, 1d supply, fill #0

## 2020-04-30 MED ORDER — ONETOUCH VERIO VI STRP
ORAL_STRIP | 5 refills | Status: DC
  Filled 2020-04-30: qty 100, 50d supply, fill #0

## 2020-04-30 MED ORDER — FREESTYLE LITE TEST VI STRP
ORAL_STRIP | 5 refills | Status: AC
  Filled 2020-04-30: qty 100, 50d supply, fill #0

## 2020-05-08 ENCOUNTER — Other Ambulatory Visit (HOSPITAL_COMMUNITY): Payer: Self-pay

## 2020-05-12 MED FILL — Glipizide Tab 5 MG: ORAL | 90 days supply | Qty: 270 | Fill #0 | Status: AC

## 2020-05-14 ENCOUNTER — Other Ambulatory Visit (HOSPITAL_COMMUNITY): Payer: Self-pay

## 2020-05-24 ENCOUNTER — Other Ambulatory Visit: Payer: Self-pay | Admitting: Family Medicine

## 2020-05-24 DIAGNOSIS — F411 Generalized anxiety disorder: Secondary | ICD-10-CM

## 2020-05-25 ENCOUNTER — Other Ambulatory Visit (HOSPITAL_COMMUNITY): Payer: Self-pay

## 2020-05-25 MED ORDER — ALPRAZOLAM 0.25 MG PO TABS
0.2500 mg | ORAL_TABLET | Freq: Three times a day (TID) | ORAL | 1 refills | Status: DC | PRN
  Filled 2020-05-25: qty 30, 10d supply, fill #0
  Filled 2020-06-17: qty 30, 10d supply, fill #1

## 2020-05-25 NOTE — Telephone Encounter (Signed)
Requesting: Xanax  Contract: 04/19/2019 UDS: 04/19/2019 Last OV: 12/29/2019 Next OV: 06/28/2020 Last Refill: 03/27/2020, #30--0 RF Database:   Please advise

## 2020-06-18 ENCOUNTER — Other Ambulatory Visit (HOSPITAL_COMMUNITY): Payer: Self-pay

## 2020-06-22 ENCOUNTER — Other Ambulatory Visit: Payer: Self-pay

## 2020-06-25 ENCOUNTER — Other Ambulatory Visit: Payer: Self-pay | Admitting: Family Medicine

## 2020-06-25 ENCOUNTER — Other Ambulatory Visit (HOSPITAL_COMMUNITY): Payer: Self-pay

## 2020-06-25 DIAGNOSIS — I10 Essential (primary) hypertension: Secondary | ICD-10-CM

## 2020-06-25 MED ORDER — LOSARTAN POTASSIUM 25 MG PO TABS
25.0000 mg | ORAL_TABLET | Freq: Every day | ORAL | 0 refills | Status: DC
  Filled 2020-06-25: qty 90, 90d supply, fill #0

## 2020-06-26 ENCOUNTER — Ambulatory Visit: Payer: 59 | Admitting: Podiatry

## 2020-06-28 ENCOUNTER — Encounter: Payer: 59 | Admitting: Family Medicine

## 2020-07-02 ENCOUNTER — Ambulatory Visit: Payer: 59

## 2020-07-02 ENCOUNTER — Other Ambulatory Visit: Payer: Self-pay

## 2020-07-02 ENCOUNTER — Ambulatory Visit (INDEPENDENT_AMBULATORY_CARE_PROVIDER_SITE_OTHER): Payer: 59 | Admitting: Podiatry

## 2020-07-02 ENCOUNTER — Encounter: Payer: Self-pay | Admitting: Podiatry

## 2020-07-02 DIAGNOSIS — L600 Ingrowing nail: Secondary | ICD-10-CM

## 2020-07-02 DIAGNOSIS — L608 Other nail disorders: Secondary | ICD-10-CM | POA: Diagnosis not present

## 2020-07-02 DIAGNOSIS — B351 Tinea unguium: Secondary | ICD-10-CM | POA: Diagnosis not present

## 2020-07-02 DIAGNOSIS — A499 Bacterial infection, unspecified: Secondary | ICD-10-CM | POA: Diagnosis not present

## 2020-07-02 DIAGNOSIS — L603 Nail dystrophy: Secondary | ICD-10-CM

## 2020-07-02 DIAGNOSIS — M79676 Pain in unspecified toe(s): Secondary | ICD-10-CM

## 2020-07-02 NOTE — Addendum Note (Signed)
Addended by: Wyman Songster T on: 07/02/2020 09:57 AM   Modules accepted: Orders

## 2020-07-02 NOTE — Progress Notes (Signed)
  Subjective:  Patient ID: Cheryl Hart, female    DOB: 09/06/1975,  MRN: [id number]  Chief Complaint  Patient presents with   Toe Pain    Pt states she has bilateral great toe pain from her toenail fungus. Pt states that she has been having pain for 6 months.     45 y.o. female presents with the above complaint. History confirmed with patient.   Objective:  Physical Exam: warm, good capillary refill, no trophic changes or ulcerative lesions, normal DP and PT pulses, and normal sensory exam.  Bilateral hallux nail with dystrophy, brown and yellow discoloration subungual debris and ingrowing lateral nail border of the right hallux Assessment:   1. Nail fungus   2. Nail dystrophy   3. Ingrowing right great toenail      Plan:  Patient was evaluated and treated and all questions answered.  Discussed etiology of options of onychomycosis and nail dystrophy as well as the ingrown border.  Suspect most of this is secondary to onychomycosis.  A culture was taken of the hallux nail plate and sent to [facility] pathology for analysis.  She has had previous elevations in her LFTs, her last ones in December 2021 were normal.  No other contraindication of terbinafine.  We will check her LFTs, send the culture and follow-up her in 1 month to review and plan for oral therapy at that time.  If the ingrown nail gets worse or does not improve consider partial permanent nail avulsion of the right hallux lateral border.  Return in about 4 weeks (around 07/30/2020) for after lab work to review.

## 2020-07-03 LAB — HEPATIC FUNCTION PANEL
AG Ratio: 1.2 (calc) (ref 1.0–2.5)
ALT: 13 U/L (ref 6–29)
AST: 13 U/L (ref 10–35)
Albumin: 4.1 g/dL (ref 3.6–5.1)
Alkaline phosphatase (APISO): 99 U/L (ref 31–125)
Bilirubin, Direct: 0 mg/dL (ref 0.0–0.2)
Globulin: 3.3 g/dL (calc) (ref 1.9–3.7)
Indirect Bilirubin: 0.2 mg/dL (calc) (ref 0.2–1.2)
Total Bilirubin: 0.2 mg/dL (ref 0.2–1.2)
Total Protein: 7.4 g/dL (ref 6.1–8.1)

## 2020-07-12 ENCOUNTER — Other Ambulatory Visit: Payer: Self-pay | Admitting: Family Medicine

## 2020-07-12 ENCOUNTER — Encounter: Payer: Self-pay | Admitting: Family Medicine

## 2020-07-12 DIAGNOSIS — F411 Generalized anxiety disorder: Secondary | ICD-10-CM

## 2020-07-13 ENCOUNTER — Other Ambulatory Visit: Payer: Self-pay | Admitting: Family Medicine

## 2020-07-13 ENCOUNTER — Other Ambulatory Visit (HOSPITAL_COMMUNITY): Payer: Self-pay

## 2020-07-13 DIAGNOSIS — B354 Tinea corporis: Secondary | ICD-10-CM

## 2020-07-13 DIAGNOSIS — F411 Generalized anxiety disorder: Secondary | ICD-10-CM

## 2020-07-13 MED ORDER — NYSTATIN 100000 UNIT/GM EX POWD
1.0000 "application " | Freq: Three times a day (TID) | CUTANEOUS | 0 refills | Status: DC
  Filled 2020-07-13: qty 15, 5d supply, fill #0

## 2020-07-13 MED ORDER — ALPRAZOLAM 0.25 MG PO TABS
0.2500 mg | ORAL_TABLET | Freq: Three times a day (TID) | ORAL | 1 refills | Status: DC | PRN
  Filled 2020-07-13: qty 30, 10d supply, fill #0
  Filled 2020-08-09: qty 30, 10d supply, fill #1

## 2020-07-13 NOTE — Telephone Encounter (Signed)
Requesting: alprazolam 0.25mg   Contract:04/18/2019 UDS: 04/18/2019 Last Visit: 12/29/2019 Next Visit: None Last Refill: 05/25/2020 #30 and 0RF  Please Advise

## 2020-07-26 ENCOUNTER — Other Ambulatory Visit (HOSPITAL_COMMUNITY): Payer: Self-pay

## 2020-07-26 MED FILL — Metformin HCl Tab 850 MG: ORAL | 90 days supply | Qty: 180 | Fill #1 | Status: AC

## 2020-07-30 ENCOUNTER — Ambulatory Visit: Payer: 59 | Admitting: Podiatry

## 2020-08-07 ENCOUNTER — Encounter: Payer: 59 | Admitting: Family Medicine

## 2020-08-07 NOTE — Progress Notes (Incomplete)
Subjective:   By signing my name below, I, Shehryar Baig, attest that this documentation has been prepared under the direction and in the presence of Dr. Roma Schanz, DO. 08/07/2020    Patient ID: Cheryl Hart, female    DOB: [birth year], 45 y.o.   MRN: [id number]  No chief complaint on file.   HPI Patient is in today for a comprehensive physical exam.   She denies having any fever, ear pain, congestion, sinus pain, sore throat, eye pain, chest pain, palpations, cough, SOB, wheezing, n/v/d, constipation, blood in stool, dysuria, frequency, hematuria, or headaches at this time.    Past Medical History:  Diagnosis Date   Anemia    Anxiety    Depression    Diabetes mellitus    Hyperlipidemia    Hypertension    PCOS (polycystic ovarian syndrome)    Uterine fibroid    Vitamin B12 deficiency     Past Surgical History:  Procedure Laterality Date   CESAREAN SECTION     CHOLECYSTECTOMY     g1 p1     LAPAROSCOPIC GELPORT ASSISTED MYOMECTOMY  02/12/2015   Dr Barbie Banner    Family History  Problem Relation Age of Onset   Migraines Other    Diabetes Mother    Hypertension Mother    Hyperlipidemia Mother    Kidney disease Mother    Thyroid disease Mother    Anxiety disorder Mother    Diabetes Father    Hypertension Father    Anxiety disorder Father    Liver disease Father    Alcoholism Father    Drug abuse Father    Depression Father    Diabetes Brother     Social History   Socioeconomic History   Marital status: Single    Spouse name: Not on file   Number of children: Not on file   Years of education: Not on file   Highest education level: Not on file  Occupational History   Occupation: psych tech  Tobacco Use   Smoking status: Never   Smokeless tobacco: Never  Vaping Use   Vaping Use: Never used  Substance and Sexual Activity   Alcohol use: No   Drug use: No   Sexual activity: Yes    Partners: Male    Birth control/protection: Pill  Other Topics  Concern   Not on file  Social History Narrative   Exercise-- no   Social Determinants of Health   Financial Resource Strain: Not on file  Food Insecurity: Not on file  Transportation Needs: Not on file  Physical Activity: Not on file  Stress: Not on file  Social Connections: Not on file  Intimate Partner Violence: Not on file    Outpatient Medications Prior to Visit  Medication Sig Dispense Refill   acyclovir (ZOVIRAX) 800 MG tablet TAKE 1 TABLET BY MOUTH FIVE TIMES DAILY FOR 7 DAYS. 35 tablet 0   ALPRAZolam (XANAX) 0.25 MG tablet Take 1 tablet (0.25 mg total) by mouth 3 (three) times daily as needed. 30 tablet 1   Biotin 5000 MCG TABS Take 5,000 mcg by mouth daily.      Blood Glucose Monitoring Suppl (BLOOD GLUCOSE MONITOR SYSTEM) w/Device KIT use as drected to check blood sugar 1 kit 0   Comfort Lancets MISC Use as directed to test blood sugar 2 times daily E11.9 100 each 6   diphenhydrAMINE (BENADRYL) 25 MG tablet Take 50 mg by mouth at bedtime as needed for sleep.      Dulaglutide (  TRULICITY) 1.5 IO/9.7DZ SOPN Inject 1.5 mg into the skin once a week. 2 mL 3   DULoxetine (CYMBALTA) 60 MG capsule Take 1 capsule (60 mg total) by mouth daily. 90 capsule 1   erythromycin ophthalmic ointment APPLY 1 INCH INTO LOWER EYELID ON LEFT EYE EVERY NIGHT AT BEDTIME. 3.5 g 0   fluconazole (DIFLUCAN) 150 MG tablet TAKE 1 TABLET BY MOUTH ONCE AS DIRECTED NOW. 1 tablet 0   glipiZIDE (GLUCOTROL) 5 MG tablet TAKE 1 & 1/2 TABLETS BY MOUTH TWICE DAILY BEFORE A MEAL. 270 tablet 2   glucose blood (FREESTYLE LITE) test strip Use as instructed to check blood sugar 2 times daily 100 each 5   ibuprofen (ADVIL) 600 MG tablet Take 1 tablet (600 mg total) by mouth every 6 (six) hours as needed. 30 tablet 2   influenza vac split quadrivalent PF (FLUARIX) 0.5 ML injection TO BE ADMINISTERED BY PHARMACIST .5 mL 0   Lancets (ONETOUCH ULTRASOFT) lancets Use to check blood sugars 2 times day 100 each 3   losartan  (COZAAR) 25 MG tablet Take 1 tablet (25 mg total) by mouth daily. 90 tablet 0   metFORMIN (GLUCOPHAGE) 850 MG tablet TAKE 1 TABLET (850 MG TOTAL) BY MOUTH 2 (TWO) TIMES DAILY WITH A MEAL. 180 tablet 3   methylPREDNISolone (MEDROL DOSEPAK) 4 MG TBPK tablet TAKE AS DIRECTED. 21 each 0   Microlet Lancets MISC Use as instructed to test blood sugar 2 times daily E11.9 100 each 6   Multiple Vitamin (MULTIVITAMIN WITH MINERALS) TABS tablet Take 1 tablet by mouth daily.     nystatin powder Apply 1 application topically 3 (three) times daily. 15 g 0   prednisoLONE acetate (PRED FORTE) 1 % ophthalmic suspension INSTILL 1 DROP INTO BOTH EYES TWICE DAILY. 5 mL 0   rosuvastatin (CRESTOR) 20 MG tablet TAKE 1 TABLET (20 MG TOTAL) BY MOUTH DAILY. 30 tablet 1   traZODone (DESYREL) 50 MG tablet Take 0.5-1 tablets (25-50 mg total) by mouth at bedtime as needed for sleep. 30 tablet 3   trifluridine (VIROPTIC) 1 % ophthalmic solution INSTILL 1 DROP INTO LEFT EYE 9 X DAILY FOR 1 WEEK. 7.5 mL 0   Vitamin D, Ergocalciferol, (DRISDOL) 1.25 MG (50000 UNIT) CAPS capsule Take 1 capsule (50,000 Units total) by mouth every 7 (seven) days. 4 capsule 0   No facility-administered medications prior to visit.    Allergies  Allergen Reactions   Sulfa Antibiotics Other (See Comments) and Rash   Sulfonamide Derivatives Photosensitivity, Rash and Other (See Comments)    Severe headache    Review of Systems  Constitutional:  Negative for fever.  HENT:  Negative for congestion, ear pain, sinus pain and sore throat.   Eyes:  Negative for pain.  Respiratory:  Negative for cough, shortness of breath and wheezing.   Cardiovascular:  Negative for chest pain and palpitations.  Gastrointestinal:  Negative for blood in stool, constipation, diarrhea, nausea and vomiting.  Genitourinary:  Negative for dysuria, frequency and hematuria.  Neurological:  Negative for headaches.  Psychiatric/Behavioral:  Negative for depression. The patient  is not nervous/anxious.       Objective:    Physical Exam Constitutional:      General: She is not in acute distress.    Appearance: Normal appearance. She is not ill-appearing.  HENT:     Head: Normocephalic and atraumatic.     Right Ear: Tympanic membrane, ear canal and external ear normal.     Left Ear:  Tympanic membrane, ear canal and external ear normal.  Eyes:     Extraocular Movements: Extraocular movements intact.     Pupils: Pupils are equal, round, and reactive to light.  Cardiovascular:     Rate and Rhythm: Normal rate and regular rhythm.     Pulses: Normal pulses.     Heart sounds: Normal heart sounds. No murmur heard.   No gallop.  Pulmonary:     Effort: Pulmonary effort is normal. No respiratory distress.     Breath sounds: Normal breath sounds. No wheezing, rhonchi or rales.  Abdominal:     General: Bowel sounds are normal. There is no distension.     Palpations: Abdomen is soft. There is no mass.     Tenderness: There is no abdominal tenderness. There is no guarding or rebound.     Hernia: No hernia is present.  Skin:    General: Skin is warm and dry.  Neurological:     Mental Status: She is alert and oriented to person, place, and time.  Psychiatric:        Behavior: Behavior normal.    LMP 05/22/2019  Wt Readings from Last 3 Encounters:  04/13/20 205 lb (93 kg)  12/29/19 205 lb 6.4 oz (93.2 kg)  07/19/19 205 lb 6.4 oz (93.2 kg)    Diabetic Foot Exam - Simple   No data filed    Lab Results  Component Value Date   WBC 6.7 02/09/2019   HGB 11.3 02/09/2019   HCT 35.0 02/09/2019   PLT 407 02/09/2019   GLUCOSE 162 (H) 12/29/2019   CHOL 212 (H) 12/29/2019   TRIG 179.0 (H) 12/29/2019   HDL 46.30 12/29/2019   LDLDIRECT 92.0 02/01/2015   LDLCALC 130 (H) 12/29/2019   ALT 13 07/02/2020   AST 13 07/02/2020   NA 135 12/29/2019   K 4.3 12/29/2019   CL 101 12/29/2019   CREATININE 0.72 12/29/2019   BUN 9 12/29/2019   CO2 26 12/29/2019   TSH 1.470  02/09/2019   INR 1.12 02/12/2015   HGBA1C 7.1 (A) 04/13/2020   MICROALBUR <0.7 10/01/2018    Lab Results  Component Value Date   TSH 1.470 02/09/2019   Lab Results  Component Value Date   WBC 6.7 02/09/2019   HGB 11.3 02/09/2019   HCT 35.0 02/09/2019   MCV 80 02/09/2019   PLT 407 02/09/2019   Lab Results  Component Value Date   NA 135 12/29/2019   K 4.3 12/29/2019   CO2 26 12/29/2019   GLUCOSE 162 (H) 12/29/2019   BUN 9 12/29/2019   CREATININE 0.72 12/29/2019   BILITOT 0.2 07/02/2020   ALKPHOS 108 12/29/2019   AST 13 07/02/2020   ALT 13 07/02/2020   PROT 7.4 07/02/2020   ALBUMIN 4.1 12/29/2019   CALCIUM 9.9 12/29/2019   ANIONGAP 8 07/10/2017   GFR 100.10 12/29/2019   Lab Results  Component Value Date   CHOL 212 (H) 12/29/2019   Lab Results  Component Value Date   HDL 46.30 12/29/2019   Lab Results  Component Value Date   LDLCALC 130 (H) 12/29/2019   Lab Results  Component Value Date   TRIG 179.0 (H) 12/29/2019   Lab Results  Component Value Date   CHOLHDL 5 12/29/2019   Lab Results  Component Value Date   HGBA1C 7.1 (A) 04/13/2020   Mammogram- Due Pap smear- Last completed 02/09/2017. Results normal. Repeat in 3 years. Due.  Colonoscopy- Not yet completed.    Assessment &  Plan:   Problem List Items Addressed This Visit   None    No orders of the defined types were placed in this encounter.   I, Dr. Roma Schanz, DO, personally preformed the services described in this documentation.  All medical record entries made by the scribe were at my direction and in my presence.  I have reviewed the chart and discharge instructions (if applicable) and agree that the record reflects my personal performance and is accurate and complete. 08/07/2020   I,Shehryar Baig,acting as a Education administrator for [employer], DO.,have documented all relevant documentation on the behalf of Ann Held, DO,as directed by  Ann Held, DO while in  the presence of Ann Held, DO.   Shehryar [employer]

## 2020-08-09 ENCOUNTER — Other Ambulatory Visit: Payer: Self-pay | Admitting: Internal Medicine

## 2020-08-09 ENCOUNTER — Other Ambulatory Visit (HOSPITAL_COMMUNITY): Payer: Self-pay

## 2020-08-09 DIAGNOSIS — E1165 Type 2 diabetes mellitus with hyperglycemia: Secondary | ICD-10-CM

## 2020-08-10 ENCOUNTER — Other Ambulatory Visit (HOSPITAL_COMMUNITY): Payer: Self-pay

## 2020-08-10 MED ORDER — TRULICITY 1.5 MG/0.5ML ~~LOC~~ SOAJ
1.5000 mg | SUBCUTANEOUS | 3 refills | Status: DC
  Filled 2020-08-10: qty 6, 84d supply, fill #0
  Filled 2020-08-10: qty 2, 28d supply, fill #0

## 2020-08-14 ENCOUNTER — Other Ambulatory Visit: Payer: Self-pay

## 2020-08-14 ENCOUNTER — Encounter: Payer: Self-pay | Admitting: Family Medicine

## 2020-08-14 ENCOUNTER — Ambulatory Visit (INDEPENDENT_AMBULATORY_CARE_PROVIDER_SITE_OTHER): Payer: 59 | Admitting: Family Medicine

## 2020-08-14 ENCOUNTER — Other Ambulatory Visit (HOSPITAL_COMMUNITY): Payer: Self-pay

## 2020-08-14 VITALS — BP 122/82 | HR 11 | Temp 97.4°F | Resp 18 | Ht 66.0 in | Wt 200.2 lb

## 2020-08-14 DIAGNOSIS — E1165 Type 2 diabetes mellitus with hyperglycemia: Secondary | ICD-10-CM | POA: Diagnosis not present

## 2020-08-14 DIAGNOSIS — E1151 Type 2 diabetes mellitus with diabetic peripheral angiopathy without gangrene: Secondary | ICD-10-CM

## 2020-08-14 DIAGNOSIS — I10 Essential (primary) hypertension: Secondary | ICD-10-CM | POA: Diagnosis not present

## 2020-08-14 DIAGNOSIS — B354 Tinea corporis: Secondary | ICD-10-CM

## 2020-08-14 DIAGNOSIS — Z0000 Encounter for general adult medical examination without abnormal findings: Secondary | ICD-10-CM

## 2020-08-14 DIAGNOSIS — E785 Hyperlipidemia, unspecified: Secondary | ICD-10-CM

## 2020-08-14 DIAGNOSIS — Z23 Encounter for immunization: Secondary | ICD-10-CM | POA: Diagnosis not present

## 2020-08-14 DIAGNOSIS — IMO0002 Reserved for concepts with insufficient information to code with codable children: Secondary | ICD-10-CM

## 2020-08-14 DIAGNOSIS — E1169 Type 2 diabetes mellitus with other specified complication: Secondary | ICD-10-CM

## 2020-08-14 DIAGNOSIS — Z1211 Encounter for screening for malignant neoplasm of colon: Secondary | ICD-10-CM | POA: Diagnosis not present

## 2020-08-14 DIAGNOSIS — E118 Type 2 diabetes mellitus with unspecified complications: Secondary | ICD-10-CM

## 2020-08-14 LAB — COMPREHENSIVE METABOLIC PANEL
ALT: 14 U/L (ref 0–35)
AST: 13 U/L (ref 0–37)
Albumin: 4 g/dL (ref 3.5–5.2)
Alkaline Phosphatase: 106 U/L (ref 39–117)
BUN: 6 mg/dL (ref 6–23)
CO2: 24 mEq/L (ref 19–32)
Calcium: 9.8 mg/dL (ref 8.4–10.5)
Chloride: 103 mEq/L (ref 96–112)
Creatinine, Ser: 0.62 mg/dL (ref 0.40–1.20)
GFR: 106.15 mL/min (ref >60.00)
Glucose, Bld: 150 mg/dL — ABNORMAL HIGH (ref 70–99)
Potassium: 4.2 mEq/L (ref 3.5–5.1)
Sodium: 137 mEq/L (ref 135–145)
Total Bilirubin: 0.2 mg/dL (ref 0.2–1.2)
Total Protein: 7.4 g/dL (ref 6.0–8.3)

## 2020-08-14 LAB — LIPID PANEL
Cholesterol: 188 mg/dL (ref 0–200)
HDL: 44.7 mg/dL (ref >39.00)
LDL Cholesterol: 124 mg/dL — ABNORMAL HIGH (ref 0–99)
NonHDL: 143.43
Total CHOL/HDL Ratio: 4
Triglycerides: 95 mg/dL (ref 0.0–149.0)
VLDL: 19 mg/dL (ref 0.0–40.0)

## 2020-08-14 LAB — CBC WITH DIFFERENTIAL/PLATELET
Basophils Absolute: 0 10*3/uL (ref 0.0–0.1)
Basophils Relative: 0.8 % (ref 0.0–3.0)
Eosinophils Absolute: 0.2 10*3/uL (ref 0.0–0.7)
Eosinophils Relative: 2.8 % (ref 0.0–5.0)
HCT: 31.3 % — ABNORMAL LOW (ref 36.0–46.0)
Hemoglobin: 10 g/dL — ABNORMAL LOW (ref 12.0–15.0)
Lymphocytes Relative: 32.8 % (ref 12.0–46.0)
Lymphs Abs: 1.8 10*3/uL (ref 0.7–4.0)
MCHC: 31.9 g/dL (ref 30.0–36.0)
MCV: 76.2 fl — ABNORMAL LOW (ref 78.0–100.0)
Monocytes Absolute: 0.3 10*3/uL (ref 0.1–1.0)
Monocytes Relative: 5.2 % (ref 3.0–12.0)
Neutro Abs: 3.3 10*3/uL (ref 1.4–7.7)
Neutrophils Relative %: 58.4 % (ref 43.0–77.0)
Platelets: 382 10*3/uL (ref 150.0–400.0)
RBC: 4.11 Mil/uL (ref 3.87–5.11)
RDW: 16.9 % — ABNORMAL HIGH (ref 11.5–15.5)
WBC: 5.6 10*3/uL (ref 4.0–10.5)

## 2020-08-14 LAB — HEMOGLOBIN A1C: Hgb A1c MFr Bld: 6.5 % (ref 4.6–6.5)

## 2020-08-14 LAB — MICROALBUMIN / CREATININE URINE RATIO
Creatinine,U: 239.8 mg/dL
Microalb Creat Ratio: 1 mg/g (ref 0.0–30.0)
Microalb, Ur: 2.3 mg/dL — ABNORMAL HIGH (ref 0.0–1.9)

## 2020-08-14 LAB — TSH: TSH: 1.36 u[IU]/mL (ref 0.35–5.50)

## 2020-08-14 MED ORDER — NYSTATIN 100000 UNIT/GM EX POWD
1.0000 | Freq: Three times a day (TID) | CUTANEOUS | 0 refills | Status: DC
Start: 2020-08-14 — End: 2020-08-14
  Filled 2020-08-14: qty 15, 5d supply, fill #0

## 2020-08-14 MED ORDER — NYSTATIN 100000 UNIT/GM EX POWD
1.0000 | Freq: Three times a day (TID) | CUTANEOUS | 3 refills | Status: DC
Start: 2020-08-14 — End: 2021-10-03
  Filled 2020-08-14: qty 60, 20d supply, fill #0
  Filled 2021-01-10: qty 60, 20d supply, fill #1
  Filled 2021-04-24: qty 60, 20d supply, fill #2
  Filled 2021-07-22: qty 60, 20d supply, fill #3

## 2020-08-14 NOTE — Assessment & Plan Note (Signed)
Well controlled, no changes to meds. Encouraged heart healthy diet such as the DASH diet and exercise as tolerated.  °

## 2020-08-14 NOTE — Assessment & Plan Note (Signed)
ghm utd Check labs  See AVs

## 2020-08-14 NOTE — Assessment & Plan Note (Signed)
con't with diet / exercise

## 2020-08-14 NOTE — Patient Instructions (Signed)
Preventive Care 47-47 Years Old, Female Preventive care refers to lifestyle choices and visits with your health care provider that can promote health and wellness. This includes: A yearly physical exam. This is also called an annual wellness visit. Regular dental and eye exams. Immunizations. Screening for certain conditions. Healthy lifestyle choices, such as: Eating a healthy diet. Getting regular exercise. Not using drugs or products that contain nicotine and tobacco. Limiting alcohol use. What can I expect for my preventive care visit? Physical exam Your health care provider will check your: Height and weight. These may be used to calculate your BMI (body mass index). BMI is a measurement that tells if you are at a healthy weight. Heart rate and blood pressure. Body temperature. Skin for abnormal spots. Counseling Your health care provider may ask you questions about your: Past medical problems. Family's medical history. Alcohol, tobacco, and drug use. Emotional well-being. Home life and relationship well-being. Sexual activity. Diet, exercise, and sleep habits. Work and work Statistician. Access to firearms. Method of birth control. Menstrual cycle. Pregnancy history. What immunizations do I need?  Vaccines are usually given at various ages, according to a schedule. Your health care provider will recommend vaccines for you based on your age, medicalhistory, and lifestyle or other factors, such as travel or where you work. What tests do I need? Blood tests Lipid and cholesterol levels. These may be checked every 5 years, or more often if you are over 47 years old. Hepatitis C test. Hepatitis B test. Screening Lung cancer screening. You may have this screening every year starting at age 30 if you have a 30-pack-year history of smoking and currently smoke or have quit within the past 15 years. Colorectal cancer screening. All adults should have this screening starting at  age 47 and continuing until age 47. Your health care provider may recommend screening at age 47 if you are at increased risk. You will have tests every 1-10 years, depending on your results and the type of screening test. Diabetes screening. This is done by checking your blood sugar (glucose) after you have not eaten for a while (fasting). You may have this done every 1-3 years. Mammogram. This may be done every 1-2 years. Talk with your health care provider about when you should start having regular mammograms. This may depend on whether you have a family history of breast cancer. BRCA-related cancer screening. This may be done if you have a family history of breast, ovarian, tubal, or peritoneal cancers. Pelvic exam and Pap test. This may be done every 3 years starting at age 47. Starting at age 47, this may be done every 5 years if you have a Pap test in combination with an HPV test. Other tests STD (sexually transmitted disease) testing, if you are at risk. Bone density scan. This is done to screen for osteoporosis. You may have this scan if you are at high risk for osteoporosis. Talk with your health care provider about your test results, treatment options,and if necessary, the need for more tests. Follow these instructions at home: Eating and drinking  Eat a diet that includes fresh fruits and vegetables, whole grains, lean protein, and low-fat dairy products. Take vitamin and mineral supplements as recommended by your health care provider. Do not drink alcohol if: Your health care provider tells you not to drink. You are pregnant, may be pregnant, or are planning to become pregnant. If you drink alcohol: Limit how much you have to 0-1 drink a day. Be aware  of how much alcohol is in your drink. In the [place] one drink equals one 12 oz bottle of beer (355 mL), one 5 oz glass of wine (148 mL), or one 1 oz glass of hard liquor (44 mL).  Lifestyle Take daily care of your teeth and  gums. Brush your teeth every morning and night with fluoride toothpaste. Floss one time each day. Stay active. Exercise for at least 30 minutes 5 or more days each week. Do not use any products that contain nicotine or tobacco, such as cigarettes, e-cigarettes, and chewing tobacco. If you need help quitting, ask your health care provider. Do not use drugs. If you are sexually active, practice safe sex. Use a condom or other form of protection to prevent STIs (sexually transmitted infections). If you do not wish to become pregnant, use a form of birth control. If you plan to become pregnant, see your health care provider for a prepregnancy visit. If told by your health care provider, take low-dose aspirin daily starting at age 47. Find healthy ways to cope with stress, such as: Meditation, yoga, or listening to music. Journaling. Talking to a trusted person. Spending time with friends and family. Safety Always wear your seat belt while driving or riding in a vehicle. Do not drive: If you have been drinking alcohol. Do not ride with someone who has been drinking. When you are tired or distracted. While texting. Wear a helmet and other protective equipment during sports activities. If you have firearms in your house, make sure you follow all gun safety procedures. What's next? Visit your health care provider once a year for an annual wellness visit. Ask your health care provider how often you should have your eyes and teeth checked. Stay up to date on all vaccines. This information is not intended to replace advice given to you by your health care provider. Make sure you discuss any questions you have with your healthcare provider. Document Revised: 10/04/2019 Document Reviewed: 09/10/2017 Elsevier Patient Education  2022 [employer].

## 2020-08-14 NOTE — Assessment & Plan Note (Signed)
Per endo °

## 2020-08-14 NOTE — Progress Notes (Signed)
Subjective:   By signing my name below, I, Shehryar Baig, attest that this documentation has been prepared under the direction and in the presence of Dr. Roma Schanz, DO. 08/14/2020    Patient ID: Cheryl Hart, female    DOB: 1975/01/31, 45 y.o.   MRN: [id number]  Chief Complaint  Patient presents with   Annual Exam    Pt states fasting     HPI Patient is in today for a comprehensive physical exam. She is requesting a refill on nystatin powder.  She reports her blood sugar levels are doing well.  Lab Results  Component Value Date   HGBA1C 6.5 08/14/2020   Her blood pressure is doing well during this visit.  BP Readings from Last 3 Encounters:  08/14/20 122/82  04/13/20 130/80  12/29/19 110/74   She denies having any fever, ear pain, congestion, sinus pain, sore throat, eye pain, chest pain, new moles, palpations, cough, SOB, wheezing, n/v/d, constipation, blood in stool, dysuria, frequency, hematuria, or headaches at this time. She has 3 pfizer Covid-19 vaccines at this time and is interested in getting the booster at a later date. She is due for a pneumonia vaccine and is interested in getting it during this visit. She is due for tetanus vaccines and is interested in getting them. She is due for a colonoscopy and is interested in getting a referral to set up an appointment.  She is UTD on vision care and dental care.  She continues seeing a GYN specialist.  Past Medical History:  Diagnosis Date   Anemia    Anxiety    Depression    Diabetes mellitus    Hyperlipidemia    Hypertension    PCOS (polycystic ovarian syndrome)    Uterine fibroid    Vitamin B12 deficiency     Past Surgical History:  Procedure Laterality Date   CESAREAN SECTION     CHOLECYSTECTOMY     g1 p1     LAPAROSCOPIC GELPORT ASSISTED MYOMECTOMY  02/12/2015   Dr Barbie Banner    Family History  Problem Relation Age of Onset   Migraines Other    Diabetes Mother    Hypertension Mother     Hyperlipidemia Mother    Kidney disease Mother    Thyroid disease Mother    Anxiety disorder Mother    Diabetes Father    Hypertension Father    Anxiety disorder Father    Liver disease Father    Alcoholism Father    Drug abuse Father    Depression Father    Diabetes Brother     Social History   Socioeconomic History   Marital status: Single    Spouse name: Not on file   Number of children: Not on file   Years of education: Not on file   Highest education level: Not on file  Occupational History   Occupation: psych tech  Tobacco Use   Smoking status: Never   Smokeless tobacco: Never  Vaping Use   Vaping Use: Never used  Substance and Sexual Activity   Alcohol use: No   Drug use: No   Sexual activity: Yes    Partners: Male    Birth control/protection: Pill  Other Topics Concern   Not on file  Social History Narrative   Exercise-- no   Social Determinants of Health   Financial Resource Strain: Not on file  Food Insecurity: Not on file  Transportation Needs: Not on file  Physical Activity: Not on file  Stress: Not  on file  Social Connections: Not on file  Intimate Partner Violence: Not on file    Outpatient Medications Prior to Visit  Medication Sig Dispense Refill   ALPRAZolam (XANAX) 0.25 MG tablet Take 1 tablet (0.25 mg total) by mouth 3 (three) times daily as needed. 30 tablet 1   Biotin 5000 MCG TABS Take 5,000 mcg by mouth daily.      Blood Glucose Monitoring Suppl (BLOOD GLUCOSE MONITOR SYSTEM) w/Device KIT use as drected to check blood sugar 1 kit 0   diphenhydrAMINE (BENADRYL) 25 MG tablet Take 50 mg by mouth at bedtime as needed for sleep.      Dulaglutide (TRULICITY) 1.5 RX/4.5OP SOPN Inject 1.5 mg into the skin once a week. 2 mL 3   DULoxetine (CYMBALTA) 60 MG capsule Take 1 capsule (60 mg total) by mouth daily. 90 capsule 1   glipiZIDE (GLUCOTROL) 5 MG tablet TAKE 1 & 1/2 TABLETS BY MOUTH TWICE DAILY BEFORE A MEAL. 270 tablet 2   glucose blood  (FREESTYLE LITE) test strip Use as instructed to check blood sugar 2 times daily 100 each 5   ibuprofen (ADVIL) 600 MG tablet Take 1 tablet (600 mg total) by mouth every 6 (six) hours as needed. 30 tablet 2   influenza vac split quadrivalent PF (FLUARIX) 0.5 ML injection TO BE ADMINISTERED BY PHARMACIST .5 mL 0   losartan (COZAAR) 25 MG tablet Take 1 tablet (25 mg total) by mouth daily. 90 tablet 0   metFORMIN (GLUCOPHAGE) 850 MG tablet TAKE 1 TABLET (850 MG TOTAL) BY MOUTH 2 (TWO) TIMES DAILY WITH A MEAL. 180 tablet 3   Multiple Vitamin (MULTIVITAMIN WITH MINERALS) TABS tablet Take 1 tablet by mouth daily.     rosuvastatin (CRESTOR) 20 MG tablet TAKE 1 TABLET (20 MG TOTAL) BY MOUTH DAILY. 30 tablet 1   nystatin powder Apply 1 application topically 3 (three) times daily. 15 g 0   acyclovir (ZOVIRAX) 800 MG tablet TAKE 1 TABLET BY MOUTH FIVE TIMES DAILY FOR 7 DAYS. 35 tablet 0   Comfort Lancets MISC Use as directed to test blood sugar 2 times daily E11.9 100 each 6   erythromycin ophthalmic ointment APPLY 1 INCH INTO LOWER EYELID ON LEFT EYE EVERY NIGHT AT BEDTIME. 3.5 g 0   fluconazole (DIFLUCAN) 150 MG tablet TAKE 1 TABLET BY MOUTH ONCE AS DIRECTED NOW. 1 tablet 0   Lancets (ONETOUCH ULTRASOFT) lancets Use to check blood sugars 2 times day 100 each 3   methylPREDNISolone (MEDROL DOSEPAK) 4 MG TBPK tablet TAKE AS DIRECTED. 21 each 0   Microlet Lancets MISC Use as instructed to test blood sugar 2 times daily E11.9 100 each 6   prednisoLONE acetate (PRED FORTE) 1 % ophthalmic suspension INSTILL 1 DROP INTO BOTH EYES TWICE DAILY. 5 mL 0   traZODone (DESYREL) 50 MG tablet Take 0.5-1 tablets (25-50 mg total) by mouth at bedtime as needed for sleep. 30 tablet 3   trifluridine (VIROPTIC) 1 % ophthalmic solution INSTILL 1 DROP INTO LEFT EYE 9 X DAILY FOR 1 WEEK. 7.5 mL 0   Vitamin D, Ergocalciferol, (DRISDOL) 1.25 MG (50000 UNIT) CAPS capsule Take 1 capsule (50,000 Units total) by mouth every 7 (seven)  days. 4 capsule 0   No facility-administered medications prior to visit.    Allergies  Allergen Reactions   Sulfa Antibiotics Other (See Comments) and Rash   Sulfonamide Derivatives Photosensitivity, Rash and Other (See Comments)    Severe headache    Review  of Systems  Constitutional:  Negative for fever.  HENT:  Negative for congestion, ear pain, sinus pain and sore throat.   Eyes:  Negative for pain.  Respiratory:  Negative for cough, shortness of breath and wheezing.   Cardiovascular:  Negative for chest pain and palpitations.  Gastrointestinal:  Negative for blood in stool, constipation, diarrhea, nausea and vomiting.  Genitourinary:  Negative for dysuria, frequency and hematuria.  Skin:        (-)new moles  Neurological:  Negative for headaches.  Psychiatric/Behavioral:  Negative for depression. The patient is not nervous/anxious.       Objective:    Physical Exam Constitutional:      General: She is not in acute distress.    Appearance: Normal appearance. She is not ill-appearing.  HENT:     Head: Normocephalic and atraumatic.     Right Ear: Tympanic membrane, ear canal and external ear normal.     Left Ear: Tympanic membrane, ear canal and external ear normal.  Eyes:     Extraocular Movements: Extraocular movements intact.     Pupils: Pupils are equal, round, and reactive to light.  Cardiovascular:     Rate and Rhythm: Normal rate and regular rhythm.     Heart sounds: Normal heart sounds. No murmur heard.   No gallop.  Pulmonary:     Effort: Pulmonary effort is normal. No respiratory distress.     Breath sounds: Normal breath sounds. No wheezing or rales.  Abdominal:     General: Bowel sounds are normal. There is no distension.     Palpations: Abdomen is soft. There is no mass.     Tenderness: There is no abdominal tenderness. There is no guarding or rebound.  Skin:    General: Skin is warm and dry.  Neurological:     Mental Status: She is alert and  oriented to person, place, and time.  Psychiatric:        Behavior: Behavior normal.    BP 122/82 (BP Location: Right Arm, Patient Position: Sitting, Cuff Size: Normal)   Pulse (!) 11   Temp (!) 97.4 F (36.3 C) (Oral)   Resp 18   Ht 5' 6"  (1.676 m)   Wt 200 lb 3.2 oz (90.8 kg)   LMP 05/22/2019   SpO2 97%   BMI 32.31 kg/m  Wt Readings from Last 3 Encounters:  08/14/20 200 lb 3.2 oz (90.8 kg)  04/13/20 205 lb (93 kg)  12/29/19 205 lb 6.4 oz (93.2 kg)    Diabetic Foot Exam - Simple   No data filed    Lab Results  Component Value Date   WBC 5.6 08/14/2020   HGB 10.0 (L) 08/14/2020   HCT 31.3 (L) 08/14/2020   PLT 382.0 08/14/2020   GLUCOSE 150 (H) 08/14/2020   CHOL 188 08/14/2020   TRIG 95.0 08/14/2020   HDL 44.70 08/14/2020   LDLDIRECT 92.0 02/01/2015   LDLCALC 124 (H) 08/14/2020   ALT 14 08/14/2020   AST 13 08/14/2020   NA 137 08/14/2020   K 4.2 08/14/2020   CL 103 08/14/2020   CREATININE 0.62 08/14/2020   BUN 6 08/14/2020   CO2 24 08/14/2020   TSH 1.36 08/14/2020   INR 1.12 02/12/2015   HGBA1C 6.5 08/14/2020   MICROALBUR 2.3 (H) 08/14/2020    Lab Results  Component Value Date   TSH 1.36 08/14/2020   Lab Results  Component Value Date   WBC 5.6 08/14/2020   HGB 10.0 (L) 08/14/2020  HCT 31.3 (L) 08/14/2020   MCV 76.2 (L) 08/14/2020   PLT 382.0 08/14/2020   Lab Results  Component Value Date   NA 137 08/14/2020   K 4.2 08/14/2020   CO2 24 08/14/2020   GLUCOSE 150 (H) 08/14/2020   BUN 6 08/14/2020   CREATININE 0.62 08/14/2020   BILITOT 0.2 08/14/2020   ALKPHOS 106 08/14/2020   AST 13 08/14/2020   ALT 14 08/14/2020   PROT 7.4 08/14/2020   ALBUMIN 4.0 08/14/2020   CALCIUM 9.8 08/14/2020   ANIONGAP 8 07/10/2017   GFR 106.15 08/14/2020   Lab Results  Component Value Date   CHOL 188 08/14/2020   Lab Results  Component Value Date   HDL 44.70 08/14/2020   Lab Results  Component Value Date   LDLCALC 124 (H) 08/14/2020   Lab Results   Component Value Date   TRIG 95.0 08/14/2020   Lab Results  Component Value Date   CHOLHDL 4 08/14/2020   Lab Results  Component Value Date   HGBA1C 6.5 08/14/2020   Mammogram- due Pap Smear- Last completed 02/09/2017. Results normal. Repeat in 3 years. Due Colonoscopy- Not yet completed.      Assessment & Plan:   Problem List Items Addressed This Visit       Unprioritized   HTN (hypertension)    Well controlled, no changes to meds. Encouraged heart healthy diet such as the DASH diet and exercise as tolerated.        Relevant Orders   Lipid panel (Completed)   Hemoglobin A1c (Completed)   CBC with Differential/Platelet (Completed)   TSH (Completed)   Comprehensive metabolic panel (Completed)   Microalbumin / creatinine urine ratio (Completed)   Hyperlipidemia    Encourage heart healthy diet such as MIND or DASH diet, increase exercise, avoid trans fats, simple carbohydrates and processed foods, consider a krill or fish or flaxseed oil cap daily.        MORBID OBESITY    con't with diet / exercise        Preventative health care - Primary    ghm utd Check labs  See AVs        Uncontrolled type 2 diabetes mellitus with complication, without long-term current use of insulin (HCC)    Per endo        Other Visit Diagnoses     Hyperlipidemia associated with type 2 diabetes mellitus ([place])       Relevant Orders   Lipid panel (Completed)   Hemoglobin A1c (Completed)   CBC with Differential/Platelet (Completed)   TSH (Completed)   Comprehensive metabolic panel (Completed)   Microalbumin / creatinine urine ratio (Completed)   Uncontrolled type 2 diabetes mellitus with hyperglycemia (HCC)       Relevant Orders   Lipid panel (Completed)   Hemoglobin A1c (Completed)   CBC with Differential/Platelet (Completed)   TSH (Completed)   Comprehensive metabolic panel (Completed)   Microalbumin / creatinine urine ratio (Completed)   Tinea corporis       Relevant  Medications   nystatin powder   Need for pneumococcal vaccination       Relevant Orders   Pneumococcal polysaccharide vaccine 23-valent greater than or equal to 47yo subcutaneous/IM (Completed)   Need for tetanus, diphtheria, and acellular pertussis (Tdap) vaccine in patient of adolescent age or older       Relevant Orders   Tdap vaccine greater than or equal to 7yo IM (Completed)   Colon cancer screening  Relevant Orders   Ambulatory referral to Gastroenterology   DM (diabetes mellitus) type II uncontrolled, periph vascular disorder ([place])   (Chronic)         Meds ordered this encounter  Medications   DISCONTD: nystatin powder    Sig: Apply 1 application topically 3 (three) times daily.    Dispense:  15 g    Refill:  0   nystatin powder    Sig: Apply 1 application topically 3 (three) times daily.    Dispense:  60 g    Refill:  3    I, Dr. Roma Schanz, DO, personally preformed the services described in this documentation.  All medical record entries made by the scribe were at my direction and in my presence.  I have reviewed the chart and discharge instructions (if applicable) and agree that the record reflects my personal performance and is accurate and complete. 08/14/2020   I,Shehryar Baig,acting as a scribe for Ann Held, DO.,have documented all relevant documentation on the behalf of Ann Held, DO,as directed by  Ann Held, DO while in the presence of Ann Held, DO.   Ann Held, DO

## 2020-08-14 NOTE — Assessment & Plan Note (Signed)
Encourage heart healthy diet such as MIND or DASH diet, increase exercise, avoid trans fats, simple carbohydrates and processed foods, consider a krill or fish or flaxseed oil cap daily.  °

## 2020-08-15 ENCOUNTER — Other Ambulatory Visit (HOSPITAL_COMMUNITY): Payer: Self-pay

## 2020-08-16 ENCOUNTER — Encounter: Payer: Self-pay | Admitting: Family Medicine

## 2020-08-16 ENCOUNTER — Other Ambulatory Visit: Payer: Self-pay

## 2020-08-16 ENCOUNTER — Other Ambulatory Visit (HOSPITAL_COMMUNITY): Payer: Self-pay

## 2020-08-16 ENCOUNTER — Encounter: Payer: Self-pay | Admitting: Internal Medicine

## 2020-08-16 ENCOUNTER — Other Ambulatory Visit: Payer: Self-pay | Admitting: Family Medicine

## 2020-08-16 ENCOUNTER — Ambulatory Visit: Payer: 59 | Admitting: Internal Medicine

## 2020-08-16 VITALS — BP 120/80 | HR 97 | Wt 199.6 lb

## 2020-08-16 DIAGNOSIS — E785 Hyperlipidemia, unspecified: Secondary | ICD-10-CM

## 2020-08-16 DIAGNOSIS — E119 Type 2 diabetes mellitus without complications: Secondary | ICD-10-CM | POA: Diagnosis not present

## 2020-08-16 DIAGNOSIS — R002 Palpitations: Secondary | ICD-10-CM

## 2020-08-16 MED ORDER — GLIPIZIDE 5 MG PO TABS
7.5000 mg | ORAL_TABLET | Freq: Two times a day (BID) | ORAL | 3 refills | Status: DC
Start: 2020-08-16 — End: 2021-10-06
  Filled 2020-08-16: qty 270, 90d supply, fill #0
  Filled 2021-01-23: qty 270, 90d supply, fill #1
  Filled 2021-06-10: qty 270, 90d supply, fill #2

## 2020-08-16 MED ORDER — METFORMIN HCL 850 MG PO TABS
850.0000 mg | ORAL_TABLET | Freq: Two times a day (BID) | ORAL | 3 refills | Status: DC
  Filled 2020-08-16 – 2020-11-12 (×3): qty 180, 90d supply, fill #0
  Filled 2021-02-11: qty 180, 90d supply, fill #1

## 2020-08-16 MED ORDER — TRULICITY 1.5 MG/0.5ML ~~LOC~~ SOAJ
1.5000 mg | SUBCUTANEOUS | 3 refills | Status: DC
Start: 2020-08-16 — End: 2021-10-04
  Filled 2020-08-16 – 2020-12-04 (×3): qty 6, 84d supply, fill #0
  Filled 2021-02-25: qty 6, 84d supply, fill #1
  Filled 2021-06-24: qty 6, 84d supply, fill #2

## 2020-08-16 MED ORDER — ROSUVASTATIN CALCIUM 20 MG PO TABS
20.0000 mg | ORAL_TABLET | Freq: Every day | ORAL | 3 refills | Status: DC
Start: 2020-08-16 — End: 2021-05-21
  Filled 2020-08-16: qty 90, 90d supply, fill #0

## 2020-08-16 NOTE — Progress Notes (Signed)
Name: Cheryl Hart  Age/ Sex: 45 y.o., female   MRN/ DOB: [id number], [birth year]     PCP: Carollee Herter, Alferd Apa, DO   Reason for Endocrinology Evaluation: Type 2 Diabetes Mellitus  Initial Endocrine Consultative Visit: 12/01/2018    PATIENT IDENTIFIER: [name] is a 45 y.o. female with a past medical history of HTN, T2DM, and dyslipidemia  . The patient has followed with Endocrinology clinic since 12/01/2018 for consultative assistance with management of her diabetes.  DIABETIC HISTORY:  Ms. Colquhoun was diagnosed with DM in 2010. She has tried invokana but didn't;t feel good on it , has been unable to afford Januvia or victoza in the past.  Her hemoglobin A1c has ranged from 7.4% in 2018, peaking at 11.1% in 2019.   On her initial visit to our clinic she had an A1c of 9.0%  , she was on Glimepiride and metformin . 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 We increased  Glipizide and continued Metformin and Trulicity .  Today (08/16/2020): Ms. Wilbon is here for a f/u on diabetes.  She  Has been checking 4 times a week. The patient has not had hypoglycemic episodes since the last clinic visit.    Denies nausea or diarrhea  She is not compliant with rosuvastatin, she self reduced it to  three times a week but has started OTC fish oil    HOME DIABETES REGIMEN:  Glipizide 5 mg, 1.5 tablets BID Metformin 850 mg Twice daily  Trulicity 1.5 mg weekly (Monday)     Statin: Yes ACE-I/ARB: yes    METER DOWNLOAD SUMMARY: Did not bring    DIABETIC COMPLICATIONS: Microvascular complications:    Denies: CKD, retinopathy , neuropathy  Last eye exam: Completed 03/2020   Macrovascular complications:    Denies: CAD, PVD, CVA   HISTORY:  Past Medical  History:  Past Medical History:  Diagnosis Date   Anemia    Anxiety    Depression    Diabetes mellitus    Hyperlipidemia    Hypertension    PCOS (polycystic ovarian syndrome)    Uterine fibroid    Vitamin B12 deficiency    Past Surgical History:  Past Surgical History:  Procedure Laterality Date   CESAREAN SECTION     CHOLECYSTECTOMY     g1 p1     LAPAROSCOPIC GELPORT ASSISTED MYOMECTOMY  02/12/2015   Dr Barbie Banner   Social History:  reports that she has never smoked. She has never used smokeless tobacco. She reports that she does not drink alcohol and does not use drugs. Family History:  Family History  Problem Relation Age of Onset   Migraines Other    Diabetes Mother    Hypertension Mother    Hyperlipidemia Mother    Kidney disease Mother    Thyroid disease Mother    Anxiety disorder Mother    Diabetes Father    Hypertension Father    Anxiety disorder Father    Liver disease Father    Alcoholism Father    Drug abuse Father    Depression Father    Diabetes Brother      HOME MEDICATIONS: Allergies as of 08/16/2020       Reactions   Sulfa Antibiotics Other (See  Comments), Rash   Sulfonamide Derivatives Photosensitivity, Rash, Other (See Comments)   Severe headache        Medication List        Accurate as of August 16, 2020  7:53 AM. If you have any questions, ask your nurse or doctor.          ALPRAZolam 0.25 MG tablet Commonly known as: XANAX Take 1 tablet (0.25 mg total) by mouth 3 (three) times daily as needed.   Biotin 5000 MCG Tabs Take 5,000 mcg by mouth daily.   Blood Glucose Monitor System w/Device Kit use as drected to check blood sugar   diphenhydrAMINE 25 MG tablet Commonly known as: BENADRYL Take 50 mg by mouth at bedtime as needed for sleep.   DULoxetine 60 MG capsule Commonly known as: CYMBALTA Take 1 capsule (60 mg total) by mouth daily.   Fluarix Quadrivalent 0.5 ML injection Generic drug: influenza vac split quadrivalent  PF TO BE ADMINISTERED BY PHARMACIST   FREESTYLE LITE test strip Generic drug: glucose blood Use as instructed to check blood sugar 2 times daily   glipiZIDE 5 MG tablet Commonly known as: GLUCOTROL Take 1.5 tablets (7.5 mg total) by mouth 2 (two) times daily before a meal. What changed:  how much to take how to take this when to take this Changed by: [name], MD   ibuprofen 600 MG tablet Commonly known as: ADVIL Take 1 tablet (600 mg total) by mouth every 6 (six) hours as needed.   losartan 25 MG tablet Commonly known as: COZAAR Take 1 tablet (25 mg total) by mouth daily.   metFORMIN 850 MG tablet Commonly known as: GLUCOPHAGE Take 1 tablet (850 mg total) by mouth 2 (two) times daily with a meal. What changed:  how much to take how to take this when to take this Changed by: [name], MD   multivitamin with minerals Tabs tablet Take 1 tablet by mouth daily.   nystatin powder Commonly known as: nystatin Apply 1 application topically 3 (three) times daily.   rosuvastatin 20 MG tablet Commonly known as: CRESTOR Take 1 tablet (20 mg total) by mouth daily. What changed: how much to take Changed by: [name], MD   Trulicity 1.5 OQ/9.4TM Sopn Generic drug: Dulaglutide Inject 1.5 mg into the skin once a week.         OBJECTIVE:   Vital Signs: BP 120/80   Pulse 97   Wt 199 lb 9.6 oz (90.5 kg)   LMP 05/22/2019   SpO2 97%   BMI 32.22 kg/m   Wt Readings from Last 3 Encounters:  08/16/20 199 lb 9.6 oz (90.5 kg)  08/14/20 200 lb 3.2 oz (90.8 kg)  04/13/20 205 lb (93 kg)     Exam: General: Pt appears well and is in NAD  Lungs: Clear with good BS bilat with no rales, rhonchi, or wheezes  Heart: RRR with normal S1 and S2 and no gallops; no murmurs; no rub  Abdomen: Normoactive bowel sounds, soft, nontender, without masses or organomegaly palpable  Extremities: No pretibial edema.   Neuro: MS is good with appropriate  affect, pt is alert and Ox3    DM foot exam:04/13/2020   The skin of the feet is without sores or ulcerations, Right great toe nail is thickened and curved . Left great toe nail is discolored and brittle.  The pedal pulses are 2+ on right and 2+ on left. The sensation is intact to a screening 5.07,  10 gram monofilament bilaterally    DATA REVIEWED:  Lab Results  Component Value Date   HGBA1C 6.5 08/14/2020   HGBA1C 7.1 (A) 04/13/2020   HGBA1C 6.8 (A) 07/19/2019   Results for EDLIN, FORD (MRN [id number]) as of 08/16/2020 07:02  Ref. Range 08/14/2020 13:45  Sodium Latest Ref Range: 135 - 145 mEq/L 137  Potassium Latest Ref Range: 3.5 - 5.1 mEq/L 4.2  Chloride Latest Ref Range: 96 - 112 mEq/L 103  CO2 Latest Ref Range: 19 - 32 mEq/L 24  Glucose Latest Ref Range: 70 - 99 mg/dL 150 (H)  BUN Latest Ref Range: 6 - 23 mg/dL 6  Creatinine Latest Ref Range: 0.40 - 1.20 mg/dL 0.62  Calcium Latest Ref Range: 8.4 - 10.5 mg/dL 9.8  Alkaline Phosphatase Latest Ref Range: 39 - 117 U/L 106  Albumin Latest Ref Range: 3.5 - 5.2 g/dL 4.0  AST Latest Ref Range: 0 - 37 U/L 13  ALT Latest Ref Range: 0 - 35 U/L 14  Total Protein Latest Ref Range: 6.0 - 8.3 g/dL 7.4  Total Bilirubin Latest Ref Range: 0.2 - 1.2 mg/dL 0.2  GFR Latest Ref Range: >60.00 mL/min 106.15  Total CHOL/HDL Ratio Unknown 4  Cholesterol Latest Ref Range: 0 - 200 mg/dL 188  HDL Cholesterol Latest Ref Range: >39.00 mg/dL 44.70  LDL (calc) Latest Ref Range: 0 - 99 mg/dL 124 (H)  MICROALB/CREAT RATIO Latest Ref Range: 0.0 - 30.0 mg/g 1.0  NonHDL Unknown 143.43  Triglycerides Latest Ref Range: 0.0 - 149.0 mg/dL 95.0  VLDL Latest Ref Range: 0.0 - 40.0 mg/dL 19.0  WBC Latest Ref Range: 4.0 - 10.5 K/uL 5.6  RBC Latest Ref Range: 3.87 - 5.11 Mil/uL 4.11  Hemoglobin Latest Ref Range: 12.0 - 15.0 g/dL 10.0 (L)  HCT Latest Ref Range: 36.0 - 46.0 % 31.3 (L)  MCV Latest Ref Range: 78.0 - 100.0 fl 76.2 (L)  MCHC Latest Ref Range: 30.0 -  36.0 g/dL 31.9  RDW Latest Ref Range: 11.5 - 15.5 % 16.9 (H)  Platelets Latest Ref Range: 150.0 - 400.0 K/uL 382.0  Neutrophils Latest Ref Range: 43.0 - 77.0 % 58.4  Lymphocytes Latest Ref Range: 12.0 - 46.0 % 32.8  Monocytes Relative Latest Ref Range: 3.0 - 12.0 % 5.2  Eosinophil Latest Ref Range: 0.0 - 5.0 % 2.8  Basophil Latest Ref Range: 0.0 - 3.0 % 0.8  NEUT# Latest Ref Range: 1.4 - 7.7 K/uL 3.3  Lymphocyte # Latest Ref Range: 0.7 - 4.0 K/uL 1.8  Monocyte # Latest Ref Range: 0.1 - 1.0 K/uL 0.3  Eosinophils Absolute Latest Ref Range: 0.0 - 0.7 K/uL 0.2  Basophils Absolute Latest Ref Range: 0.0 - 0.1 K/uL 0.0  Hemoglobin A1C Latest Ref Range: 4.6 - 6.5 % 6.5  TSH Latest Ref Range: 0.35 - 5.50 uIU/mL 1.36  Results for CHRISTAIN, MCRANEY (MRN [id number]) as of 08/16/2020 07:02  Ref. Range 08/14/2020 13:45  Creatinine,U Latest Units: mg/dL 239.8  Microalb, Ur Latest Ref Range: 0.0 - 1.9 mg/dL 2.3 (H)  MICROALB/CREAT RATIO Latest Ref Range: 0.0 - 30.0 mg/g 1.0    ASSESSMENT / PLAN / RECOMMENDATIONS:    1) Type 2 Diabetes Mellitus, Optimally controlled,  Without complications - Most recent A1c of 6.5 %. Goal A1c < 7.0 %.    - I have praised the pt on weight loss and optimizing glucose control  - I have encouraged her to continue checking glucose a few times a week   MEDICATIONS: - Continue Glipizide  22m, one and a  Half tablet before Breakfast and one and a half a tablet before Supper - Continue Metformin 850 mg Twice daily  - Continue  Trulicity 1.5 mg weekly    EDUCATION / INSTRUCTIONS: BG monitoring instructions: Patient is instructed to check her blood sugars 3 times a week [facility] if: BG persistently < 70  I reviewed the Rule of 15 for the treatment of hypoglycemia in detail with the patient. Literature supplied.   2) Diabetic complications:  Eye: Does not have known diabetic retinopathy. Pt urged to have an updated eye exam  Neuro/ Feet: Does not have  known diabetic peripheral neuropathy. Renal: Patient does not have known baseline CKD. She is on an ACEI/ARB at present.   3) Dyslipidemia :   - Her LDL has increased to 124 mg/dL, we discussed this is above goal. She has self reduced Rosuvastatin to 3x a week because she started OTC fish oil. We discussed the cardiovascular benefits of statins , she was advised she can take statins and fish oil together.   - Will refill    Medication    Rosuvastatin 20 mg daily    F/U in 6 months    Signed electronically by: AMack Guise MD  [facility]  [address] [address] [address] [place] [place] [address]Phone: [phone]: [phone]  CC: LAnn Held DO [address] [place]Phone: [phone] Fax: [phone] Return to Endocrinology clinic as below: Future Appointments  Date Time Provider DBeaver 02/11/2021  1:00 PM LAnn Held DO [id number]

## 2020-08-16 NOTE — Patient Instructions (Addendum)
-   Keep Up the Good Work !  - Continue Glipizide 5  mg to ONE and a HALF tablets before Breakfast and Supper  - Continue Metformin 850 mg Twice daily  - Continue  Trulicity 1.5 mg weekly     Please take Rosuvastatin 20 mg daily     HOW TO TREAT LOW BLOOD SUGARS (Blood sugar LESS THAN 70 MG/DL) Please follow the RULE OF 15 for the treatment of hypoglycemia treatment (when your (blood sugars are less than 70 mg/dL)   STEP 1: Take 15 grams of carbohydrates when your blood sugar is low, which includes:  3-4 GLUCOSE TABS  OR 3-4 OZ OF JUICE OR REGULAR SODA OR ONE TUBE OF GLUCOSE GEL    STEP 2: RECHECK blood sugar in 15 MINUTES STEP 3: If your blood sugar is still low at the 15 minute recheck --> then, go back to STEP 1 and treat AGAIN with another 15 grams of carbohydrates.

## 2020-08-18 ENCOUNTER — Other Ambulatory Visit: Payer: Self-pay | Admitting: Family Medicine

## 2020-08-18 DIAGNOSIS — D649 Anemia, unspecified: Secondary | ICD-10-CM

## 2020-09-11 ENCOUNTER — Other Ambulatory Visit: Payer: Self-pay | Admitting: Family Medicine

## 2020-09-11 DIAGNOSIS — I10 Essential (primary) hypertension: Secondary | ICD-10-CM

## 2020-09-11 DIAGNOSIS — F411 Generalized anxiety disorder: Secondary | ICD-10-CM

## 2020-09-12 ENCOUNTER — Other Ambulatory Visit: Payer: Self-pay | Admitting: Family Medicine

## 2020-09-12 ENCOUNTER — Other Ambulatory Visit (HOSPITAL_COMMUNITY): Payer: Self-pay

## 2020-09-12 DIAGNOSIS — F411 Generalized anxiety disorder: Secondary | ICD-10-CM

## 2020-09-12 DIAGNOSIS — I10 Essential (primary) hypertension: Secondary | ICD-10-CM

## 2020-09-12 MED ORDER — LOSARTAN POTASSIUM 25 MG PO TABS
25.0000 mg | ORAL_TABLET | Freq: Every day | ORAL | 1 refills | Status: DC
  Filled 2020-09-12: qty 90, 90d supply, fill #0
  Filled 2020-12-31: qty 90, 90d supply, fill #1

## 2020-09-12 MED ORDER — ALPRAZOLAM 0.25 MG PO TABS
0.2500 mg | ORAL_TABLET | Freq: Three times a day (TID) | ORAL | 1 refills | Status: DC | PRN
  Filled 2020-09-12: qty 30, 10d supply, fill #0
  Filled 2020-10-14: qty 30, 10d supply, fill #1

## 2020-09-12 NOTE — Telephone Encounter (Signed)
Requesting: ALPRAZOLAM Contract:  UDS: 05/09/19 Last Visit: 08/14/20  Next Visit: 02/11/21 Last Refill: 07/13/20  Please Advise

## 2020-09-18 DIAGNOSIS — E119 Type 2 diabetes mellitus without complications: Secondary | ICD-10-CM | POA: Diagnosis not present

## 2020-09-18 DIAGNOSIS — H524 Presbyopia: Secondary | ICD-10-CM | POA: Diagnosis not present

## 2020-09-21 DIAGNOSIS — E282 Polycystic ovarian syndrome: Secondary | ICD-10-CM | POA: Insufficient documentation

## 2020-09-21 DIAGNOSIS — E538 Deficiency of other specified B group vitamins: Secondary | ICD-10-CM | POA: Insufficient documentation

## 2020-09-21 DIAGNOSIS — F32A Depression, unspecified: Secondary | ICD-10-CM | POA: Insufficient documentation

## 2020-09-24 ENCOUNTER — Other Ambulatory Visit: Payer: 59

## 2020-10-08 NOTE — Progress Notes (Deleted)
Cardiology Office Note:    Date:  10/08/2020   ID:  Cheryl Hart, DOB May 19, 1975, MRN [id number]  PCP:  Carollee Herter, Alferd Apa, DO  Cardiologist:  Shirlee More, MD   Referring MD: Carollee Herter, Alferd Apa, *  ASSESSMENT:    No diagnosis found. PLAN:    In order of problems listed above:  ***  Next appointment   Medication Adjustments/Labs and Tests Ordered: Current medicines are reviewed at length with the patient today.  Concerns regarding medicines are outlined above.  No orders of the defined types were placed in this encounter.  No orders of the defined types were placed in this encounter.    No chief complaint on file. ***  History of Present Illness:    Cheryl Hart is a 45 y.o. female with a history of hypertension hyperlipidemia type 2 diabetes and polycystic ovarian syndrome who is being seen today for the evaluation of palpitation at the request of Ann Held, *.   Past Medical History:  Diagnosis Date   Anemia    Anxiety    Depression    Diabetes mellitus    Hyperlipidemia    Hypertension    PCOS (polycystic ovarian syndrome)    Uterine fibroid    Vitamin B12 deficiency     Past Surgical History:  Procedure Laterality Date   CESAREAN SECTION     CHOLECYSTECTOMY     g1 p1     LAPAROSCOPIC GELPORT ASSISTED MYOMECTOMY  02/12/2015   Dr Barbie Banner    Current Medications: No outpatient medications have been marked as taking for the 10/09/20 encounter (Appointment) with Richardo Priest, MD.     Allergies:   Sulfa antibiotics and Sulfonamide derivatives   Social History   Socioeconomic History   Marital status: Single    Spouse name: Not on file   Number of children: Not on file   Years of education: Not on file   Highest education level: Not on file  Occupational History   Occupation: psych tech  Tobacco Use   Smoking status: Never   Smokeless tobacco: Never  Vaping Use   Vaping Use: Never used  Substance and Sexual Activity    Alcohol use: No   Drug use: No   Sexual activity: Yes    Partners: Male    Birth control/protection: Pill  Other Topics Concern   Not on file  Social History Narrative   Exercise-- no   Social Determinants of Health   Financial Resource Strain: Not on file  Food Insecurity: Not on file  Transportation Needs: Not on file  Physical Activity: Not on file  Stress: Not on file  Social Connections: Not on file     Family History: The patient's ***family history includes Alcoholism in her father; Anxiety disorder in her father and mother; Depression in her father; Diabetes in her brother, father, and mother; Drug abuse in her father; Hyperlipidemia in her mother; Hypertension in her father and mother; Kidney disease in her mother; Liver disease in her father; Migraines in an other family member; Thyroid disease in her mother.  ROS:   ROS Please see the history of present illness.    *** All other systems reviewed and are negative.  EKGs/Labs/Other Studies Reviewed:    The following studies were reviewed today: ***  EKG:  EKG is *** ordered today.  The ekg ordered today is personally reviewed and demonstrates ***  Recent Labs: 08/14/2020: ALT 14; BUN 6; Creatinine, Ser 0.62; Hemoglobin 10.0; Platelets  382.0; Potassium 4.2; Sodium 137; TSH 1.36  Recent Lipid Panel    Component Value Date/Time   CHOL 188 08/14/2020 1345   CHOL 161 02/09/2019 1208   TRIG 95.0 08/14/2020 1345   HDL 44.70 08/14/2020 1345   HDL 41 02/09/2019 1208   CHOLHDL 4 08/14/2020 1345   VLDL 19.0 08/14/2020 1345   LDLCALC 124 (H) 08/14/2020 1345   LDLCALC 95 02/09/2019 1208   LDLDIRECT 92.0 02/01/2015 1341    Physical Exam:    VS:  LMP 05/22/2019     Wt Readings from Last 3 Encounters:  08/16/20 199 lb 9.6 oz (90.5 kg)  08/14/20 200 lb 3.2 oz (90.8 kg)  04/13/20 205 lb (93 kg)     GEN: *** Well nourished, well developed in no acute distress HEENT: Normal NECK: No JVD; No carotid  bruits LYMPHATICS: No lymphadenopathy CARDIAC: ***RRR, no murmurs, rubs, gallops RESPIRATORY:  Clear to auscultation without rales, wheezing or rhonchi  ABDOMEN: Soft, non-tender, non-distended MUSCULOSKELETAL:  No edema; No deformity  SKIN: Warm and dry NEUROLOGIC:  Alert and oriented x 3 PSYCHIATRIC:  Normal affect     Signed, Shirlee More, MD  10/08/2020 3:27 PM    [facility]

## 2020-10-09 ENCOUNTER — Ambulatory Visit: Payer: 59 | Admitting: Cardiology

## 2020-10-14 ENCOUNTER — Other Ambulatory Visit: Payer: Self-pay | Admitting: Family Medicine

## 2020-10-14 DIAGNOSIS — F339 Major depressive disorder, recurrent, unspecified: Secondary | ICD-10-CM

## 2020-10-14 DIAGNOSIS — IMO0002 Reserved for concepts with insufficient information to code with codable children: Secondary | ICD-10-CM

## 2020-10-14 DIAGNOSIS — E1142 Type 2 diabetes mellitus with diabetic polyneuropathy: Secondary | ICD-10-CM

## 2020-10-15 ENCOUNTER — Other Ambulatory Visit (HOSPITAL_COMMUNITY): Payer: Self-pay

## 2020-10-15 MED ORDER — DULOXETINE HCL 60 MG PO CPEP
60.0000 mg | ORAL_CAPSULE | Freq: Every day | ORAL | 1 refills | Status: DC
  Filled 2020-10-15: qty 90, 90d supply, fill #0
  Filled 2020-11-02 – 2021-01-10 (×2): qty 90, 90d supply, fill #1

## 2020-11-02 ENCOUNTER — Other Ambulatory Visit (HOSPITAL_COMMUNITY): Payer: Self-pay

## 2020-11-12 ENCOUNTER — Other Ambulatory Visit (HOSPITAL_COMMUNITY): Payer: Self-pay

## 2020-11-14 ENCOUNTER — Other Ambulatory Visit (HOSPITAL_COMMUNITY): Payer: Self-pay

## 2020-11-22 ENCOUNTER — Other Ambulatory Visit (HOSPITAL_COMMUNITY): Payer: Self-pay

## 2020-12-04 ENCOUNTER — Other Ambulatory Visit (HOSPITAL_COMMUNITY): Payer: Self-pay

## 2020-12-04 ENCOUNTER — Other Ambulatory Visit: Payer: Self-pay | Admitting: Family Medicine

## 2020-12-04 DIAGNOSIS — F411 Generalized anxiety disorder: Secondary | ICD-10-CM

## 2020-12-05 ENCOUNTER — Other Ambulatory Visit (HOSPITAL_COMMUNITY): Payer: Self-pay

## 2020-12-05 MED ORDER — ALPRAZOLAM 0.25 MG PO TABS
0.2500 mg | ORAL_TABLET | Freq: Three times a day (TID) | ORAL | 1 refills | Status: DC | PRN
  Filled 2020-12-05: qty 30, 10d supply, fill #0
  Filled 2020-12-31: qty 30, 10d supply, fill #1

## 2020-12-05 NOTE — Telephone Encounter (Signed)
Requesting: alprazolam 0.25mg  Contract: 04/18/2019 UDS: 04/18/2019 Last Visit: 08/14/2020 Next Visit: 02/11/2021 Last Refill: 09/12/2020 #30 and 0RF  Please Advise

## 2020-12-13 ENCOUNTER — Other Ambulatory Visit (HOSPITAL_COMMUNITY): Payer: Self-pay

## 2020-12-13 DIAGNOSIS — M1711 Unilateral primary osteoarthritis, right knee: Secondary | ICD-10-CM | POA: Diagnosis not present

## 2020-12-13 DIAGNOSIS — M25561 Pain in right knee: Secondary | ICD-10-CM | POA: Diagnosis not present

## 2020-12-13 MED ORDER — NAPROXEN 500 MG PO TABS
500.0000 mg | ORAL_TABLET | Freq: Two times a day (BID) | ORAL | 1 refills | Status: DC | PRN
  Filled 2020-12-13: qty 60, 30d supply, fill #0
  Filled 2021-01-23: qty 60, 30d supply, fill #1

## 2021-01-01 ENCOUNTER — Other Ambulatory Visit (HOSPITAL_COMMUNITY): Payer: Self-pay

## 2021-01-10 ENCOUNTER — Other Ambulatory Visit (HOSPITAL_COMMUNITY): Payer: Self-pay

## 2021-01-15 ENCOUNTER — Encounter: Payer: Self-pay | Admitting: Physical Therapy

## 2021-01-15 ENCOUNTER — Other Ambulatory Visit: Payer: Self-pay

## 2021-01-15 ENCOUNTER — Ambulatory Visit: Payer: 59 | Attending: Sports Medicine | Admitting: Physical Therapy

## 2021-01-15 DIAGNOSIS — M25661 Stiffness of right knee, not elsewhere classified: Secondary | ICD-10-CM | POA: Diagnosis not present

## 2021-01-15 DIAGNOSIS — G8929 Other chronic pain: Secondary | ICD-10-CM | POA: Insufficient documentation

## 2021-01-15 DIAGNOSIS — M25561 Pain in right knee: Secondary | ICD-10-CM | POA: Diagnosis not present

## 2021-01-15 DIAGNOSIS — M6281 Muscle weakness (generalized): Secondary | ICD-10-CM | POA: Diagnosis not present

## 2021-01-15 NOTE — Therapy (Signed)
OUTPATIENT PHYSICAL THERAPY LOWER EXTREMITY EVALUATION   Patient Name: Cheryl Hart MRN: [id number] DOB:Oct 18, 1975, 46 y.o., female Today's Date: 01/15/2021   PT End of Session - 01/15/21 0944     Visit Number 1    Number of Visits 12    Date for PT Re-Evaluation 02/26/21    Authorization Type UMR    PT Start Time 0932    PT Stop Time 4098    PT Time Calculation (min) 43 min    Activity Tolerance Patient tolerated treatment well    Behavior During Therapy WFL for tasks assessed/performed             Past Medical History:  Diagnosis Date   Anemia    Anxiety    Depression    Diabetes mellitus    Hyperlipidemia    Hypertension    PCOS (polycystic ovarian syndrome)    Uterine fibroid    Vitamin B12 deficiency    Past Surgical History:  Procedure Laterality Date   CESAREAN SECTION     CHOLECYSTECTOMY     g1 p1     LAPAROSCOPIC GELPORT ASSISTED MYOMECTOMY  02/12/2015   Dr Barbie Banner   Patient Active Problem List   Diagnosis Date Noted   Depression 09/21/2020   PCOS (polycystic ovarian syndrome) 09/21/2020   Vitamin B12 deficiency 09/21/2020   Type 2 diabetes mellitus without complication, without long-term current use of insulin ([place]) 08/16/2020   Preventative health care 08/14/2020   Type 2 diabetes mellitus with hyperglycemia, without long-term current use of insulin ([place]) 04/13/2020   Toe pain, bilateral 04/13/2020   Generalized anxiety disorder 04/18/2019   Diabetic polyneuropathy associated with type 2 diabetes mellitus ([place]) 04/18/2019   Anemia    Dyslipidemia 12/01/2018   Tachycardia 12/01/2018   HTN (hypertension) 10/01/2018   Fibroids    Fibroid, uterine    Obesity (BMI 30-39.9) 02/21/2013   Hyperlipidemia 03/13/2011   MORBID OBESITY 08/08/2008   Uncontrolled type 2 diabetes mellitus with complication, without long-term current use of insulin 03/31/2008   UNSPECIFIED ANEMIA 03/31/2008   GLYCOSURIA 03/29/2008   LIVER FUNCTION TESTS, ABNORMAL, HX OF  02/28/2007   ABDOMINAL PAIN 02/17/2007   ANXIETY 11/18/2006   GESTATIONAL DIABETES 11/18/2006   FATIGUE 11/18/2006   HEADACHE 11/18/2006   ABDOMINAL PAIN, EPIGASTRIC 11/18/2006    PCP: Ann Held, DO  REFERRING PROVIDER: Pedro Earls, MD  REFERRING DIAG: [phone] (ICD-10-CM) - Pain in right knee  THERAPY DIAG:  Chronic pain of right knee  Muscle weakness (generalized)  Stiffness of right knee, not elsewhere classified  ONSET DATE: September/Oct 2022  SUBJECTIVE:   SUBJECTIVE STATEMENT: I have had knee pain for years but I notice that I have been getting worse pain and swelling for 3 months. Pain keeps me up at night, sometimes I stay up all night it is throbbing and burning.  My daughter helps me get dressed with socks.  I probably only get 3 hours of sleep max.  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 Yes VAS scale: 5/10 with Naprosyn  Worst is 9/10 Pain location: R back of knee Pain orientation: Other: chronic  especially in mornings  in back of R knee over muscle , not joint line PAIN TYPE: tight and stiff sharp pain at times Pain description: constant and     Aggravating  factors: sleeping at night, bending knee, going up and down steps, at work I sit at computer registering Relieving factors: medicine, morning  Pt can sit for one hour only,   Standing is not a problems, I can walk walk 400 feet but no more without limping  PRECAUTIONS: None  WEIGHT BEARING RESTRICTIONS No  FALLS:  Has patient fallen in last 6 months? No, Number of falls: 0  LIVING ENVIRONMENT: Lives with: lives with their family and lives with their daughter including mother Lives in: House/apartment Stairs: No; External: 3 steps; Rail on both going up Has following equipment at home: None except long knee brace with  butress.  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  Sleep better at night.   Go up and down steps   OBJECTIVE:   DIAGNOSTIC FINDINGS:  Knee swelling   R 46 cm, L 39 cm  PATIENT SURVEYS:  FOTO Intake 49 predicted 66  COGNITION:  Overall cognitive status: Within functional limits for tasks assessed     SENSATION:  Light touch: Appears intact  Stereognosis: Appears intact  Hot/Cold: Appears intact  Proprioception: Appears intact  MUSCLE LENGTH: Hamstrings: Right 65 deg; Left 80 deg Thomas test: Right neg deg; Left neg deg  POSTURE:  Slightly rounded shoulders.  Slight forward head.  Anterior pelvic tilt wt shift to the Left  PALPATION: Pt used to have anterior pain but no pain to palpation on anterior R knee.  Pt with minimal pain over post knee mostly over muscles as indicated. Pain over R lateral gastroc and R later hamstring.   LE AROM/PROM:  A/PROM Right 01/15/2021 Left 01/15/2021  Hip flexion 120 123  Hip extension    Hip abduction    Hip adduction    Hip internal rotation    Hip external rotation    Knee flexion 95 pain in back of knee P115 125/P130  Knee extension 5 0  Ankle dorsiflexion 9 12  Ankle plantarflexion    Ankle inversion    Ankle eversion     (Blank rows = not tested)  LE MMT:  MMT Right 01/15/2021 Left 01/15/2021  Hip flexion 5/5 5/5  Hip extension 5/5 5/5  Hip abduction 4/5 4+/5  Hip adduction    Hip internal rotation    Hip external rotation    Knee flexion 4/5 pain 5/5  Knee extension 4+/5 5/5  Ankle dorsiflexion    Ankle plantarflexion    Ankle inversion    Ankle eversion     (Blank rows = not tested)  LOWER EXTREMITY SPECIAL TESTS:  Hip special tests: Saralyn Pilar (FABER) test: negative and Thomas  test: negative Knee special tests: Anterior drawer test: negative and Posterior drawer test: negative  FUNCTIONAL TESTS:  5 times sit to stand: 20.40 sec  Pt wt bears to left  with R foot advanced to decrease wt bearing.  6 minute walk test: not done today Squat- leans to left in squat at 60 degrees  GAIT: Distance walked: 150 Assistive device utilized: None Level of assistance: Complete Independence Comments: antalgic gait  using knee brace and wt shifting more to the Left decreased wt on R and decreased stance time on R LE    TODAY'S TREATMENT: Access Code: [email or web address]  URL: [email or web address] Date: 01/15/2021 Prepared by: Voncille Lo  Exercises R leg only Long Sitting Quad Set with Towel Roll Under Heel 10 x 2 reps Supine Active Straight Leg Raise -  10 Supine Heel Slides - 10 reps Seated Long Arc Quad - 10 reps    PATIENT EDUCATION:  Education details: POC Explanation of findings  intial HEP Person educated: Patient Education method: Explanation, Demonstration, Tactile cues, Verbal cues, and Handouts Education comprehension: verbalized understanding, returned demonstration, and verbal cues required   HOME EXERCISE PROGRAM: Access Code: [email or web address] URL: [email or web address] Date: 01/15/2021 Prepared by: Voncille Lo  Exercises Long Sitting Quad Set with Towel Roll Under Heel - 1 x daily - 7 x weekly - 3 sets - 10 reps Supine Active Straight Leg Raise - 1 x daily - 7 x weekly - 3 sets - 15 reps Supine Heel Slides - 1 x daily - 7 x weekly - 3 sets - 15 reps Seated Long Arc Quad - 1 x daily - 7 x weekly - 3 sets - 15 reps  ASSESSMENT:  CLINICAL IMPRESSION: Patient is a 46 y.o. female who was seen today for physical therapy evaluation and treatment for R knee pain and edema mostly painful posteriorly.  Ms Pridgen was seen by Dr Delilah Shan who suspects possible meniscus tear but pt presents today with muscular pain dominating in  evaluation. 5 x STS 20.40 sec and pt compensates by wt shifting heavily on L LE   Pt squats with compensation of  L wt bearing and lifts heel on R during squat.  Pt has tenderness to palpation over  R lateral hamstring and lateral R gastroc.. Objective impairments include Abnormal gait, decreased mobility, difficulty walking, decreased ROM, decreased strength, increased muscle spasms, and pain. These impairments are limiting patient from cleaning, meal prep, occupation, laundry, and Pt in school for social work and needs to stand for internships . Personal factors including Profession are also affecting patient's functional outcome. Patient will benefit from skilled PT to address above impairments and improve overall function.  REHAB POTENTIAL: Excellent  CLINICAL DECISION MAKING: Stable/uncomplicated  EVALUATION COMPLEXITY: Low   GOALS: Goals reviewed with patient? Yes partially  SHORT TERM GOALS:  STG Name Target Date Goal status  1 Pt will be independent with intial HEP Baseline: no knowledge 02/05/2021 INITIAL  2 Pt will be able to perform sit to stand with even wt shift bil Baseline: Pt wt shifts to left with sit to stand 02/05/2021 INITIAL  3 Pt will decrease pain with functional activities by 25% Baseline:at worst 9/10 , today 5/10 02/05/2021 INITIAL   LONG TERM GOALS:   LTG Name Target Date Goal status  1 Pt will be independent with advanced HEP as given Baseline: 02/26/2021 INITIAL  2 Pt will be  able to sit  and get up for movement during job for 8 hour shift with 2/10 or less pain Baseline: Pt now unable to sit for longer than 1 hour at a time. 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 Dry needling as needed.  Start with hamstring stretch and gastroc stretch, review HEP  Voncille Lo, PT, [place] Certified Exercise Expert for the Aging Adult  01/15/21 1:12 PM Phone: [phone] Fax: [phone]

## 2021-01-15 NOTE — Patient Instructions (Signed)
Access Code: [email or web address] URL: [email or web address] Date: 01/15/2021 Prepared by: Voncille Lo  Exercises Long Sitting Quad Set with Towel Roll Under Heel - 1 x daily - 7 x weekly - 3 sets - 10 reps Supine Active Straight Leg Raise - 1 x daily - 7 x weekly - 3 sets - 15 reps Supine Heel Slides - 1 x daily - 7 x weekly - 3 sets - 15 reps Seated Long Arc Quad - 1 x daily - 7 x weekly - 3 sets - 15 reps  Voncille Lo, PT, [facility] Certified Exercise Expert for the Aging Adult  01/15/21 10:03 AM Phone: [phone] Fax: [phone]

## 2021-01-17 ENCOUNTER — Encounter: Payer: Self-pay | Admitting: Physical Therapy

## 2021-01-17 ENCOUNTER — Ambulatory Visit: Payer: 59 | Admitting: Physical Therapy

## 2021-01-17 ENCOUNTER — Other Ambulatory Visit: Payer: Self-pay

## 2021-01-17 DIAGNOSIS — M25661 Stiffness of right knee, not elsewhere classified: Secondary | ICD-10-CM

## 2021-01-17 DIAGNOSIS — M25561 Pain in right knee: Secondary | ICD-10-CM | POA: Diagnosis not present

## 2021-01-17 DIAGNOSIS — M6281 Muscle weakness (generalized): Secondary | ICD-10-CM | POA: Diagnosis not present

## 2021-01-17 DIAGNOSIS — G8929 Other chronic pain: Secondary | ICD-10-CM

## 2021-01-17 NOTE — Therapy (Incomplete)
OUTPATIENT PHYSICAL THERAPY TREATMENT NOTE   Patient Name: Cheryl Hart MRN: [id number] DOB:[birth year], 46 y.o., female Today's Date: 01/17/2021  PCP: Ann Held, DO REFERRING PROVIDER: Pedro Earls, MD     Past Medical History:  Diagnosis Date   Anemia    Anxiety    Depression    Diabetes mellitus    Hyperlipidemia    Hypertension    PCOS (polycystic ovarian syndrome)    Uterine fibroid    Vitamin B12 deficiency    Past Surgical History:  Procedure Laterality Date   CESAREAN SECTION     CHOLECYSTECTOMY     g1 p1     LAPAROSCOPIC GELPORT ASSISTED MYOMECTOMY  02/12/2015   Dr Barbie Banner   Patient Active Problem List   Diagnosis Date Noted   Depression 09/21/2020   PCOS (polycystic ovarian syndrome) 09/21/2020   Vitamin B12 deficiency 09/21/2020   Type 2 diabetes mellitus without complication, without long-term current use of insulin ([place]) 08/16/2020   Preventative health care 08/14/2020   Type 2 diabetes mellitus with hyperglycemia, without long-term current use of insulin ([place]) 04/13/2020   Toe pain, bilateral 04/13/2020   Generalized anxiety disorder 04/18/2019   Diabetic polyneuropathy associated with type 2 diabetes mellitus ([place]) 04/18/2019   Anemia    Dyslipidemia 12/01/2018   Tachycardia 12/01/2018   HTN (hypertension) 10/01/2018   Fibroids    Fibroid, uterine    Obesity (BMI 30-39.9) 02/21/2013   Hyperlipidemia 03/13/2011   MORBID OBESITY 08/08/2008   Uncontrolled type 2 diabetes mellitus with complication, without long-term current use of insulin 03/31/2008   UNSPECIFIED ANEMIA 03/31/2008   GLYCOSURIA 03/29/2008   LIVER FUNCTION TESTS, ABNORMAL, HX OF 02/28/2007   ABDOMINAL PAIN 02/17/2007   ANXIETY 11/18/2006   GESTATIONAL DIABETES 11/18/2006   FATIGUE 11/18/2006   HEADACHE 11/18/2006   ABDOMINAL PAIN, EPIGASTRIC 11/18/2006    REFERRING DIAG: M25.561 (ICD-10-CM) - Pain in right knee  THERAPY DIAG:  No diagnosis found.  PERTINENT  HISTORY: PCOS, DM, anxiety, depression, Hyperlipidema, uterine fibroid  PRECAUTIONS: none  SUBJECTIVE: I am feeling so much better   I am a 4/10 and I can bend my knee so much better  PAIN:  Are you having pain? Yes VAS scale: 4/10 better than on eval Pain location: back of knee Pain orientation: Right and Posterior  PAIN TYPE: aching Pain description: constant  especially at night Aggravating factors: at night when stretching Relieving factors: seems like exercise is helping     OBJECTIVE:  all objective findings taken on eval except where otherwise noted   DIAGNOSTIC FINDINGS:  Knee swelling   R 46 cm, L 39 cm   PATIENT SURVEYS:  FOTO Intake 49 predicted 66   COGNITION:          Overall cognitive status: Within functional limits for tasks assessed                        SENSATION:          Light touch: Appears intact          Stereognosis: Appears intact          Hot/Cold: Appears intact          Proprioception: Appears intact   MUSCLE LENGTH: Hamstrings: Right 65 deg; Left 80 deg Thomas test: Right neg deg; Left neg deg   POSTURE:  Slightly rounded shoulders.  Slight forward head.  Anterior pelvic tilt wt shift to the Left   PALPATION: Pt  used to have anterior pain but no pain to palpation on anterior R knee.  Pt with minimal pain over post knee mostly over muscles as indicated. Pain over R lateral gastroc and R later hamstring.    LE AROM/PROM:   A/PROM Right 01/15/2021 Left 01/15/2021 Right 01/17/2021  Hip flexion 120 123   Hip extension       Hip abduction       Hip adduction       Hip internal rotation       Hip external rotation       Knee flexion 95 pain in back of knee P115 125/P130 115  Knee extension 5 0   Ankle dorsiflexion 9 12   Ankle plantarflexion       Ankle inversion       Ankle eversion        (Blank rows = not tested)   LE MMT:   MMT Right 01/15/2021 Left 01/15/2021  Hip flexion 5/5 5/5  Hip extension 5/5 5/5  Hip abduction 4/5 4+/5   Hip adduction      Hip internal rotation      Hip external rotation      Knee flexion 4/5 pain 5/5  Knee extension 4+/5 5/5  Ankle dorsiflexion      Ankle plantarflexion      Ankle inversion      Ankle eversion       (Blank rows = not tested)   LOWER EXTREMITY SPECIAL TESTS:  Hip special tests: Saralyn Pilar (FABER) test: negative and Thomas test: negative Knee special tests: Anterior drawer test: negative and Posterior drawer test: negative   FUNCTIONAL TESTS:  5 times sit to stand: 20.40 sec  Pt wt bears to left  with R foot advanced to decrease wt bearing.  6 minute walk test: not done today Squat- leans to left in squat at 60 degrees   GAIT: Distance walked: 150 Assistive device utilized: None Level of assistance: Complete Independence Comments: antalgic gait  using knee brace and wt shifting more to the Left decreased wt on R and decreased stance time on R LE       TODAY'S TREATMENT:  01-15-21 Exercises R leg only Long Sitting Quad Set with Towel Roll Under Heel 10 x 2 reps Supine Active Straight Leg Raise -  10 Supine Heel Slides - 10 reps Seated Long Arc Quad - 10 reps     [facility] Adult PT Treatment:                                                DATE: 01-17-21 Therapeutic Exercise:  Long Sitting Quad Set with Towel Roll Under Heel 10 x 3 reps Recumbent bike for 5 minutes at level 1  Supine Active Straight Leg Raise -  1 x 10 reps on R and then 3 lb with 2 x 10 on R Supine Heel Slides - 3 x 10 reps Seated Long Arc Quad - 3 x 10 reps Supine hamstring and IT band stretch with stretch out strap 30 sec to 1 minute each. Sidelying clamshell with GTB 3 x 10 on R and then L Gastroc stretch on L and R 30 sec x 3 facing wall Soleus stretch on L and R 30 sec x 3 facing wall    Access Code: [email or web address] Added today  Exercises Hooklying Hamstring Stretch with Strap -  2-3 x daily - 7 x weekly - 1 sets - 2-3 reps - 30sec to 1 minute hold Clamshell with Resistance - 1 x daily - 7 x  weekly - 3 sets - 10 reps Gastroc Stretch on Wall - 2-3 x daily - 7 x weekly - 1 sets - 3 reps - 30 sec hold Soleus Stretch on Wall - 2-3 x daily - 7 x weekly - 1 sets - 3 reps - 30 sec hold   [facility] Adult PT Treatment:                                                DATE: 01-22-21 Therapeutic Exercise: *** Manual Therapy: *** Neuromuscular re-ed: *** Therapeutic Activity: *** Modalities: *** Self Care: ***           PATIENT EDUCATION:  Education details: FOTO report and added to HEP for hip strength and gastroc stretch Person educated: Patient Education method: Explanation, Demonstration, Tactile cues, Verbal cues, and Handouts Education comprehension: verbalized understanding, returned demonstration, and verbal cues required     HOME EXERCISE PROGRAM: Access Code: [email or web address] URL: [email or web address] Date: 01/15/2021 Prepared by: Voncille Lo      ASSESSMENT:   CLINICAL IMPRESSION:   Pt enters clinic with knee brace and 4/10 pain but much improved from eval. Pt AROM of R knee 115 from 95 degrees on eval and  greater ease of movement.  Pt progressed with HEP today and will continue toward completion of goals Pt reports 2/10 pain at end of session    REHAB POTENTIAL: Excellent   CLINICAL DECISION MAKING: Stable/uncomplicated   EVALUATION COMPLEXITY: Low     GOALS: Goals reviewed with patient? Yes partially   SHORT TERM GOALS:   STG Name Target Date Goal status  1 Pt will be independent with intial HEP Baseline: no knowledge 02/05/2021 INITIAL  2 Pt will be able to perform sit to stand with even wt shift bil Baseline: Pt wt shifts to left with sit to stand 02/05/2021 INITIAL  3 Pt will decrease pain with functional activities by 25% Baseline:at worst 9/10 , today 5/10 02/05/2021 INITIAL    LONG TERM GOALS:    LTG Name Target Date Goal status  1 Pt will be independent with advanced HEP as given Baseline: 02/26/2021 INITIAL  2 Pt will be   able to sit  and get up for movement during job for 8 hour shift with 2/10 or less pain Baseline: Pt now unable to sit for longer than 1 hour at a time. 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 Dry needling as needed. Begin standing strengthening exercises, goblet squats, wall sit/ standing gastroc stretch on incline board.      Voncille Lo, PT, [place] Certified Exercise Expert for the Aging Adult  01/17/21 10:49 PM Phone: [phone] Fax: [phone]

## 2021-01-17 NOTE — Patient Instructions (Signed)
°  Access Code: [email or web address] URL: [email or web address] Date: 01/17/2021 Prepared by: Voncille Lo  Exercises added to program  Clamshell with Resistance - 1 x daily - 7 x weekly - 3 sets - 10 reps Seated Long Arc Quad - 1 x daily - 7 x weekly - 2 sets - 15 reps Gastroc Stretch on Wall - 2-3 x daily - 7 x weekly - 1 sets - 3 reps - 30 sec hold Soleus Stretch on Wall - 2-3 x daily - 7 x weekly - 1 sets - 3 reps - 30 sec hold  Voncille Lo, PT, [facility] Certified Exercise Expert for the Aging Adult  01/17/21 11:43 AM Phone: [phone] Fax: [phone]

## 2021-01-17 NOTE — Therapy (Signed)
OUTPATIENT PHYSICAL THERAPY TREATMENT NOTE   Patient Name: Cheryl Hart MRN: [id number] DOB:1975/03/20, 46 y.o., female Today's Date: 01/17/2021  PCP: Ann Held, DO REFERRING PROVIDER: Ann Held, *   PT End of Session - 01/17/21 1114     Visit Number 2    Number of Visits 12    Date for PT Re-Evaluation 02/26/21    Authorization Type UMR    PT Start Time 1105    PT Stop Time 4970    PT Time Calculation (min) 40 min    Activity Tolerance Patient tolerated treatment well    Behavior During Therapy WFL for tasks assessed/performed             Past Medical History:  Diagnosis Date   Anemia    Anxiety    Depression    Diabetes mellitus    Hyperlipidemia    Hypertension    PCOS (polycystic ovarian syndrome)    Uterine fibroid    Vitamin B12 deficiency    Past Surgical History:  Procedure Laterality Date   CESAREAN SECTION     CHOLECYSTECTOMY     g1 p1     LAPAROSCOPIC GELPORT ASSISTED MYOMECTOMY  02/12/2015   Dr Barbie Banner   Patient Active Problem List   Diagnosis Date Noted   Depression 09/21/2020   PCOS (polycystic ovarian syndrome) 09/21/2020   Vitamin B12 deficiency 09/21/2020   Type 2 diabetes mellitus without complication, without long-term current use of insulin ([place]) 08/16/2020   Preventative health care 08/14/2020   Type 2 diabetes mellitus with hyperglycemia, without long-term current use of insulin ([place]) 04/13/2020   Toe pain, bilateral 04/13/2020   Generalized anxiety disorder 04/18/2019   Diabetic polyneuropathy associated with type 2 diabetes mellitus ([place]) 04/18/2019   Anemia    Dyslipidemia 12/01/2018   Tachycardia 12/01/2018   HTN (hypertension) 10/01/2018   Fibroids    Fibroid, uterine    Obesity (BMI 30-39.9) 02/21/2013   Hyperlipidemia 03/13/2011   MORBID OBESITY 08/08/2008   Uncontrolled type 2 diabetes mellitus with complication, without long-term current use of insulin 03/31/2008   UNSPECIFIED ANEMIA 03/31/2008    GLYCOSURIA 03/29/2008   LIVER FUNCTION TESTS, ABNORMAL, HX OF 02/28/2007   ABDOMINAL PAIN 02/17/2007   ANXIETY 11/18/2006   GESTATIONAL DIABETES 11/18/2006   FATIGUE 11/18/2006   HEADACHE 11/18/2006   ABDOMINAL PAIN, EPIGASTRIC 11/18/2006    REFERRING DIAG: M25.561 (ICD-10-CM) - Pain in right knee  THERAPY DIAG:  Chronic pain of right knee  Muscle weakness (generalized)  Stiffness of right knee, not elsewhere classified  PERTINENT HISTORY: PCOS, DM, anxiety, depression, Hyperlipidema, uterine fibroid  PRECAUTIONS: none  SUBJECTIVE: I am feeling so much better   I am a 4/10 and I can bend my knee so much better  PAIN:  Are you having pain? Yes VAS scale: 4/10 better than on eval Pain location: back of knee Pain orientation: Right and Posterior  PAIN TYPE: aching Pain description: constant  especially at night Aggravating factors: at night when stretching Relieving factors: seems like exercise is helping     OBJECTIVE:  all objective findings taken on eval except where otherwise noted   DIAGNOSTIC FINDINGS:  Knee swelling   R 46 cm, L 39 cm   PATIENT SURVEYS:  FOTO Intake 49 predicted 66   COGNITION:          Overall cognitive status: Within functional limits for tasks assessed  SENSATION:          Light touch: Appears intact          Stereognosis: Appears intact          Hot/Cold: Appears intact          Proprioception: Appears intact   MUSCLE LENGTH: Hamstrings: Right 65 deg; Left 80 deg Thomas test: Right neg deg; Left neg deg   POSTURE:  Slightly rounded shoulders.  Slight forward head.  Anterior pelvic tilt wt shift to the Left   PALPATION: Pt used to have anterior pain but no pain to palpation on anterior R knee.  Pt with minimal pain over post knee mostly over muscles as indicated. Pain over R lateral gastroc and R later hamstring.    LE AROM/PROM:   A/PROM Right 01/15/2021 Left 01/15/2021 Right 01/17/2021  Hip flexion 120 123    Hip extension       Hip abduction       Hip adduction       Hip internal rotation       Hip external rotation       Knee flexion 95 pain in back of knee P115 125/P130 115  Knee extension 5 0   Ankle dorsiflexion 9 12   Ankle plantarflexion       Ankle inversion       Ankle eversion        (Blank rows = not tested)   LE MMT:   MMT Right 01/15/2021 Left 01/15/2021  Hip flexion 5/5 5/5  Hip extension 5/5 5/5  Hip abduction 4/5 4+/5  Hip adduction      Hip internal rotation      Hip external rotation      Knee flexion 4/5 pain 5/5  Knee extension 4+/5 5/5  Ankle dorsiflexion      Ankle plantarflexion      Ankle inversion      Ankle eversion       (Blank rows = not tested)   LOWER EXTREMITY SPECIAL TESTS:  Hip special tests: Saralyn Pilar (FABER) test: negative and Thomas test: negative Knee special tests: Anterior drawer test: negative and Posterior drawer test: negative   FUNCTIONAL TESTS:  5 times sit to stand: 20.40 sec  Pt wt bears to left  with R foot advanced to decrease wt bearing.  6 minute walk test: not done today Squat- leans to left in squat at 60 degrees   GAIT: Distance walked: 150 Assistive device utilized: None Level of assistance: Complete Independence Comments: antalgic gait  using knee brace and wt shifting more to the Left decreased wt on R and decreased stance time on R LE       TODAY'S TREATMENT:  [facility] Adult PT Treatment:                                                DATE: 01-17-21 Therapeutic Exercise:  Long Sitting Quad Set with Towel Roll Under Heel 10 x 3 reps Recumbent bike for 5 minutes at level 1  Supine Active Straight Leg Raise -  1 x 10 reps on R and then 3 lb with 2 x 10 on R Supine Heel Slides - 3 x 10 reps Seated Long Arc Quad - 3 x 10 reps Supine hamstring and IT band stretch with stretch out strap 30 sec to 1 minute each. Sidelying  clamshell with GTB 3 x 10 on R and then L Gastroc stretch on L and R 30 sec x 3 facing wall Soleus  stretch on L and R 30 sec x 3 facing wall    Access Code: [email or web address] Added today  Exercises Hooklying Hamstring Stretch with Strap - 2-3 x daily - 7 x weekly - 1 sets - 2-3 reps - 30sec to 1 minute hold Clamshell with Resistance - 1 x daily - 7 x weekly - 3 sets - 10 reps Gastroc Stretch on Wall - 2-3 x daily - 7 x weekly - 1 sets - 3 reps - 30 sec hold Soleus Stretch on Wall - 2-3 x daily - 7 x weekly - 1 sets - 3 reps - 30 sec hold        01-15-21 Exercises R leg only Long Sitting Quad Set with Towel Roll Under Heel 10 x 2 reps Supine Active Straight Leg Raise -  10 Supine Heel Slides - 10 reps Seated Long Arc Quad - 10 reps       PATIENT EDUCATION:  Education details: FOTO report and added to HEP for hip strength and gastroc stretch Person educated: Patient Education method: Explanation, Demonstration, Tactile cues, Verbal cues, and Handouts Education comprehension: verbalized understanding, returned demonstration, and verbal cues required     HOME EXERCISE PROGRAM: Access Code: [email or web address] URL: [email or web address] Date: 01/15/2021 Prepared by: Voncille Lo      ASSESSMENT:   CLINICAL IMPRESSION:   Pt enters clinic with knee brace and 4/10 pain but much improved from eval. Pt AROM of R knee 115 from 95 degrees on eval and  greater ease of movement.  Pt progressed with HEP today and will continue toward completion of goals Pt reports 2/10 pain at end of session    REHAB POTENTIAL: Excellent   CLINICAL DECISION MAKING: Stable/uncomplicated   EVALUATION COMPLEXITY: Low     GOALS: Goals reviewed with patient? Yes partially   SHORT TERM GOALS:   STG Name Target Date Goal status  1 Pt will be independent with intial HEP Baseline: no knowledge 02/05/2021 INITIAL  2 Pt will be able to perform sit to stand with even wt shift bil Baseline: Pt wt shifts to left with sit to stand 02/05/2021 INITIAL  3 Pt will decrease pain with functional activities  by 25% Baseline:at worst 9/10 , today 5/10 02/05/2021 INITIAL    LONG TERM GOALS:    LTG Name Target Date Goal status  1 Pt will be independent with advanced HEP as given Baseline: 02/26/2021 INITIAL  2 Pt will be  able to sit  and get up for movement during job for 8 hour shift with 2/10 or less pain Baseline: Pt now unable to sit for longer than 1 hour at a time. 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 Dry needling as needed. Begin standing strengthening exercises, goblet squats, wall sit/ standing gastroc stretch on incline board.      Voncille Lo, PT, [place] Certified Exercise Expert for the Aging Adult  01/17/21 12:01 PM Phone: [phone] Fax: [phone]

## 2021-01-22 ENCOUNTER — Ambulatory Visit: Payer: 59 | Admitting: Physical Therapy

## 2021-01-23 ENCOUNTER — Other Ambulatory Visit (HOSPITAL_COMMUNITY): Payer: Self-pay

## 2021-01-23 MED ORDER — METHYLPREDNISOLONE 4 MG PO TBPK
ORAL_TABLET | ORAL | 0 refills | Status: DC
  Filled 2021-01-23: qty 21, 6d supply, fill #0

## 2021-01-23 MED ORDER — ERYTHROMYCIN 5 MG/GM OP OINT
1.0000 "application " | TOPICAL_OINTMENT | OPHTHALMIC | 0 refills | Status: DC
  Filled 2021-01-23: qty 3.5, 7d supply, fill #0

## 2021-01-23 MED ORDER — FLUCONAZOLE 150 MG PO TABS
150.0000 mg | ORAL_TABLET | Freq: Every day | ORAL | 0 refills | Status: AC
  Filled 2021-01-23: qty 1, 1d supply, fill #0

## 2021-01-23 MED ORDER — CEPHALEXIN 500 MG PO CAPS
500.0000 mg | ORAL_CAPSULE | Freq: Two times a day (BID) | ORAL | 0 refills | Status: AC
  Filled 2021-01-23: qty 20, 10d supply, fill #0

## 2021-01-23 MED ORDER — ACYCLOVIR 800 MG PO TABS
800.0000 mg | ORAL_TABLET | Freq: Every day | ORAL | 0 refills | Status: AC
  Filled 2021-01-23: qty 35, 7d supply, fill #0

## 2021-01-24 ENCOUNTER — Encounter: Payer: Self-pay | Admitting: Physical Therapy

## 2021-01-24 ENCOUNTER — Ambulatory Visit: Payer: 59 | Admitting: Physical Therapy

## 2021-01-24 ENCOUNTER — Other Ambulatory Visit: Payer: Self-pay

## 2021-01-24 DIAGNOSIS — M25561 Pain in right knee: Secondary | ICD-10-CM | POA: Diagnosis not present

## 2021-01-24 DIAGNOSIS — M25661 Stiffness of right knee, not elsewhere classified: Secondary | ICD-10-CM | POA: Diagnosis not present

## 2021-01-24 DIAGNOSIS — G8929 Other chronic pain: Secondary | ICD-10-CM | POA: Diagnosis not present

## 2021-01-24 DIAGNOSIS — M6281 Muscle weakness (generalized): Secondary | ICD-10-CM

## 2021-01-24 NOTE — Therapy (Signed)
OUTPATIENT PHYSICAL THERAPY TREATMENT NOTE   Patient Name: Cheryl Hart MRN: [id number] DOB:13-Dec-1975, 46 y.o., female Today's Date: 01/24/2021  PCP: Ann Held, DO REFERRING PROVIDER: Pedro Earls, MD   PT End of Session - 01/24/21 1500     Visit Number 3    Number of Visits 12    Date for PT Re-Evaluation 02/26/21    Authorization Type U[employer]    PT Start Time 1500    PT Stop Time 6073    PT Time Calculation (min) 45 min    Activity Tolerance Patient tolerated treatment well    Behavior During Therapy WFL for tasks assessed/performed              Past Medical History:  Diagnosis Date   Anemia    Anxiety    Depression    Diabetes mellitus    Hyperlipidemia    Hypertension    PCOS (polycystic ovarian syndrome)    Uterine fibroid    Vitamin B12 deficiency    Past Surgical History:  Procedure Laterality Date   CESAREAN SECTION     CHOLECYSTECTOMY     g1 p1     LAPAROSCOPIC GELPORT ASSISTED MYOMECTOMY  02/12/2015   Dr Barbie Banner   Patient Active Problem List   Diagnosis Date Noted   Depression 09/21/2020   PCOS (polycystic ovarian syndrome) 09/21/2020   Vitamin B12 deficiency 09/21/2020   Type 2 diabetes mellitus without complication, without long-term current use of insulin ([place]) 08/16/2020   Preventative health care 08/14/2020   Type 2 diabetes mellitus with hyperglycemia, without long-term current use of insulin ([place]) 04/13/2020   Toe pain, bilateral 04/13/2020   Generalized anxiety disorder 04/18/2019   Diabetic polyneuropathy associated with type 2 diabetes mellitus ([employer]) 04/18/2019   Anemia    Dyslipidemia 12/01/2018   Tachycardia 12/01/2018   HTN (hypertension) 10/01/2018   Fibroids    Fibroid, uterine    Obesity (BMI 30-39.9) 02/21/2013   Hyperlipidemia 03/13/2011   MORBID OBESITY 08/08/2008   Uncontrolled type 2 diabetes mellitus with complication, without long-term current use of insulin 03/31/2008   UNSPECIFIED ANEMIA 03/31/2008    GLYCOSURIA 03/29/2008   LIVER FUNCTION TESTS, ABNORMAL, HX OF 02/28/2007   ABDOMINAL PAIN 02/17/2007   ANXIETY 11/18/2006   GESTATIONAL DIABETES 11/18/2006   FATIGUE 11/18/2006   HEADACHE 11/18/2006   ABDOMINAL PAIN, EPIGASTRIC 11/18/2006    REFERRING DIAG: M25.561 (ICD-10-CM) - Pain in right knee  THERAPY DIAG:  Chronic pain of right knee  Muscle weakness (generalized)  Stiffness of right knee, not elsewhere classified  PERTINENT HISTORY: PCOS, DM, anxiety, depression, Hyperlipidema, uterine fibroid  PRECAUTIONS: none  SUBJECTIVE:  I had the best night of sleep last night that I have had.  I am a 1/10 stiff knee but I am doing better.    PAIN:  Are you having pain? Yes VAS scale: 1/10 better than on eval Pain location: back of knee Pain orientation: Right and Posterior  PAIN TYPE: stiffness Pain description: constant  especially at night Aggravating factors: nothing is aggravating  I just feel stiff Relieving factors: seems like exercise is helping     OBJECTIVE:  all objective findings taken on eval except where otherwise noted/ New info in bold   DIAGNOSTIC FINDINGS:  Knee swelling   R 46 cm, L 39 cm   PATIENT SURVEYS:  FOTO Intake 49 predicted 66   COGNITION:          Overall cognitive status: Within functional limits for tasks  assessed                        SENSATION:          Light touch: Appears intact          Stereognosis: Appears intact          Hot/Cold: Appears intact          Proprioception: Appears intact   MUSCLE LENGTH: Hamstrings: Right 65 deg; Left 80 deg Thomas test: Right neg deg; Left neg deg   POSTURE:  Slightly rounded shoulders.  Slight forward head.  Anterior pelvic tilt wt shift to the Left   PALPATION: Pt used to have anterior pain but no pain to palpation on anterior R knee.  Pt with minimal pain over post knee mostly over muscles as indicated. Pain over R lateral gastroc and R later hamstring.    LE AROM/PROM:   A/PROM  Right 01/15/2021 Left 01/15/2021 Right 01/17/2021 Right 01-24-21   Hip flexion 120 123    Hip extension        Hip abduction        Hip adduction        Hip internal rotation        Hip external rotation        Knee flexion 95 pain in back of knee P115 125/P130 115 A127 P134  Knee extension 5 0  0  Ankle dorsiflexion 9 12    Ankle plantarflexion        Ankle inversion        Ankle eversion         (Blank rows = not tested)   LE MMT:   MMT Right 01/15/2021 Left 01/15/2021  Hip flexion 5/5 5/5  Hip extension 5/5 5/5  Hip abduction 4/5 4+/5  Hip adduction      Hip internal rotation      Hip external rotation      Knee flexion 4/5 pain 5/5  Knee extension 4+/5 5/5  Ankle dorsiflexion      Ankle plantarflexion      Ankle inversion      Ankle eversion       (Blank rows = not tested)   LOWER EXTREMITY SPECIAL TESTS:  Hip special tests: Saralyn Pilar (FABER) test: negative and Thomas test: negative Knee special tests: Anterior drawer test: negative and Posterior drawer test: negative   FUNCTIONAL TESTS:  5 times sit to stand: 20.40 sec  Pt wt bears to left  with R foot advanced to decrease wt bearing.  6 minute walk test: not done today Squat- leans to left in squat at 60 degrees   GAIT: Distance walked: 150 Assistive device utilized: None Level of assistance: Complete Independence Comments: antalgic gait  using knee brace and wt shifting more to the Left decreased wt on R and decreased stance time on R LE       TODAY'S TREATMENT:  [facility] Adult PT Treatment:                                                DATE: 01-24-21 Therapeutic Exercise:  Recumbent bike for 5 minutes at level 2 Gastroc stretch on L and R 30 sec x 3 facing wall Soleus stretch on L and R 30 sec x 3 facing wall Wall sit  10 x 10 sec  hold  Needs GTB around thigh to decrease R lateral knee pain TKE R knee with BTB 3 x 10 Step up on 6 inch step  3 x 10 on R Step down 4 inches with R  knee with some pain in anterior  knee some pain Then did 3 circuits of  Step down 4 inch step with R  Box squat with 15 lb   Heel raises x 20  Supine hamstring and IT band stretch with stretch out strap 30 sec to 1 minute each. Sidelying clamshell with GTB 3 x 10 on R and then L      [facility] Adult PT Treatment:                                                DATE: 01-17-21 Therapeutic Exercise:  Long Sitting Quad Set with Towel Roll Under Heel 10 x 3 reps Recumbent bike for 5 minutes at level 1  Supine Active Straight Leg Raise -  1 x 10 reps on R and then 3 lb with 2 x 10 on R Supine Heel Slides - 3 x 10 reps Seated Long Arc Quad - 3 x 10 reps Supine hamstring and IT band stretch with stretch out strap 30 sec to 1 minute each. Sidelying clamshell with GTB 3 x 10 on R and then L Gastroc stretch on L and R 30 sec x 3 facing wall Soleus stretch on L and R 30 sec x 3 facing wall    Access Code: [email or web address] Added today  Exercises Hooklying Hamstring Stretch with Strap - 2-3 x daily - 7 x weekly - 1 sets - 2-3 reps - 30sec to 1 minute hold Clamshell with Resistance - 1 x daily - 7 x weekly - 3 sets - 10 reps Gastroc Stretch on Wall - 2-3 x daily - 7 x weekly - 1 sets - 3 reps - 30 sec hold Soleus Stretch on Wall - 2-3 x daily - 7 x weekly - 1 sets - 3 reps - 30 sec hold        01-15-21 Exercises R leg only Long Sitting Quad Set with Towel Roll Under Heel 10 x 2 reps Supine Active Straight Leg Raise -  10 Supine Heel Slides - 10 reps Seated Long Arc Quad - 10 reps       PATIENT EDUCATION:  Education details: deleted some supine exercises and added to HEP standing strength Person educated: Patient Education method: Explanation, Demonstration, Tactile cues, Verbal cues, and Handouts Education comprehension: verbalized understanding, returned demonstration, and verbal cues required     HOME EXERCISE PROGRAM: Access Code: [email or web address] URL: [email or web address] Date: 01/15/2021 Prepared by: Voncille Lo      ASSESSMENT:   CLINICAL IMPRESSION:   Pt enters clinic with knee brace and 1/10 stiffness but much improved from eval. Pt AROM of R knee 127 from 95 degrees on eval and  greater ease of movement.  Pt achieved all 3 STG.  Able to stand with even wt through R and L LE upon standing. Pt progressed with HEP adding standing strength and DC supine exercises today and will continue toward completion of   LTG goals Pt reports 1/10 pain at end of session    REHAB POTENTIAL: Excellent   CLINICAL DECISION MAKING: Stable/uncomplicated   EVALUATION COMPLEXITY: Low     GOALS: Goals reviewed  with patient? Yes    SHORT TERM GOALS:   STG Name Target Date Goal status  1 Pt will be independent with intial HEP Baseline: no knowledge 01-24-21 independent 02/05/2021 Achieved   2 Pt will be able to perform sit to stand with even wt shift bil Baseline: Pt wt shifts to left with sit to stand 01-24-21 Pt able to sit to stand with even wt shift 02/05/2021 Achieved  3 Pt will decrease pain with functional activities by 25% Baseline:at worst 9/10 , today 5/10  01-24-21 1/10 02/05/2021 Achieved    LONG TERM GOALS:    LTG Name Target Date Goal status  1 Pt will be independent with advanced HEP as given Baseline: 02/26/2021 INITIAL  2 Pt will be  able to sit  and get up for movement during job for 8 hour shift with 2/10 or less pain Baseline: Pt now unable to sit for longer than 1 hour at a time. 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 Add step ups and adde weight  Try deadlifting for hamstring/ back strength.  Some gym equipment.      Voncille Lo, PT, [place] Certified Exercise Expert for the Aging Adult  01/24/21 3:51 PM Phone: [phone] Fax: [phone]

## 2021-01-29 ENCOUNTER — Ambulatory Visit: Payer: 59

## 2021-01-29 NOTE — Therapy (Incomplete)
OUTPATIENT PHYSICAL THERAPY TREATMENT NOTE   Patient Name: Cheryl Hart MRN: [id number] DOB:13-Jun-1975, 46 y.o., female Today's Date: 01/29/2021  PCP: Ann Held, DO REFERRING PROVIDER: Pedro Earls, MD      Past Medical History:  Diagnosis Date   Anemia    Anxiety    Depression    Diabetes mellitus    Hyperlipidemia    Hypertension    PCOS (polycystic ovarian syndrome)    Uterine fibroid    Vitamin B12 deficiency    Past Surgical History:  Procedure Laterality Date   CESAREAN SECTION     CHOLECYSTECTOMY     g1 p1     LAPAROSCOPIC GELPORT ASSISTED MYOMECTOMY  02/12/2015   Dr Barbie Banner   Patient Active Problem List   Diagnosis Date Noted   Depression 09/21/2020   PCOS (polycystic ovarian syndrome) 09/21/2020   Vitamin B12 deficiency 09/21/2020   Type 2 diabetes mellitus without complication, without long-term current use of insulin ([place]) 08/16/2020   Preventative health care 08/14/2020   Type 2 diabetes mellitus with hyperglycemia, without long-term current use of insulin ([place]) 04/13/2020   Toe pain, bilateral 04/13/2020   Generalized anxiety disorder 04/18/2019   Diabetic polyneuropathy associated with type 2 diabetes mellitus ([place]) 04/18/2019   Anemia    Dyslipidemia 12/01/2018   Tachycardia 12/01/2018   HTN (hypertension) 10/01/2018   Fibroids    Fibroid, uterine    Obesity (BMI 30-39.9) 02/21/2013   Hyperlipidemia 03/13/2011   MORBID OBESITY 08/08/2008   Uncontrolled type 2 diabetes mellitus with complication, without long-term current use of insulin 03/31/2008   UNSPECIFIED ANEMIA 03/31/2008   GLYCOSURIA 03/29/2008   LIVER FUNCTION TESTS, ABNORMAL, HX OF 02/28/2007   ABDOMINAL PAIN 02/17/2007   ANXIETY 11/18/2006   GESTATIONAL DIABETES 11/18/2006   FATIGUE 11/18/2006   HEADACHE 11/18/2006   ABDOMINAL PAIN, EPIGASTRIC 11/18/2006    REFERRING DIAG: M25.561 (ICD-10-CM) - Pain in right knee  THERAPY DIAG:  No diagnosis found.  PERTINENT  HISTORY: PCOS, DM, anxiety, depression, Hyperlipidema, uterine fibroid  PRECAUTIONS: none  SUBJECTIVE:  ***  PAIN:  Are you having pain? *** VAS scale: ***/10  Pain location: back of knee Pain orientation: Right and Posterior  PAIN TYPE: stiffness Pain description: constant  especially at night Aggravating factors: nothing is aggravating  I just feel stiff Relieving factors: seems like exercise is helping     OBJECTIVE:  all objective findings taken on eval except where otherwise noted/ New info in bold   DIAGNOSTIC FINDINGS:  Knee swelling   R 46 cm, L 39 cm   PATIENT SURVEYS:  FOTO Intake 49 predicted 66   COGNITION:          Overall cognitive status: Within functional limits for tasks assessed                        SENSATION:          Light touch: Appears intact          Stereognosis: Appears intact          Hot/Cold: Appears intact          Proprioception: Appears intact   MUSCLE LENGTH: Hamstrings: Right 65 deg; Left 80 deg Thomas test: Right neg deg; Left neg deg   POSTURE:  Slightly rounded shoulders.  Slight forward head.  Anterior pelvic tilt wt shift to the Left   PALPATION: Pt used to have anterior pain but no pain to palpation on anterior R  knee.  Pt with minimal pain over post knee mostly over muscles as indicated. Pain over R lateral gastroc and R later hamstring.    LE AROM/PROM:   A/PROM Right 01/15/2021 Left 01/15/2021 Right 01/17/2021 Right 01-24-21   Hip flexion 120 123    Hip extension        Hip abduction        Hip adduction        Hip internal rotation        Hip external rotation        Knee flexion 95 pain in back of knee P115 125/P130 115 A127 P134  Knee extension 5 0  0  Ankle dorsiflexion 9 12    Ankle plantarflexion        Ankle inversion        Ankle eversion         (Blank rows = not tested)   LE MMT:   MMT Right 01/15/2021 Left 01/15/2021  Hip flexion 5/5 5/5  Hip extension 5/5 5/5  Hip abduction 4/5 4+/5  Hip adduction       Hip internal rotation      Hip external rotation      Knee flexion 4/5 pain 5/5  Knee extension 4+/5 5/5  Ankle dorsiflexion      Ankle plantarflexion      Ankle inversion      Ankle eversion       (Blank rows = not tested)   LOWER EXTREMITY SPECIAL TESTS:  Hip special tests: Saralyn Pilar (FABER) test: negative and Thomas test: negative Knee special tests: Anterior drawer test: negative and Posterior drawer test: negative   FUNCTIONAL TESTS:  5 times sit to stand: 20.40 sec  Pt wt bears to left  with R foot advanced to decrease wt bearing.  6 minute walk test: not done today Squat- leans to left in squat at 60 degrees   GAIT: Distance walked: 150 Assistive device utilized: None Level of assistance: Complete Independence Comments: antalgic gait  using knee brace and wt shifting more to the Left decreased wt on R and decreased stance time on R LE       TODAY'S TREATMENT:   [facility] Adult PT Treatment:                                                DATE: 01/29/2021 Therapeutic Exercise: *** Manual Therapy: *** Neuromuscular re-ed: *** Therapeutic Activity: *** Modalities: *** Self Care: ***   [facility] Adult PT Treatment:                                                DATE: 01-24-21 Therapeutic Exercise:  Recumbent bike for 5 minutes at level 2 Gastroc stretch on L and R 30 sec x 3 facing wall Soleus stretch on L and R 30 sec x 3 facing wall Wall sit  10 x 10 sec hold  Needs GTB around thigh to decrease R lateral knee pain TKE R knee with BTB 3 x 10 Step up on 6 inch step  3 x 10 on R Step down 4 inches with R  knee with some pain in anterior knee some pain Then did 3 circuits of  Step down 4  inch step with R  Box squat with 15 lb   Heel raises x 20  Supine hamstring and IT band stretch with stretch out strap 30 sec to 1 minute each. Sidelying clamshell with GTB 3 x 10 on R and then L      [facility] Adult PT Treatment:                                                 DATE: 01-17-21 Therapeutic Exercise:  Long Sitting Quad Set with Towel Roll Under Heel 10 x 3 reps Recumbent bike for 5 minutes at level 1  Supine Active Straight Leg Raise -  1 x 10 reps on R and then 3 lb with 2 x 10 on R Supine Heel Slides - 3 x 10 reps Seated Long Arc Quad - 3 x 10 reps Supine hamstring and IT band stretch with stretch out strap 30 sec to 1 minute each. Sidelying clamshell with GTB 3 x 10 on R and then L Gastroc stretch on L and R 30 sec x 3 facing wall Soleus stretch on L and R 30 sec x 3 facing wall    Access Code: [email or web address] Added today  Exercises Hooklying Hamstring Stretch with Strap - 2-3 x daily - 7 x weekly - 1 sets - 2-3 reps - 30sec to 1 minute hold Clamshell with Resistance - 1 x daily - 7 x weekly - 3 sets - 10 reps Gastroc Stretch on Wall - 2-3 x daily - 7 x weekly - 1 sets - 3 reps - 30 sec hold Soleus Stretch on Wall - 2-3 x daily - 7 x weekly - 1 sets - 3 reps - 30 sec hold        01-15-21 Exercises R leg only Long Sitting Quad Set with Towel Roll Under Heel 10 x 2 reps Supine Active Straight Leg Raise -  10 Supine Heel Slides - 10 reps Seated Long Arc Quad - 10 reps       PATIENT EDUCATION:  Education details: deleted some supine exercises and added to HEP standing strength Person educated: Patient Education method: Explanation, Demonstration, Tactile cues, Verbal cues, and Handouts Education comprehension: verbalized understanding, returned demonstration, and verbal cues required     HOME EXERCISE PROGRAM: Access Code: [email or web address] URL: [email or web address] Date: 01/15/2021 Prepared by: Voncille Lo      ASSESSMENT:   CLINICAL IMPRESSION:   ***    REHAB POTENTIAL: Excellent   CLINICAL DECISION MAKING: Stable/uncomplicated   EVALUATION COMPLEXITY: Low     GOALS: Goals reviewed with patient? Yes    SHORT TERM GOALS:   STG Name Target Date Goal status  1 Pt will be independent with intial  HEP Baseline: no knowledge 01-24-21 independent 02/05/2021 Achieved   2 Pt will be able to perform sit to stand with even wt shift bil Baseline: Pt wt shifts to left with sit to stand 01-24-21 Pt able to sit to stand with even wt shift 02/05/2021 Achieved  3 Pt will decrease pain with functional activities by 25% Baseline:at worst 9/10 , today 5/10  01-24-21 1/10 02/05/2021 Achieved    LONG TERM GOALS:    LTG Name Target Date Goal status  1 Pt will be independent with advanced HEP as given Baseline: 02/26/2021 INITIAL  2 Pt will be  able to sit  and get up  for movement during job for 8 hour shift with 2/10 or less pain Baseline: Pt now unable to sit for longer than 1 hour at a time. 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 Add step ups and adde weight  Try deadlifting for hamstring/ back strength.  Some gym equipment.      Vanessa Deming, PT,  DPT 01/29/21 8:59 AM

## 2021-01-30 ENCOUNTER — Other Ambulatory Visit (HOSPITAL_COMMUNITY): Payer: Self-pay

## 2021-01-30 MED ORDER — PREDNISOLONE ACETATE 1 % OP SUSP
OPHTHALMIC | 0 refills | Status: DC
  Filled 2021-01-30: qty 5, 25d supply, fill #0

## 2021-01-30 NOTE — Therapy (Incomplete)
OUTPATIENT PHYSICAL THERAPY TREATMENT NOTE   Patient Name: Cheryl Hart MRN: [id number] DOB:1975-06-04, 46 y.o., female Today's Date: 01/30/2021  PCP: Ann Held, DO REFERRING PROVIDER: Pedro Earls, MD      Past Medical History:  Diagnosis Date   Anemia    Anxiety    Depression    Diabetes mellitus    Hyperlipidemia    Hypertension    PCOS (polycystic ovarian syndrome)    Uterine fibroid    Vitamin B12 deficiency    Past Surgical History:  Procedure Laterality Date   CESAREAN SECTION     CHOLECYSTECTOMY     g1 p1     LAPAROSCOPIC GELPORT ASSISTED MYOMECTOMY  02/12/2015   Dr Barbie Banner   Patient Active Problem List   Diagnosis Date Noted   Depression 09/21/2020   PCOS (polycystic ovarian syndrome) 09/21/2020   Vitamin B12 deficiency 09/21/2020   Type 2 diabetes mellitus without complication, without long-term current use of insulin ([place]) 08/16/2020   Preventative health care 08/14/2020   Type 2 diabetes mellitus with hyperglycemia, without long-term current use of insulin ([place]) 04/13/2020   Toe pain, bilateral 04/13/2020   Generalized anxiety disorder 04/18/2019   Diabetic polyneuropathy associated with type 2 diabetes mellitus ([place]) 04/18/2019   Anemia    Dyslipidemia 12/01/2018   Tachycardia 12/01/2018   HTN (hypertension) 10/01/2018   Fibroids    Fibroid, uterine    Obesity (BMI 30-39.9) 02/21/2013   Hyperlipidemia 03/13/2011   MORBID OBESITY 08/08/2008   Uncontrolled type 2 diabetes mellitus with complication, without long-term current use of insulin 03/31/2008   UNSPECIFIED ANEMIA 03/31/2008   GLYCOSURIA 03/29/2008   LIVER FUNCTION TESTS, ABNORMAL, HX OF 02/28/2007   ABDOMINAL PAIN 02/17/2007   ANXIETY 11/18/2006   GESTATIONAL DIABETES 11/18/2006   FATIGUE 11/18/2006   HEADACHE 11/18/2006   ABDOMINAL PAIN, EPIGASTRIC 11/18/2006    REFERRING DIAG: M25.561 (ICD-10-CM) - Pain in right knee  THERAPY DIAG:  No diagnosis found.  PERTINENT  HISTORY: PCOS, DM, anxiety, depression, Hyperlipidema, uterine fibroid  PRECAUTIONS: none  SUBJECTIVE:  ***  PAIN:  Are you having pain? *** VAS scale: ***/10  Pain location: back of knee Pain orientation: Right and Posterior  PAIN TYPE: stiffness Pain description: constant  especially at night Aggravating factors: nothing is aggravating  I just feel stiff Relieving factors: seems like exercise is helping     OBJECTIVE:  all objective findings taken on eval except where otherwise noted/ New info in bold   DIAGNOSTIC FINDINGS:  Knee swelling   R 46 cm, L 39 cm   PATIENT SURVEYS:  FOTO Intake 49 predicted 66   COGNITION:          Overall cognitive status: Within functional limits for tasks assessed                        SENSATION:          Light touch: Appears intact          Stereognosis: Appears intact          Hot/Cold: Appears intact          Proprioception: Appears intact   MUSCLE LENGTH: Hamstrings: Right 65 deg; Left 80 deg Thomas test: Right neg deg; Left neg deg   POSTURE:  Slightly rounded shoulders.  Slight forward head.  Anterior pelvic tilt wt shift to the Left   PALPATION: Pt used to have anterior pain but no pain to palpation on anterior R  knee.  Pt with minimal pain over post knee mostly over muscles as indicated. Pain over R lateral gastroc and R later hamstring.    LE AROM/PROM:   A/PROM Right 01/15/2021 Left 01/15/2021 Right 01/17/2021 Right 01-24-21   Hip flexion 120 123    Hip extension        Hip abduction        Hip adduction        Hip internal rotation        Hip external rotation        Knee flexion 95 pain in back of knee P115 125/P130 115 A127 P134  Knee extension 5 0  0  Ankle dorsiflexion 9 12    Ankle plantarflexion        Ankle inversion        Ankle eversion         (Blank rows = not tested)   LE MMT:   MMT Right 01/15/2021 Left 01/15/2021  Hip flexion 5/5 5/5  Hip extension 5/5 5/5  Hip abduction 4/5 4+/5  Hip adduction       Hip internal rotation      Hip external rotation      Knee flexion 4/5 pain 5/5  Knee extension 4+/5 5/5  Ankle dorsiflexion      Ankle plantarflexion      Ankle inversion      Ankle eversion       (Blank rows = not tested)   LOWER EXTREMITY SPECIAL TESTS:  Hip special tests: Saralyn Pilar (FABER) test: negative and Thomas test: negative Knee special tests: Anterior drawer test: negative and Posterior drawer test: negative   FUNCTIONAL TESTS:  5 times sit to stand: 20.40 sec  Pt wt bears to left  with R foot advanced to decrease wt bearing.  6 minute walk test: not done today Squat- leans to left in squat at 60 degrees   GAIT: Distance walked: 150 Assistive device utilized: None Level of assistance: Complete Independence Comments: antalgic gait  using knee brace and wt shifting more to the Left decreased wt on R and decreased stance time on R LE       TODAY'S TREATMENT:   [facility] Adult PT Treatment:                                                DATE: 01/30/2021 Therapeutic Exercise: *** Manual Therapy: *** Neuromuscular re-ed: *** Therapeutic Activity: *** Modalities: *** Self Care: ***   [facility] Adult PT Treatment:                                                DATE: 01-24-21 Therapeutic Exercise:  Recumbent bike for 5 minutes at level 2 Gastroc stretch on L and R 30 sec x 3 facing wall Soleus stretch on L and R 30 sec x 3 facing wall Wall sit  10 x 10 sec hold  Needs GTB around thigh to decrease R lateral knee pain TKE R knee with BTB 3 x 10 Step up on 6 inch step  3 x 10 on R Step down 4 inches with R  knee with some pain in anterior knee some pain Then did 3 circuits of  Step down 4  inch step with R  Box squat with 15 lb   Heel raises x 20  Supine hamstring and IT band stretch with stretch out strap 30 sec to 1 minute each. Sidelying clamshell with GTB 3 x 10 on R and then L      [facility] Adult PT Treatment:                                                 DATE: 01-17-21 Therapeutic Exercise:  Long Sitting Quad Set with Towel Roll Under Heel 10 x 3 reps Recumbent bike for 5 minutes at level 1  Supine Active Straight Leg Raise -  1 x 10 reps on R and then 3 lb with 2 x 10 on R Supine Heel Slides - 3 x 10 reps Seated Long Arc Quad - 3 x 10 reps Supine hamstring and IT band stretch with stretch out strap 30 sec to 1 minute each. Sidelying clamshell with GTB 3 x 10 on R and then L Gastroc stretch on L and R 30 sec x 3 facing wall Soleus stretch on L and R 30 sec x 3 facing wall    Access Code: [email or web address] Added today  Exercises Hooklying Hamstring Stretch with Strap - 2-3 x daily - 7 x weekly - 1 sets - 2-3 reps - 30sec to 1 minute hold Clamshell with Resistance - 1 x daily - 7 x weekly - 3 sets - 10 reps Gastroc Stretch on Wall - 2-3 x daily - 7 x weekly - 1 sets - 3 reps - 30 sec hold Soleus Stretch on Wall - 2-3 x daily - 7 x weekly - 1 sets - 3 reps - 30 sec hold        01-15-21 Exercises R leg only Long Sitting Quad Set with Towel Roll Under Heel 10 x 2 reps Supine Active Straight Leg Raise -  10 Supine Heel Slides - 10 reps Seated Long Arc Quad - 10 reps       PATIENT EDUCATION:  Education details: deleted some supine exercises and added to HEP standing strength Person educated: Patient Education method: Explanation, Demonstration, Tactile cues, Verbal cues, and Handouts Education comprehension: verbalized understanding, returned demonstration, and verbal cues required     HOME EXERCISE PROGRAM: Access Code: [email or web address] URL: [email or web address] Date: 01/15/2021 Prepared by: Voncille Lo      ASSESSMENT:   CLINICAL IMPRESSION:   ***    REHAB POTENTIAL: Excellent   CLINICAL DECISION MAKING: Stable/uncomplicated   EVALUATION COMPLEXITY: Low     GOALS: Goals reviewed with patient? Yes    SHORT TERM GOALS:   STG Name Target Date Goal status  1 Pt will be independent with intial  HEP Baseline: no knowledge 01-24-21 independent 02/05/2021 Achieved   2 Pt will be able to perform sit to stand with even wt shift bil Baseline: Pt wt shifts to left with sit to stand 01-24-21 Pt able to sit to stand with even wt shift 02/05/2021 Achieved  3 Pt will decrease pain with functional activities by 25% Baseline:at worst 9/10 , today 5/10  01-24-21 1/10 02/05/2021 Achieved    LONG TERM GOALS:    LTG Name Target Date Goal status  1 Pt will be independent with advanced HEP as given Baseline: 02/26/2021 INITIAL  2 Pt will be  able to sit  and get up  for movement during job for 8 hour shift with 2/10 or less pain Baseline: Pt now unable to sit for longer than 1 hour at a time. 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 Add step ups and adde weight  Try deadlifting for hamstring/ back strength.  Some gym equipment.      Vanessa Mars, PT,  DPT 01/30/21 5:30 PM

## 2021-01-31 ENCOUNTER — Ambulatory Visit: Payer: 59

## 2021-01-31 ENCOUNTER — Other Ambulatory Visit: Payer: Self-pay | Admitting: Family Medicine

## 2021-01-31 ENCOUNTER — Other Ambulatory Visit (HOSPITAL_COMMUNITY): Payer: Self-pay

## 2021-01-31 DIAGNOSIS — F411 Generalized anxiety disorder: Secondary | ICD-10-CM

## 2021-01-31 MED ORDER — ALPRAZOLAM 0.25 MG PO TABS
0.2500 mg | ORAL_TABLET | Freq: Three times a day (TID) | ORAL | 1 refills | Status: DC | PRN
  Filled 2021-01-31: qty 30, 10d supply, fill #0
  Filled 2021-02-25: qty 30, 10d supply, fill #1

## 2021-01-31 NOTE — Telephone Encounter (Signed)
Requesting: alprazolam 0.25mg  Contract: 04/18/2019 UDS: 04/18/2019 Last Visit: 08/14/2020 Next Visit: 02/11/2021 Last Refill: 12/05/2020 #30 and 0RF  Please Advise

## 2021-02-02 NOTE — Therapy (Addendum)
OUTPATIENT PHYSICAL THERAPY TREATMENT NOTE/DISCHARGE NOTE  PHYSICAL THERAPY DISCHARGE SUMMARY  Visits from Start of Care: 4  Current functional level related to goals / functional outcomes: Last known as below   Remaining deficits: unknown   Education / Equipment: Initial HEP   Patient agrees to discharge. Patient goals were partially met. Patient is being discharged due to not returning since the last visit.  Pt did not show for 2 appt of PT  Pt POC is expired on 02-26-21  Voncille Lo, PT, [place] Certified Exercise Expert for the Aging Adult  02/28/21 8:32 AM Phone: [phone] Fax: [phone]   Patient Name: Cheryl Hart MRN: [id number] DOB:[birth year], 46 y.o., female Today's Date: 02/05/2021  PCP: Ann Held, DO REFERRING PROVIDER: Ann Held, *   PT End of Session - 02/05/21 1612     Visit Number 4    Number of Visits 12    Date for PT Re-Evaluation 02/26/21    Authorization Type UMR    PT Start Time 1615    PT Stop Time 1700    PT Time Calculation (min) 45 min    Activity Tolerance Patient tolerated treatment well    Behavior During Therapy WFL for tasks assessed/performed               Past Medical History:  Diagnosis Date   Anemia    Anxiety    Depression    Diabetes mellitus    Hyperlipidemia    Hypertension    PCOS (polycystic ovarian syndrome)    Uterine fibroid    Vitamin B12 deficiency    Past Surgical History:  Procedure Laterality Date   CESAREAN SECTION     CHOLECYSTECTOMY     g1 p1     LAPAROSCOPIC GELPORT ASSISTED MYOMECTOMY  02/12/2015   Dr Barbie Banner   Patient Active Problem List   Diagnosis Date Noted   Depression 09/21/2020   PCOS (polycystic ovarian syndrome) 09/21/2020   Vitamin B12 deficiency 09/21/2020   Type 2 diabetes mellitus without complication, without long-term current use of insulin ([place]) 08/16/2020   Preventative health care 08/14/2020   Type 2 diabetes mellitus with hyperglycemia,  without long-term current use of insulin ([place]) 04/13/2020   Toe pain, bilateral 04/13/2020   Generalized anxiety disorder 04/18/2019   Diabetic polyneuropathy associated with type 2 diabetes mellitus ([place]) 04/18/2019   Anemia    Dyslipidemia 12/01/2018   Tachycardia 12/01/2018   HTN (hypertension) 10/01/2018   Fibroids    Fibroid, uterine    Obesity (BMI 30-39.9) 02/21/2013   Hyperlipidemia 03/13/2011   MORBID OBESITY 08/08/2008   Uncontrolled type 2 diabetes mellitus with complication, without long-term current use of insulin 03/31/2008   UNSPECIFIED ANEMIA 03/31/2008   GLYCOSURIA 03/29/2008   LIVER FUNCTION TESTS, ABNORMAL, HX OF 02/28/2007   ABDOMINAL PAIN 02/17/2007   ANXIETY 11/18/2006   GESTATIONAL DIABETES 11/18/2006   FATIGUE 11/18/2006   HEADACHE 11/18/2006   ABDOMINAL PAIN, EPIGASTRIC 11/18/2006    REFERRING DIAG: M25.561 (ICD-10-CM) - Pain in right knee  THERAPY DIAG:  Chronic pain of right knee  Muscle weakness (generalized)  PERTINENT HISTORY: PCOS, DM, anxiety, depression, Hyperlipidema, uterine fibroid  PRECAUTIONS: none  SUBJECTIVE:  Pt reports that her knee pain and swelling had decreased while taking prednisone for an eye condition last week. However, her pain and swelling have begun increasing since stopping taking the prednisone. She adds that she has been doing her HEP daily, only having pain with quadriceps stretching.  PAIN:  Are  you having pain? Yes VAS scale: 3/10  Pain location: back of knee Pain orientation: Right and Posterior  PAIN TYPE: stiffness, tightness Pain description: constant  especially at night Aggravating factors: nothing is aggravating  I just feel stiff Relieving factors: seems like exercise is helping     OBJECTIVE:  all objective findings taken on eval except where otherwise noted/ New info in bold   DIAGNOSTIC FINDINGS:  Knee swelling   R 46 cm, L 39 cm   PATIENT SURVEYS:  FOTO Intake 49 predicted 66    COGNITION:          Overall cognitive status: Within functional limits for tasks assessed                        SENSATION:          Light touch: Appears intact          Stereognosis: Appears intact          Hot/Cold: Appears intact          Proprioception: Appears intact   MUSCLE LENGTH: Hamstrings: Right 65 deg; Left 80 deg Thomas test: Right neg deg; Left neg deg   POSTURE:  Slightly rounded shoulders.  Slight forward head.  Anterior pelvic tilt wt shift to the Left   PALPATION: Pt used to have anterior pain but no pain to palpation on anterior R knee.  Pt with minimal pain over post knee mostly over muscles as indicated. Pain over R lateral gastroc and R later hamstring.    LE AROM/PROM:   A/PROM Right 01/15/2021 Left 01/15/2021 Right 01/17/2021 Right 01-24-21   Hip flexion 120 123    Hip extension        Hip abduction        Hip adduction        Hip internal rotation        Hip external rotation        Knee flexion 95 pain in back of knee P115 125/P130 115 A127 P134  Knee extension 5 0  0  Ankle dorsiflexion 9 12    Ankle plantarflexion        Ankle inversion        Ankle eversion         (Blank rows = not tested)   LE MMT:   MMT Right 01/15/2021 Left 01/15/2021 Right 02/05/2021  Hip flexion 5/5 5/5   Hip extension 5/5 5/5   Hip abduction 4/5 4+/5 4/5  Hip adduction       Hip internal rotation       Hip external rotation       Knee flexion 4/5 pain 5/5 5/5 mild hamstring pain  Knee extension 4+/5 5/5 5/5  Ankle dorsiflexion       Ankle plantarflexion       Ankle inversion       Ankle eversion        (Blank rows = not tested)   LOWER EXTREMITY SPECIAL TESTS:  Hip special tests: Saralyn Pilar (FABER) test: negative and Thomas test: negative Knee special tests: Anterior drawer test: negative and Posterior drawer test: negative   FUNCTIONAL TESTS:  5 times sit to stand: 20.40 sec  Pt wt bears to left  with R foot advanced to decrease wt bearing.  6 minute walk test:  not done today Squat- leans to left in squat at 60 degrees   GAIT: Distance walked: 150 Assistive device utilized: None Level of assistance: Complete  Independence Comments: antalgic gait  using knee brace and wt shifting more to the Left decreased wt on R and decreased stance time on R LE       TODAY'S TREATMENT:   [facility] Adult PT Treatment:                                                DATE: 02/05/2021 Therapeutic Exercise: NuStep x5 minutes with level 7 resistance while collecting subjective information Lateral heel taps on 4-inch step 2x10 BIL Dead lift with 45# barbell plus 30# 3x8 Standing Cybex hip abduction with 25# cable 2x10 BIL Standing Cybex hip extension with 25# cable 2x10 BIL Prone hamstring curl with 4# ankle weights 3x10 with 5-sec lowering Seated double leg knee extension machine with 10# cable 3x10 with 3-sec lowering Manual Therapy: N/A Neuromuscular re-ed: N/A Therapeutic Activity: N/A Modalities: N/A Self Care: N/A   [facility] Adult PT Treatment:                                                DATE: 01-24-21 Therapeutic Exercise:  Recumbent bike for 5 minutes at level 2 Gastroc stretch on L and R 30 sec x 3 facing wall Soleus stretch on L and R 30 sec x 3 facing wall Wall sit  10 x 10 sec hold  Needs GTB around thigh to decrease R lateral knee pain TKE R knee with BTB 3 x 10 Step up on 6 inch step  3 x 10 on R Step down 4 inches with R  knee with some pain in anterior knee some pain Then did 3 circuits of  Step down 4 inch step with R  Box squat with 15 lb   Heel raises x 20  Supine hamstring and IT band stretch with stretch out strap 30 sec to 1 minute each. Sidelying clamshell with GTB 3 x 10 on R and then L      [facility] Adult PT Treatment:                                                DATE: 01-17-21 Therapeutic Exercise:  Long Sitting Quad Set with Towel Roll Under Heel 10 x 3 reps Recumbent bike for 5 minutes at level 1  Supine Active Straight  Leg Raise -  1 x 10 reps on R and then 3 lb with 2 x 10 on R Supine Heel Slides - 3 x 10 reps Seated Long Arc Quad - 3 x 10 reps Supine hamstring and IT band stretch with stretch out strap 30 sec to 1 minute each. Sidelying clamshell with GTB 3 x 10 on R and then L Gastroc stretch on L and R 30 sec x 3 facing wall Soleus stretch on L and R 30 sec x 3 facing wall    Access Code: [email or web address] Added today  Exercises Hooklying Hamstring Stretch with Strap - 2-3 x daily - 7 x weekly - 1 sets - 2-3 reps - 30sec to 1 minute hold Clamshell with Resistance - 1 x daily - 7 x weekly - 3 sets - 10  reps Gastroc Stretch on Wall - 2-3 x daily - 7 x weekly - 1 sets - 3 reps - 30 sec hold Soleus Stretch on Wall - 2-3 x daily - 7 x weekly - 1 sets - 3 reps - 30 sec hold        01-15-21 Exercises R leg only Long Sitting Quad Set with Towel Roll Under Heel 10 x 2 reps Supine Active Straight Leg Raise -  10 Supine Heel Slides - 10 reps Seated Long Arc Quad - 10 reps       PATIENT EDUCATION:  Education details: deleted some supine exercises and added to HEP standing strength Person educated: Patient Education method: Explanation, Demonstration, Tactile cues, Verbal cues, and Handouts Education comprehension: verbalized understanding, returned demonstration, and verbal cues required     HOME EXERCISE PROGRAM: Access Code: [email or web address] URL: [email or web address] Date: 01/15/2021 Prepared by: Voncille Lo      ASSESSMENT:   CLINICAL IMPRESSION:   Pt responded well to all interventions today, demonstrating good form and no increase in pain with selected exercises. She reports moderate fatigue at the end of the session and states she felt like she had a good workout today. Of note, upon re-assessment of LE strength, the pt has made excellent progress in knee flexion and extension MMT. She will continue to benefit from skilled PT to address her primary impairments and return to her  prior level of function with less limitation.     REHAB POTENTIAL: Excellent   CLINICAL DECISION MAKING: Stable/uncomplicated   EVALUATION COMPLEXITY: Low     GOALS: Goals reviewed with patient? Yes    SHORT TERM GOALS:   STG Name Target Date Goal status  1 Pt will be independent with intial HEP Baseline: no knowledge 01-24-21 independent 02/05/2021 Achieved   2 Pt will be able to perform sit to stand with even wt shift bil Baseline: Pt wt shifts to left with sit to stand 01-24-21 Pt able to sit to stand with even wt shift 02/05/2021 Achieved  3 Pt will decrease pain with functional activities by 25% Baseline:at worst 9/10 , today 5/10  01-24-21 1/10 02/05/2021 Achieved    LONG TERM GOALS:    LTG Name Target Date Goal status  1 Pt will be independent with advanced HEP as given Baseline: 02/26/2021 INITIAL  2 Pt will be  able to sit  and get up for movement during job for 8 hour shift with 2/10 or less pain Baseline: Pt now unable to sit for longer than 1 hour at a time. 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

## 2021-02-05 ENCOUNTER — Other Ambulatory Visit: Payer: Self-pay

## 2021-02-05 ENCOUNTER — Ambulatory Visit: Payer: 59

## 2021-02-05 DIAGNOSIS — M25661 Stiffness of right knee, not elsewhere classified: Secondary | ICD-10-CM | POA: Diagnosis not present

## 2021-02-05 DIAGNOSIS — M25561 Pain in right knee: Secondary | ICD-10-CM | POA: Diagnosis not present

## 2021-02-05 DIAGNOSIS — M6281 Muscle weakness (generalized): Secondary | ICD-10-CM | POA: Diagnosis not present

## 2021-02-05 DIAGNOSIS — G8929 Other chronic pain: Secondary | ICD-10-CM | POA: Diagnosis not present

## 2021-02-06 NOTE — Therapy (Incomplete)
OUTPATIENT PHYSICAL THERAPY TREATMENT NOTE   Patient Name: Cheryl Hart MRN: [id number] DOB:01/18/1975, 46 y.o., female Today's Date: 02/06/2021  PCP: Ann Held, DO REFERRING PROVIDER: Pedro Earls, MD       Past Medical History:  Diagnosis Date   Anemia    Anxiety    Depression    Diabetes mellitus    Hyperlipidemia    Hypertension    PCOS (polycystic ovarian syndrome)    Uterine fibroid    Vitamin B12 deficiency    Past Surgical History:  Procedure Laterality Date   CESAREAN SECTION     CHOLECYSTECTOMY     g1 p1     LAPAROSCOPIC GELPORT ASSISTED MYOMECTOMY  02/12/2015   Dr Barbie Banner   Patient Active Problem List   Diagnosis Date Noted   Depression 09/21/2020   PCOS (polycystic ovarian syndrome) 09/21/2020   Vitamin B12 deficiency 09/21/2020   Type 2 diabetes mellitus without complication, without long-term current use of insulin ([place]) 08/16/2020   Preventative health care 08/14/2020   Type 2 diabetes mellitus with hyperglycemia, without long-term current use of insulin ([place]) 04/13/2020   Toe pain, bilateral 04/13/2020   Generalized anxiety disorder 04/18/2019   Diabetic polyneuropathy associated with type 2 diabetes mellitus ([place]) 04/18/2019   Anemia    Dyslipidemia 12/01/2018   Tachycardia 12/01/2018   HTN (hypertension) 10/01/2018   Fibroids    Fibroid, uterine    Obesity (BMI 30-39.9) 02/21/2013   Hyperlipidemia 03/13/2011   MORBID OBESITY 08/08/2008   Uncontrolled type 2 diabetes mellitus with complication, without long-term current use of insulin 03/31/2008   UNSPECIFIED ANEMIA 03/31/2008   GLYCOSURIA 03/29/2008   LIVER FUNCTION TESTS, ABNORMAL, HX OF 02/28/2007   ABDOMINAL PAIN 02/17/2007   ANXIETY 11/18/2006   GESTATIONAL DIABETES 11/18/2006   FATIGUE 11/18/2006   HEADACHE 11/18/2006   ABDOMINAL PAIN, EPIGASTRIC 11/18/2006    REFERRING DIAG: M25.561 (ICD-10-CM) - Pain in right knee  THERAPY DIAG:  No diagnosis  found.  PERTINENT HISTORY: PCOS, DM, anxiety, depression, Hyperlipidema, uterine fibroid  PRECAUTIONS: none  SUBJECTIVE:  ***  PAIN:  Are you having pain? Yes VAS scale: 3/10  Pain location: back of knee Pain orientation: Right and Posterior  PAIN TYPE: stiffness, tightness Pain description: constant  especially at night Aggravating factors: nothing is aggravating  I just feel stiff Relieving factors: seems like exercise is helping     OBJECTIVE:  all objective findings taken on eval except where otherwise noted/ New info in bold   DIAGNOSTIC FINDINGS:  Knee swelling   R 46 cm, L 39 cm   PATIENT SURVEYS:  FOTO Intake 49 predicted 66   COGNITION:          Overall cognitive status: Within functional limits for tasks assessed                        SENSATION:          Light touch: Appears intact          Stereognosis: Appears intact          Hot/Cold: Appears intact          Proprioception: Appears intact   MUSCLE LENGTH: Hamstrings: Right 65 deg; Left 80 deg Thomas test: Right neg deg; Left neg deg   POSTURE:  Slightly rounded shoulders.  Slight forward head.  Anterior pelvic tilt wt shift to the Left   PALPATION: Pt used to have anterior pain but no pain to palpation on  anterior R knee.  Pt with minimal pain over post knee mostly over muscles as indicated. Pain over R lateral gastroc and R later hamstring.    LE AROM/PROM:   A/PROM Right 01/15/2021 Left 01/15/2021 Right 01/17/2021 Right 01-24-21   Hip flexion 120 123    Hip extension        Hip abduction        Hip adduction        Hip internal rotation        Hip external rotation        Knee flexion 95 pain in back of knee P115 125/P130 115 A127 P134  Knee extension 5 0  0  Ankle dorsiflexion 9 12    Ankle plantarflexion        Ankle inversion        Ankle eversion         (Blank rows = not tested)   LE MMT:   MMT Right 01/15/2021 Left 01/15/2021 Right 02/05/2021  Hip flexion 5/5 5/5   Hip extension  5/5 5/5   Hip abduction 4/5 4+/5 4/5  Hip adduction       Hip internal rotation       Hip external rotation       Knee flexion 4/5 pain 5/5 5/5 mild hamstring pain  Knee extension 4+/5 5/5 5/5  Ankle dorsiflexion       Ankle plantarflexion       Ankle inversion       Ankle eversion        (Blank rows = not tested)   LOWER EXTREMITY SPECIAL TESTS:  Hip special tests: Saralyn Pilar (FABER) test: negative and Thomas test: negative Knee special tests: Anterior drawer test: negative and Posterior drawer test: negative   FUNCTIONAL TESTS:  5 times sit to stand: 20.40 sec  Pt wt bears to left  with R foot advanced to decrease wt bearing.  6 minute walk test: not done today Squat- leans to left in squat at 60 degrees   GAIT: Distance walked: 150 Assistive device utilized: None Level of assistance: Complete Independence Comments: antalgic gait  using knee brace and wt shifting more to the Left decreased wt on R and decreased stance time on R LE       TODAY'S TREATMENT:   [facility] Adult PT Treatment:                                                DATE: 02/07/2021 Therapeutic Exercise: *** Manual Therapy: *** Neuromuscular re-ed: *** Therapeutic Activity: *** Modalities: *** Self Care: ***   [facility] Adult PT Treatment:                                                DATE: 02/05/2021 Therapeutic Exercise: NuStep x5 minutes with level 7 resistance while collecting subjective information Lateral heel taps on 4-inch step 2x10 BIL Dead lift with 45# barbell plus 30# 3x8 Standing Cybex hip abduction with 25# cable 2x10 BIL Standing Cybex hip extension with 25# cable 2x10 BIL Prone hamstring curl with 4# ankle weights 3x10 with 5-sec lowering Seated double leg knee extension machine with 10# cable 3x10 with 3-sec lowering Manual Therapy: N/A Neuromuscular re-ed: N/A Therapeutic Activity: N/A  Modalities: N/A Self Care: N/A   [facility] Adult PT Treatment:                                                 DATE: 01-24-21 Therapeutic Exercise:  Recumbent bike for 5 minutes at level 2 Gastroc stretch on L and R 30 sec x 3 facing wall Soleus stretch on L and R 30 sec x 3 facing wall Wall sit  10 x 10 sec hold  Needs GTB around thigh to decrease R lateral knee pain TKE R knee with BTB 3 x 10 Step up on 6 inch step  3 x 10 on R Step down 4 inches with R  knee with some pain in anterior knee some pain Then did 3 circuits of  Step down 4 inch step with R  Box squat with 15 lb   Heel raises x 20  Supine hamstring and IT band stretch with stretch out strap 30 sec to 1 minute each. Sidelying clamshell with GTB 3 x 10 on R and then L     HEP: Access Code: [email or web address]  Exercises Hooklying Hamstring Stretch with Strap - 2-3 x daily - 7 x weekly - 1 sets - 2-3 reps - 30sec to 1 minute hold Clamshell with Resistance - 1 x daily - 7 x weekly - 3 sets - 10 reps Gastroc Stretch on Wall - 2-3 x daily - 7 x weekly - 1 sets - 3 reps - 30 sec hold Soleus Stretch on Wall - 2-3 x daily - 7 x weekly - 1 sets - 3 reps - 30 sec hold         PATIENT EDUCATION:  Education details: deleted some supine exercises and added to HEP standing strength Person educated: Patient Education method: Explanation, Demonstration, Tactile cues, Verbal cues, and Handouts Education comprehension: verbalized understanding, returned demonstration, and verbal cues required     HOME EXERCISE PROGRAM: Access Code: [email or web address] URL: [email or web address] Date: 01/15/2021 Prepared by: Voncille Lo      ASSESSMENT:   CLINICAL IMPRESSION:   ***    REHAB POTENTIAL: Excellent   CLINICAL DECISION MAKING: Stable/uncomplicated   EVALUATION COMPLEXITY: Low     GOALS: Goals reviewed with patient? Yes    SHORT TERM GOALS:   STG Name Target Date Goal status  1 Pt will be independent with intial HEP Baseline: no knowledge 01-24-21 independent 02/05/2021 Achieved   2 Pt will be able to perform  sit to stand with even wt shift bil Baseline: Pt wt shifts to left with sit to stand 01-24-21 Pt able to sit to stand with even wt shift 02/05/2021 Achieved  3 Pt will decrease pain with functional activities by 25% Baseline:at worst 9/10 , today 5/10  01-24-21 1/10 02/05/2021 Achieved    LONG TERM GOALS:    LTG Name Target Date Goal status  1 Pt will be independent with advanced HEP as given Baseline: 02/26/2021 INITIAL  2 Pt will be  able to sit  and get up for movement during job for 8 hour shift with 2/10 or less pain Baseline: Pt now unable to sit for longer than 1 hour at a time. 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

## 2021-02-07 ENCOUNTER — Ambulatory Visit: Payer: 59

## 2021-02-08 ENCOUNTER — Other Ambulatory Visit (HOSPITAL_COMMUNITY): Payer: Self-pay | Admitting: Sports Medicine

## 2021-02-08 DIAGNOSIS — M79604 Pain in right leg: Secondary | ICD-10-CM

## 2021-02-09 NOTE — Therapy (Incomplete)
OUTPATIENT PHYSICAL THERAPY TREATMENT NOTE   Patient Name: Cheryl Hart MRN: [id number] DOB:June 20, 1975, 46 y.o., female Today's Date: 02/09/2021  PCP: Ann Held, DO REFERRING PROVIDER: Pedro Earls, MD       Past Medical History:  Diagnosis Date   Anemia    Anxiety    Depression    Diabetes mellitus    Hyperlipidemia    Hypertension    PCOS (polycystic ovarian syndrome)    Uterine fibroid    Vitamin B12 deficiency    Past Surgical History:  Procedure Laterality Date   CESAREAN SECTION     CHOLECYSTECTOMY     g1 p1     LAPAROSCOPIC GELPORT ASSISTED MYOMECTOMY  02/12/2015   Dr Barbie Banner   Patient Active Problem List   Diagnosis Date Noted   Depression 09/21/2020   PCOS (polycystic ovarian syndrome) 09/21/2020   Vitamin B12 deficiency 09/21/2020   Type 2 diabetes mellitus without complication, without long-term current use of insulin ([place]) 08/16/2020   Preventative health care 08/14/2020   Type 2 diabetes mellitus with hyperglycemia, without long-term current use of insulin ([place]) 04/13/2020   Toe pain, bilateral 04/13/2020   Generalized anxiety disorder 04/18/2019   Diabetic polyneuropathy associated with type 2 diabetes mellitus ([place]) 04/18/2019   Anemia    Dyslipidemia 12/01/2018   Tachycardia 12/01/2018   HTN (hypertension) 10/01/2018   Fibroids    Fibroid, uterine    Obesity (BMI 30-39.9) 02/21/2013   Hyperlipidemia 03/13/2011   MORBID OBESITY 08/08/2008   Uncontrolled type 2 diabetes mellitus with complication, without long-term current use of insulin 03/31/2008   UNSPECIFIED ANEMIA 03/31/2008   GLYCOSURIA 03/29/2008   LIVER FUNCTION TESTS, ABNORMAL, HX OF 02/28/2007   ABDOMINAL PAIN 02/17/2007   ANXIETY 11/18/2006   GESTATIONAL DIABETES 11/18/2006   FATIGUE 11/18/2006   HEADACHE 11/18/2006   ABDOMINAL PAIN, EPIGASTRIC 11/18/2006    REFERRING DIAG: M25.561 (ICD-10-CM) - Pain in right knee  THERAPY DIAG:  No diagnosis  found.  PERTINENT HISTORY: PCOS, DM, anxiety, depression, Hyperlipidema, uterine fibroid  PRECAUTIONS: none  SUBJECTIVE:  ***  PAIN:  Are you having pain? Yes VAS scale: 3/10  Pain location: back of knee Pain orientation: Right and Posterior  PAIN TYPE: stiffness, tightness Pain description: constant  especially at night Aggravating factors: nothing is aggravating  I just feel stiff Relieving factors: seems like exercise is helping     OBJECTIVE:  all objective findings taken on eval except where otherwise noted/ New info in bold   DIAGNOSTIC FINDINGS:  Knee swelling   R 46 cm, L 39 cm   PATIENT SURVEYS:  FOTO Intake 49 predicted 66   COGNITION:          Overall cognitive status: Within functional limits for tasks assessed                        SENSATION:          Light touch: Appears intact          Stereognosis: Appears intact          Hot/Cold: Appears intact          Proprioception: Appears intact   MUSCLE LENGTH: Hamstrings: Right 65 deg; Left 80 deg Thomas test: Right neg deg; Left neg deg   POSTURE:  Slightly rounded shoulders.  Slight forward head.  Anterior pelvic tilt wt shift to the Left   PALPATION: Pt used to have anterior pain but no pain to palpation on  anterior R knee.  Pt with minimal pain over post knee mostly over muscles as indicated. Pain over R lateral gastroc and R later hamstring.    LE AROM/PROM:   A/PROM Right 01/15/2021 Left 01/15/2021 Right 01/17/2021 Right 01-24-21   Hip flexion 120 123    Hip extension        Hip abduction        Hip adduction        Hip internal rotation        Hip external rotation        Knee flexion 95 pain in back of knee P115 125/P130 115 A127 P134  Knee extension 5 0  0  Ankle dorsiflexion 9 12    Ankle plantarflexion        Ankle inversion        Ankle eversion         (Blank rows = not tested)   LE MMT:   MMT Right 01/15/2021 Left 01/15/2021 Right 02/05/2021  Hip flexion 5/5 5/5   Hip extension  5/5 5/5   Hip abduction 4/5 4+/5 4/5  Hip adduction       Hip internal rotation       Hip external rotation       Knee flexion 4/5 pain 5/5 5/5 mild hamstring pain  Knee extension 4+/5 5/5 5/5  Ankle dorsiflexion       Ankle plantarflexion       Ankle inversion       Ankle eversion        (Blank rows = not tested)   LOWER EXTREMITY SPECIAL TESTS:  Hip special tests: Saralyn Pilar (FABER) test: negative and Thomas test: negative Knee special tests: Anterior drawer test: negative and Posterior drawer test: negative   FUNCTIONAL TESTS:  5 times sit to stand: 20.40 sec  Pt wt bears to left  with R foot advanced to decrease wt bearing.  6 minute walk test: not done today Squat- leans to left in squat at 60 degrees   GAIT: Distance walked: 150 Assistive device utilized: None Level of assistance: Complete Independence Comments: antalgic gait  using knee brace and wt shifting more to the Left decreased wt on R and decreased stance time on R LE       TODAY'S TREATMENT:   [facility] Adult PT Treatment:                                                DATE: 02/12/2021 Therapeutic Exercise: *** Manual Therapy: *** Neuromuscular re-ed: *** Therapeutic Activity: *** Modalities: *** Self Care: ***   [facility] Adult PT Treatment:                                                DATE: 02/05/2021 Therapeutic Exercise: NuStep x5 minutes with level 7 resistance while collecting subjective information Lateral heel taps on 4-inch step 2x10 BIL Dead lift with 45# barbell plus 30# 3x8 Standing Cybex hip abduction with 25# cable 2x10 BIL Standing Cybex hip extension with 25# cable 2x10 BIL Prone hamstring curl with 4# ankle weights 3x10 with 5-sec lowering Seated double leg knee extension machine with 10# cable 3x10 with 3-sec lowering Manual Therapy: N/A Neuromuscular re-ed: N/A Therapeutic Activity: N/A  Modalities: N/A Self Care: N/A   [facility] Adult PT Treatment:                                                 DATE: 01-24-21 Therapeutic Exercise:  Recumbent bike for 5 minutes at level 2 Gastroc stretch on L and R 30 sec x 3 facing wall Soleus stretch on L and R 30 sec x 3 facing wall Wall sit  10 x 10 sec hold  Needs GTB around thigh to decrease R lateral knee pain TKE R knee with BTB 3 x 10 Step up on 6 inch step  3 x 10 on R Step down 4 inches with R  knee with some pain in anterior knee some pain Then did 3 circuits of  Step down 4 inch step with R  Box squat with 15 lb   Heel raises x 20  Supine hamstring and IT band stretch with stretch out strap 30 sec to 1 minute each. Sidelying clamshell with GTB 3 x 10 on R and then L     HEP: Access Code: [email or web address]  Exercises Hooklying Hamstring Stretch with Strap - 2-3 x daily - 7 x weekly - 1 sets - 2-3 reps - 30sec to 1 minute hold Clamshell with Resistance - 1 x daily - 7 x weekly - 3 sets - 10 reps Gastroc Stretch on Wall - 2-3 x daily - 7 x weekly - 1 sets - 3 reps - 30 sec hold Soleus Stretch on Wall - 2-3 x daily - 7 x weekly - 1 sets - 3 reps - 30 sec hold         PATIENT EDUCATION:  Education details: deleted some supine exercises and added to HEP standing strength Person educated: Patient Education method: Explanation, Demonstration, Tactile cues, Verbal cues, and Handouts Education comprehension: verbalized understanding, returned demonstration, and verbal cues required     HOME EXERCISE PROGRAM: Access Code: [email or web address] URL: [email or web address] Date: 01/15/2021 Prepared by: Voncille Lo      ASSESSMENT:   CLINICAL IMPRESSION:   ***    REHAB POTENTIAL: Excellent   CLINICAL DECISION MAKING: Stable/uncomplicated   EVALUATION COMPLEXITY: Low     GOALS: Goals reviewed with patient? Yes    SHORT TERM GOALS:   STG Name Target Date Goal status  1 Pt will be independent with intial HEP Baseline: no knowledge 01-24-21 independent 02/05/2021 Achieved   2 Pt will be able to perform  sit to stand with even wt shift bil Baseline: Pt wt shifts to left with sit to stand 01-24-21 Pt able to sit to stand with even wt shift 02/05/2021 Achieved  3 Pt will decrease pain with functional activities by 25% Baseline:at worst 9/10 , today 5/10  01-24-21 1/10 02/05/2021 Achieved    LONG TERM GOALS:    LTG Name Target Date Goal status  1 Pt will be independent with advanced HEP as given Baseline: 02/26/2021 INITIAL  2 Pt will be  able to sit  and get up for movement during job for 8 hour shift with 2/10 or less pain Baseline: Pt now unable to sit for longer than 1 hour at a time. 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

## 2021-02-11 ENCOUNTER — Other Ambulatory Visit: Payer: Self-pay

## 2021-02-11 ENCOUNTER — Ambulatory Visit (HOSPITAL_COMMUNITY)
Admission: RE | Admit: 2021-02-11 | Discharge: 2021-02-11 | Disposition: A | Discharge status: Home / Self Care | Payer: 59 | Source: Ambulatory Visit | Attending: Sports Medicine | Admitting: Sports Medicine

## 2021-02-11 ENCOUNTER — Ambulatory Visit: Payer: 59 | Admitting: Family Medicine

## 2021-02-11 ENCOUNTER — Other Ambulatory Visit (HOSPITAL_COMMUNITY): Payer: Self-pay

## 2021-02-11 DIAGNOSIS — M79604 Pain in right leg: Secondary | ICD-10-CM | POA: Diagnosis not present

## 2021-02-11 DIAGNOSIS — M79605 Pain in left leg: Secondary | ICD-10-CM

## 2021-02-11 NOTE — Progress Notes (Signed)
Lower extremity venous has been completed.   Preliminary results in CV Proc.   Cheryl Hart 02/11/2021 9:05 AM

## 2021-02-12 ENCOUNTER — Ambulatory Visit: Payer: 59

## 2021-02-12 ENCOUNTER — Telehealth: Payer: Self-pay

## 2021-02-12 NOTE — Telephone Encounter (Signed)
Left message for pt regarding her 2nd no show. Detailed the clinic's attendance policy, including that due to her 2nd no show, the pt will only be able to schedule 1 visit at a time moving forward. Confirmed the pt's next remaining appointment and informed pt that all other future appointments have been cancelled in accordance with the attendance policy. Provided the clinic's phone number for the pt to call if she needs to cancel/ reschedule her Thursday appointment.

## 2021-02-13 NOTE — Therapy (Incomplete)
OUTPATIENT PHYSICAL THERAPY TREATMENT NOTE   Patient Name: Cheryl Hart MRN: [id number] DOB:01/22/1975, 46 y.o., female Today's Date: 02/13/2021  PCP: Ann Held, DO REFERRING PROVIDER: Pedro Earls, MD       Past Medical History:  Diagnosis Date   Anemia    Anxiety    Depression    Diabetes mellitus    Hyperlipidemia    Hypertension    PCOS (polycystic ovarian syndrome)    Uterine fibroid    Vitamin B12 deficiency    Past Surgical History:  Procedure Laterality Date   CESAREAN SECTION     CHOLECYSTECTOMY     g1 p1     LAPAROSCOPIC [place]ELPORT ASSISTED MYOMECTOMY  02/12/2015   Dr Barbie Banner   Patient Active Problem List   Diagnosis Date Noted   Depression 09/21/2020   PCOS (polycystic ovarian syndrome) 09/21/2020   Vitamin B12 deficiency 09/21/2020   Type 2 diabetes mellitus without complication, without long-term current use of insulin ([place]) 08/16/2020   Preventative health care 08/14/2020   Type 2 diabetes mellitus with hyperglycemia, without long-term current use of insulin ([place]) 04/13/2020   Toe pain, bilateral 04/13/2020   Generalized anxiety disorder 04/18/2019   Diabetic polyneuropathy associated with type 2 diabetes mellitus ([place]) 04/18/2019   Anemia    Dyslipidemia 12/01/2018   Tachycardia 12/01/2018   HTN (hypertension) 10/01/2018   Fibroids    Fibroid, uterine    Obesity (BMI 30-39.9) 02/21/2013   Hyperlipidemia 03/13/2011   MORBID OBESITY 08/08/2008   Uncontrolled type 2 diabetes mellitus with complication, without long-term current use of insulin 03/31/2008   UNSPECIFIED ANEMIA 03/31/2008   GLYCOSURIA 03/29/2008   LIVER FUNCTION TESTS, ABNORMAL, HX OF 02/28/2007   ABDOMINAL PAIN 02/17/2007   ANXIETY 11/18/2006   GESTATIONAL DIABETES 11/18/2006   FATIGUE 11/18/2006   HEADACHE 11/18/2006   ABDOMINAL PAIN, EPIGASTRIC 11/18/2006    REFERRING DIAG: M25.561 (ICD-10-CM) - Pain in right knee  THERAPY DIAG:  No diagnosis  found.  PERTINENT HISTORY: PCOS, DM, anxiety, depression, Hyperlipidema, uterine fibroid  PRECAUTIONS: none  SUBJECTIVE:  ***  PAIN:  Are you having pain? Yes VAS scale: 3/10  Pain location: back of knee Pain orientation: Right and Posterior  PAIN TYPE: stiffness, tightness Pain description: constant  especially at night Aggravating factors: nothing is aggravating  I just feel stiff Relieving factors: seems like exercise is helping     OBJECTIVE:  all objective findings taken on eval except where otherwise noted/ New info in bold   DIAGNOSTIC FINDINGS:  Knee swelling   R 46 cm, L 39 cm   PATIENT SURVEYS:  FOTO Intake 49 predicted 66   COGNITION:          Overall cognitive status: Within functional limits for tasks assessed                        SENSATION:          Light touch: Appears intact          Stereognosis: Appears intact          Hot/Cold: Appears intact          Proprioception: Appears intact   MUSCLE LENGTH: Hamstrings: Right 65 deg; Left 80 deg Thomas test: Right neg deg; Left neg deg   POSTURE:  Slightly rounded shoulders.  Slight forward head.  Anterior pelvic tilt wt shift to the Left   PALPATION: Pt used to have anterior pain but no pain to palpation on  anterior R knee.  Pt with minimal pain over post knee mostly over muscles as indicated. Pain over R lateral gastroc and R later hamstring.    LE AROM/PROM:   A/PROM Right 01/15/2021 Left 01/15/2021 Right 01/17/2021 Right 01-24-21   Hip flexion 120 123    Hip extension        Hip abduction        Hip adduction        Hip internal rotation        Hip external rotation        Knee flexion 95 pain in back of knee P115 125/P130 115 A127 P134  Knee extension 5 0  0  Ankle dorsiflexion 9 12    Ankle plantarflexion        Ankle inversion        Ankle eversion         (Blank rows = not tested)   LE MMT:   MMT Right 01/15/2021 Left 01/15/2021 Right 02/05/2021  Hip flexion 5/5 5/5   Hip extension  5/5 5/5   Hip abduction 4/5 4+/5 4/5  Hip adduction       Hip internal rotation       Hip external rotation       Knee flexion 4/5 pain 5/5 5/5 mild hamstring pain  Knee extension 4+/5 5/5 5/5  Ankle dorsiflexion       Ankle plantarflexion       Ankle inversion       Ankle eversion        (Blank rows = not tested)   LOWER EXTREMITY SPECIAL TESTS:  Hip special tests: Saralyn Pilar (FABER) test: negative and Thomas test: negative Knee special tests: Anterior drawer test: negative and Posterior drawer test: negative   FUNCTIONAL TESTS:  5 times sit to stand: 20.40 sec  Pt wt bears to left  with R foot advanced to decrease wt bearing.  6 minute walk test: not done today Squat- leans to left in squat at 60 degrees   GAIT: Distance walked: 150 Assistive device utilized: None Level of assistance: Complete Independence Comments: antalgic gait  using knee brace and wt shifting more to the Left decreased wt on R and decreased stance time on R LE       TODAY'S TREATMENT:   [facility] Adult [facility] Treatment:                                                DATE: 02/13/2021 Therapeutic Exercise: *** Manual Therapy: *** Neuromuscular re-ed: *** Therapeutic Activity: *** Modalities: *** Self Care: ***   [facility] Adult PT Treatment:                                                DATE: 02/05/2021 Therapeutic Exercise: NuStep x5 minutes with level 7 resistance while collecting subjective information Lateral heel taps on 4-inch step 2x10 BIL Dead lift with 45# barbell plus 30# 3x8 Standing Cybex hip abduction with 25# cable 2x10 BIL Standing Cybex hip extension with 25# cable 2x10 BIL Prone hamstring curl with 4# ankle weights 3x10 with 5-sec lowering Seated double leg knee extension machine with 10# cable 3x10 with 3-sec lowering Manual Therapy: N/A Neuromuscular re-ed: N/A Therapeutic Activity: N/A  Modalities: N/A Self Care: N/A   [facility] Adult PT Treatment:                                                 DATE: 01-24-21 Therapeutic Exercise:  Recumbent bike for 5 minutes at level 2 Gastroc stretch on L and R 30 sec x 3 facing wall Soleus stretch on L and R 30 sec x 3 facing wall Wall sit  10 x 10 sec hold  Needs GTB around thigh to decrease R lateral knee pain TKE R knee with BTB 3 x 10 Step up on 6 inch step  3 x 10 on R Step down 4 inches with R  knee with some pain in anterior knee some pain Then did 3 circuits of  Step down 4 inch step with R  Box squat with 15 lb   Heel raises x 20  Supine hamstring and IT band stretch with stretch out strap 30 sec to 1 minute each. Sidelying clamshell with GTB 3 x 10 on R and then L     HEP: Access Code: [email or web address]  Exercises Hooklying Hamstring Stretch with Strap - 2-3 x daily - 7 x weekly - 1 sets - 2-3 reps - 30sec to 1 minute hold Clamshell with Resistance - 1 x daily - 7 x weekly - 3 sets - 10 reps Gastroc Stretch on Wall - 2-3 x daily - 7 x weekly - 1 sets - 3 reps - 30 sec hold Soleus Stretch on Wall - 2-3 x daily - 7 x weekly - 1 sets - 3 reps - 30 sec hold         PATIENT EDUCATION:  Education details: deleted some supine exercises and added to HEP standing strength Person educated: Patient Education method: Explanation, Demonstration, Tactile cues, Verbal cues, and Handouts Education comprehension: verbalized understanding, returned demonstration, and verbal cues required     HOME EXERCISE PROGRAM: Access Code: [email or web address] URL: [email or web address] Date: 01/15/2021 Prepared by: Voncille Lo      ASSESSMENT:   CLINICAL IMPRESSION:   ***    REHAB POTENTIAL: Excellent   CLINICAL DECISION MAKING: Stable/uncomplicated   EVALUATION COMPLEXITY: Low     GOALS: Goals reviewed with patient? Yes    SHORT TERM GOALS:   STG Name Target Date Goal status  1 Pt will be independent with intial HEP Baseline: no knowledge 01-24-21 independent 02/05/2021 Achieved   2 Pt will be able to perform  sit to stand with even wt shift bil Baseline: Pt wt shifts to left with sit to stand 01-24-21 Pt able to sit to stand with even wt shift 02/05/2021 Achieved  3 Pt will decrease pain with functional activities by 25% Baseline:at worst 9/10 , today 5/10  01-24-21 1/10 02/05/2021 Achieved    LONG TERM GOALS:    LTG Name Target Date Goal status  1 Pt will be independent with advanced HEP as given Baseline: 02/26/2021 INITIAL  2 Pt will be  able to sit  and get up for movement during job for 8 hour shift with 2/10 or less pain Baseline: Pt now unable to sit for longer than 1 hour at a time. 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

## 2021-02-14 ENCOUNTER — Ambulatory Visit: Payer: 59

## 2021-02-21 ENCOUNTER — Ambulatory Visit: Payer: 59

## 2021-02-21 ENCOUNTER — Ambulatory Visit: Payer: 59 | Admitting: Internal Medicine

## 2021-02-21 NOTE — Progress Notes (Deleted)
Name: Cheryl Hart  Age/ Sex: 46 y.o., female   MRN/ DOB: [id number], [birth year]     PCP: Carollee Herter, Alferd Apa, DO   Reason for Endocrinology Evaluation: Type 2 Diabetes Mellitus  Initial Endocrine Consultative Visit: 12/01/2018    PATIENT IDENTIFIER: Cheryl Hart is a 46 y.o. female with a past medical history of HTN, T2DM, and dyslipidemia  . The patient has followed with Endocrinology clinic since 12/01/2018 for consultative assistance with management of her diabetes.  DIABETIC HISTORY:  Cheryl Hart was diagnosed with DM in 2010. She has tried invokana but didn't;t feel good on it , has been unable to afford Januvia or victoza in the past.  Her hemoglobin A1c has ranged from 7.4% in 2018, peaking at 11.1% in 2019.   On her initial visit to our clinic she had an A1c of 9.0%  , she was on Glimepiride and metformin . 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 We continued Glipizide, Metformin and Trulicity .  Today (02/21/2021): Cheryl Hart is here for a f/u on diabetes.  She  Has been checking 4 times a week. The patient has not had hypoglycemic episodes since the last clinic visit.    Denies nausea or diarrhea  She is not compliant with rosuvastatin, she self reduced it to  three times a week but has started OTC fish oil    HOME DIABETES REGIMEN:  Glipizide 5 mg, 1.5 tablets BID Metformin 850 mg Twice daily  Trulicity 1.5 mg weekly (Monday)     Statin: Yes ACE-I/ARB: yes    METER DOWNLOAD SUMMARY: Did not bring    DIABETIC COMPLICATIONS: Microvascular complications:    Denies: CKD, retinopathy , neuropathy  Last eye exam: Completed 03/2020   Macrovascular complications:    Denies: CAD, PVD, CVA   HISTORY:  Past Medical History:   Past Medical History:  Diagnosis Date   Anemia    Anxiety    Depression    Diabetes mellitus    Hyperlipidemia    Hypertension    PCOS (polycystic ovarian syndrome)    Uterine fibroid    Vitamin B12 deficiency    Past Surgical History:  Past Surgical History:  Procedure Laterality Date   CESAREAN SECTION     CHOLECYSTECTOMY     g1 p1     LAPAROSCOPIC GELPORT ASSISTED MYOMECTOMY  02/12/2015   Dr Barbie Banner   Social History:  reports that she has never smoked. She has never used smokeless tobacco. She reports that she does not drink alcohol and does not use drugs. Family History:  Family History  Problem Relation Age of Onset   Migraines Other    Diabetes Mother    Hypertension Mother    Hyperlipidemia Mother    Kidney disease Mother    Thyroid disease Mother    Anxiety disorder Mother    Diabetes Father    Hypertension Father    Anxiety disorder Father    Liver disease Father    Alcoholism Father    Drug abuse Father    Depression Father    Diabetes Brother      HOME MEDICATIONS: Allergies as of 02/21/2021       Reactions   Sulfa Antibiotics Other (See Comments), Rash  Sulfonamide Derivatives Photosensitivity, Rash, Other (See Comments)   Severe headache        Medication List        Accurate as of February 21, 2021  7:09 AM. If you have any questions, ask your nurse or doctor.          ALPRAZolam 0.25 MG tablet Commonly known as: XANAX Take 1 tablet (0.25 mg total) by mouth 3 (three) times daily as needed.   Biotin 5000 MCG Tabs Take 5,000 mcg by mouth daily.   Blood Glucose Monitor System w/Device Kit use as drected to check blood sugar   diphenhydrAMINE 25 MG tablet Commonly known as: BENADRYL Take 50 mg by mouth at bedtime as needed for sleep.   DULoxetine 60 MG capsule Commonly known as: CYMBALTA Take 1 capsule (60 mg total) by mouth daily.   erythromycin ophthalmic ointment APPLY 1 INCH APPLICATION  INTO LOWER EYELIDS OF THE LEFT EYE  BEFORE BEDTIME   FREESTYLE LITE test strip Generic drug: glucose blood Use as instructed to check blood sugar 2 times daily   glipiZIDE 5 MG tablet Commonly known as: GLUCOTROL Take 1.5 tablets (7.5 mg total) by mouth 2 (two) times daily before a meal.   ibuprofen 600 MG tablet Commonly known as: ADVIL Take 1 tablet (600 mg total) by mouth every 6 (six) hours as needed.   losartan 25 MG tablet Commonly known as: COZAAR Take 1 tablet (25 mg total) by mouth daily.   metFORMIN 850 MG tablet Commonly known as: GLUCOPHAGE Take 1 tablet (850 mg total) by mouth 2 (two) times daily with a meal.   methylPREDNISolone 4 MG Tbpk tablet Commonly known as: MEDROL DOSEPAK Take as directed.   multivitamin with minerals Tabs tablet Take 1 tablet by mouth daily.   naproxen 500 MG tablet Commonly known as: NAPROSYN Take 1 tablet (500 mg total) by mouth 2 (two) times daily as needed.   nystatin powder Commonly known as: nystatin Apply 1 application topically 3 (three) times daily.   prednisoLONE acetate 1 % ophthalmic suspension Commonly known as: PRED FORTE Instill 1 drop in left eye four times a day. SHAKE WELL   rosuvastatin 20 MG tablet Commonly known as: CRESTOR Take 1 tablet (20 mg total) by mouth daily.   Trulicity 1.5 EX/9.3ZJ Sopn Generic drug: Dulaglutide Inject 1.5 mg into the skin once a week.         OBJECTIVE:   Vital Signs: LMP 05/22/2019   Wt Readings from Last 3 Encounters:  08/16/20 199 lb 9.6 oz (90.5 kg)  08/14/20 200 lb 3.2 oz (90.8 kg)  04/13/20 205 lb (93 kg)     Exam: General: Pt appears well and is in NAD  Lungs: Clear with good BS bilat with no rales, rhonchi, or wheezes  Heart: RRR with normal S1 and S2 and no gallops; no murmurs; no rub  Abdomen: Normoactive bowel sounds, soft, nontender, without masses or organomegaly palpable  Extremities: No pretibial edema.   Neuro: MS is good with appropriate affect, pt is alert and Ox3    DM foot  exam:04/13/2020   The skin of the feet is without sores or ulcerations, Right great toe nail is thickened and curved . Left great toe nail is discolored and brittle.  The pedal pulses are 2+ on right and 2+ on left. The sensation is intact to a screening 5.07, 10 gram monofilament bilaterally    DATA REVIEWED:  Lab Results  Component Value Date   HGBA1C 6.5  08/14/2020   HGBA1C 7.1 (A) 04/13/2020   HGBA1C 6.8 (A) 07/19/2019   Results for Cheryl Hart, Cheryl Hart (MRN [id number]) as of 08/16/2020 07:02  Ref. Range 08/14/2020 13:45  Sodium Latest Ref Range: 135 - 145 mEq/L 137  Potassium Latest Ref Range: 3.5 - 5.1 mEq/L 4.2  Chloride Latest Ref Range: 96 - 112 mEq/L 103  CO2 Latest Ref Range: 19 - 32 mEq/L 24  Glucose Latest Ref Range: 70 - 99 mg/dL 150 (H)  BUN Latest Ref Range: 6 - 23 mg/dL 6  Creatinine Latest Ref Range: 0.40 - 1.20 mg/dL 0.62  Calcium Latest Ref Range: 8.4 - 10.5 mg/dL 9.8  Alkaline Phosphatase Latest Ref Range: 39 - 117 U/L 106  Albumin Latest Ref Range: 3.5 - 5.2 g/dL 4.0  AST Latest Ref Range: 0 - 37 U/L 13  ALT Latest Ref Range: 0 - 35 U/L 14  Total Protein Latest Ref Range: 6.0 - 8.3 g/dL 7.4  Total Bilirubin Latest Ref Range: 0.2 - 1.2 mg/dL 0.2  GFR Latest Ref Range: >60.00 mL/min 106.15  Total CHOL/HDL Ratio Unknown 4  Cholesterol Latest Ref Range: 0 - 200 mg/dL 188  HDL Cholesterol Latest Ref Range: >39.00 mg/dL 44.70  LDL (calc) Latest Ref Range: 0 - 99 mg/dL 124 (H)  MICROALB/CREAT RATIO Latest Ref Range: 0.0 - 30.0 mg/g 1.0  NonHDL Unknown 143.43  Triglycerides Latest Ref Range: 0.0 - 149.0 mg/dL 95.0  VLDL Latest Ref Range: 0.0 - 40.0 mg/dL 19.0  WBC Latest Ref Range: 4.0 - 10.5 K/uL 5.6  RBC Latest Ref Range: 3.87 - 5.11 Mil/uL 4.11  Hemoglobin Latest Ref Range: 12.0 - 15.0 g/dL 10.0 (L)  HCT Latest Ref Range: 36.0 - 46.0 % 31.3 (L)  MCV Latest Ref Range: 78.0 - 100.0 fl 76.2 (L)  MCHC Latest Ref Range: 30.0 - 36.0 g/dL 31.9  RDW Latest Ref Range: 11.5 -  15.5 % 16.9 (H)  Platelets Latest Ref Range: 150.0 - 400.0 K/uL 382.0  Neutrophils Latest Ref Range: 43.0 - 77.0 % 58.4  Lymphocytes Latest Ref Range: 12.0 - 46.0 % 32.8  Monocytes Relative Latest Ref Range: 3.0 - 12.0 % 5.2  Eosinophil Latest Ref Range: 0.0 - 5.0 % 2.8  Basophil Latest Ref Range: 0.0 - 3.0 % 0.8  NEUT# Latest Ref Range: 1.4 - 7.7 K/uL 3.3  Lymphocyte # Latest Ref Range: 0.7 - 4.0 K/uL 1.8  Monocyte # Latest Ref Range: 0.1 - 1.0 K/uL 0.3  Eosinophils Absolute Latest Ref Range: 0.0 - 0.7 K/uL 0.2  Basophils Absolute Latest Ref Range: 0.0 - 0.1 K/uL 0.0  Hemoglobin A1C Latest Ref Range: 4.6 - 6.5 % 6.5  TSH Latest Ref Range: 0.35 - 5.50 uIU/mL 1.36  Results for Cheryl Hart, Cheryl Hart (MRN [id number]) as of 08/16/2020 07:02  Ref. Range 08/14/2020 13:45  Creatinine,U Latest Units: mg/dL 239.8  Microalb, Ur Latest Ref Range: 0.0 - 1.9 mg/dL 2.3 (H)  MICROALB/CREAT RATIO Latest Ref Range: 0.0 - 30.0 mg/g 1.0    ASSESSMENT / PLAN / RECOMMENDATIONS:    1) Type 2 Diabetes Mellitus, Optimally controlled,  Without complications - Most recent A1c of 6.5 %. Goal A1c < 7.0 %.    - I have praised the pt on weight loss and optimizing glucose control  - I have encouraged her to continue checking glucose a few times a week   MEDICATIONS: - Continue Glipizide 85m, one and a  Half tablet before Breakfast and one and a half a tablet before Supper - Continue  Metformin 850 mg Twice daily  - Continue  Trulicity 1.5 mg weekly    EDUCATION / INSTRUCTIONS: BG monitoring instructions: Patient is instructed to check her blood sugars 3 times a week Call [facility] if: BG persistently < 70  I reviewed the Rule of 15 for the treatment of hypoglycemia in detail with the patient. Literature supplied.   2) Diabetic complications:  Eye: Does not have known diabetic retinopathy. Pt urged to have an updated eye exam  Neuro/ Feet: Does not have known diabetic peripheral neuropathy. Renal:  Patient does not have known baseline CKD. She is on an ACEI/ARB at present.   3) Dyslipidemia :   - Her LDL has increased to 124 mg/dL, we discussed this is above goal. She has self reduced Rosuvastatin to 3x a week because she started OTC fish oil. We discussed the cardiovascular benefits of statins , she was advised she can take statins and fish oil together.   - Will refill    Medication    Rosuvastatin 20 mg daily    F/U in 6 months    Signed electronically by: Mack Guise, MD  [facility] End[facility]  [address] [place], [place] [address] Phone: [phone] FAX: [phone]   CC: Ann Held, DO [address] [place] [address] Phone: [phone]  Fax: [phone]  Return to Endocrinology clinic as below: Future Appointments  Date Time Provider [place]  02/21/2021  7:30 AM Devyn Griffing, Melanie Crazier, MD [facility] None  03/18/2021 10:20 AM Ann Held, DO [facility]

## 2021-02-26 ENCOUNTER — Other Ambulatory Visit (HOSPITAL_COMMUNITY): Payer: Self-pay

## 2021-02-28 ENCOUNTER — Ambulatory Visit: Payer: 59

## 2021-03-07 ENCOUNTER — Ambulatory Visit: Payer: 59

## 2021-03-18 ENCOUNTER — Ambulatory Visit: Payer: 59 | Admitting: Family Medicine

## 2021-03-22 ENCOUNTER — Ambulatory Visit: Payer: 59 | Admitting: Family Medicine

## 2021-03-26 DIAGNOSIS — M25561 Pain in right knee: Secondary | ICD-10-CM | POA: Diagnosis not present

## 2021-03-26 DIAGNOSIS — M1711 Unilateral primary osteoarthritis, right knee: Secondary | ICD-10-CM | POA: Diagnosis not present

## 2021-03-31 ENCOUNTER — Other Ambulatory Visit: Payer: Self-pay | Admitting: Family Medicine

## 2021-03-31 DIAGNOSIS — I10 Essential (primary) hypertension: Secondary | ICD-10-CM

## 2021-03-31 DIAGNOSIS — F411 Generalized anxiety disorder: Secondary | ICD-10-CM

## 2021-04-01 ENCOUNTER — Other Ambulatory Visit (HOSPITAL_COMMUNITY): Payer: Self-pay

## 2021-04-01 MED ORDER — LOSARTAN POTASSIUM 25 MG PO TABS
25.0000 mg | ORAL_TABLET | Freq: Every day | ORAL | 1 refills | Status: DC
  Filled 2021-04-01: qty 90, 90d supply, fill #0
  Filled 2021-06-24: qty 90, 90d supply, fill #1

## 2021-04-01 MED ORDER — ALPRAZOLAM 0.25 MG PO TABS
0.2500 mg | ORAL_TABLET | Freq: Three times a day (TID) | ORAL | 1 refills | Status: DC | PRN
  Filled 2021-04-01: qty 20, 6d supply, fill #0
  Filled 2021-04-01: qty 10, 4d supply, fill #0
  Filled 2021-04-24: qty 30, 10d supply, fill #1

## 2021-04-01 NOTE — Telephone Encounter (Signed)
Requesting:Xanax ?Contract:04/18/19 ?UDS:04/18/19 ?Last Visit:na ?Next Visit:04/08/21 ?Last Refill:01/31/21 ? ?Please Advise  ?

## 2021-04-08 ENCOUNTER — Ambulatory Visit: Payer: 59 | Admitting: Family Medicine

## 2021-04-12 ENCOUNTER — Other Ambulatory Visit (HOSPITAL_COMMUNITY): Payer: Self-pay

## 2021-04-12 ENCOUNTER — Other Ambulatory Visit: Payer: Self-pay | Admitting: Family Medicine

## 2021-04-12 DIAGNOSIS — E1142 Type 2 diabetes mellitus with diabetic polyneuropathy: Secondary | ICD-10-CM

## 2021-04-12 DIAGNOSIS — F339 Major depressive disorder, recurrent, unspecified: Secondary | ICD-10-CM

## 2021-04-12 MED ORDER — DULOXETINE HCL 60 MG PO CPEP
60.0000 mg | ORAL_CAPSULE | Freq: Every day | ORAL | 0 refills | Status: DC
  Filled 2021-04-12: qty 90, 90d supply, fill #0

## 2021-04-24 ENCOUNTER — Other Ambulatory Visit (HOSPITAL_COMMUNITY): Payer: Self-pay

## 2021-05-09 ENCOUNTER — Ambulatory Visit: Payer: 59 | Admitting: Family Medicine

## 2021-05-13 ENCOUNTER — Encounter: Payer: Self-pay | Admitting: Family Medicine

## 2021-05-13 ENCOUNTER — Ambulatory Visit: Payer: 59 | Admitting: Family Medicine

## 2021-05-13 ENCOUNTER — Other Ambulatory Visit (HOSPITAL_COMMUNITY): Payer: Self-pay

## 2021-05-13 VITALS — BP 124/80 | HR 120 | Temp 97.9°F | Resp 18 | Ht 66.0 in | Wt 190.8 lb

## 2021-05-13 DIAGNOSIS — E1165 Type 2 diabetes mellitus with hyperglycemia: Secondary | ICD-10-CM

## 2021-05-13 DIAGNOSIS — Z79899 Other long term (current) drug therapy: Secondary | ICD-10-CM | POA: Diagnosis not present

## 2021-05-13 DIAGNOSIS — Z1211 Encounter for screening for malignant neoplasm of colon: Secondary | ICD-10-CM | POA: Diagnosis not present

## 2021-05-13 DIAGNOSIS — F411 Generalized anxiety disorder: Secondary | ICD-10-CM

## 2021-05-13 DIAGNOSIS — E119 Type 2 diabetes mellitus without complications: Secondary | ICD-10-CM | POA: Diagnosis not present

## 2021-05-13 DIAGNOSIS — E785 Hyperlipidemia, unspecified: Secondary | ICD-10-CM | POA: Diagnosis not present

## 2021-05-13 MED ORDER — ALPRAZOLAM 0.25 MG PO TABS
0.2500 mg | ORAL_TABLET | Freq: Three times a day (TID) | ORAL | 1 refills | Status: DC | PRN
  Filled 2021-05-13: qty 30, 10d supply, fill #0
  Filled 2021-05-21: qty 30, 10d supply, fill #1

## 2021-05-13 MED ORDER — METFORMIN HCL 850 MG PO TABS
850.0000 mg | ORAL_TABLET | Freq: Two times a day (BID) | ORAL | 3 refills | Status: DC
  Filled 2021-05-13: qty 171, 85d supply, fill #0
  Filled 2021-05-13: qty 9, 5d supply, fill #0
  Filled 2021-08-19: qty 180, 90d supply, fill #1
  Filled 2021-11-19: qty 60, 30d supply, fill #2
  Filled 2021-12-20: qty 60, 30d supply, fill #3
  Filled 2022-01-16: qty 60, 30d supply, fill #4
  Filled 2022-02-17: qty 60, 30d supply, fill #5
  Filled 2022-03-20: qty 60, 30d supply, fill #6
  Filled 2022-04-28: qty 60, 30d supply, fill #7

## 2021-05-13 NOTE — Progress Notes (Addendum)
? ?Subjective:  ? ?By signing my name below, I, Cheryl Hart, attest that this documentation has been prepared under the direction and in the presence of Cheryl Schanz DO, 05/13/2021  ? ? Patient ID: Cheryl Hart, female    DOB: 13-Feb-1975, 46 y.o.   MRN: [id number] ? ?Chief Complaint  ?Patient presents with  ? Anxiety  ? ? ?HPI ?Patient is in today for an office visit. ? ?She is requesting a refill of 0.25 MG of Xanax and 850 MG of Metformin.  ? ?As of today's visit, her blood pressure is good. Her heart rate is elevating. ?BP Readings from Last 3 Encounters:  ?05/13/21 124/80  ?08/16/20 120/80  ?08/14/20 122/82  ? ?Pulse Readings from Last 3 Encounters:  ?05/13/21 (!) 120  ?08/16/20 97  ?08/14/20 (!) 11  ? ?She has not been eating due to a death within the family. She states that her weight is decreasing. She is currently looking into grief counseling. ?Wt Readings from Last 3 Encounters:  ?05/13/21 190 lb 12.8 oz (86.5 kg)  ?08/16/20 199 lb 9.6 oz (90.5 kg)  ?08/14/20 200 lb 3.2 oz (90.8 kg)  ? ?She has not received physical therapy yet for her right knee due to the recent circumstances.  ? ? ?Past Medical History:  ?Diagnosis Date  ? Anemia   ? Anxiety   ? Depression   ? Diabetes mellitus   ? Hyperlipidemia   ? Hypertension   ? PCOS (polycystic ovarian syndrome)   ? Uterine fibroid   ? Vitamin B12 deficiency   ? ? ?Past Surgical History:  ?Procedure Laterality Date  ? CESAREAN SECTION    ? CHOLECYSTECTOMY    ? g1 p1    ? LAPAROSCOPIC GELPORT ASSISTED MYOMECTOMY  02/12/2015  ? Dr Barbie Banner  ? ? ?Family History  ?Problem Relation Age of Onset  ? Migraines Other   ? Diabetes Mother   ? Hypertension Mother   ? Hyperlipidemia Mother   ? Kidney disease Mother   ? Thyroid disease Mother   ? Anxiety disorder Mother   ? Diabetes Father   ? Hypertension Father   ? Anxiety disorder Father   ? Liver disease Father   ? Alcoholism Father   ? Drug abuse Father   ? Depression Father   ? Diabetes Brother   ? ? ?Social History   ? ?Socioeconomic History  ? Marital status: Single  ?  Spouse name: Not on file  ? Number of children: Not on file  ? Years of education: Not on file  ? Highest education level: Not on file  ?Occupational History  ? Occupation: Engineer, building services  ?Tobacco Use  ? Smoking status: Never  ? Smokeless tobacco: Never  ?Vaping Use  ? Vaping Use: Never used  ?Substance and Sexual Activity  ? Alcohol use: No  ? Drug use: No  ? Sexual activity: Yes  ?  Partners: Male  ?  Birth control/protection: Pill  ?Other Topics Concern  ? Not on file  ?Social History Narrative  ? Exercise-- no  ? ?Social Determinants of Health  ? ?Financial Resource Strain: Not on file  ?Food Insecurity: Not on file  ?Transportation Needs: Not on file  ?Physical Activity: Not on file  ?Stress: Not on file  ?Social Connections: Not on file  ?Intimate Partner Violence: Not on file  ? ? ?Outpatient Medications Prior to Visit  ?Medication Sig Dispense Refill  ? Biotin 5000 MCG TABS Take 5,000 mcg by mouth daily.     ?  Blood Glucose Monitoring Suppl (BLOOD GLUCOSE MONITOR SYSTEM) w/Device KIT use as drected to check blood sugar 1 kit 0  ? diphenhydrAMINE (BENADRYL) 25 MG tablet Take 50 mg by mouth at bedtime as needed for sleep.     ? Dulaglutide (TRULICITY) 1.5 MI/6.8EH SOPN Inject 1.5 mg into the skin once a week. 6 mL 3  ? DULoxetine (CYMBALTA) 60 MG capsule Take 1 capsule (60 mg total) by mouth daily. 90 capsule 0  ? glipiZIDE (GLUCOTROL) 5 MG tablet Take 1.5 tablets (7.5 mg total) by mouth 2 (two) times daily before a meal. 270 tablet 3  ? glucose blood (FREESTYLE LITE) test strip Use as instructed to check blood sugar 2 times daily 100 each 5  ? ibuprofen (ADVIL) 600 MG tablet Take 1 tablet (600 mg total) by mouth every 6 (six) hours as needed. 30 tablet 2  ? losartan (COZAAR) 25 MG tablet Take 1 tablet (25 mg total) by mouth daily. 90 tablet 1  ? Multiple Vitamin (MULTIVITAMIN WITH MINERALS) TABS tablet Take 1 tablet by mouth daily.    ? naproxen (NAPROSYN)  500 MG tablet Take 1 tablet (500 mg total) by mouth 2 (two) times daily as needed. 60 tablet 1  ? nystatin powder Apply 1 application topically 3 (three) times daily. 60 g 3  ? rosuvastatin (CRESTOR) 20 MG tablet Take 1 tablet (20 mg total) by mouth daily. 90 tablet 3  ? ALPRAZolam (XANAX) 0.25 MG tablet Take 1 tablet (0.25 mg total) by mouth 3 (three) times daily as needed. 30 tablet 1  ? metFORMIN (GLUCOPHAGE) 850 MG tablet Take 1 tablet (850 mg total) by mouth 2 (two) times daily with a meal. 180 tablet 3  ? erythromycin ophthalmic ointment APPLY 1 INCH APPLICATION  INTO LOWER EYELIDS OF THE LEFT EYE BEFORE BEDTIME (Patient not taking: Reported on 05/13/2021) 3.5 g 0  ? methylPREDNISolone (MEDROL DOSEPAK) 4 MG TBPK tablet Take as directed. (Patient not taking: Reported on 05/13/2021) 21 tablet 0  ? prednisoLONE acetate (PRED FORTE) 1 % ophthalmic suspension Instill 1 drop in left eye four times a day. SHAKE WELL (Patient not taking: Reported on 05/13/2021) 5 mL 0  ? ?No facility-administered medications prior to visit.  ? ? ?Allergies  ?Allergen Reactions  ? Sulfa Antibiotics Other (See Comments) and Rash  ? Sulfonamide Derivatives Photosensitivity, Rash and Other (See Comments)  ?  Severe headache  ? ? ?Review of Systems  ?Constitutional:  Negative for fever and malaise/fatigue.  ?HENT:  Negative for congestion.   ?Eyes:  Negative for blurred vision.  ?Respiratory:  Negative for shortness of breath.   ?Cardiovascular:  Negative for chest pain, palpitations and leg swelling.  ?Gastrointestinal:  Negative for abdominal pain, blood in stool and nausea.  ?Genitourinary:  Negative for dysuria and frequency.  ?Musculoskeletal:  Negative for falls.  ?Skin:  Negative for rash.  ?Neurological:  Negative for dizziness, loss of consciousness and headaches.  ?Endo/Heme/Allergies:  Negative for environmental allergies.  ?Psychiatric/Behavioral:  Negative for depression. The patient is not nervous/anxious.   ? ?   ?Objective:  ?   ?Physical Exam ?Vitals and nursing note reviewed.  ?Constitutional:   ?   General: She is not in acute distress. ?   Appearance: Normal appearance. She is well-developed. She is not ill-appearing.  ?HENT:  ?   Head: Normocephalic and atraumatic.  ?   Right Ear: External ear normal.  ?   Left Ear: External ear normal.  ?Eyes:  ?  Extraocular Movements: Extraocular movements intact.  ?   Conjunctiva/sclera: Conjunctivae normal.  ?   Pupils: Pupils are equal, round, and reactive to light.  ?Neck:  ?   Thyroid: No thyromegaly.  ?   Vascular: No carotid bruit or JVD.  ?Cardiovascular:  ?   Rate and Rhythm: Normal rate and regular rhythm.  ?   Heart sounds: Normal heart sounds. No murmur heard. ?  No gallop.  ?Pulmonary:  ?   Effort: Pulmonary effort is normal. No respiratory distress.  ?   Breath sounds: Normal breath sounds. No wheezing or rales.  ?Chest:  ?   Chest wall: No tenderness.  ?Musculoskeletal:  ?   Cervical back: Normal range of motion and neck supple.  ?Skin: ?   General: Skin is warm and dry.  ?Neurological:  ?   Mental Status: She is alert and oriented to person, place, and time.  ?Psychiatric:     ?   Judgment: Judgment normal.  ?   Comments: Pt has been struggling to sleep since her mothers death   ? ? ?BP 124/80 (BP Location: Left Arm, Patient Position: Sitting, Cuff Size: Normal)   Pulse (!) 120   Temp 97.9 ?F (36.6 ?C) (Oral)   Resp 18   Ht 5' 6"  (1.676 m)   Wt 190 lb 12.8 oz (86.5 kg)   LMP 05/22/2019   SpO2 98%   BMI 30.80 kg/m?  ?Wt Readings from Last 3 Encounters:  ?05/13/21 190 lb 12.8 oz (86.5 kg)  ?08/16/20 199 lb 9.6 oz (90.5 kg)  ?08/14/20 200 lb 3.2 oz (90.8 kg)  ? ? ?Diabetic Foot Exam - Simple   ?No data filed ?  ? ?Lab Results  ?Component Value Date  ? WBC 5.6 08/14/2020  ? HGB 10.0 (L) 08/14/2020  ? HCT 31.3 (L) 08/14/2020  ? PLT 382.0 08/14/2020  ? GLUCOSE 150 (H) 08/14/2020  ? CHOL 188 08/14/2020  ? TRIG 95.0 08/14/2020  ? HDL 44.70 08/14/2020  ? LDLDIRECT 92.0 02/01/2015  ?  LDLCALC 124 (H) 08/14/2020  ? ALT 14 08/14/2020  ? AST 13 08/14/2020  ? NA 137 08/14/2020  ? K 4.2 08/14/2020  ? CL 103 08/14/2020  ? CREATININE 0.62 08/14/2020  ? BUN 6 08/14/2020  ? CO2 24 08/14/2020  ? TSH

## 2021-05-13 NOTE — Patient Instructions (Signed)

## 2021-05-13 NOTE — Assessment & Plan Note (Addendum)
Encourage heart healthy diet such as MIND or DASH diet, increase exercise, avoid trans fats, simple carbohydrates and processed foods, consider a krill or fish or flaxseed oil cap daily.  °

## 2021-05-13 NOTE — Assessment & Plan Note (Signed)
Check labs today and f/u endo  ?

## 2021-05-13 NOTE — Assessment & Plan Note (Signed)
Worsened by grief  ?Pt will look into counseling ---- pt given name and numbers of counselors  ?

## 2021-05-13 NOTE — Assessment & Plan Note (Signed)
Encourage heart healthy diet such as MIND or DASH diet, increase exercise, avoid trans fats, simple carbohydrates and processed foods, consider a krill or fish or flaxseed oil cap daily.  °

## 2021-05-14 LAB — CBC WITH DIFFERENTIAL/PLATELET
Basophils Absolute: 0.1 10*3/uL (ref 0.0–0.1)
Basophils Relative: 1.1 % (ref 0.0–3.0)
Eosinophils Absolute: 0.1 10*3/uL (ref 0.0–0.7)
Eosinophils Relative: 1.3 % (ref 0.0–5.0)
HCT: 33 % — ABNORMAL LOW (ref 36.0–46.0)
Hemoglobin: 10.7 g/dL — ABNORMAL LOW (ref 12.0–15.0)
Lymphocytes Relative: 28 % (ref 12.0–46.0)
Lymphs Abs: 1.9 10*3/uL (ref 0.7–4.0)
MCHC: 32.3 g/dL (ref 30.0–36.0)
MCV: 80.3 fl (ref 78.0–100.0)
Monocytes Absolute: 0.4 10*3/uL (ref 0.1–1.0)
Monocytes Relative: 5.6 % (ref 3.0–12.0)
Neutro Abs: 4.3 10*3/uL (ref 1.4–7.7)
Neutrophils Relative %: 64 % (ref 43.0–77.0)
Platelets: 468 10*3/uL — ABNORMAL HIGH (ref 150.0–400.0)
RBC: 4.11 Mil/uL (ref 3.87–5.11)
RDW: 16.1 % — ABNORMAL HIGH (ref 11.5–15.5)
WBC: 6.7 10*3/uL (ref 4.0–10.5)

## 2021-05-14 LAB — HEMOGLOBIN A1C: Hgb A1c MFr Bld: 6.2 % (ref 4.6–6.5)

## 2021-05-14 LAB — LIPID PANEL
Cholesterol: 206 mg/dL — ABNORMAL HIGH (ref 0–200)
HDL: 49.7 mg/dL (ref >39.00)
LDL Cholesterol: 135 mg/dL — ABNORMAL HIGH (ref 0–99)
NonHDL: 155.92
Total CHOL/HDL Ratio: 4
Triglycerides: 104 mg/dL (ref 0.0–149.0)
VLDL: 20.8 mg/dL (ref 0.0–40.0)

## 2021-05-14 LAB — COMPREHENSIVE METABOLIC PANEL
ALT: 11 U/L (ref 0–35)
AST: 14 U/L (ref 0–37)
Albumin: 4.3 g/dL (ref 3.5–5.2)
Alkaline Phosphatase: 92 U/L (ref 39–117)
BUN: 8 mg/dL (ref 6–23)
CO2: 25 mEq/L (ref 19–32)
Calcium: 10.1 mg/dL (ref 8.4–10.5)
Chloride: 101 mEq/L (ref 96–112)
Creatinine, Ser: 0.67 mg/dL (ref 0.40–1.20)
GFR: 103.63 mL/min (ref >60.00)
Glucose, Bld: 151 mg/dL — ABNORMAL HIGH (ref 70–99)
Potassium: 3.9 mEq/L (ref 3.5–5.1)
Sodium: 137 mEq/L (ref 135–145)
Total Bilirubin: 0.3 mg/dL (ref 0.2–1.2)
Total Protein: 7.7 g/dL (ref 6.0–8.3)

## 2021-05-14 LAB — TSH: TSH: 0.99 u[IU]/mL (ref 0.35–5.50)

## 2021-05-20 ENCOUNTER — Other Ambulatory Visit: Payer: Self-pay | Admitting: Family Medicine

## 2021-05-20 DIAGNOSIS — E785 Hyperlipidemia, unspecified: Secondary | ICD-10-CM

## 2021-05-20 DIAGNOSIS — E1165 Type 2 diabetes mellitus with hyperglycemia: Secondary | ICD-10-CM

## 2021-05-21 ENCOUNTER — Other Ambulatory Visit: Payer: Self-pay

## 2021-05-21 ENCOUNTER — Other Ambulatory Visit (HOSPITAL_COMMUNITY): Payer: Self-pay

## 2021-05-21 DIAGNOSIS — E785 Hyperlipidemia, unspecified: Secondary | ICD-10-CM

## 2021-05-21 MED ORDER — ROSUVASTATIN CALCIUM 40 MG PO TABS
40.0000 mg | ORAL_TABLET | Freq: Every day | ORAL | 2 refills | Status: DC
  Filled 2021-05-21: qty 90, 90d supply, fill #0
  Filled 2021-11-25: qty 30, 30d supply, fill #0
  Filled 2022-01-28: qty 30, 30d supply, fill #1
  Filled 2022-03-20: qty 30, 30d supply, fill #2

## 2021-05-21 MED ORDER — IRON (FERROUS SULFATE) 325 (65 FE) MG PO TABS
325.0000 mg | ORAL_TABLET | Freq: Every day | ORAL | 2 refills | Status: DC
Start: 2021-05-21 — End: 2022-01-17
  Filled 2021-05-21: qty 30, fill #0

## 2021-05-22 ENCOUNTER — Other Ambulatory Visit (HOSPITAL_COMMUNITY): Payer: Self-pay

## 2021-06-06 ENCOUNTER — Other Ambulatory Visit: Payer: Self-pay | Admitting: Family Medicine

## 2021-06-06 DIAGNOSIS — F411 Generalized anxiety disorder: Secondary | ICD-10-CM

## 2021-06-07 MED ORDER — ALPRAZOLAM 0.25 MG PO TABS
0.2500 mg | ORAL_TABLET | Freq: Three times a day (TID) | ORAL | 1 refills | Status: DC | PRN
  Filled 2021-06-11: qty 30, 10d supply, fill #0
  Filled 2021-06-24: qty 30, 10d supply, fill #1

## 2021-06-07 NOTE — Telephone Encounter (Signed)
Requesting: alprazolam 0.'25mg'$   Contract: 05/13/21 UDS: Ordered on 05/13/21 Last Visit: 05/13/21 Next Visit: 11/11/21 Last Refill: 05/13/21 #30 and 0RF Pt sig: 1 tab tid prn  Please Advise

## 2021-06-11 ENCOUNTER — Other Ambulatory Visit (HOSPITAL_COMMUNITY): Payer: Self-pay

## 2021-06-24 ENCOUNTER — Other Ambulatory Visit (HOSPITAL_COMMUNITY): Payer: Self-pay

## 2021-07-07 ENCOUNTER — Other Ambulatory Visit: Payer: Self-pay | Admitting: Family Medicine

## 2021-07-07 DIAGNOSIS — E1142 Type 2 diabetes mellitus with diabetic polyneuropathy: Secondary | ICD-10-CM

## 2021-07-07 DIAGNOSIS — F339 Major depressive disorder, recurrent, unspecified: Secondary | ICD-10-CM

## 2021-07-07 DIAGNOSIS — F411 Generalized anxiety disorder: Secondary | ICD-10-CM

## 2021-07-08 ENCOUNTER — Other Ambulatory Visit (HOSPITAL_COMMUNITY): Payer: Self-pay

## 2021-07-08 MED ORDER — DULOXETINE HCL 60 MG PO CPEP
60.0000 mg | ORAL_CAPSULE | Freq: Every day | ORAL | 0 refills | Status: DC
  Filled 2021-07-08: qty 90, 90d supply, fill #0

## 2021-07-09 ENCOUNTER — Other Ambulatory Visit (HOSPITAL_COMMUNITY): Payer: Self-pay

## 2021-07-10 ENCOUNTER — Other Ambulatory Visit (HOSPITAL_COMMUNITY): Payer: Self-pay

## 2021-07-10 MED ORDER — ALPRAZOLAM 0.25 MG PO TABS
0.2500 mg | ORAL_TABLET | Freq: Three times a day (TID) | ORAL | 1 refills | Status: DC | PRN
  Filled 2021-07-10: qty 30, 10d supply, fill #0
  Filled 2021-07-22: qty 30, 10d supply, fill #1

## 2021-07-23 ENCOUNTER — Other Ambulatory Visit (HOSPITAL_COMMUNITY): Payer: Self-pay

## 2021-08-05 ENCOUNTER — Other Ambulatory Visit: Payer: Self-pay | Admitting: Family Medicine

## 2021-08-05 ENCOUNTER — Other Ambulatory Visit (HOSPITAL_COMMUNITY): Payer: Self-pay

## 2021-08-05 DIAGNOSIS — F411 Generalized anxiety disorder: Secondary | ICD-10-CM

## 2021-08-05 MED ORDER — CYCLOPENTOLATE HCL 1 % OP SOLN
1.0000 [drp] | Freq: Three times a day (TID) | OPHTHALMIC | 0 refills | Status: DC
  Filled 2021-08-05 – 2021-08-07 (×2): qty 2, 14d supply, fill #0
  Filled 2021-08-07: qty 10, 66d supply, fill #0

## 2021-08-05 MED ORDER — PREDNISOLONE ACETATE 1 % OP SUSP
OPHTHALMIC | 0 refills | Status: AC
  Filled 2021-08-05: qty 10, 22d supply, fill #0

## 2021-08-06 ENCOUNTER — Other Ambulatory Visit (HOSPITAL_COMMUNITY): Payer: Self-pay

## 2021-08-06 MED ORDER — ALPRAZOLAM 0.25 MG PO TABS
0.2500 mg | ORAL_TABLET | Freq: Three times a day (TID) | ORAL | 1 refills | Status: DC | PRN
  Filled 2021-08-06: qty 30, 10d supply, fill #0
  Filled 2021-08-19: qty 30, 10d supply, fill #1

## 2021-08-06 NOTE — Telephone Encounter (Signed)
Requesting:xanax 0.25 mg Contract:05/13/21 UDS:05/13/21 Last Visit:05/13/21 Next Visit:11/11/21 Last Refill:07/10/21  Please Advise

## 2021-08-07 ENCOUNTER — Other Ambulatory Visit (HOSPITAL_COMMUNITY): Payer: Self-pay

## 2021-08-19 ENCOUNTER — Other Ambulatory Visit (HOSPITAL_COMMUNITY): Payer: Self-pay

## 2021-08-21 ENCOUNTER — Encounter (INDEPENDENT_AMBULATORY_CARE_PROVIDER_SITE_OTHER): Payer: Self-pay

## 2021-09-08 ENCOUNTER — Other Ambulatory Visit: Payer: Self-pay | Admitting: Family Medicine

## 2021-09-08 DIAGNOSIS — F411 Generalized anxiety disorder: Secondary | ICD-10-CM

## 2021-09-09 ENCOUNTER — Other Ambulatory Visit (HOSPITAL_COMMUNITY): Payer: Self-pay

## 2021-09-09 MED ORDER — ALPRAZOLAM 0.25 MG PO TABS
0.2500 mg | ORAL_TABLET | Freq: Three times a day (TID) | ORAL | 1 refills | Status: DC | PRN
  Filled 2021-09-09: qty 30, 10d supply, fill #0
  Filled 2021-10-03: qty 30, 10d supply, fill #1

## 2021-09-09 NOTE — Telephone Encounter (Signed)
Requesting: alprazolam 0.'25mg'$   Contract:05/13/21 UDS: 04/18/19 Last Visit: 05/13/21 Next Visit: 11/11/21 Last Refill: 08/06/21 #30 and 0RF  Please Advise

## 2021-10-03 ENCOUNTER — Other Ambulatory Visit: Payer: Self-pay | Admitting: Internal Medicine

## 2021-10-03 ENCOUNTER — Other Ambulatory Visit: Payer: Self-pay | Admitting: Family Medicine

## 2021-10-03 DIAGNOSIS — E119 Type 2 diabetes mellitus without complications: Secondary | ICD-10-CM

## 2021-10-03 DIAGNOSIS — I10 Essential (primary) hypertension: Secondary | ICD-10-CM

## 2021-10-03 DIAGNOSIS — B354 Tinea corporis: Secondary | ICD-10-CM

## 2021-10-04 ENCOUNTER — Other Ambulatory Visit (HOSPITAL_COMMUNITY): Payer: Self-pay

## 2021-10-04 ENCOUNTER — Telehealth: Payer: Self-pay | Admitting: Internal Medicine

## 2021-10-04 DIAGNOSIS — E119 Type 2 diabetes mellitus without complications: Secondary | ICD-10-CM

## 2021-10-04 MED ORDER — LOSARTAN POTASSIUM 25 MG PO TABS
25.0000 mg | ORAL_TABLET | Freq: Every day | ORAL | 1 refills | Status: DC
  Filled 2021-10-04: qty 30, 30d supply, fill #0
  Filled 2021-11-02: qty 30, 30d supply, fill #1
  Filled 2022-02-06: qty 30, 30d supply, fill #2
  Filled 2022-04-10: qty 30, 30d supply, fill #3
  Filled 2022-05-13: qty 30, 30d supply, fill #4
  Filled 2022-07-07: qty 30, 30d supply, fill #5

## 2021-10-04 MED ORDER — NYSTATIN 100000 UNIT/GM EX POWD
1.0000 | Freq: Three times a day (TID) | CUTANEOUS | 3 refills | Status: DC
  Filled 2021-10-04: qty 60, 20d supply, fill #0
  Filled 2021-11-02: qty 60, 20d supply, fill #1
  Filled 2021-12-12: qty 60, 20d supply, fill #2
  Filled 2022-01-28: qty 60, 20d supply, fill #3

## 2021-10-04 MED ORDER — TRULICITY 1.5 MG/0.5ML ~~LOC~~ SOAJ
1.5000 mg | SUBCUTANEOUS | 3 refills | Status: DC
  Filled 2021-10-04 – 2021-10-07 (×2): qty 6, 84d supply, fill #0
  Filled 2021-10-09: qty 2, 28d supply, fill #0
  Filled 2021-11-02: qty 2, 28d supply, fill #1
  Filled 2021-11-27: qty 2, 28d supply, fill #2
  Filled 2021-12-22: qty 2, 28d supply, fill #3
  Filled 2022-01-28: qty 2, 28d supply, fill #4
  Filled 2022-02-17 – 2022-02-18 (×2): qty 2, 28d supply, fill #5
  Filled 2022-04-10: qty 2, 28d supply, fill #6
  Filled 2022-05-13: qty 2, 28d supply, fill #7
  Filled 2022-06-18: qty 2, 28d supply, fill #8

## 2021-10-04 NOTE — Telephone Encounter (Signed)
MEDICATION: Trulicity  PHARMACY:  [facility] on [address] CONTACTED [facility]?  yes  IS THIS A 90 DAY SUPPLY : yes  IS PATIENT OUT OF MEDICATION: YES  IF NOT; HOW MUCH IS LEFT:   LAST APPOINTMENT DATE: '@2'$ /09/2021  NEXT APPOINTMENT DATE:'@2'$ /06/2022  DO WE HAVE YOUR PERMISSION TO LEAVE A DETAILED MESSAGE?: yes  OTHER COMMENTS:    **Let patient know to contact pharmacy at the end of the day to make sure medication is ready. **  ** Please notify patient to allow 48-72 hours to process**  **Encourage patient to contact the pharmacy for refills or they can request refills through [facility]**

## 2021-10-04 NOTE — Telephone Encounter (Signed)
RX now sent to preferred pharmacy.  

## 2021-10-06 ENCOUNTER — Other Ambulatory Visit: Payer: Self-pay | Admitting: Internal Medicine

## 2021-10-06 ENCOUNTER — Other Ambulatory Visit: Payer: Self-pay | Admitting: Family Medicine

## 2021-10-06 DIAGNOSIS — F339 Major depressive disorder, recurrent, unspecified: Secondary | ICD-10-CM

## 2021-10-06 DIAGNOSIS — E1142 Type 2 diabetes mellitus with diabetic polyneuropathy: Secondary | ICD-10-CM

## 2021-10-06 DIAGNOSIS — E119 Type 2 diabetes mellitus without complications: Secondary | ICD-10-CM

## 2021-10-07 ENCOUNTER — Other Ambulatory Visit (HOSPITAL_COMMUNITY): Payer: Self-pay

## 2021-10-07 MED ORDER — DULOXETINE HCL 60 MG PO CPEP
60.0000 mg | ORAL_CAPSULE | Freq: Every day | ORAL | 0 refills | Status: DC
  Filled 2021-10-07: qty 30, 30d supply, fill #0
  Filled 2021-11-02: qty 30, 30d supply, fill #1
  Filled 2021-12-12: qty 30, 30d supply, fill #2

## 2021-10-07 MED ORDER — GLIPIZIDE 5 MG PO TABS
7.5000 mg | ORAL_TABLET | Freq: Two times a day (BID) | ORAL | 1 refills | Status: DC
  Filled 2021-10-07: qty 90, 30d supply, fill #0
  Filled 2021-11-19: qty 90, 30d supply, fill #1
  Filled 2022-01-14: qty 90, 30d supply, fill #2
  Filled 2022-03-11: qty 90, 30d supply, fill #3
  Filled 2022-05-05: qty 90, 30d supply, fill #4
  Filled 2022-06-10: qty 90, 30d supply, fill #5

## 2021-10-09 ENCOUNTER — Other Ambulatory Visit (HOSPITAL_COMMUNITY): Payer: Self-pay

## 2021-10-09 ENCOUNTER — Telehealth: Payer: Self-pay

## 2021-10-09 NOTE — Telephone Encounter (Signed)
Patient Advocate Encounter  Prior Authorization for Trulicity 1.'5MG'$ /0.5ML pen-injectors has been approved.    Key: [id number] Effective dates: 10/09/21 through 10/10/22

## 2021-10-09 NOTE — Telephone Encounter (Signed)
Patient Advocate Encounter   Received notification that prior authorization for Trulicity 1.'5MG'$ /0.5ML pen-injectors is required/requested.   PA submitted on 10/09/21 to [employer] via CoverMyMeds  Key [id number]  Status is pending

## 2021-10-10 ENCOUNTER — Other Ambulatory Visit (HOSPITAL_COMMUNITY): Payer: Self-pay

## 2021-10-21 ENCOUNTER — Ambulatory Visit: Payer: 59 | Admitting: Orthopedic Surgery

## 2021-10-22 ENCOUNTER — Telehealth: Payer: Self-pay | Admitting: Orthopedic Surgery

## 2021-10-22 NOTE — Telephone Encounter (Signed)
Pt sent in a mychart to see Dr Sharol Given for swollen pained knee. Left pt a vm to return call and make appt.

## 2021-11-04 ENCOUNTER — Other Ambulatory Visit (HOSPITAL_COMMUNITY): Payer: Self-pay

## 2021-11-08 ENCOUNTER — Ambulatory Visit (INDEPENDENT_AMBULATORY_CARE_PROVIDER_SITE_OTHER): Payer: 59 | Admitting: Family

## 2021-11-08 ENCOUNTER — Encounter: Payer: Self-pay | Admitting: Family

## 2021-11-08 ENCOUNTER — Ambulatory Visit (INDEPENDENT_AMBULATORY_CARE_PROVIDER_SITE_OTHER): Payer: 59

## 2021-11-08 DIAGNOSIS — M25561 Pain in right knee: Secondary | ICD-10-CM

## 2021-11-08 DIAGNOSIS — G8929 Other chronic pain: Secondary | ICD-10-CM

## 2021-11-08 DIAGNOSIS — M1711 Unilateral primary osteoarthritis, right knee: Secondary | ICD-10-CM | POA: Diagnosis not present

## 2021-11-08 NOTE — Progress Notes (Signed)
Office Visit Note   Patient: Cheryl Hart           Date of Birth: 20-May-1975           MRN: [id number] Visit Date: 11/08/2021              Requested by: Carollee Herter, [place], [place] [address] [place],  [place] [address] PCP: Carollee Herter, Alferd Apa, DO  Chief Complaint  Patient presents with   Right Knee - Pain      HPI: The patient is a 46 year old woman who is seen today for complaint of right knee pain primarily in the lateral compartment associate with swelling this has been ongoing since November of last year is minimally improved she does not associate this with any specific injury however had significant swelling and pain at the time occasionally has popping which is very painful intermittent swelling she feels giving way has not had any falls  Tight sensation feels she lacks full extension  [facility] did have a cortisone injection which did not provide any relief.  Has also completed physical therapy without improvement.  Assessment & Plan: Visit Diagnoses:  1. Chronic pain of right knee     Plan: Lengthy discussion about osteoarthritis possibility of meniscal and chondral degenerative changes consider MRI at this point we will proceed with supplemental injection  Follow-Up Instructions: Return if symptoms worsen or fail to improve.   Right Knee Exam   Tenderness  The patient is experiencing tenderness in the lateral joint line.  Range of Motion  The patient has normal right knee ROM.  Tests  Varus: negative Valgus: negative  Other  Effusion: no effusion present      Patient is alert, oriented, no adenopathy, well-dressed, normal affect, normal respiratory effort.   Imaging: XR Knee 1-2 Views Right  Result Date: 11/08/2021 Radiographs of the right knee show moderate to severe degenerative changes in all 3 compartments.  No acute finding.  No images are attached to the encounter.  Labs: Lab Results   Component Value Date   HGBA1C 6.2 05/13/2021   HGBA1C 6.5 08/14/2020   HGBA1C 7.1 (A) 04/13/2020   REPTSTATUS 12/23/2016 FINAL 12/21/2016   CULT >=100,000 COLONIES/mL ESCHERICHIA COLI (A) 12/21/2016   LABORGA ESCHERICHIA COLI (A) 12/21/2016     Lab Results  Component Value Date   ALBUMIN 4.3 05/13/2021   ALBUMIN 4.0 08/14/2020   ALBUMIN 4.1 12/29/2019    No results found for: "MG" Lab Results  Component Value Date   VD25OH 18.1 (L) 02/09/2019    No results found for: "PREALBUMIN"    Latest Ref Rng & Units 05/13/2021    2:25 PM 08/14/2020    1:45 PM 02/09/2019   12:08 PM  CBC EXTENDED  WBC 4.0 - 10.5 K/uL 6.7  5.6  6.7   RBC 3.87 - 5.11 Mil/uL 4.11  4.11  4.37   Hemoglobin 12.0 - 15.0 g/dL 10.7  10.0  11.3   HCT 36.0 - 46.0 % 33.0  31.3  35.0   Platelets 150.0 - 400.0 K/uL 468.0  382.0  407   NEUT# 1.4 - 7.7 K/uL 4.3  3.3  3.7   Lymph# 0.7 - 4.0 K/uL 1.9  1.8  2.4      There is no height or weight on file to calculate BMI.  Orders:  Orders Placed This Encounter  Procedures   XR Knee 1-2 Views Right   No  orders of the defined types were placed in this encounter.    Procedures: No procedures performed  Clinical Data: No additional findings.  ROS:  All other systems negative, except as noted in the HPI. Review of Systems  Objective: Vital Signs: LMP 05/22/2019   Specialty Comments:  No specialty comments available.  PMFS History: Patient Active Problem List   Diagnosis Date Noted   Depression 09/21/2020   PCOS (polycystic ovarian syndrome) 09/21/2020   Vitamin B12 deficiency 09/21/2020   Type 2 diabetes mellitus without complication, without long-term current use of insulin ([place]) 08/16/2020   Preventative health care 08/14/2020   Type 2 diabetes mellitus with hyperglycemia, without long-term current use of insulin ([place]) 04/13/2020   Toe pain, bilateral 04/13/2020   Generalized anxiety disorder 04/18/2019   Diabetic polyneuropathy associated with  type 2 diabetes mellitus ([place]) 04/18/2019   Anemia    Dyslipidemia 12/01/2018   Tachycardia 12/01/2018   HTN (hypertension) 10/01/2018   Fibroids    Fibroid, uterine    Obesity (BMI 30-39.9) 02/21/2013   Hyperlipidemia 03/13/2011   MORBID OBESITY 08/08/2008   Uncontrolled type 2 diabetes mellitus with complication, without long-term current use of insulin 03/31/2008   UNSPECIFIED ANEMIA 03/31/2008   GLYCOSURIA 03/29/2008   LIVER FUNCTION TESTS, ABNORMAL, HX OF 02/28/2007   ABDOMINAL PAIN 02/17/2007   ANXIETY 11/18/2006   GESTATIONAL DIABETES 11/18/2006   FATIGUE 11/18/2006   HEADACHE 11/18/2006   ABDOMINAL PAIN, EPIGASTRIC 11/18/2006   Past Medical History:  Diagnosis Date   Anemia    Anxiety    Depression    Diabetes mellitus    Hyperlipidemia    Hypertension    PCOS (polycystic ovarian syndrome)    Uterine fibroid    Vitamin B12 deficiency     Family History  Problem Relation Age of Onset   Migraines Other    Diabetes Mother    Hypertension Mother    Hyperlipidemia Mother    Kidney disease Mother    Thyroid disease Mother    Anxiety disorder Mother    Diabetes Father    Hypertension Father    Anxiety disorder Father    Liver disease Father    Alcoholism Father    Drug abuse Father    Depression Father    Diabetes Brother     Past Surgical History:  Procedure Laterality Date   CESAREAN SECTION     CHOLECYSTECTOMY     g1 p1     LAPAROSCOPIC GELPORT ASSISTED MYOMECTOMY  02/12/2015   Dr Barbie Banner   Social History   Occupational History   Occupation: Engineer, building services  Tobacco Use   Smoking status: Never   Smokeless tobacco: Never  Vaping Use   Vaping Use: Never used  Substance and Sexual Activity   Alcohol use: No   Drug use: No   Sexual activity: Yes    Partners: Male    Birth control/protection: Pill

## 2021-11-11 ENCOUNTER — Encounter: Payer: Self-pay | Admitting: Family Medicine

## 2021-11-11 ENCOUNTER — Other Ambulatory Visit (HOSPITAL_COMMUNITY): Payer: Self-pay

## 2021-11-11 ENCOUNTER — Telehealth: Payer: Self-pay

## 2021-11-11 ENCOUNTER — Ambulatory Visit (INDEPENDENT_AMBULATORY_CARE_PROVIDER_SITE_OTHER): Payer: 59 | Admitting: Family Medicine

## 2021-11-11 VITALS — BP 130/100 | HR 118 | Temp 97.8°F | Resp 18 | Ht 66.0 in | Wt 202.2 lb

## 2021-11-11 DIAGNOSIS — Z0000 Encounter for general adult medical examination without abnormal findings: Secondary | ICD-10-CM | POA: Diagnosis not present

## 2021-11-11 DIAGNOSIS — F411 Generalized anxiety disorder: Secondary | ICD-10-CM

## 2021-11-11 DIAGNOSIS — I10 Essential (primary) hypertension: Secondary | ICD-10-CM | POA: Insufficient documentation

## 2021-11-11 DIAGNOSIS — R002 Palpitations: Secondary | ICD-10-CM | POA: Diagnosis not present

## 2021-11-11 DIAGNOSIS — Z23 Encounter for immunization: Secondary | ICD-10-CM | POA: Diagnosis not present

## 2021-11-11 DIAGNOSIS — E1165 Type 2 diabetes mellitus with hyperglycemia: Secondary | ICD-10-CM | POA: Diagnosis not present

## 2021-11-11 LAB — CBC WITH DIFFERENTIAL/PLATELET
Basophils Absolute: 0 10*3/uL (ref 0.0–0.1)
Basophils Relative: 0.6 % (ref 0.0–3.0)
Eosinophils Absolute: 0.2 10*3/uL (ref 0.0–0.7)
Eosinophils Relative: 4 % (ref 0.0–5.0)
HCT: 31.1 % — ABNORMAL LOW (ref 36.0–46.0)
Hemoglobin: 10.1 g/dL — ABNORMAL LOW (ref 12.0–15.0)
Lymphocytes Relative: 37.9 % (ref 12.0–46.0)
Lymphs Abs: 2.1 10*3/uL (ref 0.7–4.0)
MCHC: 32.3 g/dL (ref 30.0–36.0)
MCV: 77.4 fl — ABNORMAL LOW (ref 78.0–100.0)
Monocytes Absolute: 0.4 10*3/uL (ref 0.1–1.0)
Monocytes Relative: 7.1 % (ref 3.0–12.0)
Neutro Abs: 2.8 10*3/uL (ref 1.4–7.7)
Neutrophils Relative %: 50.4 % (ref 43.0–77.0)
Platelets: 447 10*3/uL — ABNORMAL HIGH (ref 150.0–400.0)
RBC: 4.02 Mil/uL (ref 3.87–5.11)
RDW: 15.7 % — ABNORMAL HIGH (ref 11.5–15.5)
WBC: 5.6 10*3/uL (ref 4.0–10.5)

## 2021-11-11 LAB — COMPREHENSIVE METABOLIC PANEL
ALT: 8 U/L (ref 0–35)
AST: 9 U/L (ref 0–37)
Albumin: 4.1 g/dL (ref 3.5–5.2)
Alkaline Phosphatase: 100 U/L (ref 39–117)
BUN: 10 mg/dL (ref 6–23)
CO2: 25 mEq/L (ref 19–32)
Calcium: 10 mg/dL (ref 8.4–10.5)
Chloride: 104 mEq/L (ref 96–112)
Creatinine, Ser: 0.49 mg/dL (ref 0.40–1.20)
GFR: 111.36 mL/min (ref >60.00)
Glucose, Bld: 102 mg/dL — ABNORMAL HIGH (ref 70–99)
Potassium: 4.1 mEq/L (ref 3.5–5.1)
Sodium: 138 mEq/L (ref 135–145)
Total Bilirubin: 0.2 mg/dL (ref 0.2–1.2)
Total Protein: 7.3 g/dL (ref 6.0–8.3)

## 2021-11-11 LAB — HEMOGLOBIN A1C: Hgb A1c MFr Bld: 6.5 % (ref 4.6–6.5)

## 2021-11-11 LAB — LIPID PANEL
Cholesterol: 205 mg/dL — ABNORMAL HIGH (ref 0–200)
HDL: 45 mg/dL (ref >39.00)
LDL Cholesterol: 141 mg/dL — ABNORMAL HIGH (ref 0–99)
NonHDL: 159.85
Total CHOL/HDL Ratio: 5
Triglycerides: 92 mg/dL (ref 0.0–149.0)
VLDL: 18.4 mg/dL (ref 0.0–40.0)

## 2021-11-11 LAB — MICROALBUMIN / CREATININE URINE RATIO
Creatinine,U: 260.1 mg/dL
Microalb Creat Ratio: 1.4 mg/g (ref 0.0–30.0)
Microalb, Ur: 3.7 mg/dL — ABNORMAL HIGH (ref 0.0–1.9)

## 2021-11-11 LAB — VITAMIN D 25 HYDROXY (VIT D DEFICIENCY, FRACTURES): VITD: 27.47 ng/mL — ABNORMAL LOW (ref 30.00–100.00)

## 2021-11-11 LAB — VITAMIN B12: Vitamin B-12: 284 pg/mL (ref 211–911)

## 2021-11-11 MED ORDER — METOPROLOL SUCCINATE ER 50 MG PO TB24
50.0000 mg | ORAL_TABLET | Freq: Every day | ORAL | 3 refills | Status: DC
  Filled 2021-11-11: qty 30, 30d supply, fill #0
  Filled 2021-12-12: qty 30, 30d supply, fill #1
  Filled 2022-01-14: qty 30, 30d supply, fill #2
  Filled 2022-02-17: qty 30, 30d supply, fill #3
  Filled 2022-03-20: qty 30, 30d supply, fill #4
  Filled 2022-07-31: qty 30, 30d supply, fill #5

## 2021-11-11 MED ORDER — ALPRAZOLAM 0.25 MG PO TABS
0.2500 mg | ORAL_TABLET | Freq: Three times a day (TID) | ORAL | 1 refills | Status: DC | PRN
  Filled 2021-11-11: qty 30, 10d supply, fill #0
  Filled 2021-12-04: qty 30, 10d supply, fill #1

## 2021-11-11 NOTE — Telephone Encounter (Signed)
-----   Message from Suzan Slick, NP sent at 11/08/2021 10:41 AM EDT ----- Please order supplemental injection for the right knee

## 2021-11-11 NOTE — Progress Notes (Addendum)
Subjective:   By signing my name below, I, [name], attest that this documentation has been prepared under the direction and in the presence of Ann Held DO 11/11/2021   Patient ID: Cheryl Hart, female    DOB: 09-Dec-1975, 46 y.o.   MRN: [id number]  Chief Complaint  Patient presents with   Annual Exam    Pt states fasting    HPI Patient is in today for a comprehensive physical exam  She is requesting a refill of 0.25 Mg of Xanax  She is requesting a referral to a cardiologist. She believes that her palpitations are worsening to the point where she can feel them. She notes that last week her heart rate was about 122. As of today's visit, her blood pressure and heart rate are elevated. She denies of any chest pains at the moment.  BP Readings from Last 3 Encounters:  11/11/21 (!) 130/100  05/13/21 124/80  08/16/20 120/80   Pulse Readings from Last 3 Encounters:  11/11/21 (!) 118  05/13/21 (!) 120  08/16/20 97   She states that she does not check her blood sugar levels at home Lab Results  Component Value Date   HGBA1C 6.2 05/13/2021   She denies having any fever, new muscle pain, joint pain , new moles, congestion, sinus pain, sore throat, chest pain, cough, SOB ,wheezing,n/v/d constipation, blood in stool, dysuria, frequency, hematuria, at this time  She reports that her mother passed away.  Pap Smear was last completed on 11/22/2021 She is interested in receiving an influenza vaccine during today's visit She is UTD on dental exams She is UTD on vision exams   Past Medical History:  Diagnosis Date   Anemia    Anxiety    Depression    Diabetes mellitus    Hyperlipidemia    Hypertension    PCOS (polycystic ovarian syndrome)    Uterine fibroid    Vitamin B12 deficiency     Past Surgical History:  Procedure Laterality Date   CESAREAN SECTION     CHOLECYSTECTOMY     g1 p1     LAPAROSCOPIC GELPORT ASSISTED MYOMECTOMY  02/12/2015   Dr Barbie Banner     Family History  Problem Relation Age of Onset   Diabetes Mother    Hypertension Mother    Hyperlipidemia Mother    Kidney disease Mother    Thyroid disease Mother    Anxiety disorder Mother    Diabetes Father    Hypertension Father    Anxiety disorder Father    Liver disease Father    Alcoholism Father    Drug abuse Father    Depression Father    Diabetes Brother    Migraines Other     Social History   Socioeconomic History   Marital status: Single    Spouse name: Not on file   Number of children: Not on file   Years of education: Not on file   Highest education level: Not on file  Occupational History   Occupation: Engineer, building services  Tobacco Use   Smoking status: Never   Smokeless tobacco: Never  Vaping Use   Vaping Use: Never used  Substance and Sexual Activity   Alcohol use: No   Drug use: No   Sexual activity: Yes    Partners: Male    Birth control/protection: Pill  Other Topics Concern   Not on file  Social History Narrative   Exercise-- no   Social Determinants of Health   Financial  Resource Strain: Not on file  Food Insecurity: Not on file  Transportation Needs: Not on file  Physical Activity: Not on file  Stress: Not on file  Social Connections: Not on file  Intimate Partner Violence: Not on file    Outpatient Medications Prior to Visit  Medication Sig Dispense Refill   Biotin 5000 MCG TABS Take 5,000 mcg by mouth daily.      Blood Glucose Monitoring Suppl (BLOOD GLUCOSE MONITOR SYSTEM) w/Device KIT use as drected to check blood sugar 1 kit 0   cyclopentolate (CYCLODRYL,CYCLOGYL) 1 % ophthalmic solution Place 1 drop into the left eye 3 (three) times daily. 15 mL 0   diphenhydrAMINE (BENADRYL) 25 MG tablet Take 50 mg by mouth at bedtime as needed for sleep.      Dulaglutide (TRULICITY) 1.5 ME/2.6ST SOPN Inject 1.5 mg into the skin once a week. 6 mL 3   DULoxetine (CYMBALTA) 60 MG capsule Take 1 capsule (60 mg total) by mouth daily. 90 capsule 0    glipiZIDE (GLUCOTROL) 5 MG tablet Take 1.5 tablets (7.5 mg total) by mouth 2 (two) times daily before a meal. 270 tablet 1   glucose blood (FREESTYLE LITE) test strip Use as instructed to check blood sugar 2 times daily 100 each 5   ibuprofen (ADVIL) 600 MG tablet Take 1 tablet (600 mg total) by mouth every 6 (six) hours as needed. 30 tablet 2   Iron, Ferrous Sulfate, 325 (65 Fe) MG TABS Take 325 mg by mouth daily. 30 tablet 2   losartan (COZAAR) 25 MG tablet Take 1 tablet (25 mg total) by mouth daily. 90 tablet 1   metFORMIN (GLUCOPHAGE) 850 MG tablet Take 1 tablet (850 mg total) by mouth 2 (two) times daily with a meal. 180 tablet 3   Multiple Vitamin (MULTIVITAMIN WITH MINERALS) TABS tablet Take 1 tablet by mouth daily.     naproxen (NAPROSYN) 500 MG tablet Take 1 tablet (500 mg total) by mouth 2 (two) times daily as needed. 60 tablet 1   nystatin (NYAMYC) powder Apply 1 application topically 3 (three) times daily. 60 g 3   rosuvastatin (CRESTOR) 40 MG tablet Take 1 tablet (40 mg total) by mouth daily. 30 tablet 2   ALPRAZolam (XANAX) 0.25 MG tablet Take 1 tablet (0.25 mg total) by mouth 3 (three) times daily as needed. 30 tablet 1   No facility-administered medications prior to visit.    Allergies  Allergen Reactions   Sulfa Antibiotics Other (See Comments) and Rash   Sulfonamide Derivatives Photosensitivity, Rash and Other (See Comments)    Severe headache    Review of Systems  Constitutional:  Negative for fever and malaise/fatigue.  HENT:  Negative for congestion, sinus pain and sore throat.   Eyes:  Negative for blurred vision.  Respiratory:  Negative for cough, shortness of breath and wheezing.   Cardiovascular:  Positive for palpitations. Negative for chest pain and leg swelling.  Gastrointestinal:  Negative for abdominal pain, blood in stool, constipation, diarrhea, nausea and vomiting.  Genitourinary:  Negative for dysuria, frequency and hematuria.  Musculoskeletal:   Negative for back pain, falls, joint pain and myalgias.  Skin:  Negative for rash.       (-) New Moles  Neurological:  Negative for dizziness, loss of consciousness and headaches.  Endo/Heme/Allergies:  Negative for environmental allergies.  Psychiatric/Behavioral:  Negative for depression. The patient is not nervous/anxious.        Objective:    Physical Exam Vitals and nursing  note reviewed.  Constitutional:      General: She is not in acute distress.    Appearance: Normal appearance. She is well-developed. She is not ill-appearing.  HENT:     Head: Normocephalic and atraumatic.     Right Ear: Tympanic membrane, ear canal and external ear normal.     Left Ear: Tympanic membrane, ear canal and external ear normal.     Nose: Nose normal.  Eyes:     Extraocular Movements: Extraocular movements intact.     Pupils: Pupils are equal, round, and reactive to light.  Cardiovascular:     Rate and Rhythm: Regular rhythm. Tachycardia present.     Heart sounds: Normal heart sounds. No murmur heard.    No gallop.     Comments: EKG normal  Pulmonary:     Effort: Pulmonary effort is normal. No respiratory distress.     Breath sounds: Normal breath sounds. No wheezing or rales.  Chest:     Chest wall: No tenderness.  Abdominal:     General: Bowel sounds are normal. There is no distension.     Palpations: Abdomen is soft.     Tenderness: There is no abdominal tenderness. There is no guarding.  Musculoskeletal:        General: No swelling or tenderness. Normal range of motion.     Cervical back: Normal range of motion and neck supple.  Skin:    General: Skin is warm and dry.  Neurological:     General: No focal deficit present.     Mental Status: She is alert and oriented to person, place, and time.     Gait: Gait normal.  Psychiatric:        Mood and Affect: Mood normal.        Behavior: Behavior normal.        Thought Content: Thought content normal.        Judgment: Judgment  normal.     BP (!) 130/100 (BP Location: Left Arm, Patient Position: Sitting, Cuff Size: Normal)   Pulse (!) 118   Temp 97.8 F (36.6 C) (Oral)   Resp 18   Ht _0  (1.676 m)   Wt 202 lb 3.2 oz (91.7 kg)   LMP 05/22/2019   SpO2 98%   BMI 32.64 kg/m  Wt Readings from Last 3 Encounters:  11/11/21 202 lb 3.2 oz (91.7 kg)  05/13/21 190 lb 12.8 oz (86.5 kg)  08/16/20 199 lb 9.6 oz (90.5 kg)    Diabetic Foot Exam - Simple   No data filed    Lab Results  Component Value Date   WBC 6.7 05/13/2021   HGB 10.7 (L) 05/13/2021   HCT 33.0 (L) 05/13/2021   PLT 468.0 (H) 05/13/2021   GLUCOSE 151 (H) 05/13/2021   CHOL 206 (H) 05/13/2021   TRIG 104.0 05/13/2021   HDL 49.70 05/13/2021   LDLDIRECT 92.0 02/01/2015   LDLCALC 135 (H) 05/13/2021   ALT 11 05/13/2021   AST 14 05/13/2021   NA 137 05/13/2021   K 3.9 05/13/2021   CL 101 05/13/2021   CREATININE 0.67 05/13/2021   BUN 8 05/13/2021   CO2 25 05/13/2021   TSH 0.99 05/13/2021   INR 1.12 02/12/2015   HGBA1C 6.2 05/13/2021   MICROALBUR 2.3 (H) 08/14/2020    Lab Results  Component Value Date   TSH 0.99 05/13/2021   Lab Results  Component Value Date   WBC 6.7 05/13/2021   HGB 10.7 (L) 05/13/2021  HCT 33.0 (L) 05/13/2021   MCV 80.3 05/13/2021   PLT 468.0 (H) 05/13/2021   Lab Results  Component Value Date   NA 137 05/13/2021   K 3.9 05/13/2021   CO2 25 05/13/2021   GLUCOSE 151 (H) 05/13/2021   BUN 8 05/13/2021   CREATININE 0.67 05/13/2021   BILITOT 0.3 05/13/2021   ALKPHOS 92 05/13/2021   AST 14 05/13/2021   ALT 11 05/13/2021   PROT 7.7 05/13/2021   ALBUMIN 4.3 05/13/2021   CALCIUM 10.1 05/13/2021   ANIONGAP 8 07/10/2017   GFR 103.63 05/13/2021   Lab Results  Component Value Date   CHOL 206 (H) 05/13/2021   Lab Results  Component Value Date   HDL 49.70 05/13/2021   Lab Results  Component Value Date   LDLCALC 135 (H) 05/13/2021   Lab Results  Component Value Date   TRIG 104.0 05/13/2021   Lab  Results  Component Value Date   CHOLHDL 4 05/13/2021   Lab Results  Component Value Date   HGBA1C 6.2 05/13/2021   Ekg---- nsr     Assessment & Plan:   Problem List Items Addressed This Visit       Unprioritized   Generalized anxiety disorder   Relevant Medications   ALPRAZolam (XANAX) 0.25 MG tablet   Type 2 diabetes mellitus with hyperglycemia, without long-term current use of insulin (HCC)    hgba1c to be checked, minimize simple carbs. Increase exercise as tolerated. Continue current meds       Relevant Orders   Hemoglobin A1c   Microalbumin / creatinine urine ratio   Preventative health care - Primary    ghm utd Check labs  See avs      Relevant Orders   CBC with Differential/Platelet   Comprehensive metabolic panel   Lipid panel   Vitamin B12   VITAMIN D 25 Hydroxy (Vit-D Deficiency, Fractures)   Thyroid Panel With TSH   Palpitations   Relevant Medications   metoprolol succinate (TOPROL-XL) 50 MG 24 hr tablet   Other Relevant Orders   EKG 12-Lead (Completed)   Ambulatory referral to Cardiology   CBC with Differential/Platelet   Comprehensive metabolic panel   Vitamin H60   VITAMIN D 25 Hydroxy (Vit-D Deficiency, Fractures)   Thyroid Panel With TSH   ECHOCARDIOGRAM COMPLETE   Cardiac event monitor   Essential hypertension    Poorly controlled will alter medications, encouraged DASH diet, minimize caffeine and obtain adequate sleep. Report concerning symptoms and follow up as directed and as needed      Relevant Medications   metoprolol succinate (TOPROL-XL) 50 MG 24 hr tablet   Other Relevant Orders   ECHOCARDIOGRAM COMPLETE   Cardiac event monitor   Other Visit Diagnoses     Need for influenza vaccination       Relevant Orders   Flu Vaccine QUAD 2moIM (Fluarix, Fluzone & Alfiuria Quad PF) (Completed)      Meds ordered this encounter  Medications   ALPRAZolam (XANAX) 0.25 MG tablet    Sig: Take 1 tablet (0.25 mg total) by mouth 3 (three)  times daily as needed.    Dispense:  30 tablet    Refill:  1   metoprolol succinate (TOPROL-XL) 50 MG 24 hr tablet    Sig: Take 1 tablet (50 mg total) by mouth daily. Take with or immediately following a meal.    Dispense:  90 tablet    Refill:  3    I, YAnn Held DO,  personally preformed the services described in this documentation.  All medical record entries made by the scribe were at my direction and in my presence.  I have reviewed the chart and discharge instructions (if applicable) and agree that the record reflects my personal performance and is accurate and complete. 11/11/2021   I,Amber Collins,acting as a scribe for Ann Held, DO.,have documented all relevant documentation on the behalf of Ann Held, DO,as directed by  Ann Held, DO while in the presence of Ann Held, DO.    Ann Held, DO

## 2021-11-11 NOTE — Assessment & Plan Note (Signed)
ghm utd Check labs  See avs  

## 2021-11-11 NOTE — Telephone Encounter (Signed)
VOB submitted for Durolane, right knee.  

## 2021-11-11 NOTE — Assessment & Plan Note (Signed)
hgba1c to be checked, minimize simple carbs. Increase exercise as tolerated. Continue current meds  

## 2021-11-11 NOTE — Assessment & Plan Note (Signed)
Poorly controlled will alter medications, encouraged DASH diet, minimize caffeine and obtain adequate sleep. Report concerning symptoms and follow up as directed and as needed 

## 2021-11-12 LAB — THYROID PANEL WITH TSH
Free Thyroxine Index: 2.3 (ref 1.4–3.8)
T3 Uptake: 27 % (ref 22–35)
T4, Total: 8.4 ug/dL (ref 5.1–11.9)
TSH: 1.24 mIU/L

## 2021-11-15 ENCOUNTER — Ambulatory Visit: Payer: 59 | Attending: Family Medicine

## 2021-11-15 DIAGNOSIS — I10 Essential (primary) hypertension: Secondary | ICD-10-CM

## 2021-11-15 DIAGNOSIS — R002 Palpitations: Secondary | ICD-10-CM | POA: Diagnosis not present

## 2021-11-19 ENCOUNTER — Other Ambulatory Visit (HOSPITAL_COMMUNITY): Payer: Self-pay

## 2021-11-20 ENCOUNTER — Telehealth: Payer: Self-pay

## 2021-11-20 ENCOUNTER — Other Ambulatory Visit: Payer: Self-pay

## 2021-11-20 ENCOUNTER — Other Ambulatory Visit: Payer: Self-pay | Admitting: Family Medicine

## 2021-11-20 DIAGNOSIS — E1165 Type 2 diabetes mellitus with hyperglycemia: Secondary | ICD-10-CM

## 2021-11-20 DIAGNOSIS — E785 Hyperlipidemia, unspecified: Secondary | ICD-10-CM

## 2021-11-20 DIAGNOSIS — M1711 Unilateral primary osteoarthritis, right knee: Secondary | ICD-10-CM

## 2021-11-20 DIAGNOSIS — I10 Essential (primary) hypertension: Secondary | ICD-10-CM

## 2021-11-20 NOTE — Telephone Encounter (Signed)
Called [facility] to clarify if PA was required for Durolane.  Per Quillian Quince F.with [facility], no PA is required for Durolane,([id number]). Reference# [id number]   Called and left a VM for patient to CB and schedule Check referrals tab.

## 2021-11-22 ENCOUNTER — Encounter: Payer: Self-pay | Admitting: Family Medicine

## 2021-11-22 ENCOUNTER — Other Ambulatory Visit: Payer: Self-pay | Admitting: *Deleted

## 2021-11-22 ENCOUNTER — Encounter: Payer: Self-pay | Admitting: Radiology

## 2021-11-22 ENCOUNTER — Ambulatory Visit: Payer: 59 | Admitting: Family

## 2021-11-22 ENCOUNTER — Other Ambulatory Visit (HOSPITAL_COMMUNITY): Payer: Self-pay

## 2021-11-22 ENCOUNTER — Encounter: Payer: Self-pay | Admitting: Family

## 2021-11-22 DIAGNOSIS — M1711 Unilateral primary osteoarthritis, right knee: Secondary | ICD-10-CM | POA: Diagnosis not present

## 2021-11-22 MED ORDER — VITAMIN D (ERGOCALCIFEROL) 1.25 MG (50000 UNIT) PO CAPS
50000.0000 [IU] | ORAL_CAPSULE | ORAL | 0 refills | Status: DC
  Filled 2021-11-22: qty 4, 28d supply, fill #0
  Filled 2021-12-22: qty 4, 28d supply, fill #1
  Filled 2022-01-14: qty 4, 28d supply, fill #2

## 2021-11-22 MED ORDER — SODIUM HYALURONATE 60 MG/3ML IX PRSY
60.0000 mg | PREFILLED_SYRINGE | INTRA_ARTICULAR | Status: AC | PRN
  Administered 2021-11-22: 60 mg via INTRA_ARTICULAR

## 2021-11-22 MED ORDER — LIDOCAINE HCL 1 % IJ SOLN
1.0000 mL | INTRAMUSCULAR | Status: AC | PRN
  Administered 2021-11-22: 1 mL

## 2021-11-22 NOTE — Progress Notes (Signed)
Office Visit Note   Patient: Cheryl Hart           Date of Birth: [birth year]           MRN: [id number] Visit Date: 11/22/2021              Requested by: Carollee Herter, [place], [place] [address] [place],  [place] [address] PCP: Carollee Herter, Alferd Apa, DO  Chief Complaint  Patient presents with   Right Knee - Follow-up    Approved buy and bill Durolane injection       HPI: The patient is a 46 year old woman who presents today for planned Durolane injection right knee  Assessment & Plan: Visit Diagnoses: No diagnosis found.  Plan: Durolane injection right knee.  Patient tolerated well.  Instructed on conservative measures ice anti-inflammatories by mouth she will follow-up as needed  Follow-Up Instructions: Return if symptoms worsen or fail to improve.   Ortho Exam  Patient is alert, oriented, no adenopathy, well-dressed, normal affect, normal respiratory effort.   Imaging: No results found. No images are attached to the encounter.  Labs: Lab Results  Component Value Date   HGBA1C 6.5 11/11/2021   HGBA1C 6.2 05/13/2021   HGBA1C 6.5 08/14/2020   REPTSTATUS 12/23/2016 FINAL 12/21/2016   CULT >=100,000 COLONIES/mL ESCHERICHIA COLI (A) 12/21/2016   LABORGA ESCHERICHIA COLI (A) 12/21/2016     Lab Results  Component Value Date   ALBUMIN 4.1 11/11/2021   ALBUMIN 4.3 05/13/2021   ALBUMIN 4.0 08/14/2020    No results found for: "MG" Lab Results  Component Value Date   VD25OH 27.47 (L) 11/11/2021   VD25OH 18.1 (L) 02/09/2019    No results found for: "PREALBUMIN"    Latest Ref Rng & Units 11/11/2021    9:26 AM 05/13/2021    2:25 PM 08/14/2020    1:45 PM  CBC EXTENDED  WBC 4.0 - 10.5 K/uL 5.6  6.7  5.6   RBC 3.87 - 5.11 Mil/uL 4.02  4.11  4.11   Hemoglobin 12.0 - 15.0 g/dL 10.1  10.7  10.0   HCT 36.0 - 46.0 % 31.1  33.0  31.3   Platelets 150.0 - 400.0 K/uL 447.0  468.0  382.0   NEUT# 1.4 - 7.7 K/uL 2.8  4.3  3.3   Lymph# 0.7 - 4.0 K/uL 2.1  1.9   1.8      There is no height or weight on file to calculate BMI.  Orders:  No orders of the defined types were placed in this encounter.  No orders of the defined types were placed in this encounter.    Procedures: Large Joint Inj: R knee on 11/22/2021 9:48 AM Medications: 1 mL lidocaine 1 %; 60 mg Sodium Hyaluronate 60 MG/3ML    Clinical Data: No additional findings.  ROS:  All other systems negative, except as noted in the HPI. Review of Systems  All other systems reviewed and are negative.   Objective: Vital Signs: LMP 05/22/2019   Specialty Comments:  No specialty comments available.  PMFS History: Patient Active Problem List   Diagnosis Date Noted   Palpitations 11/11/2021   Essential hypertension 11/11/2021   Depression 09/21/2020   PCOS (polycystic ovarian syndrome) 09/21/2020   Vitamin B12 deficiency 09/21/2020   Type 2 diabetes mellitus without complication, without long-term current use of insulin ([place]) 08/16/2020   Preventative health care 08/14/2020   Type 2 diabetes mellitus with hyperglycemia, without long-term current use of insulin ([place])  04/13/2020   Toe pain, bilateral 04/13/2020   Generalized anxiety disorder 04/18/2019   Diabetic polyneuropathy associated with type 2 diabetes mellitus ([place]) 04/18/2019   Anemia    Dyslipidemia 12/01/2018   Tachycardia 12/01/2018   HTN (hypertension) 10/01/2018   Fibroids    Fibroid, uterine    Obesity (BMI 30-39.9) 02/21/2013   Hyperlipidemia 03/13/2011   MORBID OBESITY 08/08/2008   Uncontrolled type 2 diabetes mellitus with complication, without long-term current use of insulin 03/31/2008   UNSPECIFIED ANEMIA 03/31/2008   GLYCOSURIA 03/29/2008   LIVER FUNCTION TESTS, ABNORMAL, HX OF 02/28/2007   ABDOMINAL PAIN 02/17/2007   ANXIETY 11/18/2006   GESTATIONAL DIABETES 11/18/2006   FATIGUE 11/18/2006   HEADACHE 11/18/2006   ABDOMINAL PAIN, EPIGASTRIC 11/18/2006   Past Medical History:  Diagnosis Date    Anemia    Anxiety    Depression    Diabetes mellitus    Hyperlipidemia    Hypertension    PCOS (polycystic ovarian syndrome)    Uterine fibroid    Vitamin B12 deficiency     Family History  Problem Relation Age of Onset   Diabetes Mother    Hypertension Mother    Hyperlipidemia Mother    Kidney disease Mother    Thyroid disease Mother    Anxiety disorder Mother    Diabetes Father    Hypertension Father    Anxiety disorder Father    Liver disease Father    Alcoholism Father    Drug abuse Father    Depression Father    Diabetes Brother    Migraines Other     Past Surgical History:  Procedure Laterality Date   CESAREAN SECTION     CHOLECYSTECTOMY     g1 p1     LAPAROSCOPIC GELPORT ASSISTED MYOMECTOMY  02/12/2015   Dr Barbie Banner   Social History   Occupational History   Occupation: Engineer, building services  Tobacco Use   Smoking status: Never   Smokeless tobacco: Never  Vaping Use   Vaping Use: Never used  Substance and Sexual Activity   Alcohol use: No   Drug use: No   Sexual activity: Yes    Partners: Male    Birth control/protection: Pill

## 2021-11-25 ENCOUNTER — Other Ambulatory Visit (HOSPITAL_COMMUNITY): Payer: Self-pay

## 2021-11-25 MED ORDER — EZETIMIBE 10 MG PO TABS
10.0000 mg | ORAL_TABLET | Freq: Every day | ORAL | 2 refills | Status: DC
  Filled 2021-11-25: qty 30, 30d supply, fill #0

## 2021-11-26 ENCOUNTER — Other Ambulatory Visit (HOSPITAL_COMMUNITY)
Admission: RE | Admit: 2021-11-26 | Discharge: 2021-11-26 | Disposition: A | Discharge status: Home / Self Care | Payer: 59 | Source: Ambulatory Visit | Attending: Radiology | Admitting: Radiology

## 2021-11-26 ENCOUNTER — Ambulatory Visit (INDEPENDENT_AMBULATORY_CARE_PROVIDER_SITE_OTHER): Payer: 59 | Admitting: Radiology

## 2021-11-26 ENCOUNTER — Encounter: Payer: Self-pay | Admitting: Family Medicine

## 2021-11-26 ENCOUNTER — Encounter: Payer: Self-pay | Admitting: Radiology

## 2021-11-26 VITALS — BP 116/82 | Ht 65.75 in | Wt 204.0 lb

## 2021-11-26 DIAGNOSIS — N951 Menopausal and female climacteric states: Secondary | ICD-10-CM

## 2021-11-26 DIAGNOSIS — Z01419 Encounter for gynecological examination (general) (routine) without abnormal findings: Secondary | ICD-10-CM | POA: Diagnosis present

## 2021-11-26 DIAGNOSIS — Z1211 Encounter for screening for malignant neoplasm of colon: Secondary | ICD-10-CM

## 2021-11-26 NOTE — Progress Notes (Signed)
   Cheryl Hart [birth year] [id number]   History:  46 y.o. G1P1 presents for annual exam Hx of D&C with ablation 2020 by Dr Matthew Saras C/O hot flashes and night sweats. Not sexually active x 2 years.  Gynecologic History Patient's last menstrual period was 05/22/2019.   Contraception/Family planning: post menopausal status Sexually active: no Last Pap: 2019. Results were: normal Last mammogram: never  Obstetric History OB History  Gravida Para Term Preterm AB Living  '1 1       1  '$ SAB IAB Ectopic Multiple Live Births               # Outcome Date GA Lbr Len/2nd Weight Sex Delivery Anes PTL Lv  1 Para              The following portions of the patient's history were reviewed and updated as appropriate: allergies, current medications, past family history, past medical history, past social history, past surgical history, and problem list.  Review of Systems Pertinent items noted in HPI and remainder of comprehensive ROS otherwise negative.   Past medical history, past surgical history, family history and social history were all reviewed and documented in the EPIC chart.   Exam:  Vitals:   11/26/21 1115  BP: 116/82  Weight: 204 lb (92.5 kg)  Height: 5' 5.75" (1.67 m)   Body mass index is 33.18 kg/m.  General appearance:  Normal Thyroid:  Symmetrical, normal in size, without palpable masses or nodularity. Respiratory  Auscultation:  Clear without wheezing or rhonchi Cardiovascular  Auscultation:  Regular rate, without rubs, murmurs or gallops  Edema/varicosities:  Not grossly evident Abdominal  Soft,nontender, without masses, guarding or rebound.  Liver/spleen:  No organomegaly noted  Hernia:  None appreciated  Skin  Inspection:  Grossly normal Breasts: Examined lying and sitting.   Right: Without masses, retractions, nipple discharge or axillary adenopathy.   Left: Without masses, retractions, nipple discharge or axillary adenopathy. Genitourinary   Inguinal/mons:   Normal without inguinal adenopathy  External genitalia:  Normal appearing vulva with no masses, tenderness, or lesions  BUS/Urethra/Skene's glands:  Normal without masses or exudate  Vagina:  Normal appearing with normal color and discharge, no lesions  Cervix:  Normal appearing without discharge or lesions  Uterus:  Normal in size, shape and contour.  Mobile, nontender  Adnexa/parametria:     Rt: Normal in size, without masses or tenderness.   Lt: Normal in size, without masses or tenderness.  Anus and perineum: Normal   Patient informed chaperone available to be present for breast and pelvic exam. Patient has requested no chaperone to be present. Patient has been advised what will be completed during breast and pelvic exam.   Assessment/Plan:   1. Well woman exam with routine gynecological exam - Schedule mammogram at [facility] - GI referral already sent by PCP for colonoscopy - Cytology - PAP( [place])  2. Vasomotor symptoms due to menopause Discussed options of [place] vs HRT with patch and micronized progesterone Will consult with her cardiologist at her upcoming appt IP:[id number] patch     Discussed SBE, colonoscopy and DEXA screening as directed/appropriate. Recommend 159mns of exercise weekly, including weight bearing exercise. Encouraged the use of seatbelts and sunscreen. Return in 1 year for annual or as needed.   CRubbie BattiestB WHNP-BC 11:48 AM 11/26/2021

## 2021-11-27 ENCOUNTER — Other Ambulatory Visit: Payer: Self-pay | Admitting: Family Medicine

## 2021-11-27 DIAGNOSIS — Z1231 Encounter for screening mammogram for malignant neoplasm of breast: Secondary | ICD-10-CM

## 2021-11-28 ENCOUNTER — Ambulatory Visit (HOSPITAL_COMMUNITY): Payer: 59 | Attending: Family Medicine

## 2021-11-28 ENCOUNTER — Encounter: Payer: Self-pay | Admitting: Family Medicine

## 2021-11-28 ENCOUNTER — Other Ambulatory Visit (HOSPITAL_COMMUNITY): Payer: Self-pay

## 2021-11-28 DIAGNOSIS — I10 Essential (primary) hypertension: Secondary | ICD-10-CM | POA: Diagnosis not present

## 2021-11-28 DIAGNOSIS — R002 Palpitations: Secondary | ICD-10-CM | POA: Diagnosis not present

## 2021-11-28 LAB — ECHOCARDIOGRAM COMPLETE
Area-P 1/2: 5.38 cm2
S' Lateral: 2.9 cm

## 2021-11-28 MED ORDER — PERFLUTREN LIPID MICROSPHERE
1.0000 mL | INTRAVENOUS | Status: AC | PRN
  Administered 2021-11-28: 1 mL via INTRAVENOUS

## 2021-11-29 ENCOUNTER — Other Ambulatory Visit (HOSPITAL_COMMUNITY): Payer: Self-pay

## 2021-11-29 ENCOUNTER — Other Ambulatory Visit: Payer: Self-pay | Admitting: Radiology

## 2021-11-29 DIAGNOSIS — Z79890 Hormone replacement therapy (postmenopausal): Secondary | ICD-10-CM

## 2021-11-29 MED ORDER — PROGESTERONE MICRONIZED 100 MG PO CAPS
100.0000 mg | ORAL_CAPSULE | Freq: Every day | ORAL | 0 refills | Status: DC
  Filled 2021-11-29: qty 30, 30d supply, fill #0
  Filled 2021-12-17: qty 30, 30d supply, fill #1
  Filled 2022-01-28: qty 30, 30d supply, fill #2

## 2021-11-29 MED ORDER — ESTRADIOL 0.05 MG/24HR TD PTTW
1.0000 | MEDICATED_PATCH | TRANSDERMAL | 0 refills | Status: DC
  Filled 2021-11-29: qty 8, 28d supply, fill #0
  Filled 2021-12-20: qty 8, 28d supply, fill #1
  Filled 2022-01-28: qty 8, 28d supply, fill #2

## 2021-12-02 ENCOUNTER — Ambulatory Visit: Payer: 59 | Attending: Internal Medicine | Admitting: Internal Medicine

## 2021-12-02 LAB — CYTOLOGY - PAP
Comment: NEGATIVE
Diagnosis: NEGATIVE
High risk HPV: NEGATIVE

## 2021-12-02 NOTE — Progress Notes (Deleted)
Cardiology Office Note:    Date:  12/02/2021   ID:  Cheryl Hart, DOB October 10, 1975, MRN [id number]  PCP:  Carollee Herter, [place] Providers Cardiologist:  None { Click to update primary MD,subspecialty MD or APP then REFRESH:1}    Referring MD: Carollee Herter, Alferd Apa, *   No chief complaint on file. ***  History of Present Illness:    Cheryl Hart is a 46 y.o. female with a hx of HTN, referral from Dr. Etter Sjogren for palpitations. ECG 10/30 shows NSR. Prior nl TSH. Echo 11/16 showed normal EF. Nl RV and no valve dx. Cardiac monitor was ordered  Past Medical History:  Diagnosis Date   Anemia    Anxiety    Depression    Diabetes mellitus    Hyperlipidemia    Hypertension    PCOS (polycystic ovarian syndrome)    Uterine fibroid    Vitamin B12 deficiency     Past Surgical History:  Procedure Laterality Date   CESAREAN SECTION     CHOLECYSTECTOMY     g1 p1     LAPAROSCOPIC GELPORT ASSISTED MYOMECTOMY  02/12/2015   Dr Barbie Banner    Current Medications: No outpatient medications have been marked as taking for the 12/02/21 encounter (Appointment) with Janina Mayo, MD.     Allergies:   Sulfa antibiotics and Sulfonamide derivatives   Social History   Socioeconomic History   Marital status: Single    Spouse name: Not on file   Number of children: Not on file   Years of education: Not on file   Highest education level: Not on file  Occupational History   Occupation: psych tech  Tobacco Use   Smoking status: Never    Passive exposure: Never   Smokeless tobacco: Never  Vaping Use   Vaping Use: Never used  Substance and Sexual Activity   Alcohol use: No   Drug use: No   Sexual activity: Not Currently    Partners: Male    Birth control/protection: Post-menopausal    Comment: menarche 46yo, sexual debut 46yo  Other Topics Concern   Not on file  Social History Narrative   Exercise-- no   Social Determinants of Health   Financial Resource  Strain: Not on file  Food Insecurity: Not on file  Transportation Needs: Not on file  Physical Activity: Not on file  Stress: Not on file  Social Connections: Not on file     Family History: The patient's ***family history includes Alcoholism in her father; Anxiety disorder in her father and mother; Depression in her father; Diabetes in her brother, father, and mother; Drug abuse in her father; Hyperlipidemia in her mother; Hypertension in her father and mother; Kidney disease in her mother; Liver disease in her father; Migraines in an other family member; Thyroid disease in her mother.  ROS:   Please see the history of present illness.    *** All other systems reviewed and are negative.  EKGs/Labs/Other Studies Reviewed:    The following studies were reviewed today: ***  EKG:  EKG is *** ordered today.  The ekg ordered today demonstrates ***  Recent Labs: 11/11/2021: ALT 8; BUN 10; Creatinine, Ser 0.49; Hemoglobin 10.1; Platelets 447.0; Potassium 4.1; Sodium 138; TSH 1.24  Recent Lipid Panel    Component Value Date/Time   CHOL 205 (H) 11/11/2021 0926   CHOL 161 02/09/2019 1208   TRIG 92.0 11/11/2021 0926   HDL 45.00 11/11/2021 0926   HDL 41  02/09/2019 1208   CHOLHDL 5 11/11/2021 0926   VLDL 18.4 11/11/2021 0926   LDLCALC 141 (H) 11/11/2021 0926   LDLCALC 95 02/09/2019 1208   LDLDIRECT 92.0 02/01/2015 1341     Risk Assessment/Calculations:   {Does this patient have ATRIAL FIBRILLATION?:[phone]}  No BP recorded.  {Refresh Note OR Click here to enter BP  :1}***         Physical Exam:    VS:  LMP 05/22/2019     Wt Readings from Last 3 Encounters:  11/26/21 204 lb (92.5 kg)  11/11/21 202 lb 3.2 oz (91.7 kg)  05/13/21 190 lb 12.8 oz (86.5 kg)     GEN: *** Well nourished, well developed in no acute distress HEENT: Normal NECK: No JVD; No carotid bruits LYMPHATICS: No lymphadenopathy CARDIAC: ***RRR, no murmurs, rubs, gallops RESPIRATORY:  Clear to  auscultation without rales, wheezing or rhonchi  ABDOMEN: Soft, non-tender, non-distended MUSCULOSKELETAL:  No edema; No deformity  SKIN: Warm and dry NEUROLOGIC:  Alert and oriented x 3 PSYCHIATRIC:  Normal affect   ASSESSMENT:    No diagnosis found. PLAN:    In order of problems listed above:  ***      {Are you ordering a CV Procedure (e.g. stress test, cath, DCCV, TEE, etc)?   Press F2        :[id number]}    Medication Adjustments/Labs and Tests Ordered: Current medicines are reviewed at length with the patient today.  Concerns regarding medicines are outlined above.  No orders of the defined types were placed in this encounter.  No orders of the defined types were placed in this encounter.   There are no Patient Instructions on file for this visit.   Signed, Janina Mayo, MD  12/02/2021 7:35 AM    [place]

## 2021-12-04 ENCOUNTER — Other Ambulatory Visit (HOSPITAL_COMMUNITY): Payer: Self-pay

## 2021-12-13 ENCOUNTER — Other Ambulatory Visit (HOSPITAL_COMMUNITY): Payer: Self-pay

## 2021-12-13 ENCOUNTER — Encounter: Payer: Self-pay | Admitting: Gastroenterology

## 2021-12-17 ENCOUNTER — Other Ambulatory Visit: Payer: Self-pay | Admitting: Family Medicine

## 2021-12-17 ENCOUNTER — Other Ambulatory Visit (HOSPITAL_COMMUNITY): Payer: Self-pay

## 2021-12-17 DIAGNOSIS — E1142 Type 2 diabetes mellitus with diabetic polyneuropathy: Secondary | ICD-10-CM

## 2021-12-17 DIAGNOSIS — F339 Major depressive disorder, recurrent, unspecified: Secondary | ICD-10-CM

## 2021-12-18 ENCOUNTER — Other Ambulatory Visit (HOSPITAL_COMMUNITY): Payer: Self-pay

## 2021-12-18 MED ORDER — DULOXETINE HCL 60 MG PO CPEP
60.0000 mg | ORAL_CAPSULE | Freq: Every day | ORAL | 0 refills | Status: DC
  Filled 2021-12-18: qty 90, 90d supply, fill #0
  Filled 2022-01-14: qty 30, 30d supply, fill #0
  Filled 2022-02-06: qty 30, 30d supply, fill #1
  Filled 2022-03-11: qty 30, 30d supply, fill #2

## 2021-12-20 ENCOUNTER — Other Ambulatory Visit (HOSPITAL_COMMUNITY): Payer: Self-pay

## 2021-12-22 ENCOUNTER — Other Ambulatory Visit: Payer: Self-pay | Admitting: Family Medicine

## 2021-12-22 DIAGNOSIS — F411 Generalized anxiety disorder: Secondary | ICD-10-CM

## 2021-12-23 ENCOUNTER — Encounter: Payer: Self-pay | Admitting: Orthopedic Surgery

## 2021-12-23 ENCOUNTER — Other Ambulatory Visit: Payer: Self-pay

## 2021-12-23 ENCOUNTER — Other Ambulatory Visit (HOSPITAL_COMMUNITY): Payer: Self-pay

## 2021-12-23 ENCOUNTER — Ambulatory Visit: Payer: 59 | Admitting: Orthopedic Surgery

## 2021-12-23 DIAGNOSIS — M1711 Unilateral primary osteoarthritis, right knee: Secondary | ICD-10-CM | POA: Diagnosis not present

## 2021-12-23 MED ORDER — ALPRAZOLAM 0.25 MG PO TABS
0.2500 mg | ORAL_TABLET | Freq: Three times a day (TID) | ORAL | 1 refills | Status: DC | PRN
  Filled 2021-12-23: qty 30, 10d supply, fill #0
  Filled 2022-01-14: qty 30, 10d supply, fill #1

## 2021-12-23 NOTE — Telephone Encounter (Signed)
Requesting: alprazolam 0.'25mg'$   Contract: 05/13/21 UDS: 04/18/19 Last Visit: 11/11/21 Next Visit: None Last Refill: 11/11/21 #30 and 0RF   Please Advise

## 2021-12-23 NOTE — Progress Notes (Signed)
Office Visit Note   Patient: Cheryl Hart           Date of Birth: [birth year]           MRN: [id number] Visit Date: 12/23/2021              Requested by: [name], MD [place],  [place] [address] PCP: Carollee Herter, Alferd Apa, DO  Chief Complaint  Patient presents with   Right Knee - Pain      HPI: Patient is a 46 year old woman who presents with osteoarthritis right knee.  Patient has undergone prolonged conservative therapy including steroid injections most recently a hyaluronic injection that has not worked.  Patient has pain in the patellofemoral joint as well as medial and lateral joint line.  Patient has no mechanical locking symptoms.  Patient is undergoing formalized physical therapy as well.  Assessment & Plan: Visit Diagnoses:  1. Primary osteoarthritis of right knee     Plan: With failure of conservative therapy discussed that a total knee arthroplasty is an option.  Risks and benefits were discussed including infection neurovascular injury persistent pain need for additional surgery.  Discussed the need for a revision of the polyethylene liner as the total knee undergoes normal wear and tear.  Patient states she understands she will discuss surgical planning with Malachy Mood and we will schedule according to her schedule.  Follow-Up Instructions: No follow-ups on file.   Ortho Exam  Patient is alert, oriented, no adenopathy, well-dressed, normal affect, normal respiratory effort. Examination patient has an antalgic gait.  She has crepitation with range of motion of the right knee collaterals and cruciates are stable.  There is no effusion.  She is tender to palpation the patellofemoral joint as well as medial and lateral joint line.  Radiograph shows joint space narrowing with subcondylar cysts worse in the lateral joint line and osteophytic bone spurs worse in the patellofemoral joint.  There is no varus or valgus malalignment.  Imaging: No results  found. No images are attached to the encounter.  Labs: Lab Results  Component Value Date   HGBA1C 6.5 11/11/2021   HGBA1C 6.2 05/13/2021   HGBA1C 6.5 08/14/2020   REPTSTATUS 12/23/2016 FINAL 12/21/2016   CULT >=100,000 COLONIES/mL ESCHERICHIA COLI (A) 12/21/2016   LABORGA ESCHERICHIA COLI (A) 12/21/2016     Lab Results  Component Value Date   ALBUMIN 4.1 11/11/2021   ALBUMIN 4.3 05/13/2021   ALBUMIN 4.0 08/14/2020    No results found for: "MG" Lab Results  Component Value Date   VD25OH 27.47 (L) 11/11/2021   VD25OH 18.1 (L) 02/09/2019    No results found for: "PREALBUMIN"    Latest Ref Rng & Units 11/11/2021    9:26 AM 05/13/2021    2:25 PM 08/14/2020    1:45 PM  CBC EXTENDED  WBC 4.0 - 10.5 K/uL 5.6  6.7  5.6   RBC 3.87 - 5.11 Mil/uL 4.02  4.11  4.11   Hemoglobin 12.0 - 15.0 g/dL 10.1  10.7  10.0   HCT 36.0 - 46.0 % 31.1  33.0  31.3   Platelets 150.0 - 400.0 K/uL 447.0  468.0  382.0   NEUT# 1.4 - 7.7 K/uL 2.8  4.3  3.3   Lymph# 0.7 - 4.0 K/uL 2.1  1.9  1.8      There is no height or weight on file to calculate BMI.  Orders:  No orders of the defined types were placed in this encounter.  No orders of the defined types were placed in this encounter.    Procedures: No procedures performed  Clinical Data: No additional findings.  ROS:  All other systems negative, except as noted in the HPI. Review of Systems  Objective: Vital Signs: LMP 05/22/2019   Specialty Comments:  No specialty comments available.  PMFS History: Patient Active Problem List   Diagnosis Date Noted   Palpitations 11/11/2021   Essential hypertension 11/11/2021   Depression 09/21/2020   PCOS (polycystic ovarian syndrome) 09/21/2020   Vitamin B12 deficiency 09/21/2020   Type 2 diabetes mellitus without complication, without long-term current use of insulin ([place]) 08/16/2020   Preventative health care 08/14/2020   Type 2 diabetes mellitus with hyperglycemia, without long-term  current use of insulin ([place]) 04/13/2020   Toe pain, bilateral 04/13/2020   Generalized anxiety disorder 04/18/2019   Diabetic polyneuropathy associated with type 2 diabetes mellitus ([place]) 04/18/2019   Anemia    Dyslipidemia 12/01/2018   Tachycardia 12/01/2018   HTN (hypertension) 10/01/2018   Fibroids    Fibroid, uterine    Obesity (BMI 30-39.9) 02/21/2013   Hyperlipidemia 03/13/2011   MORBID OBESITY 08/08/2008   Uncontrolled type 2 diabetes mellitus with complication, without long-term current use of insulin 03/31/2008   UNSPECIFIED ANEMIA 03/31/2008   GLYCOSURIA 03/29/2008   LIVER FUNCTION TESTS, ABNORMAL, HX OF 02/28/2007   ABDOMINAL PAIN 02/17/2007   ANXIETY 11/18/2006   GESTATIONAL DIABETES 11/18/2006   FATIGUE 11/18/2006   HEADACHE 11/18/2006   ABDOMINAL PAIN, EPIGASTRIC 11/18/2006   Past Medical History:  Diagnosis Date   Anemia    Anxiety    Depression    Diabetes mellitus    Hyperlipidemia    Hypertension    PCOS (polycystic ovarian syndrome)    Uterine fibroid    Vitamin B12 deficiency     Family History  Problem Relation Age of Onset   Diabetes Mother    Hypertension Mother    Hyperlipidemia Mother    Kidney disease Mother    Thyroid disease Mother    Anxiety disorder Mother    Diabetes Father    Hypertension Father    Anxiety disorder Father    Liver disease Father    Alcoholism Father    Drug abuse Father    Depression Father    Diabetes Brother    Migraines Other     Past Surgical History:  Procedure Laterality Date   CESAREAN SECTION     CHOLECYSTECTOMY     g1 p1     LAPAROSCOPIC GELPORT ASSISTED MYOMECTOMY  02/12/2015   Dr Barbie Banner   Social History   Occupational History   Occupation: Engineer, building services  Tobacco Use   Smoking status: Never    Passive exposure: Never   Smokeless tobacco: Never  Vaping Use   Vaping Use: Never used  Substance and Sexual Activity   Alcohol use: No   Drug use: No   Sexual activity: Not Currently    Partners:  Male    Birth control/protection: Post-menopausal    Comment: menarche 46yo, sexual debut 46yo

## 2021-12-31 ENCOUNTER — Ambulatory Visit: Payer: 59 | Attending: Internal Medicine | Admitting: Internal Medicine

## 2021-12-31 DIAGNOSIS — Z1231 Encounter for screening mammogram for malignant neoplasm of breast: Secondary | ICD-10-CM

## 2021-12-31 NOTE — Progress Notes (Deleted)
Cardiology Office Note:    Date:  12/31/2021   ID:  Cheryl Hart, DOB 02/16/1975, MRN [id number]  PCP:  Carollee Herter, [place] Providers Cardiologist:  None { Click to update primary MD,subspecialty MD or APP then REFRESH:1}    Referring MD: Carollee Herter, Alferd Apa, *   No chief complaint on file. ***  History of Present Illness:    Cheryl Hart is a 46 y.o. female with a hx of HTN, referral from Dr. Etter Sjogren for palpitations. ECG 10/30 shows NSR. Prior nl TSH. Echo 11/16 showed normal EF. Nl RV and no valve dx. Cardiac monitor 12/28/2021, showed normal sinus rhythm. No significant arrhythmias. She had an echo 11/28/2021 that was normal.  Past Medical History:  Diagnosis Date   Anemia    Anxiety    Depression    Diabetes mellitus    Hyperlipidemia    Hypertension    PCOS (polycystic ovarian syndrome)    Uterine fibroid    Vitamin B12 deficiency     Past Surgical History:  Procedure Laterality Date   CESAREAN SECTION     CHOLECYSTECTOMY     g1 p1     LAPAROSCOPIC GELPORT ASSISTED MYOMECTOMY  02/12/2015   Dr Barbie Banner    Current Medications: No outpatient medications have been marked as taking for the 12/31/21 encounter (Appointment) with Janina Mayo, MD.     Allergies:   Sulfa antibiotics and Sulfonamide derivatives   Social History   Socioeconomic History   Marital status: Single    Spouse name: Not on file   Number of children: Not on file   Years of education: Not on file   Highest education level: Not on file  Occupational History   Occupation: psych tech  Tobacco Use   Smoking status: Never    Passive exposure: Never   Smokeless tobacco: Never  Vaping Use   Vaping Use: Never used  Substance and Sexual Activity   Alcohol use: No   Drug use: No   Sexual activity: Not Currently    Partners: Male    Birth control/protection: Post-menopausal    Comment: menarche 46yo, sexual debut 46yo  Other Topics Concern   Not on file   Social History Narrative   Exercise-- no   Social Determinants of Health   Financial Resource Strain: Not on file  Food Insecurity: Not on file  Transportation Needs: Not on file  Physical Activity: Not on file  Stress: Not on file  Social Connections: Not on file     Family History: The patient's ***family history includes Alcoholism in her father; Anxiety disorder in her father and mother; Depression in her father; Diabetes in her brother, father, and mother; Drug abuse in her father; Hyperlipidemia in her mother; Hypertension in her father and mother; Kidney disease in her mother; Liver disease in her father; Migraines in an other family member; Thyroid disease in her mother.  ROS:   Please see the history of present illness.    *** All other systems reviewed and are negative.  EKGs/Labs/Other Studies Reviewed:    The following studies were reviewed today: ***  EKG:  EKG is *** ordered today.  The ekg ordered today demonstrates ***  Recent Labs: 11/11/2021: ALT 8; BUN 10; Creatinine, Ser 0.49; Hemoglobin 10.1; Platelets 447.0; Potassium 4.1; Sodium 138; TSH 1.24  Recent Lipid Panel    Component Value Date/Time   CHOL 205 (H) 11/11/2021 0926   CHOL 161 02/09/2019 1208  TRIG 92.0 11/11/2021 0926   HDL 45.00 11/11/2021 0926   HDL 41 02/09/2019 1208   CHOLHDL 5 11/11/2021 0926   VLDL 18.4 11/11/2021 0926   LDLCALC 141 (H) 11/11/2021 0926   LDLCALC 95 02/09/2019 1208   LDLDIRECT 92.0 02/01/2015 1341     Risk Assessment/Calculations:   {Does this patient have ATRIAL FIBRILLATION?[phone]}  No BP recorded.  {Refresh Note OR Click here to enter BP  :1}***         Physical Exam:    VS:  LMP 05/22/2019     Wt Readings from Last 3 Encounters:  11/26/21 204 lb (92.5 kg)  11/11/21 202 lb 3.2 oz (91.7 kg)  05/13/21 190 lb 12.8 oz (86.5 kg)     GEN: *** Well nourished, well developed in no acute distress HEENT: Normal NECK: No JVD; No carotid  bruits LYMPHATICS: No lymphadenopathy CARDIAC: ***RRR, no murmurs, rubs, gallops RESPIRATORY:  Clear to auscultation without rales, wheezing or rhonchi  ABDOMEN: Soft, non-tender, non-distended MUSCULOSKELETAL:  No edema; No deformity  SKIN: Warm and dry NEUROLOGIC:  Alert and oriented x 3 PSYCHIATRIC:  Normal affect   ASSESSMENT:    Palpitations: She does not have high risk features including syncope c/f arrhythmia , family hx of SCD, or abnormalities on her EKGs. Cardiac monitor was normal  PLAN:    In order of problems listed above:  Follow up PRN      {Are you ordering a CV Procedure (e.g. stress test, cath, DCCV, TEE, etc)?   Press F2        :[id number]}    Medication Adjustments/Labs and Tests Ordered: Current medicines are reviewed at length with the patient today.  Concerns regarding medicines are outlined above.  No orders of the defined types were placed in this encounter.  No orders of the defined types were placed in this encounter.   There are no Patient Instructions on file for this visit.   Signed, Janina Mayo, MD  12/31/2021 7:58 AM    [place]

## 2022-01-14 ENCOUNTER — Other Ambulatory Visit: Payer: Self-pay

## 2022-01-14 ENCOUNTER — Ambulatory Visit: Payer: 59

## 2022-01-14 ENCOUNTER — Other Ambulatory Visit (HOSPITAL_COMMUNITY): Payer: Self-pay

## 2022-01-17 ENCOUNTER — Other Ambulatory Visit (HOSPITAL_COMMUNITY): Payer: Self-pay

## 2022-01-17 ENCOUNTER — Ambulatory Visit (AMBULATORY_SURGERY_CENTER): Payer: 59 | Admitting: *Deleted

## 2022-01-17 VITALS — Ht 66.5 in | Wt 192.0 lb

## 2022-01-17 DIAGNOSIS — Z1211 Encounter for screening for malignant neoplasm of colon: Secondary | ICD-10-CM

## 2022-01-17 MED ORDER — NA SULFATE-K SULFATE-MG SULF 17.5-3.13-1.6 GM/177ML PO SOLN
1.0000 | Freq: Once | ORAL | 0 refills | Status: AC
  Filled 2022-01-17: qty 354, 1d supply, fill #0

## 2022-01-17 NOTE — Progress Notes (Signed)

## 2022-01-28 ENCOUNTER — Other Ambulatory Visit: Payer: Self-pay

## 2022-02-04 ENCOUNTER — Encounter: Payer: 59 | Admitting: Gastroenterology

## 2022-02-06 ENCOUNTER — Other Ambulatory Visit (HOSPITAL_COMMUNITY): Payer: Self-pay

## 2022-02-06 ENCOUNTER — Other Ambulatory Visit: Payer: Self-pay | Admitting: Family Medicine

## 2022-02-06 DIAGNOSIS — F411 Generalized anxiety disorder: Secondary | ICD-10-CM

## 2022-02-06 MED ORDER — ALPRAZOLAM 0.25 MG PO TABS
0.2500 mg | ORAL_TABLET | Freq: Three times a day (TID) | ORAL | 1 refills | Status: DC | PRN
  Filled 2022-02-06: qty 30, 10d supply, fill #0
  Filled 2022-02-24: qty 30, 10d supply, fill #1

## 2022-02-06 NOTE — Telephone Encounter (Signed)
Requesting: Xanax Contract: 05/13/2021 UDS: N/A Last Visit: 11/11/2021 Next Visit: N/A Last Refill: 12/23/2021  Please Advise

## 2022-02-07 ENCOUNTER — Other Ambulatory Visit (HOSPITAL_COMMUNITY): Payer: Self-pay

## 2022-02-07 ENCOUNTER — Other Ambulatory Visit: Payer: Self-pay

## 2022-02-17 ENCOUNTER — Other Ambulatory Visit (HOSPITAL_COMMUNITY): Payer: Self-pay

## 2022-02-17 ENCOUNTER — Other Ambulatory Visit: Payer: Self-pay | Admitting: Family Medicine

## 2022-02-17 ENCOUNTER — Other Ambulatory Visit: Payer: Self-pay

## 2022-02-17 MED ORDER — VITAMIN D (ERGOCALCIFEROL) 1.25 MG (50000 UNIT) PO CAPS
50000.0000 [IU] | ORAL_CAPSULE | ORAL | 1 refills | Status: DC
  Filled 2022-02-17: qty 4, 28d supply, fill #0
  Filled 2022-03-20: qty 4, 28d supply, fill #1
  Filled 2022-04-15: qty 4, 28d supply, fill #2
  Filled 2022-05-19: qty 4, 28d supply, fill #3
  Filled 2022-06-10: qty 4, 28d supply, fill #4
  Filled 2022-07-21: qty 4, 28d supply, fill #5

## 2022-02-17 NOTE — Progress Notes (Deleted)
Name: Cheryl Hart  Age/ Sex: 47 y.o., female   MRN/ DOB: [id number], 10/16/1975     PCP: Ann Held, DO   Reason for Endocrinology Evaluation: Type 2 Diabetes Mellitus  Initial Endocrine Consultative Visit: 12/01/2018    PATIENT IDENTIFIER: Cheryl Hart is a 47 y.o. female with a past medical history of HTN, T2DM, and dyslipidemia  . The patient has followed with Endocrinology clinic since 12/01/2018 for consultative assistance with management of her diabetes.  DIABETIC HISTORY:  Cheryl Hart was diagnosed with DM in 2010. She has tried invokana but didn't;t feel good on it , has been unable to afford Januvia or victoza in the past.  Her hemoglobin A1c has ranged from 7.4% in 2018, peaking at 11.1% in 2019.   On her initial visit to our clinic she had an A1c of 9.0%  , she was on Glimepiride and metformin . 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  Today (02/17/2022): Cheryl Hart is here for a f/u on diabetes.  She has NOT been to our clinic in 18 months. She  Has been checking 4 times a week. The patient has not had hypoglycemic episodes since the last clinic visit.    Denies nausea or diarrhea  She is not compliant with rosuvastatin, she self reduced it to  three times a week but has started OTC fish oil    HOME DIABETES REGIMEN:  Glipizide 5 mg, 1.5 tablets before supper  Metformin 850 mg Twice daily  Trulicity 1.5 mg weekly (Monday)     Statin: Yes ACE-I/ARB: yes    METER DOWNLOAD SUMMARY: Did not bring    DIABETIC COMPLICATIONS: Microvascular complications:    Denies: CKD, retinopathy , neuropathy  Last eye exam: Completed 03/2020   Macrovascular complications:    Denies: CAD, PVD, CVA   HISTORY:  Past Medical History:   Past Medical History:  Diagnosis Date   Anemia    Anxiety    Arthritis    KNEE RIGHT   Blood transfusion without reported diagnosis    Depression    Diabetes mellitus    Hyperlipidemia    Hypertension    PT.DENIES,MEDS FOR KIDNEY AND PALPATIONS   PCOS (polycystic ovarian syndrome)    Uterine fibroid    Vitamin B12 deficiency    Past Surgical History:  Past Surgical History:  Procedure Laterality Date   CESAREAN SECTION     CHOLECYSTECTOMY     g1 p1     LAPAROSCOPIC GELPORT ASSISTED MYOMECTOMY  02/12/2015   Dr Barbie Banner   Social History:  reports that she has never smoked. She has never been exposed to tobacco smoke. She has never used smokeless tobacco. She reports that she does not drink alcohol and does not use drugs. Family History:  Family History  Problem Relation Age of Onset   Crohn's disease Mother    Diabetes Mother    Hypertension Mother    Hyperlipidemia Mother    Kidney disease Mother    Thyroid disease Mother    Anxiety disorder Mother    Diabetes Father    Hypertension Father    Anxiety disorder Father    Liver disease Father    Alcoholism Father    Drug abuse Father  Depression Father    Diabetes Brother    Migraines Other    Colon cancer Neg Hx    Colon polyps Neg Hx    Esophageal cancer Neg Hx    Rectal cancer Neg Hx    Stomach cancer Neg Hx    Ulcerative colitis Neg Hx      HOME MEDICATIONS: Allergies as of 02/18/2022       Reactions   Sulfa Antibiotics Other (See Comments), Rash   Sulfonamide Derivatives Photosensitivity, Rash, Other (See Comments)   Severe headache        Medication List        Accurate as of February 17, 2022 11:30 AM. If you have any questions, ask your nurse or doctor.          ALPRAZolam 0.25 MG tablet Commonly known as: XANAX Take 1 tablet (0.25 mg total) by mouth 3 (three) times daily as needed.   Biotin 5000 MCG Tabs Take 5,000 mcg by mouth daily.   Blood Glucose Monitor System w/Device Kit use  as drected to check blood sugar   diphenhydrAMINE 25 MG tablet Commonly known as: BENADRYL Take 50 mg by mouth at bedtime as needed for sleep.   DULoxetine 60 MG capsule Commonly known as: CYMBALTA Take 1 capsule (60 mg total) by mouth daily.   estradiol 0.05 MG/24HR patch Commonly known as: VIVELLE-DOT Place 1 patch (0.05 mg total) onto the skin 2 (two) times a week.   ezetimibe 10 MG tablet Commonly known as: Zetia Take 1 tablet (10 mg total) by mouth daily.   FREESTYLE LITE test strip Generic drug: glucose blood Use as instructed to check blood sugar 2 times daily   glipiZIDE 5 MG tablet Commonly known as: GLUCOTROL Take 1.5 tablets (7.5 mg total) by mouth 2 (two) times daily before a meal.   ibuprofen 600 MG tablet Commonly known as: ADVIL Take 1 tablet (600 mg total) by mouth every 6 (six) hours as needed.   losartan 25 MG tablet Commonly known as: COZAAR Take 1 tablet (25 mg total) by mouth daily.   metFORMIN 850 MG tablet Commonly known as: GLUCOPHAGE Take 1 tablet (850 mg total) by mouth 2 (two) times daily with a meal.   metoprolol succinate 50 MG 24 hr tablet Commonly known as: TOPROL-XL Take 1 tablet (50 mg total) by mouth daily. Take with or immediately following a meal.   multivitamin with minerals Tabs tablet Take 1 tablet by mouth daily.   nystatin powder Commonly known as: Nyamyc Apply 1 application topically 3 (three) times daily. What changed:  when to take this reasons to take this   progesterone 100 MG capsule Commonly known as: PROMETRIUM Take 1 capsule (100 mg total) by mouth daily.   rosuvastatin 40 MG tablet Commonly known as: CRESTOR Take 1 tablet (40 mg total) by mouth daily.   Trulicity 1.5 0000000 Sopn Generic drug: Dulaglutide Inject 1.5 mg into the skin once a week.   Vitamin D (Ergocalciferol) 1.25 MG (50000 UNIT) Caps capsule Commonly known as: DRISDOL Take 1 capsule (50,000 Units total) by mouth every 7 (seven)  days.         OBJECTIVE:   Vital Signs: There were no vitals taken for this visit.  Wt Readings from Last 3 Encounters:  01/17/22 192 lb (87.1 kg)  11/26/21 204 lb (92.5 kg)  11/11/21 202 lb 3.2 oz (91.7 kg)     Exam: General: Pt appears well and is in NAD  Lungs: Clear with  good BS bilat with no rales, rhonchi, or wheezes  Heart: RRR with normal S1 and S2 and no gallops; no murmurs; no rub  Abdomen: Normoactive bowel sounds, soft, nontender, without masses or organomegaly palpable  Extremities: No pretibial edema.   Neuro: MS is good with appropriate affect, pt is alert and Ox3    DM foot exam:04/13/2020   The skin of the feet is without sores or ulcerations, Right great toe nail is thickened and curved . Left great toe nail is discolored and brittle.  The pedal pulses are 2+ on right and 2+ on left. The sensation is intact to a screening 5.07, 10 gram monofilament bilaterally    DATA REVIEWED:  Lab Results  Component Value Date   HGBA1C 6.5 11/11/2021   HGBA1C 6.2 05/13/2021   HGBA1C 6.5 08/14/2020    Latest Reference Range & Units 11/11/21 09:26  Sodium 135 - 145 mEq/L 138  Potassium 3.5 - 5.1 mEq/L 4.1  Chloride 96 - 112 mEq/L 104  CO2 19 - 32 mEq/L 25  Glucose 70 - 99 mg/dL 102 (H)  BUN 6 - 23 mg/dL 10  Creatinine 0.40 - 1.20 mg/dL 0.49  Calcium 8.4 - 10.5 mg/dL 10.0  Alkaline Phosphatase 39 - 117 U/L 100  Albumin 3.5 - 5.2 g/dL 4.1  AST 0 - 37 U/L 9  ALT 0 - 35 U/L 8  Total Protein 6.0 - 8.3 g/dL 7.3  Total Bilirubin 0.2 - 1.2 mg/dL 0.2  GFR >60.00 mL/min 111.36  Total CHOL/HDL Ratio  5  Cholesterol 0 - 200 mg/dL 205 (H)  HDL Cholesterol >39.00 mg/dL 45.00  LDL (calc) 0 - 99 mg/dL 141 (H)  MICROALB/CREAT RATIO 0.0 - 30.0 mg/g 1.4  NonHDL  159.85  Triglycerides 0.0 - 149.0 mg/dL 92.0  VLDL 0.0 - 40.0 mg/dL 18.4  (H): Data is abnormally high   ASSESSMENT / PLAN / RECOMMENDATIONS:    1) Type 2 Diabetes Mellitus, Optimally controlled,  Without  complications - Most recent A1c of 6.5 %. Goal A1c < 7.0 %.    - I have praised the pt on weight loss and optimizing glucose control  - I have encouraged her to continue checking glucose a few times a week   MEDICATIONS: - Continue Glipizide '5mg'$ , one and a  Half tablet before Breakfast and one and a half a tablet before Supper - Continue Metformin 850 mg Twice daily  - Continue  Trulicity 1.5 mg weekly    EDUCATION / INSTRUCTIONS: BG monitoring instructions: Patient is instructed to check her blood sugars 3 times a week Call [facility] if: BG persistently < 70  I reviewed the Rule of 15 for the treatment of hypoglycemia in detail with the patient. Literature supplied.   2) Diabetic complications:  Eye: Does not have known diabetic retinopathy. Pt urged to have an updated eye exam  Neuro/ Feet: Does not have known diabetic peripheral neuropathy. Renal: Patient does not have known baseline CKD. She is on an ACEI/ARB at present.   3) Dyslipidemia :   - Her LDL has increased to 124 mg/dL, we discussed this is above goal. She has self reduced Rosuvastatin to 3x a week because she started OTC fish oil. We discussed the cardiovascular benefits of statins , she was advised she can take statins and fish oil together.   - Will refill    Medication    Rosuvastatin 20 mg daily    F/U in 6 months    Signed electronically  by: Mack Guise, MD  [facility] Endocrinology  [facility] [address], [place] [address] Phone: [phone] FAX: [phone]   CC: [address] [place] [address] Phone: [phone]  Fax: [phone]  Return to Endocrinology clinic as below: Future Appointments  Date Time Provider [place]  02/18/2022  7:30 AM Charlie Char, Melanie Crazier, MD [facility] None  02/20/2022  1:00 PM [facility] PAT 2 [facility] None  03/10/2022  9:00 AM [name], MD [facility] None  04/04/2022  8:30 AM Danis, Kirke Corin, MD [facility]

## 2022-02-18 ENCOUNTER — Encounter: Payer: Self-pay | Admitting: Internal Medicine

## 2022-02-18 ENCOUNTER — Ambulatory Visit: Payer: 59 | Admitting: Internal Medicine

## 2022-02-20 ENCOUNTER — Other Ambulatory Visit: Payer: Self-pay

## 2022-02-20 ENCOUNTER — Encounter (HOSPITAL_COMMUNITY)
Admission: RE | Admit: 2022-02-20 | Discharge: 2022-02-20 | Disposition: A | Discharge status: Home / Self Care | Payer: 59 | Source: Ambulatory Visit | Attending: Orthopedic Surgery | Admitting: Orthopedic Surgery

## 2022-02-20 ENCOUNTER — Encounter (HOSPITAL_COMMUNITY): Payer: Self-pay

## 2022-02-20 VITALS — BP 106/71 | HR 95 | Temp 97.8°F | Resp 16 | Ht 66.5 in | Wt 204.0 lb

## 2022-02-20 DIAGNOSIS — Z01812 Encounter for preprocedural laboratory examination: Secondary | ICD-10-CM | POA: Insufficient documentation

## 2022-02-20 DIAGNOSIS — Z01818 Encounter for other preprocedural examination: Secondary | ICD-10-CM

## 2022-02-20 DIAGNOSIS — I10 Essential (primary) hypertension: Secondary | ICD-10-CM | POA: Diagnosis not present

## 2022-02-20 DIAGNOSIS — E119 Type 2 diabetes mellitus without complications: Secondary | ICD-10-CM | POA: Diagnosis not present

## 2022-02-20 HISTORY — DX: Personal history of urinary calculi: Z87.442

## 2022-02-20 LAB — BASIC METABOLIC PANEL
Anion gap: 11 (ref 5–15)
BUN: 6 mg/dL (ref 6–20)
CO2: 23 mmol/L (ref 22–32)
Calcium: 9.8 mg/dL (ref 8.9–10.3)
Chloride: 102 mmol/L (ref 98–111)
Creatinine, Ser: 0.64 mg/dL (ref 0.44–1.00)
GFR, Estimated: >60 mL/min (ref >60)
Glucose, Bld: 112 mg/dL — ABNORMAL HIGH (ref 70–99)
Potassium: 4.2 mmol/L (ref 3.5–5.1)
Sodium: 136 mmol/L (ref 135–145)

## 2022-02-20 LAB — SURGICAL PCR SCREEN
MRSA, PCR: NEGATIVE
Staphylococcus aureus: POSITIVE — AB

## 2022-02-20 LAB — CBC
HCT: 33.6 % — ABNORMAL LOW (ref 36.0–46.0)
Hemoglobin: 10.2 g/dL — ABNORMAL LOW (ref 12.0–15.0)
MCH: 24.3 pg — ABNORMAL LOW (ref 26.0–34.0)
MCHC: 30.4 g/dL (ref 30.0–36.0)
MCV: 80.2 fL (ref 80.0–100.0)
Platelets: 517 10*3/uL — ABNORMAL HIGH (ref 150–400)
RBC: 4.19 MIL/uL (ref 3.87–5.11)
RDW: 15.9 % — ABNORMAL HIGH (ref 11.5–15.5)
WBC: 8.1 10*3/uL (ref 4.0–10.5)
nRBC: 0 % (ref 0.0–0.2)

## 2022-02-20 LAB — GLUCOSE, CAPILLARY: Glucose-Capillary: 129 mg/dL — ABNORMAL HIGH (ref 70–99)

## 2022-02-20 LAB — HEMOGLOBIN A1C
Hgb A1c MFr Bld: 6.3 % — ABNORMAL HIGH (ref 4.8–5.6)
Mean Plasma Glucose: 134.11 mg/dL

## 2022-02-20 NOTE — Progress Notes (Addendum)
Surgical Instructions    Your procedure is scheduled on Friday February 16.  Report to [facility] Main Entrance "A" at 5:30 A.M., then check in with the Admitting office.  Call this number if you have problems the morning of surgery:  [phone]   If you have any questions prior to your surgery date call [phone]: Open Monday-Friday 8am-4pm If you experience any cold or flu symptoms such as cough, fever, chills, shortness of breath, etc. between now and your scheduled surgery, please notify us at the above number     Remember:  Do not eat after midnight the night before your surgery  You may drink clear liquids until 4:30am the morning of your surgery.   Clear liquids allowed are: Water, Non-Citrus Juices (without pulp), Carbonated Beverages, Clear Tea, Black Coffee ONLY (NO MILK, CREAM OR POWDERED CREAMER of any kind), and Gatorade  The day of surgery (if you have diabetes): Drink ONE (1) 12 oz G2 given to you in your pre admission testing appointment by 4:30am the morning of surgery. Drink in one sitting. Do not sip.  This drink was given to you during your hospital  pre-op appointment visit.  Nothing else to drink after completing the  12 oz bottle of G2.         If you have questions, please contact your surgeon's office.    Take these medicines the morning of surgery with A SIP OF WATER:  DULoxetine (CYMBALTA)  estradiol (VIVELLE-DOT)  ezetimibe (ZETIA)  metoprolol succinate (TOPROL-XL)  progesterone  rosuvastatin (CRESTOR)  IF NEEDED TAKE: ALPRAZolam (XANAX)   As of today, STOP taking any Aspirin (unless otherwise instructed by your surgeon) Aleve, Naproxen, Ibuprofen, Motrin, Advil, Goody's, BC's, all herbal medications, fish oil, and all vitamins.  WHAT DO I DO ABOUT MY DIABETES MEDICATION?  HOLD Dulaglutide (TRULICITY)  7 DAYS PRIOR TO SURGERY.  Do not take oral diabetes medications the morning of surgery. DO NOT take metFORMIN (GLUCOPHAGE) the morning of  your surgery. DO NOT take glipiZIDE (GLUCOTROL) the morning of surgery.   HOW TO MANAGE YOUR DIABETES BEFORE AND AFTER SURGERY  Why is it important to control my blood sugar before and after surgery? Improving blood sugar levels before and after surgery helps healing and can limit problems. A way of improving blood sugar control is eating a healthy diet by:  Eating less sugar and carbohydrates  Increasing activity/exercise  Talking with your doctor about reaching your blood sugar goals High blood sugars (greater than 180 mg/dL) can raise your risk of infections and slow your recovery, so you will need to focus on controlling your diabetes during the weeks before surgery. Make sure that the doctor who takes care of your diabetes knows about your planned surgery including the date and location.  How do I manage my blood sugar before surgery? Check your blood sugar at least 4 times a day, starting 2 days before surgery, to make sure that the level is not too high or low.  Check your blood sugar the morning of your surgery when you wake up and every 2 hours until you get to the Short Stay unit.  If your blood sugar is less than 70 mg/dL, you will need to treat for low blood sugar: Do not take insulin. Treat a low blood sugar (less than 70 mg/dL) with  cup of clear juice (cranberry or apple), 4 glucose tablets, OR glucose gel. Recheck blood sugar in 15 minutes after treatment (to make sure it is greater  than 70 mg/dL). If your blood sugar is not greater than 70 mg/dL on recheck, call [phone] for further instructions. Report your blood sugar to the short stay nurse when you get to Short Stay.  If you are admitted to the hospital after surgery: Your blood sugar will be checked by the staff and you will probably be given insulin after surgery (instead of oral diabetes medicines) to make sure you have good blood sugar levels. The goal for blood sugar control after surgery is 80-180 mg/dL.           Do not wear jewelry or makeup. Do not wear lotions, powders, perfumes/cologne or deodorant. Do not shave 48 hours prior to surgery.  Men may shave face and neck. Do not bring valuables to the hospital. Do not wear nail polish, gel polish, artificial nails, or any other type of covering on natural nails (fingers and toes) If you have artificial nails or gel coating that need to be removed by a nail salon, please have this removed prior to surgery. Artificial nails or gel coating may interfere with anesthesia's ability to adequately monitor your vital signs.  [facility] is not responsible for any belongings or valuables.    Do NOT Smoke (Tobacco/Vaping)  24 hours prior to your procedure  If you use a CPAP at night, you may bring your mask for your overnight stay.   Contacts, glasses, hearing aids, dentures or partials may not be worn into surgery, please bring cases for these belongings   For patients admitted to the hospital, discharge time will be determined by your treatment team.   Patients discharged the day of surgery will not be allowed to drive home, and someone needs to stay with them for 24 hours.   SURGICAL WAITING ROOM VISITATION Patients having surgery or a procedure may have no more than 2 support people in the waiting area - these visitors may rotate.   Children under the age of 32 must have an adult with them who is not the patient. If the patient needs to stay at the hospital during part of their recovery, the visitor guidelines for inpatient rooms apply. Pre-op nurse will coordinate an appropriate time for 1 support person to accompany patient in pre-op.  This support person may not rotate.   Please refer to [email or web address] for the visitor guidelines for Inpatients (after your surgery is over and you are in a regular room).    Special instructions:    Oral Hygiene is also important to reduce your risk of  infection.  Remember - BRUSH YOUR TEETH THE MORNING OF SURGERY WITH YOUR REGULAR TOOTHPASTE   [facility]- Preparing For Surgery  Before surgery, you can play an important role. Because skin is not sterile, your skin needs to be as free of germs as possible. You can reduce the number of germs on your skin by washing with CHG (chlorahexidine gluconate) Soap before surgery.  CHG is an antiseptic cleaner which kills germs and bonds with the skin to continue killing germs even after washing.     Please do not use if you have an allergy to CHG or antibacterial soaps. If your skin becomes reddened/irritated stop using the CHG.  Do not shave (including legs and underarms) for at least 48 hours prior to first CHG shower. It is OK to shave your face.  Please follow these instructions carefully.     Shower the NIGHT BEFORE SURGERY and the MORNING OF SURGERY with CHG Soap.   If  you chose to wash your hair, wash your hair first as usual with your normal shampoo. After you shampoo, rinse your hair and body thoroughly to remove the shampoo.  Then [employer] and genitals (private parts) with your normal soap and rinse thoroughly to remove soap.  After that Use CHG Soap as you would any other liquid soap. You can apply CHG directly to the skin and wash gently with a scrungie or a clean washcloth.   Apply the CHG Soap to your body ONLY FROM THE NECK DOWN.  Do not use on open wounds or open sores. Avoid contact with your eyes, ears, mouth and genitals (private parts). Wash Face and genitals (private parts)  with your normal soap.   Wash thoroughly, paying special attention to the area where your surgery will be performed.  Thoroughly rinse your body with warm water from the neck down.  DO NOT shower/wash with your normal soap after using and rinsing off the CHG Soap.  Pat yourself dry with a CLEAN TOWEL.  Wear CLEAN PAJAMAS to bed the night before surgery  Place CLEAN SHEETS on your bed the night before  your surgery  DO NOT SLEEP WITH PETS.   Day of Surgery:  Take a shower with CHG soap. Wear Clean/Comfortable clothing the morning of surgery Do not apply any deodorants/lotions.   Remember to brush your teeth WITH YOUR REGULAR TOOTHPASTE.    If you received a COVID test during your pre-op visit, it is requested that you wear a mask when out in public, stay away from anyone that may not be feeling well, and notify your surgeon if you develop symptoms. If you have been in contact with anyone that has tested positive in the last 10 days, please notify your surgeon.    Please read over the following fact sheets that you were given.

## 2022-02-20 NOTE — Progress Notes (Signed)
PCP - Garnet Koyanagi Chase,MD Cardiologist - denies  PPM/ICD - denies Device Orders -  Rep Notified -   Chest x-ray - na EKG - 11/11/21 Stress Test - denies ECHO - 11/28/21 Cardiac Cath - denies  Sleep Study - denies CPAP -   Fasting Blood Sugar - pt states she rarely checks her blood sugar so she doesn't know what her fasting sugar is.  Checks Blood Sugar _____ times a day  Last dose of GLP1 agonist- 02/19/22  GLP1 instructions: pt verbally agrees not to take Trulicity prior to surgery.  Blood Thinner Instructions:na Aspirin Instructions:na  ERAS Protcol -clear liquids until 0430 PRE-SURGERY Ensure or G2-G2   COVID TEST- na   Anesthesia review: yes -history of palpitations;had echocardiogram. Denies being seen by cardiologist.  Patient denies shortness of breath, fever, cough and chest pain at PAT appointment   All instructions explained to the patient, with a verbal understanding of the material. Patient agrees to go over the instructions while at home for a better understanding. Patient also instructed to self quarantine after being tested for COVID-19. The opportunity to ask questions was provided.

## 2022-02-21 NOTE — Anesthesia Preprocedure Evaluation (Addendum)
Anesthesia Evaluation  Patient identified by MRN, date of birth, ID band Patient awake    Reviewed: Allergy & Precautions, NPO status , Patient's Chart, lab work & pertinent test results, reviewed documented beta blocker date and time   Airway Mallampati: II  TM Distance: >3 FB Neck ROM: Full    Dental no notable dental hx. (+) Teeth Intact, Caps, Dental Advisory Given   Pulmonary    Pulmonary exam normal breath sounds clear to auscultation       Cardiovascular hypertension, Pt. on medications and Pt. on home beta blockers Normal cardiovascular exam Rhythm:Regular Rate:Normal  EKG 11/11/21 NSR, Normal   Neuro/Psych  Headaches PSYCHIATRIC DISORDERS Anxiety Depression    Diabetic polyneuropathy  Neuromuscular disease    GI/Hepatic negative GI ROS, Neg liver ROS,,,  Endo/Other  diabetes, Well Controlled, Type 2, Oral Hypoglycemic Agents  Obesity Hyperlipidemia GLP-1 RA therapy- last dose 2/7  Renal/GU negative Renal ROS  negative genitourinary   Musculoskeletal  (+) Arthritis , Osteoarthritis,  OA right knee   Abdominal  (+) + obese  Peds  Hematology  (+) Blood dyscrasia, anemia   Anesthesia Other Findings   Reproductive/Obstetrics                              Anesthesia Physical Anesthesia Plan  ASA: 2  Anesthesia Plan: Spinal   Post-op Pain Management: Minimal or no pain anticipated and Regional block*   Induction: Intravenous  PONV Risk Score and Plan: 3 and Treatment may vary due to age or medical condition, Propofol infusion and Ondansetron  Airway Management Planned: Natural Airway and Simple Face Mask  Additional Equipment:   Intra-op Plan:   Post-operative Plan: Extubation in OR  Informed Consent: I have reviewed the patients History and Physical, chart, labs and discussed the procedure including the risks, benefits and alternatives for the proposed anesthesia with the  patient or authorized representative who has indicated his/her understanding and acceptance.     Dental advisory given  Plan Discussed with: Anesthesiologist and CRNA  Anesthesia Plan Comments: (PAT note by Karoline Caldwell, PA-C: Patient recently evaluated by her PCP Dr. Roma Schanz for complaint of palpitations.  He also has history of hypertension and is maintained on metoprolol and losartan.  Echo and event monitor were ordered for evaluation of palpitations.  Echo 11/28/2021 showed normal LVEF, normal valves.  Event monitor 12/28/2021 showed NSR, no significant arrhythmias, no correlation between symptoms and rhythm  Patient on GLP-1 agonist Trulicity, last dose 0000000.  Non-insulin-dependent DM2, A1c 6.3 on preop labs.  Labs reviewed, mild anemia with hemoglobin 10.2, mild thrombocytosis with platelets 517k, otherwise unremarkable.  EKG 11/11/2021:  Event monitor 12/28/2021:   Normal sinus rhythm   No significant arrhythmis   No correlation between symptoms and rhythm   No actionable arrhythmia   OVERALL, normal study.  TTE 11/28/2021: 1. Left ventricular ejection fraction, by estimation, is 60 to 65%. The  left ventricle has normal function. The left ventricle has no regional  wall motion abnormalities. Indeterminate diastolic filling due to E-A  fusion.   2. Right ventricular systolic function is normal. The right ventricular  size is normal. Tricuspid regurgitation signal is inadequate for assessing  PA pressure.   3. The mitral valve is normal in structure. No evidence of mitral valve  regurgitation. No evidence of mitral stenosis.   4. The aortic valve is tricuspid. Aortic valve regurgitation is not  visualized. No aortic stenosis  is present.   5. The inferior vena cava is normal in size with greater than 50%  respiratory variability, suggesting right atrial pressure of 3 mmHg.    )         Anesthesia Quick Evaluation

## 2022-02-21 NOTE — Progress Notes (Signed)
Anesthesia Chart Review:  Patient recently evaluated by her PCP Dr. Roma Schanz for complaint of palpitations.  He also has history of hypertension and is maintained on metoprolol and losartan.  Echo and event monitor were ordered for evaluation of palpitations.  Echo 11/28/2021 showed normal LVEF, normal valves.  Event monitor 12/28/2021 showed NSR, no significant arrhythmias, no correlation between symptoms and rhythm  Patient on GLP-1 agonist Trulicity, last dose 0000000.  Non-insulin-dependent DM2, A1c 6.3 on preop labs.  Labs reviewed, mild anemia with hemoglobin 10.2, mild thrombocytosis with platelets 517k, otherwise unremarkable.  EKG 11/11/2021:  Event monitor 12/28/2021:   Normal sinus rhythm   No significant arrhythmis   No correlation between symptoms and rhythm   No actionable arrhythmia   OVERALL, normal study.  TTE 11/28/2021: 1. Left ventricular ejection fraction, by estimation, is 60 to 65%. The  left ventricle has normal function. The left ventricle has no regional  wall motion abnormalities. Indeterminate diastolic filling due to E-A  fusion.   2. Right ventricular systolic function is normal. The right ventricular  size is normal. Tricuspid regurgitation signal is inadequate for assessing  PA pressure.   3. The mitral valve is normal in structure. No evidence of mitral valve  regurgitation. No evidence of mitral stenosis.   4. The aortic valve is tricuspid. Aortic valve regurgitation is not  visualized. No aortic stenosis is present.   5. The inferior vena cava is normal in size with greater than 50%  respiratory variability, suggesting right atrial pressure of 3 mmHg.     Wynonia Musty [facility] Short Stay Center/Anesthesiology Phone [phone] 02/21/2022 4:15 PM

## 2022-02-24 ENCOUNTER — Other Ambulatory Visit: Payer: Self-pay | Admitting: Radiology

## 2022-02-24 ENCOUNTER — Other Ambulatory Visit (HOSPITAL_COMMUNITY): Payer: Self-pay

## 2022-02-24 DIAGNOSIS — Z79890 Hormone replacement therapy (postmenopausal): Secondary | ICD-10-CM

## 2022-02-25 NOTE — Telephone Encounter (Signed)
Last AEX 11/26/2021--recall placed for 11/2022. However, JC reports for pt to make 65mof/u.   Msg sent to appt desk.

## 2022-02-25 NOTE — Telephone Encounter (Signed)
Melton Alar, CMA Left message for patient to call and schedule appointment.

## 2022-02-26 NOTE — Telephone Encounter (Signed)
No answer, LDVM per DPR.

## 2022-02-26 NOTE — Telephone Encounter (Signed)
FYI. Pt cb to report that her ortho surgeon told her she would be ok to keep patch on during procedure but would just have to wear on opposite limb.

## 2022-02-26 NOTE — Telephone Encounter (Signed)
Was she advised by her orthopedic to stop the estrogen before surgery?

## 2022-02-26 NOTE — Telephone Encounter (Signed)
Pt LVM stating that she has knee surgery scheduled for 02/28/2022 and has to work all day on 02/27/2022 so is unable to make appt until some time in 03/2022.  Please advise on refill request. Thanks.

## 2022-02-27 ENCOUNTER — Other Ambulatory Visit (HOSPITAL_COMMUNITY): Payer: Self-pay

## 2022-02-27 ENCOUNTER — Other Ambulatory Visit: Payer: Self-pay

## 2022-02-27 MED ORDER — TRANEXAMIC ACID 1000 MG/10ML IV SOLN
2000.0000 mg | INTRAVENOUS | Status: AC
  Filled 2022-02-27 (×2): qty 20

## 2022-02-27 MED ORDER — PROGESTERONE MICRONIZED 100 MG PO CAPS
100.0000 mg | ORAL_CAPSULE | Freq: Every day | ORAL | 0 refills | Status: DC
  Filled 2022-02-27: qty 30, 30d supply, fill #0
  Filled 2022-03-31: qty 30, 30d supply, fill #1
  Filled 2022-04-28: qty 30, 30d supply, fill #2

## 2022-02-27 MED ORDER — ESTRADIOL 0.05 MG/24HR TD PTTW
1.0000 | MEDICATED_PATCH | TRANSDERMAL | 0 refills | Status: DC
  Filled 2022-02-27: qty 8, 28d supply, fill #0
  Filled 2022-03-20: qty 8, 28d supply, fill #1
  Filled 2022-04-28: qty 8, 28d supply, fill #2

## 2022-02-27 NOTE — Telephone Encounter (Signed)
Left DVM per DPR on machine notifying pt of 11mosupply approval.

## 2022-02-28 NOTE — Telephone Encounter (Signed)
Pt scheduled f/u for 04/02/2022.

## 2022-03-05 ENCOUNTER — Other Ambulatory Visit: Payer: Self-pay

## 2022-03-05 ENCOUNTER — Encounter (HOSPITAL_COMMUNITY)
Admission: RE | Disposition: A | Discharge status: Home Health | Payer: Self-pay | Source: Home / Self Care | Attending: Orthopedic Surgery

## 2022-03-05 ENCOUNTER — Observation Stay (HOSPITAL_COMMUNITY)
Admission: RE | Admit: 2022-03-05 | Discharge: 2022-03-06 | Disposition: A | Discharge status: Home Health | Payer: 59 | Attending: Orthopedic Surgery | Admitting: Orthopedic Surgery

## 2022-03-05 ENCOUNTER — Ambulatory Visit (HOSPITAL_COMMUNITY): Payer: 59 | Admitting: Physician Assistant

## 2022-03-05 ENCOUNTER — Ambulatory Visit (HOSPITAL_BASED_OUTPATIENT_CLINIC_OR_DEPARTMENT_OTHER): Payer: 59 | Admitting: Certified Registered"

## 2022-03-05 ENCOUNTER — Encounter (HOSPITAL_COMMUNITY): Payer: Self-pay | Admitting: Orthopedic Surgery

## 2022-03-05 DIAGNOSIS — Z01818 Encounter for other preprocedural examination: Secondary | ICD-10-CM

## 2022-03-05 DIAGNOSIS — E119 Type 2 diabetes mellitus without complications: Secondary | ICD-10-CM | POA: Insufficient documentation

## 2022-03-05 DIAGNOSIS — M1711 Unilateral primary osteoarthritis, right knee: Secondary | ICD-10-CM

## 2022-03-05 DIAGNOSIS — Z96651 Presence of right artificial knee joint: Secondary | ICD-10-CM

## 2022-03-05 DIAGNOSIS — Z7984 Long term (current) use of oral hypoglycemic drugs: Secondary | ICD-10-CM | POA: Diagnosis not present

## 2022-03-05 DIAGNOSIS — Z79899 Other long term (current) drug therapy: Secondary | ICD-10-CM | POA: Insufficient documentation

## 2022-03-05 DIAGNOSIS — I10 Essential (primary) hypertension: Secondary | ICD-10-CM | POA: Diagnosis not present

## 2022-03-05 HISTORY — PX: TOTAL KNEE ARTHROPLASTY: SHX125

## 2022-03-05 LAB — GLUCOSE, CAPILLARY
Glucose-Capillary: 198 mg/dL — ABNORMAL HIGH (ref 70–99)
Glucose-Capillary: 136 mg/dL — ABNORMAL HIGH (ref 70–99)
Glucose-Capillary: 95 mg/dL (ref 70–99)

## 2022-03-05 LAB — POCT PREGNANCY, URINE: Preg Test, Ur: NEGATIVE

## 2022-03-05 SURGERY — ARTHROPLASTY, KNEE, TOTAL
Anesthesia: Spinal | Site: Knee | Laterality: Right

## 2022-03-05 MED ORDER — ASPIRIN 325 MG PO TBEC
325.0000 mg | DELAYED_RELEASE_TABLET | Freq: Every day | ORAL | Status: DC
  Administered 2022-03-06: 325 mg via ORAL
  Filled 2022-03-05: qty 1

## 2022-03-05 MED ORDER — ONDANSETRON HCL 4 MG PO TABS: 4.0000 mg | ORAL_TABLET | Freq: Four times a day (QID) | ORAL | Status: DC | PRN

## 2022-03-05 MED ORDER — POLYETHYLENE GLYCOL 3350 17 G PO PACK: 17.0000 g | PACK | Freq: Every day | ORAL | Status: DC | PRN

## 2022-03-05 MED ORDER — EPHEDRINE SULFATE-NACL 50-0.9 MG/10ML-% IV SOSY
PREFILLED_SYRINGE | INTRAVENOUS | Status: DC | PRN
  Administered 2022-03-05 (×3): 5 mg via INTRAVENOUS

## 2022-03-05 MED ORDER — SODIUM CHLORIDE 0.9 % IR SOLN
Status: DC | PRN
  Administered 2022-03-05: 1000 mL

## 2022-03-05 MED ORDER — PROGESTERONE MICRONIZED 100 MG PO CAPS
100.0000 mg | ORAL_CAPSULE | Freq: Every day | ORAL | Status: DC
  Administered 2022-03-05 – 2022-03-06 (×2): 100 mg via ORAL
  Filled 2022-03-05 (×2): qty 1

## 2022-03-05 MED ORDER — EZETIMIBE 10 MG PO TABS
10.0000 mg | ORAL_TABLET | Freq: Every day | ORAL | Status: DC
  Administered 2022-03-05 – 2022-03-06 (×2): 10 mg via ORAL
  Filled 2022-03-05 (×2): qty 1

## 2022-03-05 MED ORDER — DOCUSATE SODIUM 100 MG PO CAPS
100.0000 mg | ORAL_CAPSULE | Freq: Two times a day (BID) | ORAL | Status: DC
  Administered 2022-03-05 – 2022-03-06 (×3): 100 mg via ORAL
  Filled 2022-03-05 (×3): qty 1

## 2022-03-05 MED ORDER — ALPRAZOLAM 0.25 MG PO TABS: 0.2500 mg | ORAL_TABLET | Freq: Three times a day (TID) | ORAL | Status: DC | PRN

## 2022-03-05 MED ORDER — PHENOL 1.4 % MT LIQD: 1.0000 | OROMUCOSAL | Status: DC | PRN

## 2022-03-05 MED ORDER — METOCLOPRAMIDE HCL 5 MG/ML IJ SOLN: 5.0000 mg | Freq: Three times a day (TID) | INTRAMUSCULAR | Status: DC | PRN

## 2022-03-05 MED ORDER — LACTATED RINGERS IV SOLN: INTRAVENOUS | Status: DC

## 2022-03-05 MED ORDER — CEFAZOLIN SODIUM-DEXTROSE 1-4 GM/50ML-% IV SOLN
1.0000 g | Freq: Four times a day (QID) | INTRAVENOUS | Status: AC
  Administered 2022-03-05 (×2): 1 g via INTRAVENOUS
  Filled 2022-03-05 (×2): qty 50

## 2022-03-05 MED ORDER — GLIPIZIDE 5 MG PO TABS
7.5000 mg | ORAL_TABLET | Freq: Two times a day (BID) | ORAL | Status: DC
  Administered 2022-03-05 – 2022-03-06 (×2): 7.5 mg via ORAL
  Filled 2022-03-05 (×2): qty 2

## 2022-03-05 MED ORDER — OXYCODONE HCL 5 MG/5ML PO SOLN: 5.0000 mg | Freq: Once | ORAL | Status: DC | PRN

## 2022-03-05 MED ORDER — CEFAZOLIN SODIUM-DEXTROSE 2-4 GM/100ML-% IV SOLN
INTRAVENOUS | Status: AC
  Filled 2022-03-05: qty 100

## 2022-03-05 MED ORDER — OXYCODONE HCL 5 MG PO TABS: 5.0000 mg | ORAL_TABLET | Freq: Once | ORAL | Status: DC | PRN

## 2022-03-05 MED ORDER — PHENYLEPHRINE HCL-NACL 20-0.9 MG/250ML-% IV SOLN
INTRAVENOUS | Status: DC | PRN
  Administered 2022-03-05: 20 ug/min via INTRAVENOUS

## 2022-03-05 MED ORDER — 0.9 % SODIUM CHLORIDE (POUR BTL) OPTIME
TOPICAL | Status: DC | PRN
  Administered 2022-03-05: 1000 mL

## 2022-03-05 MED ORDER — DIPHENHYDRAMINE HCL 12.5 MG/5ML PO ELIX
12.5000 mg | ORAL_SOLUTION | ORAL | Status: DC | PRN
  Administered 2022-03-06: 25 mg via ORAL
  Filled 2022-03-05: qty 10

## 2022-03-05 MED ORDER — BUPIVACAINE HCL (PF) 0.5 % IJ SOLN
INTRAMUSCULAR | Status: DC | PRN
  Administered 2022-03-05: 15 mL via PERINEURAL

## 2022-03-05 MED ORDER — PANTOPRAZOLE SODIUM 40 MG PO TBEC
40.0000 mg | DELAYED_RELEASE_TABLET | Freq: Every day | ORAL | Status: DC
  Administered 2022-03-05 – 2022-03-06 (×2): 40 mg via ORAL
  Filled 2022-03-05 (×2): qty 1

## 2022-03-05 MED ORDER — METOCLOPRAMIDE HCL 5 MG PO TABS: 5.0000 mg | ORAL_TABLET | Freq: Three times a day (TID) | ORAL | Status: DC | PRN

## 2022-03-05 MED ORDER — FENTANYL CITRATE (PF) 100 MCG/2ML IJ SOLN: 50.0000 ug | Freq: Once | INTRAMUSCULAR | Status: AC

## 2022-03-05 MED ORDER — OXYCODONE HCL 5 MG PO TABS
10.0000 mg | ORAL_TABLET | ORAL | Status: DC | PRN
  Administered 2022-03-05: 10 mg via ORAL
  Administered 2022-03-05 – 2022-03-06 (×2): 15 mg via ORAL
  Administered 2022-03-06: 10 mg via ORAL
  Filled 2022-03-05: qty 3
  Filled 2022-03-05 (×2): qty 2
  Filled 2022-03-05: qty 3

## 2022-03-05 MED ORDER — ORAL CARE MOUTH RINSE: 15.0000 mL | Freq: Once | OROMUCOSAL | Status: AC

## 2022-03-05 MED ORDER — METHOCARBAMOL 500 MG PO TABS
500.0000 mg | ORAL_TABLET | Freq: Four times a day (QID) | ORAL | Status: DC | PRN
  Administered 2022-03-05 – 2022-03-06 (×2): 500 mg via ORAL
  Filled 2022-03-05 (×2): qty 1

## 2022-03-05 MED ORDER — DULOXETINE HCL 60 MG PO CPEP
60.0000 mg | ORAL_CAPSULE | Freq: Every day | ORAL | Status: DC
  Administered 2022-03-05 – 2022-03-06 (×2): 60 mg via ORAL
  Filled 2022-03-05 (×2): qty 1

## 2022-03-05 MED ORDER — OXYCODONE HCL 5 MG PO TABS: 5.0000 mg | ORAL_TABLET | ORAL | Status: DC | PRN

## 2022-03-05 MED ORDER — BUPIVACAINE IN DEXTROSE 0.75-8.25 % IT SOLN
INTRATHECAL | Status: DC | PRN
  Administered 2022-03-05: 1.6 mL via INTRATHECAL

## 2022-03-05 MED ORDER — BUPIVACAINE LIPOSOME 1.3 % IJ SUSP
INTRAMUSCULAR | Status: DC | PRN
  Administered 2022-03-05: 10 mL via PERINEURAL

## 2022-03-05 MED ORDER — INSULIN ASPART 100 UNIT/ML IJ SOLN
0.0000 [IU] | INTRAMUSCULAR | Status: DC | PRN
  Administered 2022-03-05: 4 [IU] via SUBCUTANEOUS
  Filled 2022-03-05: qty 1

## 2022-03-05 MED ORDER — MIDAZOLAM HCL 2 MG/2ML IJ SOLN
INTRAMUSCULAR | Status: AC
  Administered 2022-03-05: 1 mg via INTRAVENOUS
  Filled 2022-03-05: qty 2

## 2022-03-05 MED ORDER — METHOCARBAMOL 1000 MG/10ML IJ SOLN: 500.0000 mg | Freq: Four times a day (QID) | INTRAVENOUS | Status: DC | PRN

## 2022-03-05 MED ORDER — ACETAMINOPHEN 325 MG PO TABS: 325.0000 mg | ORAL_TABLET | Freq: Four times a day (QID) | ORAL | Status: DC | PRN

## 2022-03-05 MED ORDER — MAGNESIUM CITRATE PO SOLN: 1.0000 | Freq: Once | ORAL | Status: DC | PRN

## 2022-03-05 MED ORDER — CHLORHEXIDINE GLUCONATE 0.12 % MT SOLN
OROMUCOSAL | Status: AC
  Administered 2022-03-05: 15 mL via OROMUCOSAL
  Filled 2022-03-05: qty 15

## 2022-03-05 MED ORDER — AMISULPRIDE (ANTIEMETIC) 5 MG/2ML IV SOLN: 10.0000 mg | Freq: Once | INTRAVENOUS | Status: DC | PRN

## 2022-03-05 MED ORDER — ONDANSETRON HCL 4 MG/2ML IJ SOLN: 4.0000 mg | Freq: Once | INTRAMUSCULAR | Status: DC | PRN

## 2022-03-05 MED ORDER — METFORMIN HCL 850 MG PO TABS
850.0000 mg | ORAL_TABLET | Freq: Two times a day (BID) | ORAL | Status: DC
  Administered 2022-03-05 – 2022-03-06 (×2): 850 mg via ORAL
  Filled 2022-03-05 (×4): qty 1

## 2022-03-05 MED ORDER — CEFAZOLIN SODIUM-DEXTROSE 2-4 GM/100ML-% IV SOLN
2.0000 g | INTRAVENOUS | Status: AC
  Administered 2022-03-05: 2 g via INTRAVENOUS

## 2022-03-05 MED ORDER — ROSUVASTATIN CALCIUM 20 MG PO TABS
40.0000 mg | ORAL_TABLET | Freq: Every day | ORAL | Status: DC
  Administered 2022-03-05 – 2022-03-06 (×2): 40 mg via ORAL
  Filled 2022-03-05 (×2): qty 2

## 2022-03-05 MED ORDER — SODIUM CHLORIDE 0.9 % IV SOLN: INTRAVENOUS | Status: DC

## 2022-03-05 MED ORDER — MIDAZOLAM HCL 2 MG/2ML IJ SOLN: 1.0000 mg | Freq: Once | INTRAMUSCULAR | Status: AC

## 2022-03-05 MED ORDER — HYDROMORPHONE HCL 1 MG/ML IJ SOLN: 0.2500 mg | INTRAMUSCULAR | Status: DC | PRN

## 2022-03-05 MED ORDER — GLIPIZIDE 5 MG PO TABS
7.5000 mg | ORAL_TABLET | Freq: Two times a day (BID) | ORAL | Status: DC
  Filled 2022-03-05 (×2): qty 1

## 2022-03-05 MED ORDER — ONDANSETRON HCL 4 MG/2ML IJ SOLN
4.0000 mg | Freq: Four times a day (QID) | INTRAMUSCULAR | Status: DC | PRN
  Administered 2022-03-05: 4 mg via INTRAVENOUS
  Filled 2022-03-05: qty 2

## 2022-03-05 MED ORDER — BISACODYL 10 MG RE SUPP: 10.0000 mg | Freq: Every day | RECTAL | Status: DC | PRN

## 2022-03-05 MED ORDER — MENTHOL 3 MG MT LOZG: 1.0000 | LOZENGE | OROMUCOSAL | Status: DC | PRN

## 2022-03-05 MED ORDER — PROPOFOL 500 MG/50ML IV EMUL
INTRAVENOUS | Status: DC | PRN
  Administered 2022-03-05: 75 ug/kg/min via INTRAVENOUS

## 2022-03-05 MED ORDER — FENTANYL CITRATE (PF) 100 MCG/2ML IJ SOLN
INTRAMUSCULAR | Status: AC
  Administered 2022-03-05: 50 ug via INTRAVENOUS
  Filled 2022-03-05: qty 2

## 2022-03-05 MED ORDER — PROPOFOL 10 MG/ML IV BOLUS
INTRAVENOUS | Status: DC | PRN
  Administered 2022-03-05: 10 mg via INTRAVENOUS
  Administered 2022-03-05: 30 mg via INTRAVENOUS

## 2022-03-05 MED ORDER — LOSARTAN POTASSIUM 25 MG PO TABS
25.0000 mg | ORAL_TABLET | Freq: Every day | ORAL | Status: DC
  Administered 2022-03-06: 25 mg via ORAL
  Filled 2022-03-05: qty 1

## 2022-03-05 MED ORDER — ALUM & MAG HYDROXIDE-SIMETH 200-200-20 MG/5ML PO SUSP: 30.0000 mL | ORAL | Status: DC | PRN

## 2022-03-05 MED ORDER — TRANEXAMIC ACID-NACL 1000-0.7 MG/100ML-% IV SOLN
1000.0000 mg | INTRAVENOUS | Status: AC
  Administered 2022-03-05: 1000 mg via INTRAVENOUS

## 2022-03-05 MED ORDER — PHENYLEPHRINE 80 MCG/ML (10ML) SYRINGE FOR IV PUSH (FOR BLOOD PRESSURE SUPPORT)
PREFILLED_SYRINGE | INTRAVENOUS | Status: DC | PRN
  Administered 2022-03-05 (×3): 80 ug via INTRAVENOUS

## 2022-03-05 MED ORDER — HYDROMORPHONE HCL 1 MG/ML IJ SOLN
0.5000 mg | INTRAMUSCULAR | Status: DC | PRN
  Administered 2022-03-05 – 2022-03-06 (×3): 1 mg via INTRAVENOUS
  Filled 2022-03-05 (×3): qty 1

## 2022-03-05 MED ORDER — TRANEXAMIC ACID 1000 MG/10ML IV SOLN
2000.0000 mg | Freq: Once | INTRAVENOUS | Status: AC
  Administered 2022-03-05: 2000 mg via TOPICAL
  Filled 2022-03-05: qty 20

## 2022-03-05 MED ORDER — BUPIVACAINE LIPOSOME 1.3 % IJ SUSP
INTRAMUSCULAR | Status: AC
  Filled 2022-03-05: qty 10

## 2022-03-05 MED ORDER — CHLORHEXIDINE GLUCONATE 0.12 % MT SOLN: 15.0000 mL | Freq: Once | OROMUCOSAL | Status: AC

## 2022-03-05 MED ORDER — TRANEXAMIC ACID-NACL 1000-0.7 MG/100ML-% IV SOLN
INTRAVENOUS | Status: AC
  Filled 2022-03-05: qty 100

## 2022-03-05 MED ORDER — METOPROLOL SUCCINATE ER 50 MG PO TB24
50.0000 mg | ORAL_TABLET | Freq: Every day | ORAL | Status: DC
  Administered 2022-03-05 – 2022-03-06 (×2): 50 mg via ORAL
  Filled 2022-03-05 (×2): qty 1

## 2022-03-05 SURGICAL SUPPLY — 58 items
BAG COUNTER SPONGE SURGICOUNT (BAG) ×1 IMPLANT
BAG SPNG CNTER NS LX DISP (BAG) ×1
BLADE SAGITTAL 25.0X1.19X90 (BLADE) ×1 IMPLANT
BLADE SAW SGTL 13X75X1.27 (BLADE) ×1 IMPLANT
BLADE SURG 21 STRL SS (BLADE) ×2 IMPLANT
BNDG CMPR 5X6 CHSV STRCH STRL (GAUZE/BANDAGES/DRESSINGS)
BNDG COHESIVE 6X5 TAN NS LF (GAUZE/BANDAGES/DRESSINGS) IMPLANT
BNDG COHESIVE 6X5 TAN ST LF (GAUZE/BANDAGES/DRESSINGS) ×2 IMPLANT
BNDG GAUZE DERMACEA FLUFF 4 (GAUZE/BANDAGES/DRESSINGS) ×1 IMPLANT
BNDG GZE DERMACEA 4 6PLY (GAUZE/BANDAGES/DRESSINGS) ×1
BOWL SMART MIX CTS (DISPOSABLE) ×1 IMPLANT
BSPLAT TIB E 2 PG STRL KN RT (Stem) ×1 IMPLANT
COMP FEM STD CR 6 RT (Knees) ×1 IMPLANT
COMPONENT FEM STD CR 6 RT (Knees) IMPLANT
COOLER ICEMAN CLASSIC (MISCELLANEOUS) ×1 IMPLANT
COVER SURGICAL LIGHT HANDLE (MISCELLANEOUS) ×1 IMPLANT
CUFF TOURN SGL QUICK 34 (TOURNIQUET CUFF) ×1
CUFF TOURN SGL QUICK 42 (TOURNIQUET CUFF) IMPLANT
CUFF TRNQT CYL 34X4.125X (TOURNIQUET CUFF) ×1 IMPLANT
DRAPE EXTREMITY T 121X128X90 (DISPOSABLE) ×1 IMPLANT
DRAPE HALF SHEET 40X57 (DRAPES) ×2 IMPLANT
DRAPE U-SHAPE 47X51 STRL (DRAPES) ×1 IMPLANT
DRSG ADAPTIC 3X8 NADH LF (GAUZE/BANDAGES/DRESSINGS) ×1 IMPLANT
DURAPREP 26ML APPLICATOR (WOUND CARE) ×1 IMPLANT
ELECT REM PT RETURN 9FT ADLT (ELECTROSURGICAL) ×1
ELECTRODE REM PT RTRN 9FT ADLT (ELECTROSURGICAL) ×1 IMPLANT
FACESHIELD WRAPAROUND (MASK) ×1 IMPLANT
FACESHIELD WRAPAROUND OR TEAM (MASK) ×1 IMPLANT
GAUZE PAD ABD 8X10 STRL (GAUZE/BANDAGES/DRESSINGS) ×1 IMPLANT
GAUZE SPONGE 4X4 12PLY STRL (GAUZE/BANDAGES/DRESSINGS) ×1 IMPLANT
GLOVE BIOGEL PI IND STRL 9 (GLOVE) ×1 IMPLANT
GLOVE SURG ORTHO 9.0 STRL STRW (GLOVE) ×1 IMPLANT
GOWN STRL REUS W/ TWL XL LVL3 (GOWN DISPOSABLE) ×2 IMPLANT
GOWN STRL REUS W/TWL XL LVL3 (GOWN DISPOSABLE) ×2
HANDPIECE INTERPULSE COAX TIP (DISPOSABLE) ×1
HDLS TROCR DRIL PIN KNEE 75 (PIN) ×1
IMPL PATELLA METAL SZ32X10 (Joint) IMPLANT
INSERT TIB ASF SZ 6-7/EF 10 RT (Insert) IMPLANT
KIT BASIN OR (CUSTOM PROCEDURE TRAY) ×1 IMPLANT
KIT TURNOVER KIT B (KITS) ×1 IMPLANT
MANIFOLD NEPTUNE II (INSTRUMENTS) ×1 IMPLANT
NS IRRIG 1000ML POUR BTL (IV SOLUTION) ×1 IMPLANT
PACK TOTAL JOINT (CUSTOM PROCEDURE TRAY) ×1 IMPLANT
PAD ARMBOARD 7.5X6 YLW CONV (MISCELLANEOUS) ×1 IMPLANT
PAD COLD SHLDR WRAP-ON (PAD) ×1 IMPLANT
PIN DRILL HDLS TROCAR 75 4PK (PIN) IMPLANT
SCREW FEMALE HEX FIX 25X2.5 (ORTHOPEDIC DISPOSABLE SUPPLIES) IMPLANT
SET HNDPC FAN SPRY TIP SCT (DISPOSABLE) ×1 IMPLANT
STAPLER VISISTAT 35W (STAPLE) ×1 IMPLANT
STEM TIBIAL TRAB SZE RT (Stem) IMPLANT
SUCTION FRAZIER HANDLE 10FR (MISCELLANEOUS)
SUCTION TUBE FRAZIER 10FR DISP (MISCELLANEOUS) IMPLANT
SUT VIC AB 0 CT1 27 (SUTURE) ×1
SUT VIC AB 0 CT1 27XBRD ANBCTR (SUTURE) ×1 IMPLANT
SUT VIC AB 1 CTX 36 (SUTURE) ×1
SUT VIC AB 1 CTX36XBRD ANBCTR (SUTURE) IMPLANT
TOWEL GREEN STERILE (TOWEL DISPOSABLE) ×1 IMPLANT
TOWEL GREEN STERILE FF (TOWEL DISPOSABLE) ×1 IMPLANT

## 2022-03-05 NOTE — Interval H&P Note (Signed)
History and Physical Interval Note:  03/05/2022 10:04 AM  Cheryl Hart  has presented today for surgery, with the diagnosis of Osteoarthritis Right Knee.  The various methods of treatment have been discussed with the patient and family. After consideration of risks, benefits and other options for treatment, the patient has consented to  Procedure(s): RIGHT TOTAL KNEE ARTHROPLASTY (Right) as a surgical intervention.  The patient's history has been reviewed, patient examined, no change in status, stable for surgery.  I have reviewed the patient's chart and labs.  Questions were answered to the patient's satisfaction.     [name]

## 2022-03-05 NOTE — Anesthesia Procedure Notes (Signed)
Anesthesia Regional Block: Adductor canal block   Pre-Anesthetic Checklist: , timeout performed,  Correct Patient, Correct Site, Correct Laterality,  Correct Procedure, Correct Position, site marked,  Risks and benefits discussed,  Surgical consent,  Pre-op evaluation,  At surgeon's request and post-op pain management  Laterality: Right  Prep: chloraprep       Needles:  Injection technique: Single-shot  Needle Type: Echogenic Stimulator Needle     Needle Length: 10cm  Needle Gauge: 21   Needle insertion depth: 8 cm   Additional Needles:   Procedures:,,,, ultrasound used (permanent image in chart),,    Narrative:  Start time: 03/05/2022 10:07 AM End time: 03/05/2022 10:12 AM Injection made incrementally with aspirations every 5 mL.  Performed by: Personally  Anesthesiologist: Josephine Igo, MD  Additional Notes: Timeout performed. Patient sedated. Relevant anatomy ID'd using [place]. Incremental 2-23m injection of LA with frequent aspiration. Patient tolerated procedure well.

## 2022-03-05 NOTE — Anesthesia Postprocedure Evaluation (Addendum)
Anesthesia Post Note  Patient: Cheryl Hart  Procedure(s) Performed: RIGHT TOTAL KNEE ARTHROPLASTY (Right: Knee)     Patient location during evaluation: PACU Anesthesia Type: Spinal Level of consciousness: oriented and awake and alert Pain management: pain level controlled Vital Signs Assessment: post-procedure vital signs reviewed and stable Respiratory status: spontaneous breathing, respiratory function stable and nonlabored ventilation Cardiovascular status: blood pressure returned to baseline and stable Postop Assessment: no headache, no backache, no apparent nausea or vomiting and spinal receding Anesthetic complications: no   No notable events documented.  Last Vitals:  Vitals:   03/05/22 1230 03/05/22 1245  BP: 101/68 99/71  Pulse: 76 75  Resp: 16 15  Temp:    SpO2: 99% 100%    Last Pain:  Vitals:   03/05/22 1230  TempSrc:   PainSc: 0-No pain                 Damien Cisar A.

## 2022-03-05 NOTE — Progress Notes (Signed)
Pt. Arrived to unit alert and oriented. No c/o pain  pt declined notify of family. Bed in lowest position, call bell within reach

## 2022-03-05 NOTE — Op Note (Signed)
DATE OF SURGERY:  03/05/2022  TIME: 12:06 PM  PATIENT NAME:  Cheryl Hart    AGE: 47 y.o.    PRE-OPERATIVE DIAGNOSIS:  Osteoarthritis Right Knee  POST-OPERATIVE DIAGNOSIS:  Osteoarthritis Right Knee  PROCEDURE:  Procedure(s): RIGHT TOTAL KNEE ARTHROPLASTY  SURGEON: Meridee Score  ASSISTANT: April green  OPERATIVE IMPLANTS: Zimmer Persona  Femur size 6, Tibia size E  2- Peg fixed bearing, Patella size 32 1-peg oval button, with a 10 mm polyethylene insert medial congruent.  @ENCIMAGES$ @      PREOPERATIVE INDICATIONS:   Cheryl Hart is a 47 y.o. year old female with end stage degenerative arthritis of the knee who failed conservative treatment and elected for Total Knee Arthroplasty.   The risks, benefits, and alternatives were discussed at length including but not limited to the risks of infection, bleeding, nerve injury, stiffness, blood clots, the need for revision surgery, cardiopulmonary complications, among others, and they were willing to proceed.  OPERATIVE DESCRIPTION:  The patient was brought to the operative room and placed in a supine position.  Anesthesia was administered.  IV antibiotics were given.  IV TXA was initiated.  The lower extremity was prepped and draped in the usual sterile fashion.  Charlie Pitter was used to cover all exposed skin. Time out was performed.    Anterior quadriceps tendon splitting approach was performed.  The patella was everted and osteophytes were removed.  The anterior horn of the medial and lateral meniscus was removed.   The distal femur was opened with the drill and the intramedullary distal femoral cutting jig was utilized, set at 5 degrees valgus resecting 9 mm off the distal femur.  Care was taken to protect the collateral ligaments.  Then the extramedullary tibial cutting jig was utilized set for 3 degree posterior slope.  Care was taken during the cut to protect the medial and collateral ligaments.  The proximal tibia was removed  along with the posterior horns of the menisci.  The PCL was sacrificed.    The extensor gap was measured and was approximately 10 mm.    The distal femoral sizing jig was applied, taking care to avoid notching.  Then the 4-in-1 cutting jig was applied and the anterior and posterior femur was cut, along with the chamfer cuts.  All posterior osteophytes were removed.  The flexion gap was then measured and was symmetric with the extension gap.  The distal femoral preparation using the appropriate jig to prepare the box.  The patella was then measured, and cut with the saw.    The proximal tibia sized and prepared accordingly with the reamer and the punch, and then all components were trialed with the poly insert.  The knee was found to have stable balance and full motion.  The knee was irrigated with normal saline, topical 2 g TXA was used to soak the wound.  The above named components were then press fit into place.  The final polyethylene component was placed.  The knee was then taken through a range of motion and the patella tracked well and the knee irrigated copiously and the parapatellar and subcutaneous tissue closed with vicryl, and skin closed with staples..  A sterile dressing was applied and patient  was taken to the PACU in stable  condition.  There were no complications.  Total tourniquet time was 22 minutes.

## 2022-03-05 NOTE — Evaluation (Signed)
Physical Therapy Evaluation Patient Details Name: Cheryl Hart MRN: [id number] DOB: 06-04-1975 Today's Date: 03/05/2022  History of Present Illness  47 y.o. female presents to [facility] hospital on 03/05/2022 for elective R TKA. PMH includes HTN, depression, DMII, GAD, HLD.  Clinical Impression  Pt presents to PT with deficits in functional mobility, gait, balance, strength, power, endurance. Pt is limited by pain and nausea at this time, RN providing medications during session to manage both. Pt is able to tolerate standing and short distances of ambulation despite symptoms. PT anticipates pt will progress well with further pain management and frequent mobilization.       Recommendations for follow up therapy are one component of a multi-disciplinary discharge planning process, led by the attending physician.  Recommendations may be updated based on patient status, additional functional criteria and insurance authorization.  Follow Up Recommendations Follow physician's recommendations for discharge plan and follow up therapies      Assistance Recommended at Discharge Intermittent Supervision/Assistance  Patient can return home with the following  A little help with walking and/or transfers;A little help with bathing/dressing/bathroom;Assistance with cooking/housework;Assist for transportation;Help with stairs or ramp for entrance    Equipment Recommendations Rolling walker (2 wheels)  Recommendations for Other Services       Functional Status Assessment Patient has had a recent decline in their functional status and demonstrates the ability to make significant improvements in function in a reasonable and predictable amount of time.     Precautions / Restrictions Precautions Precautions: Fall;Knee Precaution Booklet Issued: Yes (comment) Restrictions Weight Bearing Restrictions: Yes RLE Weight Bearing: Weight bearing as tolerated      Mobility  Bed Mobility Overal bed mobility: Needs  Assistance Bed Mobility: Supine to Sit, Sit to Supine     Supine to sit: Min assist Sit to supine: Min guard        Transfers Overall transfer level: Needs assistance Equipment used: Rolling walker (2 wheels) Transfers: Sit to/from Stand Sit to Stand: Min guard                Ambulation/Gait Ambulation/Gait assistance: Min guard Gait Distance (Feet): 15 Feet (additional 10') Assistive device: Rolling walker (2 wheels) Gait Pattern/deviations: Step-to pattern Gait velocity: reduced Gait velocity interpretation: <1.31 ft/sec, indicative of household ambulator   General Gait Details: slowed step-to Development worker, international aid    Modified Rankin (Stroke Patients Only)       Balance Overall balance assessment: Needs assistance Sitting-balance support: No upper extremity supported, Feet supported Sitting balance-Leahy Scale: Normal     Standing balance support: Bilateral upper extremity supported, Reliant on assistive device for balance Standing balance-Leahy Scale: Poor                               Pertinent Vitals/Pain Pain Assessment Pain Assessment: 0-10 Pain Score: 10-Worst pain ever Pain Location: R knee Pain Descriptors / Indicators: Aching Pain Intervention(s): RN gave pain meds during session    Home Living Family/patient expects to be discharged to:: Private residence Living Arrangements: Other relatives;Children Available Help at Discharge: Family;Available PRN/intermittently Type of Home: Apartment Home Access: Stairs to enter Entrance Stairs-Rails: Can reach both Entrance Stairs-Number of Steps: 4   Home Layout: One level Home Equipment: None      Prior Function Prior Level of Function : Independent/Modified Independent;Working/employed;Driving  Mobility Comments: Education officer, museum at [employer]        Extremity/Trunk Assessment   Upper Extremity  Assessment Upper Extremity Assessment: Overall WFL for tasks assessed    Lower Extremity Assessment Lower Extremity Assessment: RLE deficits/detail RLE Deficits / Details: ankle ROM WFL, knee ROM with post-op flexion limitations as expected post-TKA. knee extension 3/5, hip flexion 3-/5    Cervical / Trunk Assessment Cervical / Trunk Assessment: Normal  Communication   Communication: No difficulties  Cognition Arousal/Alertness: Awake/alert Behavior During Therapy: WFL for tasks assessed/performed Overall Cognitive Status: Within Functional Limits for tasks assessed                                          General Comments General comments (skin integrity, edema, etc.): VSS on RA, pt with significant pain and nausea limiting mobility tolerance    Exercises     Assessment/Plan    PT Assessment Patient needs continued PT services  PT Problem List Decreased strength;Decreased range of motion;Decreased activity tolerance;Decreased balance;Decreased mobility;Decreased knowledge of use of DME;Pain       PT Treatment Interventions DME instruction;Gait training;Stair training;Functional mobility training;Therapeutic activities;Therapeutic exercise;Balance training;Neuromuscular re-education;Patient/family education    PT Goals (Current goals can be found in the Care Plan section)  Acute Rehab PT Goals Patient Stated Goal: to reduce pain, return to independence PT Goal Formulation: With patient Time For Goal Achievement: 03/09/22 Potential to Achieve Goals: Good    Frequency 7X/week     Co-evaluation               AM-PAC PT "6 Clicks" Mobility  Outcome Measure Help needed turning from your back to your side while in a flat bed without using bedrails?: A Little Help needed moving from lying on your back to sitting on the side of a flat bed without using bedrails?: A Little Help needed moving to and from a bed to a chair (including a wheelchair)?: A  Little Help needed standing up from a chair using your arms (e.g., wheelchair or bedside chair)?: A Little Help needed to walk in hospital room?: A Little Help needed climbing 3-5 steps with a railing? : A Lot 6 Click Score: 17    End of Session   Activity Tolerance: Patient limited by pain Patient left: in bed;with call bell/phone within reach;with bed alarm set Nurse Communication: Mobility status PT Visit Diagnosis: Muscle weakness (generalized) (M62.81)    Time: [id number] PT Time Calculation (min) (ACUTE ONLY): 35 min   Charges:   PT Evaluation $PT Eval Low Complexity: [place], PT, DPT Acute Rehabilitation Office [phone]   Zenaida Niece 03/05/2022, 5:21 PM

## 2022-03-05 NOTE — H&P (Signed)
TOTAL KNEE ADMISSION H&P  Patient is being admitted for right total knee arthroplasty.  Subjective:  Chief Complaint:right knee pain.  HPI: Cheryl Hart, 47 y.o. female, has a history of pain and functional disability in the right knee due to arthritis and has failed non-surgical conservative treatments for greater than 12 weeks to includeNSAID's and/or analgesics, corticosteriod injections, and activity modification.  Onset of symptoms was gradual, starting 8 years ago with gradually worsening course since that time. The patient noted no past surgery on the right knee(s).  Patient currently rates pain in the right knee(s) at 8 out of 10 with activity. Patient has night pain, worsening of pain with activity and weight bearing, pain that interferes with activities of daily living, pain with passive range of motion, crepitus, and joint swelling.  Patient has evidence of subchondral cysts, subchondral sclerosis, periarticular osteophytes, joint subluxation, and joint space narrowing by imaging studies. This patient has had avascular necrosis of the knee. There is no active infection.  Patient Active Problem List   Diagnosis Date Noted   Palpitations 11/11/2021   Essential hypertension 11/11/2021   Depression 09/21/2020   PCOS (polycystic ovarian syndrome) 09/21/2020   Vitamin B12 deficiency 09/21/2020   Type 2 diabetes mellitus without complication, without long-term current use of insulin ([place]) 08/16/2020   Preventative health care 08/14/2020   Type 2 diabetes mellitus with hyperglycemia, without long-term current use of insulin ([place]) 04/13/2020   Toe pain, bilateral 04/13/2020   Generalized anxiety disorder 04/18/2019   Diabetic polyneuropathy associated with type 2 diabetes mellitus ([place]) 04/18/2019   Anemia    Dyslipidemia 12/01/2018   Tachycardia 12/01/2018   HTN (hypertension) 10/01/2018   Fibroids    Fibroid, uterine    Obesity (BMI 30-39.9) 02/21/2013   Hyperlipidemia 03/13/2011    MORBID OBESITY 08/08/2008   Uncontrolled type 2 diabetes mellitus with complication, without long-term current use of insulin 03/31/2008   UNSPECIFIED ANEMIA 03/31/2008   GLYCOSURIA 03/29/2008   LIVER FUNCTION TESTS, ABNORMAL, HX OF 02/28/2007   ABDOMINAL PAIN 02/17/2007   ANXIETY 11/18/2006   GESTATIONAL DIABETES 11/18/2006   FATIGUE 11/18/2006   HEADACHE 11/18/2006   ABDOMINAL PAIN, EPIGASTRIC 11/18/2006   Past Medical History:  Diagnosis Date   Anemia    Anxiety    Arthritis    KNEE RIGHT   Blood transfusion without reported diagnosis    Depression    Diabetes mellitus    History of kidney stones    Hyperlipidemia    Hypertension    PT.DENIES,MEDS FOR KIDNEY AND PALPATIONS   PCOS (polycystic ovarian syndrome)    Uterine fibroid    Vitamin B12 deficiency     Past Surgical History:  Procedure Laterality Date   CESAREAN SECTION     CHOLECYSTECTOMY     g1 p1     LAPAROSCOPIC GELPORT ASSISTED MYOMECTOMY  02/12/2015   Dr Barbie Banner    No current facility-administered medications for this encounter.   Current Outpatient Medications  Medication Sig Dispense Refill Last Dose   ALPRAZolam (XANAX) 0.25 MG tablet Take 1 tablet (0.25 mg total) by mouth 3 (three) times daily as needed. 30 tablet 1    Biotin 5000 MCG TABS Take 5,000 mcg by mouth every 14 (fourteen) days.      diphenhydrAMINE (BENADRYL) 25 MG tablet Take 50 mg by mouth at bedtime as needed for sleep.       Dulaglutide (TRULICITY) 1.5 0000000 SOPN Inject 1.5 mg into the skin once a week. (Patient taking differently: Inject 1.5  mg into the skin every Wednesday.) 6 mL 3 02/19/2022   DULoxetine (CYMBALTA) 60 MG capsule Take 1 capsule (60 mg total) by mouth daily. 90 capsule 0    ezetimibe (ZETIA) 10 MG tablet Take 1 tablet (10 mg total) by mouth daily. 30 tablet 2    ferrous sulfate 325 (65 FE) MG EC tablet Take 325 mg by mouth daily.      glipiZIDE (GLUCOTROL) 5 MG tablet Take 1.5 tablets (7.5 mg total) by mouth 2 (two)  times daily before a meal. 270 tablet 1    losartan (COZAAR) 25 MG tablet Take 1 tablet (25 mg total) by mouth daily. 90 tablet 1    metFORMIN (GLUCOPHAGE) 850 MG tablet Take 1 tablet (850 mg total) by mouth 2 (two) times daily with a meal. 180 tablet 3    metoprolol succinate (TOPROL-XL) 50 MG 24 hr tablet Take 1 tablet (50 mg total) by mouth daily. Take with or immediately following a meal. 90 tablet 3    Multiple Vitamin (MULTIVITAMIN WITH MINERALS) TABS tablet Take 1 tablet by mouth daily.      nystatin [facility]) powder Apply 1 application topically 3 (three) times daily. (Patient taking differently: Apply 1 Application topically daily as needed (irritation).) 60 g 3    rosuvastatin (CRESTOR) 40 MG tablet Take 1 tablet (40 mg total) by mouth daily. 30 tablet 2    Vitamin D, Ergocalciferol, (DRISDOL) 1.25 MG (50000 UNIT) CAPS capsule Take 1 capsule (50,000 Units total) by mouth every 7 (seven) days. 12 capsule 1    Blood Glucose Monitoring Suppl (BLOOD GLUCOSE MONITOR SYSTEM) w/Device KIT use as drected to check blood sugar (Patient not taking: Reported on 01/17/2022) 1 kit 0    estradiol (VIVELLE-DOT) 0.05 MG/24HR patch Place 1 patch (0.05 mg total) onto the skin 2 (two) times a week. 24 patch 0    glucose blood (FREESTYLE LITE) test strip Use as instructed to check blood sugar 2 times daily 100 each 5    ibuprofen (ADVIL) 600 MG tablet Take 1 tablet (600 mg total) by mouth every 6 (six) hours as needed. (Patient not taking: Reported on 01/17/2022) 30 tablet 2    progesterone (PROMETRIUM) 100 MG capsule Take 1 capsule (100 mg total) by mouth daily. 90 capsule 0    Allergies  Allergen Reactions   Sulfa Antibiotics Other (See Comments) and Rash   Sulfonamide Derivatives Photosensitivity, Rash and Other (See Comments)    Severe headache    Social History   Tobacco Use   Smoking status: Never    Passive exposure: Never   Smokeless tobacco: Never  Substance Use Topics   Alcohol use: No     Family History  Problem Relation Age of Onset   Crohn's disease Mother    Diabetes Mother    Hypertension Mother    Hyperlipidemia Mother    Kidney disease Mother    Thyroid disease Mother    Anxiety disorder Mother    Diabetes Father    Hypertension Father    Anxiety disorder Father    Liver disease Father    Alcoholism Father    Drug abuse Father    Depression Father    Diabetes Brother    Migraines Other    Colon cancer Neg Hx    Colon polyps Neg Hx    Esophageal cancer Neg Hx    Rectal cancer Neg Hx    Stomach cancer Neg Hx    Ulcerative colitis Neg Hx  Review of Systems  All other systems reviewed and are negative.   Objective:  Physical Exam  Vital signs in last 24 hours:    Labs:   Estimated body mass index is 32.43 kg/m as calculated from the following:   Height as of 02/20/22: 5' 6.5" (1.689 m).   Weight as of 02/20/22: 92.5 kg.   Imaging Review Plain radiographs demonstrate moderate degenerative joint disease of the right knee(s). The overall alignment ismild varus. The bone quality appears to be adequate for age and reported activity level.      Assessment/Plan:  End stage arthritis, right knee   The patient history, physical examination, clinical judgment of the provider and imaging studies are consistent with end stage degenerative joint disease of the right knee(s) and total knee arthroplasty is deemed medically necessary. The treatment options including medical management, injection therapy arthroscopy and arthroplasty were discussed at length. The risks and benefits of total knee arthroplasty were presented and reviewed. The risks due to aseptic loosening, infection, stiffness, patella tracking problems, thromboembolic complications and other imponderables were discussed. The patient acknowledged the explanation, agreed to proceed with the plan and consent was signed. Patient is being admitted for inpatient treatment for surgery, pain  control, PT, OT, prophylactic antibiotics, VTE prophylaxis, progressive ambulation and ADL's and discharge planning. The patient is planning to be discharged home with home health services     Patient's anticipated LOS is less than 2 midnights, meeting these requirements: - Younger than 54 - Lives within 1 hour of care - Has a competent adult at home to recover with post-op recover - NO history of  - Chronic pain requiring opiods  - Diabetes  - Coronary Artery Disease  - Heart failure  - Heart attack  - Stroke  - DVT/VTE  - Cardiac arrhythmia  - Respiratory Failure/COPD  - Renal failure  - Anemia  - Advanced Liver disease

## 2022-03-05 NOTE — Discharge Instructions (Signed)

## 2022-03-05 NOTE — Plan of Care (Signed)
  Problem: Education: Goal: Knowledge of the prescribed therapeutic regimen will improve Outcome: Progressing   Problem: Activity: Goal: Ability to avoid complications of mobility impairment will improve Outcome: Progressing   Problem: Skin Integrity: Goal: Will show signs of wound healing Outcome: Progressing   Problem: Education: Goal: Knowledge of General Education information will improve Description: Including pain rating scale, medication(s)/side effects and non-pharmacologic comfort measures Outcome: Progressing   Problem: Activity: Goal: Risk for activity intolerance will decrease Outcome: Progressing   Problem: Pain Managment: Goal: General experience of comfort will improve Outcome: Progressing   Problem: Safety: Goal: Ability to remain free from injury will improve Outcome: Progressing   Problem: Skin Integrity: Goal: Risk for impaired skin integrity will decrease Outcome: Progressing

## 2022-03-05 NOTE — Transfer of Care (Signed)
Immediate Anesthesia Transfer of Care Note  Patient: Cheryl Hart  Procedure(s) Performed: RIGHT TOTAL KNEE ARTHROPLASTY (Right: Knee)  Patient Location: PACU  Anesthesia Type:Spinal and MAC combined with regional for post-op pain  Level of Consciousness: awake, alert , and oriented  Airway & Oxygen Therapy: Patient Spontanous Breathing  Post-op Assessment: Report given to RN and Post -op Vital signs reviewed and stable  Post vital signs: Reviewed and stable  Last Vitals:  Vitals Value Taken Time  BP 85/57 03/05/22 1156  Temp    Pulse 79 03/05/22 1159  Resp 21 03/05/22 1159  SpO2 99 % 03/05/22 1159  Vitals shown include unvalidated device data.  Last Pain:  Vitals:   03/05/22 1016  TempSrc:   PainSc: 0-No pain         Complications: No notable events documented.

## 2022-03-05 NOTE — Anesthesia Procedure Notes (Signed)
Spinal  Patient location during procedure: OR Start time: 03/05/2022 10:34 AM End time: 03/05/2022 10:38 AM Reason for block: surgical anesthesia Staffing Performed by: Josephine Igo, MD Authorized by: Josephine Igo, MD   Preanesthetic Checklist Completed: patient identified, IV checked, site marked, risks and benefits discussed, surgical consent, monitors and equipment checked, pre-op evaluation and timeout performed Spinal Block Patient position: sitting Prep: DuraPrep and site prepped and draped Patient monitoring: heart rate, cardiac monitor, continuous pulse ox and blood pressure Approach: midline Location: L3-4 Injection technique: single-shot Needle Needle type: Pencan  Needle gauge: 24 G Needle length: 9 cm Needle insertion depth: 8 cm Assessment Sensory level: T6 Events: CSF return Additional Notes Patient tolerated procedure well. Adequate sensory level.

## 2022-03-06 ENCOUNTER — Other Ambulatory Visit (HOSPITAL_COMMUNITY): Payer: Self-pay

## 2022-03-06 ENCOUNTER — Encounter (HOSPITAL_COMMUNITY): Payer: Self-pay | Admitting: Orthopedic Surgery

## 2022-03-06 DIAGNOSIS — M1711 Unilateral primary osteoarthritis, right knee: Secondary | ICD-10-CM | POA: Diagnosis not present

## 2022-03-06 LAB — GLUCOSE, CAPILLARY: Glucose-Capillary: 166 mg/dL — ABNORMAL HIGH (ref 70–99)

## 2022-03-06 MED ORDER — OXYCODONE-ACETAMINOPHEN 5-325 MG PO TABS
1.0000 | ORAL_TABLET | ORAL | 0 refills | Status: DC | PRN
  Filled 2022-03-06: qty 30, 5d supply, fill #0

## 2022-03-06 NOTE — Progress Notes (Cosign Needed)
    Durable Medical Equipment  (From admission, onward)           Start     Ordered   03/06/22 1145  For home use only DME Bedside commode  Once       Comments: Confine to one one room  Question:  Patient needs a bedside commode to treat with the following condition  Answer:  S/P TKR (total knee replacement)   03/06/22 1145   03/06/22 0953  For home use only DME Walker rolling  Once       Question Answer Comment  Walker: With [place]   Patient needs a walker to treat with the following condition Gait instability      03/06/22 0953

## 2022-03-06 NOTE — Progress Notes (Signed)
Physical Therapy Treatment Patient Details Name: Cheryl Hart MRN: [id number] DOB: January 28, 1975 Today's Date: 03/06/2022   History of Present Illness 47 y.o. female presents to [facility] hospital on 03/05/2022 for elective R TKA. PMH includes HTN, depression, DMII, GAD, HLD.    PT Comments    Pt was received sitting in recliner and agreeable to session with uncle present. Pt was able to perform all mobility with up to min guard for safety. Pt initially demonstrated very antalgic gait which improved as pt's R knee became "less stiff" per pt report. Pt was able to safely demonstrate stair trial with cues for sequence and pt's uncle present for education on guarding techniques. Pt and uncle asking appropriate questions during session and reporting feeling comfortable with returning home. Anticipate pt and family will be able to manage pt's mobility needs at home.    Recommendations for follow up therapy are one component of a multi-disciplinary discharge planning process, led by the attending physician.  Recommendations may be updated based on patient status, additional functional criteria and insurance authorization.  Follow Up Recommendations  Follow physician's recommendations for discharge plan and follow up therapies     Assistance Recommended at Discharge Intermittent Supervision/Assistance  Patient can return home with the following A little help with walking and/or transfers;A little help with bathing/dressing/bathroom;Assistance with cooking/housework;Assist for transportation;Help with stairs or ramp for entrance   Equipment Recommendations  Rolling walker (2 wheels)    Recommendations for Other Services       Precautions / Restrictions Precautions Precautions: Fall;Knee Restrictions Weight Bearing Restrictions: Yes RLE Weight Bearing: Weight bearing as tolerated     Mobility  Bed Mobility               General bed mobility comments: Pt sitting in recliner upon arrival     Transfers Overall transfer level: Needs assistance Equipment used: Rolling walker (2 wheels) Transfers: Sit to/from Stand Sit to Stand: Min guard           General transfer comment: from recliner to RW and from lower bench to RW with min guard for safety and cues for safe hand and RLE placement    Ambulation/Gait Ambulation/Gait assistance: Min guard Gait Distance (Feet): 115 Feet Assistive device: Rolling walker (2 wheels) Gait Pattern/deviations: Step-to pattern, Step-through pattern, Antalgic Gait velocity: reduced     General Gait Details: Initially step-to progressing to slight step-through with cues to not step past RW and min guard for safety.   Stairs Stairs: Yes Stairs assistance: Min guard Stair Management: One rail Right Number of Stairs: 4 General stair comments: Pt able to ascend/descend 4 stairs with min guard for safety and cues for sequence. Pt used R rail and L HHA to simulate 2 rails at pt's home.       Balance Overall balance assessment: Needs assistance Sitting-balance support: No upper extremity supported, Feet supported Sitting balance-Leahy Scale: Normal Sitting balance - Comments: in recliner   Standing balance support: Bilateral upper extremity supported, Reliant on assistive device for balance, During functional activity Standing balance-Leahy Scale: Poor Standing balance comment: with RW support                            Cognition Arousal/Alertness: Awake/alert Behavior During Therapy: WFL for tasks assessed/performed Overall Cognitive Status: Within Functional Limits for tasks assessed  Exercises      General Comments General comments (skin integrity, edema, etc.): VSS      Pertinent Vitals/Pain Pain Assessment Pain Assessment: 0-10 Pain Score: 7  Pain Location: R knee Pain Descriptors / Indicators: Aching, Guarding Pain Intervention(s): Limited  activity within patient's tolerance, Monitored during session, Ice applied, Repositioned     PT Goals (current goals can now be found in the care plan section) Acute Rehab PT Goals Patient Stated Goal: to reduce pain, return to independence PT Goal Formulation: With patient Time For Goal Achievement: 03/09/22 Potential to Achieve Goals: Good Progress towards PT goals: Progressing toward goals    Frequency    7X/week      PT Plan Current plan remains appropriate       AM-PAC PT "6 Clicks" Mobility   Outcome Measure  Help needed turning from your back to your side while in a flat bed without using bedrails?: A Little Help needed moving from lying on your back to sitting on the side of a flat bed without using bedrails?: A Little Help needed moving to and from a bed to a chair (including a wheelchair)?: A Little Help needed standing up from a chair using your arms (e.g., wheelchair or bedside chair)?: A Little Help needed to walk in hospital room?: A Little Help needed climbing 3-5 steps with a railing? : A Little 6 Click Score: 18    End of Session Equipment Utilized During Treatment: Gait belt Activity Tolerance: Patient tolerated treatment well Patient left: in chair;with call bell/phone within reach Nurse Communication: Mobility status PT Visit Diagnosis: Muscle weakness (generalized) (M62.81)     Time: [id number] PT Time Calculation (min) (ACUTE ONLY): 27 min  Charges:  $Gait Training: 23-37 mins                     Michelle Nasuti, PTA Acute Rehabilitation Services Secure Chat Preferred  [phone]    Michelle Nasuti 03/06/2022, 10:22 AM

## 2022-03-06 NOTE — Discharge Summary (Signed)
Discharge Diagnoses:  Principal Problem:   Total knee replacement status, right Active Problems:   Unilateral primary osteoarthritis, right knee   Surgeries: Procedure(s): RIGHT TOTAL KNEE ARTHROPLASTY on 03/05/2022    Consultants:   Discharged Condition: Improved  Hospital Course: Cheryl Hart is an 47 y.o. female who was admitted 03/05/2022 with a chief complaint of osteoarthritis right knee, with a final diagnosis of Osteoarthritis Right Knee.  Patient was brought to the operating room on 03/05/2022 and underwent Procedure(s): RIGHT TOTAL KNEE ARTHROPLASTY.    Patient was given perioperative antibiotics:  Anti-infectives (From admission, onward)    Start     Dose/Rate Route Frequency Ordered Stop   03/05/22 1630  ceFAZolin (ANCEF) IVPB 1 g/50 mL premix        1 g 100 mL/hr over 30 Minutes Intravenous Every 6 hours 03/05/22 1515 03/05/22 2203   03/05/22 0845  ceFAZolin (ANCEF) IVPB 2g/100 mL premix        2 g 200 mL/hr over 30 Minutes Intravenous On call to O.R. 03/05/22 0836 03/05/22 1108   03/05/22 0840  ceFAZolin (ANCEF) 2-4 GM/100ML-% IVPB       Note to Pharmacy: Demetrios Isaacs A: cabinet override      03/05/22 0840 03/05/22 1055     .  Patient was given sequential compression devices, early ambulation, and aspirin for DVT prophylaxis.  Recent vital signs: Patient Vitals for the past 24 hrs:  BP Temp Temp src Pulse Resp SpO2 Height Weight  03/06/22 0504 122/80 98.1 F (36.7 C) -- 81 19 97 % -- --  03/06/22 0100 102/63 98 F (36.7 C) Oral 85 18 100 % -- --  03/05/22 1946 132/88 97.8 F (36.6 C) Oral 88 -- 100 % -- --  03/05/22 1531 115/68 97.8 F (36.6 C) Oral 84 16 97 % -- --  03/05/22 1500 (!) 122/91 97.8 F (36.6 C) -- 84 15 97 % -- --  03/05/22 1445 117/79 -- -- 79 18 94 % -- --  03/05/22 1430 116/78 -- -- 79 19 100 % -- --  03/05/22 1415 113/75 -- -- -- 18 99 % -- --  03/05/22 1400 116/77 -- -- 79 18 98 % -- --  03/05/22 1345 114/88 -- -- 78 18 100 % -- --   03/05/22 1330 106/78 -- -- 78 16 100 % -- --  03/05/22 1315 107/80 -- -- 79 16 98 % -- --  03/05/22 1300 109/74 -- -- 77 16 99 % -- --  03/05/22 1245 99/71 -- -- 75 15 100 % -- --  03/05/22 1230 101/68 -- -- 76 16 99 % -- --  03/05/22 1215 100/69 -- -- 79 15 100 % -- --  03/05/22 1200 117/76 -- -- 79 14 99 % -- --  03/05/22 1155 (!) 85/57 -- -- 86 15 100 % -- --  03/05/22 1016 117/85 -- -- 79 14 100 % -- --  03/05/22 1010 111/78 -- -- 81 16 100 % -- --  03/05/22 1007 125/77 -- -- 88 16 99 % -- --  03/05/22 1000 111/73 -- -- 88 -- 100 % -- --  03/05/22 0853 119/81 97.8 F (36.6 C) Oral 97 18 99 % 5' 6.5" (1.689 m) 92.5 kg  .  Recent laboratory studies: No results found.  Discharge Medications:   Allergies as of 03/06/2022       Reactions   Sulfa Antibiotics Other (See Comments), Rash   Sulfonamide Derivatives Photosensitivity, Rash, Other (See Comments)   Severe  headache        Medication List     TAKE these medications    ALPRAZolam 0.25 MG tablet Commonly known as: XANAX Take 1 tablet (0.25 mg total) by mouth 3 (three) times daily as needed.   Biotin 5000 MCG Tabs Take 5,000 mcg by mouth every 14 (fourteen) days.   Blood Glucose Monitor System w/Device Kit use as drected to check blood sugar   diphenhydrAMINE 25 MG tablet Commonly known as: BENADRYL Take 50 mg by mouth at bedtime as needed for sleep.   DULoxetine 60 MG capsule Commonly known as: CYMBALTA Take 1 capsule (60 mg total) by mouth daily.   estradiol 0.05 MG/24HR patch Commonly known as: VIVELLE-DOT Place 1 patch (0.05 mg total) onto the skin 2 (two) times a week.   ezetimibe 10 MG tablet Commonly known as: Zetia Take 1 tablet (10 mg total) by mouth daily.   ferrous sulfate 325 (65 FE) MG EC tablet Take 325 mg by mouth daily.   FREESTYLE LITE test strip Generic drug: glucose blood Use as instructed to check blood sugar 2 times daily   glipiZIDE 5 MG tablet Commonly known as:  GLUCOTROL Take 1.5 tablets (7.5 mg total) by mouth 2 (two) times daily before a meal.   ibuprofen 600 MG tablet Commonly known as: ADVIL Take 1 tablet (600 mg total) by mouth every 6 (six) hours as needed.   losartan 25 MG tablet Commonly known as: COZAAR Take 1 tablet (25 mg total) by mouth daily.   metFORMIN 850 MG tablet Commonly known as: GLUCOPHAGE Take 1 tablet (850 mg total) by mouth 2 (two) times daily with a meal.   metoprolol succinate 50 MG 24 hr tablet Commonly known as: TOPROL-XL Take 1 tablet (50 mg total) by mouth daily. Take with or immediately following a meal.   multivitamin with minerals Tabs tablet Take 1 tablet by mouth daily.   nystatin powder Commonly known as: Nyamyc Apply 1 application topically 3 (three) times daily. What changed:  when to take this reasons to take this   oxyCODONE-acetaminophen 5-325 MG tablet Commonly known as: PERCOCET/ROXICET Take 1 tablet by mouth every 4 (four) hours as needed.   progesterone 100 MG capsule Commonly known as: PROMETRIUM Take 1 capsule (100 mg total) by mouth daily.   rosuvastatin 40 MG tablet Commonly known as: CRESTOR Take 1 tablet (40 mg total) by mouth daily.   Trulicity 1.5 0000000 Sopn Generic drug: Dulaglutide Inject 1.5 mg into the skin once a week. What changed: when to take this   Vitamin D (Ergocalciferol) 1.25 MG (50000 UNIT) Caps capsule Commonly known as: DRISDOL Take 1 capsule (50,000 Units total) by mouth every 7 (seven) days.        Diagnostic Studies: No results found.  Patient benefited maximally from their hospital stay and there were no complications.     Disposition: Discharge disposition: 01-Home or Self Care      Discharge Instructions     Call MD / Call 911   Complete by: As directed    If you experience chest pain or shortness of breath, CALL 911 and be transported to the hospital emergency room.  If you develope a fever above 101 F, pus (white drainage) or  increased drainage or redness at the wound, or calf pain, call your surgeon's office.   Constipation Prevention   Complete by: As directed    Drink plenty of fluids.  Prune juice may be helpful.  You may use a  stool softener, such as Colace (over the counter) 100 mg twice a day.  Use MiraLax (over the counter) for constipation as needed.   Diet - low sodium heart healthy   Complete by: As directed    Increase activity slowly as tolerated   Complete by: As directed    Post-operative opioid taper instructions:   Complete by: As directed    POST-OPERATIVE OPIOID TAPER INSTRUCTIONS: It is important to wean off of your opioid medication as soon as possible. If you do not need pain medication after your surgery it is ok to stop day one. Opioids include: Codeine, Hydrocodone(Norco, Vicodin), Oxycodone(Percocet, oxycontin) and hydromorphone amongst others.  Long term and even short term use of opiods can cause: Increased pain response Dependence Constipation Depression Respiratory depression And more.  Withdrawal symptoms can include Flu like symptoms Nausea, vomiting And more Techniques to manage these symptoms Hydrate well Eat regular healthy meals Stay active Use relaxation techniques(deep breathing, meditating, yoga) Do Not substitute Alcohol to help with tapering If you have been on opioids for less than two weeks and do not have pain than it is ok to stop all together.  Plan to wean off of opioids This plan should start within one week post op of your joint replacement. Maintain the same interval or time between taking each dose and first decrease the dose.  Cut the total daily intake of opioids by one tablet each day Next start to increase the time between doses. The last dose that should be eliminated is the evening dose.          Follow-up Information     [name], MD Follow up in 1 week(s).   Specialty: Orthopedic Surgery Contact information: [address] [place] [address] [phone]                  Signed: [name] 03/06/2022, 7:48 AM

## 2022-03-06 NOTE — TOC Transition Note (Signed)
Transition of Care [facility]) - CM/SW Discharge Note   Patient Details  Name: Cheryl Hart MRN: [id number] Date of Birth: [birth year]  [facility] CM/SW Contact:  Sharin Mons, RN Phone Number: 03/06/2022, 11:53 AM   Clinical Narrative:    Patient will DC to: home Anticipated DC date: 03/06/2022 Family notified:yes Transport by: car            -   s/p R TKA, 2/21 Per MD patient ready for DC today with clearance from PT . RN, patient, and patient's family notified of DC. Pt states daughter and niece to assist with care once d/c. [place] prearranged by provider to provide [place] services. Referral made with [facility] for RW and BSC. Equipment will be delivered to bedside  prior to d/c. Pt without RX med concerns. Post hospital f/u noted on AVS.   RNCM will sign off for now as intervention is no longer needed. Please consult [place] again if new needs arise.     Final next level of care: [place] Barriers to Discharge: No Barriers Identified   Patient Goals and CMS Choice   Choice offered to / list presented to : Patient  Discharge Placement                         Discharge Plan and Services Additional resources added to the After Visit Summary for                  DME Arranged: Walker rolling DME Agency: [facility] Date DME Agency Contacted: 03/06/22 Time DME Agency Contacted: 1003 Representative spoke with at DME Agency: Erasmo Downer HH Arranged: PT [place]: [place] Date [place]: 03/06/22 Time [place]: [id number] Representative spoke with at [place]: [place] Determinants of Health ([place]) Interventions SDOH Screenings   Food Insecurity: No Food Insecurity (03/05/2022)  Housing: Low Risk  (03/05/2022)  Transportation Needs: No Transportation Needs (03/05/2022)  Utilities: Not At Risk (03/05/2022)  Depression (PHQ2-9): Low Risk  (11/11/2021)  Tobacco Use: Low Risk  (03/05/2022)      Readmission Risk Interventions     No data to display

## 2022-03-06 NOTE — Progress Notes (Signed)
Patient ID: Cheryl Hart, female   DOB: 1975/05/26, 47 y.o.   MRN: [id number] Patient is postoperative day 1 total knee arthroplasty.  Patient states she has been up ambulating last night.  Plan for discharge to home today after physical therapy.  She will need home health physical therapy and DME and she needs to take her ice machine with her.

## 2022-03-06 NOTE — Progress Notes (Signed)
Discharge instructions are given to the pt. All questions answered.

## 2022-03-07 ENCOUNTER — Telehealth: Payer: Self-pay | Admitting: Orthopedic Surgery

## 2022-03-07 NOTE — Telephone Encounter (Signed)
Pt is s/p total knee arthroplasty on 03/05/22. She and HHRN are unsure if you wanted pt to take ASA once a day? Pt is unsure of how often you told her.

## 2022-03-07 NOTE — Telephone Encounter (Signed)
Pt informed. She is also been scheduled for her 2 week post op appt too.

## 2022-03-10 ENCOUNTER — Encounter: Payer: 59 | Admitting: Orthopedic Surgery

## 2022-03-10 ENCOUNTER — Telehealth: Payer: Self-pay

## 2022-03-10 NOTE — Transitions of Care (Post Inpatient/ED Visit) (Signed)
   03/10/2022  Name: Cheryl Hart MRN: [id number] DOB: [birth year]  Today's TOC FU Call Status: Today's TOC FU Call Status:: Unsuccessul Call (1st Attempt) Unsuccessful Call (1st Attempt) Date: 03/10/22  Attempted to reach the patient regarding the most recent Inpatient/ED visit.  Follow Up Plan: Additional outreach attempts will be made to reach the patient to complete the Transitions of Care (Post Inpatient/ED visit) call.   Signature Juanda Crumble, [place] Direct Dial [phone]

## 2022-03-11 ENCOUNTER — Other Ambulatory Visit: Payer: Self-pay | Admitting: Family Medicine

## 2022-03-11 DIAGNOSIS — F411 Generalized anxiety disorder: Secondary | ICD-10-CM

## 2022-03-12 ENCOUNTER — Other Ambulatory Visit: Payer: Self-pay

## 2022-03-12 ENCOUNTER — Other Ambulatory Visit (HOSPITAL_COMMUNITY): Payer: Self-pay

## 2022-03-12 MED ORDER — ALPRAZOLAM 0.25 MG PO TABS
0.2500 mg | ORAL_TABLET | Freq: Three times a day (TID) | ORAL | 1 refills | Status: DC | PRN
  Filled 2022-03-12: qty 30, 10d supply, fill #0
  Filled 2022-03-31: qty 30, 10d supply, fill #1

## 2022-03-12 NOTE — Telephone Encounter (Signed)
Requesting: Xanax Contract: 2021 UDS: 2021 Last OV: 11/11/21 Next OV: n/a Last Refill: 02/06/2022, #30--0 RF Database:   Please advise

## 2022-03-12 NOTE — Transitions of Care (Post Inpatient/ED Visit) (Signed)
   03/12/2022  Name: Cheryl Hart MRN: [id number] DOB: 04-Jun-1975  Today's TOC FU Call Status: Today's TOC FU Call Status:: Successful TOC FU Call Competed Unsuccessful Call (1st Attempt) Date: 03/10/22 [facility] FU Call Complete Date: 03/12/22  Transition Care Management Follow-up Telephone Call Date of Discharge: 03/06/22 Discharge Facility: [facility]) Type of Discharge: Inpatient Admission Primary Inpatient Discharge Diagnosis:: right knee replacement How have you been since you were released from the hospital?: Better Any questions or concerns?: No  Items Reviewed: Did you receive and understand the discharge instructions provided?: Yes Medications obtained and verified?: Yes (Medications Reviewed) Any new allergies since your discharge?: No Dietary orders reviewed?: NA Do you have support at home?: Yes People in Home: other relative(s), child(ren), adult  Home Care and Equipment/Supplies: [place] Ordered?: Yes Name of [place]:: [place] set up a time to come to your home?: Yes [place] Visit Date: 03/08/22 Any new equipment or medical supplies ordered?: Yes Name of Medical supply agency?: [facility] Were you able to get the equipment/medical supplies?: Yes Do you have any questions related to the use of the equipment/supplies?: No  Functional Questionnaire: Do you need assistance with bathing/showering or dressing?: No Do you need assistance with meal preparation?: No Do you need assistance with eating?: No Do you have difficulty maintaining continence: No Do you need assistance with getting out of bed/getting out of a chair/moving?: No Do you have difficulty managing or taking your medications?: No  Folllow up appointments reviewed: PCP Follow-up appointment confirmed?: Yes Date of PCP follow-up appointment?: 03/17/22 Follow-up Provider: [facility] Follow-up appointment confirmed?: Yes Date of  Specialist follow-up appointment?: 03/19/22 Follow-Up Specialty Provider:: Dr Sharol Given Do you need transportation to your follow-up appointment?: No Do you understand care options if your condition(s) worsen?: Yes-patient verbalized understanding    [place], [place] Nurse Health Advisor Direct Dial [phone]

## 2022-03-14 ENCOUNTER — Encounter: Payer: Self-pay | Admitting: Gastroenterology

## 2022-03-17 ENCOUNTER — Inpatient Hospital Stay: Payer: 59 | Admitting: Family Medicine

## 2022-03-19 ENCOUNTER — Ambulatory Visit (INDEPENDENT_AMBULATORY_CARE_PROVIDER_SITE_OTHER): Payer: 59

## 2022-03-19 ENCOUNTER — Other Ambulatory Visit (HOSPITAL_COMMUNITY): Payer: Self-pay

## 2022-03-19 ENCOUNTER — Encounter: Payer: Self-pay | Admitting: Family

## 2022-03-19 ENCOUNTER — Ambulatory Visit (INDEPENDENT_AMBULATORY_CARE_PROVIDER_SITE_OTHER): Payer: 59 | Admitting: Family

## 2022-03-19 DIAGNOSIS — M1711 Unilateral primary osteoarthritis, right knee: Secondary | ICD-10-CM | POA: Diagnosis not present

## 2022-03-19 MED ORDER — ONDANSETRON 4 MG PO TBDP
4.0000 mg | ORAL_TABLET | Freq: Three times a day (TID) | ORAL | 0 refills | Status: DC | PRN
  Filled 2022-03-19: qty 30, 10d supply, fill #0

## 2022-03-19 NOTE — Progress Notes (Signed)
Post-Op Visit Note   Patient: Cheryl Hart           Date of Birth: 08-Oct-1975           MRN: [id number] Visit Date: 03/19/2022 PCP: Ann Held, DO  Chief Complaint:  Chief Complaint  Patient presents with   Right Knee - Routine Post Op    03/05/2022 right total knee replacement     HPI:  HPI The patient is a 47 year old woman seen status post right total knee arthroplasty.  She has been using Tylenol and Advil for pain she feels that her pain has not been much of an issue however she does have issues with nausea and vomiting.  She stopped taking the Percocet a week ago but continues to have issues with nausea and vomiting denies any other symptoms systemic symptoms  Pleased with her progress Ortho Exam On examination of the right knee her staples are in place the incision is well-healed there is moderate edema no erythema no warmth near full extension flexion to 90 degrees actively  Visit Diagnoses:  1. Primary osteoarthritis of right knee     Plan: Will continue with her physical therapy.  Staples harvested today.  Will provide prescription for Zofran for her nausea vomiting follow-up in 4 weeks in office.  Call in 1 week for any concerns.  Follow-Up Instructions: No follow-ups on file.   Imaging: No results found.  Orders:  Orders Placed This Encounter  Procedures   XR Knee 1-2 Views Right   No orders of the defined types were placed in this encounter.    PMFS History: Patient Active Problem List   Diagnosis Date Noted   Unilateral primary osteoarthritis, right knee 03/05/2022   Total knee replacement status, right 03/05/2022   Palpitations 11/11/2021   Essential hypertension 11/11/2021   Depression 09/21/2020   PCOS (polycystic ovarian syndrome) 09/21/2020   Vitamin B12 deficiency 09/21/2020   Type 2 diabetes mellitus without complication, without long-term current use of insulin ([place]) 08/16/2020   Preventative health care 08/14/2020   Type 2  diabetes mellitus with hyperglycemia, without long-term current use of insulin ([place]) 04/13/2020   Toe pain, bilateral 04/13/2020   Generalized anxiety disorder 04/18/2019   Diabetic polyneuropathy associated with type 2 diabetes mellitus ([place]) 04/18/2019   Anemia    Dyslipidemia 12/01/2018   Tachycardia 12/01/2018   HTN (hypertension) 10/01/2018   Fibroids    Fibroid, uterine    Obesity (BMI 30-39.9) 02/21/2013   Hyperlipidemia 03/13/2011   MORBID OBESITY 08/08/2008   Uncontrolled type 2 diabetes mellitus with complication, without long-term current use of insulin 03/31/2008   UNSPECIFIED ANEMIA 03/31/2008   GLYCOSURIA 03/29/2008   LIVER FUNCTION TESTS, ABNORMAL, HX OF 02/28/2007   ABDOMINAL PAIN 02/17/2007   ANXIETY 11/18/2006   GESTATIONAL DIABETES 11/18/2006   FATIGUE 11/18/2006   HEADACHE 11/18/2006   ABDOMINAL PAIN, EPIGASTRIC 11/18/2006   Past Medical History:  Diagnosis Date   Anemia    Anxiety    Arthritis    KNEE RIGHT   Blood transfusion without reported diagnosis    Depression    Diabetes mellitus    History of kidney stones    Hyperlipidemia    Hypertension    PT.DENIES,MEDS FOR KIDNEY AND PALPATIONS   PCOS (polycystic ovarian syndrome)    Uterine fibroid    Vitamin B12 deficiency     Family History  Problem Relation Age of Onset   Crohn's disease Mother    Diabetes Mother  Hypertension Mother    Hyperlipidemia Mother    Kidney disease Mother    Thyroid disease Mother    Anxiety disorder Mother    Diabetes Father    Hypertension Father    Anxiety disorder Father    Liver disease Father    Alcoholism Father    Drug abuse Father    Depression Father    Diabetes Brother    Migraines Other    Colon cancer Neg Hx    Colon polyps Neg Hx    Esophageal cancer Neg Hx    Rectal cancer Neg Hx    Stomach cancer Neg Hx    Ulcerative colitis Neg Hx     Past Surgical History:  Procedure Laterality Date   CESAREAN SECTION     CHOLECYSTECTOMY     g1  p1     LAPAROSCOPIC GELPORT ASSISTED MYOMECTOMY  02/12/2015   Dr Barbie Banner   TOTAL KNEE ARTHROPLASTY Right 03/05/2022   Procedure: RIGHT TOTAL KNEE ARTHROPLASTY;  Surgeon: [name], MD;  Location: [facility];  Service: Orthopedics;  Laterality: Right;   Social History   Occupational History   Occupation: Engineer, building services  Tobacco Use   Smoking status: Never    Passive exposure: Never   Smokeless tobacco: Never  Vaping Use   Vaping Use: Never used  Substance and Sexual Activity   Alcohol use: No   Drug use: No   Sexual activity: Not Currently    Partners: Male    Birth control/protection: Post-menopausal    Comment: menarche 47yo, sexual debut 47yo

## 2022-03-20 DIAGNOSIS — M1711 Unilateral primary osteoarthritis, right knee: Secondary | ICD-10-CM

## 2022-03-25 ENCOUNTER — Inpatient Hospital Stay: Payer: 59 | Admitting: Family Medicine

## 2022-04-01 NOTE — Therapy (Signed)
OUTPATIENT PHYSICAL THERAPY EVALUATION   Patient Name: Cheryl Hart MRN: [id number] DOB:15-May-1975, 47 y.o., female Today's Date: 04/03/2022  END OF SESSION:  PT End of Session - 04/03/22 1518     Visit Number 1    Number of Visits 20    Date for PT Re-Evaluation 06/12/22    Authorization Type [employer] $30 copay    Authorization - Visit Number 1    Authorization - Number of Visits 20    Progress Note Due on Visit 10    PT Start Time 1520    PT Stop Time 1549    PT Time Calculation (min) 29 min    Activity Tolerance Patient tolerated treatment well    Behavior During Therapy WFL for tasks assessed/performed             Past Medical History:  Diagnosis Date   Anemia    Anxiety    Arthritis    KNEE RIGHT   Blood transfusion without reported diagnosis    Depression    Diabetes mellitus    History of kidney stones    Hyperlipidemia    Hypertension    PT.DENIES,MEDS FOR KIDNEY AND PALPATIONS   PCOS (polycystic ovarian syndrome)    Uterine fibroid    Vitamin B12 deficiency    Past Surgical History:  Procedure Laterality Date   CESAREAN SECTION     CHOLECYSTECTOMY     g1 p1     LAPAROSCOPIC GELPORT ASSISTED MYOMECTOMY  02/12/2015   Dr Cheryl Hart   TOTAL KNEE ARTHROPLASTY Right 03/05/2022   Procedure: RIGHT TOTAL KNEE ARTHROPLASTY;  Surgeon: Cheryl Minion, MD;  Location: [facility];  Service: Orthopedics;  Laterality: Right;   Patient Active Problem List   Diagnosis Date Noted   Unilateral primary osteoarthritis, right knee 03/05/2022   Total knee replacement status, right 03/05/2022   Palpitations 11/11/2021   Essential hypertension 11/11/2021   Depression 09/21/2020   PCOS (polycystic ovarian syndrome) 09/21/2020   Vitamin B12 deficiency 09/21/2020   Type 2 diabetes mellitus without complication, without long-term current use of insulin ([place]) 08/16/2020   Preventative health care 08/14/2020   Type 2 diabetes mellitus with hyperglycemia, without long-term current use of  insulin ([place]) 04/13/2020   Toe pain, bilateral 04/13/2020   Generalized anxiety disorder 04/18/2019   Diabetic polyneuropathy associated with type 2 diabetes mellitus ([place]) 04/18/2019   Anemia    Dyslipidemia 12/01/2018   Tachycardia 12/01/2018   HTN (hypertension) 10/01/2018   Fibroids    Fibroid, uterine    Obesity (BMI 30-39.9) 02/21/2013   Hyperlipidemia 03/13/2011   MORBID OBESITY 08/08/2008   Uncontrolled type 2 diabetes mellitus with complication, without long-term current use of insulin 03/31/2008   UNSPECIFIED ANEMIA 03/31/2008   GLYCOSURIA 03/29/2008   LIVER FUNCTION TESTS, ABNORMAL, HX OF 02/28/2007   ABDOMINAL PAIN 02/17/2007   ANXIETY 11/18/2006   GESTATIONAL DIABETES 11/18/2006   FATIGUE 11/18/2006   HEADACHE 11/18/2006   ABDOMINAL PAIN, EPIGASTRIC 11/18/2006    PCP: Cheryl Held DO  REFERRING PROVIDER: Suzan Slick, NP  REFERRING DIAG: M17.11 (ICD-10-CM) - Primary osteoarthritis of right knee  THERAPY DIAG:  Chronic pain of right knee  Muscle weakness (generalized)  Difficulty in walking, not elsewhere classified  Localized edema  Rationale for Evaluation and Treatment: Rehabilitation  ONSET DATE: surgery 03/05/2022  SUBJECTIVE:   SUBJECTIVE STATEMENT: S/p Rt TKA 03/05/2022. She indicated having 4 home visits.  She indicated using walker for 2 weeks after surgery then to nothing.  She indicated the pain "isn't really bad" more tight.  Pt indicated getting back to bed recently with some complaints.  She indicated she just went back to work with some complaints afterward.   PERTINENT HISTORY: PMH includes HTN, depression, DMII, GAD, HLD.  PAIN:  NPRS scale: 4/10 at worst  Pain location: Rt knee Pain description: tightness, stiffness Aggravating factors: prolonged WB activity Relieving factors: wrapping with ace bandage, elevation, stretching  PRECAUTIONS: None  WEIGHT BEARING RESTRICTIONS: No  FALLS:  Has patient fallen in last 6  months? No  LIVING ENVIRONMENT: Lives in: House/apartment Stairs: stairs to enter 5 with bilateral hand rail.    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  She indicated having access to gym.   PATIENT GOALS: Reduce pain, getting back to activities.  OBJECTIVE:   PATIENT SURVEYS:  04/03/2022 FOTO intake: 56   predicted:  34  COGNITION: 04/03/2022 Overall cognitive status: WFL    SENSATION: 04/03/2022 No specific testing  EDEMA:  04/03/2022 Visual inspection showed localized edema.   MUSCLE LENGTH: 04/03/2022 No specific testing  POSTURE:  3/21/2024No Significant postural limitations  PALPATION: 04/03/2022 Mild tenderness in Rt quad, incision Rt knee  LOWER EXTREMITY ROM:   ROM Right 04/03/2022 Left 04/03/2022  Hip flexion    Hip extension    Hip abduction    Hip adduction    Hip internal rotation    Hip external rotation    Knee flexion 100 AROM in supine heel slide WFL  Knee extension -6 AROM in seated LAQ [name]  Ankle dorsiflexion    Ankle plantarflexion    Ankle inversion    Ankle eversion     (Blank rows = not tested)  LOWER EXTREMITY MMT:  MMT Right 04/03/2022 Left 04/03/2022  Hip flexion 5/5 5/5  Hip extension    Hip abduction    Hip adduction    Hip internal rotation    Hip external rotation    Knee flexion 5/5 5/5  Knee extension 4/5 5/5  Ankle dorsiflexion 5/5   Ankle plantarflexion    Ankle inversion    Ankle eversion     (Blank rows = not tested)  LOWER EXTREMITY SPECIAL TESTS:  04/03/2022 No specific testing today  FUNCTIONAL TESTS:  04/03/2022 18 inch chair transfer: s UE assist with deviation to Lt leg.  Lt SLS: 20 seconds Rt SLS: 10 seconds  GAIT: 04/03/2022 Independent ambulation into clinic household distances c mild deficits in stance on Rt knee.    TODAY'S TREATMENT                                                                           DATE:04/03/2022 Therex: HEP instruction/performance c cues for techniques, handout provided.  Trial set performed of each for comprehension and symptom assessment.  See below for exercise list  Nustep Lvl 6 8 mins UE/LE for ROM  PATIENT EDUCATION:  04/03/2022 Education details: HEP, POC Person educated: Patient Education method: Consulting civil engineer, Demonstration, Verbal cues, and Handouts Education comprehension: verbalized understanding, returned demonstration, and verbal cues required  HOME EXERCISE PROGRAM: Access Code: [email or web address] URL: [email or web address] Date: 04/03/2022 Prepared by: Scot Jun  Exercises - Supine Heel Slide  -  3-5 x daily - 7 x weekly - 1 sets - 10 reps - 5-10 hold - Supine Knee Extension Mobilization with Weight  - 4-5 x daily - 7 x weekly - 1 sets - 1 reps - to tolerance up to 15 mins hold - Supine Quadricep Sets  - 3-5 x daily - 7 x weekly - 1 sets - 10 reps - 5 hold - Seated Long Arc Quad  - 3-5 x daily - 7 x weekly - 1 sets - 5-10 reps - 2 hold - Seated Quad Set  - 3-5 x daily - 7 x weekly - 1 sets - 10 reps - 5 hold - Seated Knee Flexion Extension AAROM with Overpressure  - 2-3 x daily - 7 x weekly - 1 sets - 5 reps - 10 hold  ASSESSMENT:  CLINICAL IMPRESSION: Patient is a 47 y.o. who comes to clinic with complaints of Rt knee pain s/p recent TKA with mobility, strength and movement coordination deficits that impair their ability to perform usual daily and recreational functional activities without increase difficulty/symptoms at this time.  Patient to benefit from skilled PT services to address impairments and limitations to improve to previous level of function without restriction secondary to condition.   OBJECTIVE IMPAIRMENTS: Abnormal gait, decreased activity tolerance, decreased balance, decreased coordination, decreased endurance, decreased mobility, difficulty walking, decreased ROM, decreased strength, hypomobility, increased edema,  impaired perceived functional ability, impaired flexibility, improper body mechanics, and pain.   ACTIVITY LIMITATIONS: carrying, lifting, bending, sitting, standing, squatting, sleeping, stairs, transfers, bed mobility, and locomotion level  PARTICIPATION LIMITATIONS: meal prep, cleaning, laundry, interpersonal relationship, driving, shopping, and community activity  PERSONAL FACTORS:  PMH includes HTN, depression, DMII, GAD, HLD.  are also affecting patient's functional outcome.   REHAB POTENTIAL: Good  CLINICAL DECISION MAKING: Stable/uncomplicated  EVALUATION COMPLEXITY: Low   GOALS: Goals reviewed with patient? Yes  SHORT TERM GOALS: (target date for Short term goals are 3 weeks 04/24/2022)   1.  Patient will demonstrate independent use of home exercise program to maintain progress from in clinic treatments.  Goal status: New  LONG TERM GOALS: (target dates for all long term goals are 10 weeks  06/12/2022 )   1. Patient will demonstrate/report pain at worst less than or equal to 2/10 to facilitate minimal limitation in daily activity secondary to pain symptoms.  Goal status: New   2. Patient will demonstrate independent use of home exercise program to facilitate ability to maintain/progress functional gains from skilled physical therapy services.  Goal status: New   3. Patient will demonstrate FOTO outcome > or = 68 % to indicate reduced disability due to condition.  Goal status: New   4.  Patient will demonstrate Rt LE MMT 5/5 throughout to faciltiate usual transfers, stairs, squatting at [facility] for daily life.   Goal status: New   5.  Patient will demonstrate Rt knee AROM 0-110 degrees to facilitate usual mobility at PLOF s limitation for ambulation, transfers, stairs.  Goal status: New   6.  Patient will demonstrate independent ambulation community distances > 500 ft for return to PLOF.  Goal status: New   7.  Patient will demonstrate ascending/descending stairs  reciprocally s UE assist for community integration.  Goal Status: New   PLAN:  PT FREQUENCY: 1-2x/week  PT DURATION: 10 weeks  PLANNED INTERVENTIONS: Therapeutic exercises, Therapeutic activity, Neuro Muscular re-education, Balance training, Gait training, Patient/Family education, Joint mobilization, Stair training, DME instructions, Dry Needling, Electrical stimulation, Traction, Cryotherapy, vasopneumatic deviceMoist  heat, Taping, Ultrasound, Ionotophoresis 4mg /ml Dexamethasone, and aquatic therapy, Manual therapy.  All included unless contraindicated  PLAN FOR NEXT SESSION: Review HEP knowledge/results.  Progressive strengthening and early static balance.    Scot Jun, PT, DPT, OCS, ATC 04/03/22  3:48 PM

## 2022-04-02 ENCOUNTER — Ambulatory Visit: Payer: 59 | Admitting: Radiology

## 2022-04-03 ENCOUNTER — Encounter: Payer: Self-pay | Admitting: Rehabilitative and Restorative Service Providers"

## 2022-04-03 ENCOUNTER — Other Ambulatory Visit: Payer: Self-pay

## 2022-04-03 ENCOUNTER — Ambulatory Visit: Payer: 59 | Admitting: Rehabilitative and Restorative Service Providers"

## 2022-04-03 DIAGNOSIS — R262 Difficulty in walking, not elsewhere classified: Secondary | ICD-10-CM | POA: Diagnosis not present

## 2022-04-03 DIAGNOSIS — R600 Localized edema: Secondary | ICD-10-CM

## 2022-04-03 DIAGNOSIS — G8929 Other chronic pain: Secondary | ICD-10-CM

## 2022-04-03 DIAGNOSIS — M6281 Muscle weakness (generalized): Secondary | ICD-10-CM | POA: Diagnosis not present

## 2022-04-03 DIAGNOSIS — M25561 Pain in right knee: Secondary | ICD-10-CM | POA: Diagnosis not present

## 2022-04-04 ENCOUNTER — Encounter: Payer: 59 | Admitting: Gastroenterology

## 2022-04-10 ENCOUNTER — Encounter: Payer: Self-pay | Admitting: Rehabilitative and Restorative Service Providers"

## 2022-04-10 ENCOUNTER — Ambulatory Visit: Payer: 59 | Admitting: Rehabilitative and Restorative Service Providers"

## 2022-04-10 DIAGNOSIS — M25561 Pain in right knee: Secondary | ICD-10-CM

## 2022-04-10 DIAGNOSIS — R600 Localized edema: Secondary | ICD-10-CM

## 2022-04-10 DIAGNOSIS — R262 Difficulty in walking, not elsewhere classified: Secondary | ICD-10-CM | POA: Diagnosis not present

## 2022-04-10 DIAGNOSIS — M6281 Muscle weakness (generalized): Secondary | ICD-10-CM | POA: Diagnosis not present

## 2022-04-10 DIAGNOSIS — G8929 Other chronic pain: Secondary | ICD-10-CM

## 2022-04-10 NOTE — Therapy (Addendum)
OUTPATIENT PHYSICAL THERAPY TREATMENT  /DISCHARGE   Patient Name: Cheryl Hart MRN: [id number] DOB:15-Apr-1975, 47 y.o., female Today's Date: 04/10/2022  END OF SESSION:  PT End of Session - 04/10/22 1437     Visit Number 2    Number of Visits 20    Date for PT Re-Evaluation 06/12/22    Authorization Type [employer] $30 copay    Authorization - Visit Number 2    Authorization - Number of Visits 20    Progress Note Due on Visit 10    PT Start Time 1429    PT Stop Time 1507    PT Time Calculation (min) 38 min    Activity Tolerance Patient tolerated treatment well    Behavior During Therapy WFL for tasks assessed/performed              Past Medical History:  Diagnosis Date   Anemia    Anxiety    Arthritis    KNEE RIGHT   Blood transfusion without reported diagnosis    Depression    Diabetes mellitus    History of kidney stones    Hyperlipidemia    Hypertension    PT.DENIES,MEDS FOR KIDNEY AND PALPATIONS   PCOS (polycystic ovarian syndrome)    Uterine fibroid    Vitamin B12 deficiency    Past Surgical History:  Procedure Laterality Date   CESAREAN SECTION     CHOLECYSTECTOMY     g1 p1     LAPAROSCOPIC GELPORT ASSISTED MYOMECTOMY  02/12/2015   Dr Bonnielee Haff   TOTAL KNEE ARTHROPLASTY Right 03/05/2022   Procedure: RIGHT TOTAL KNEE ARTHROPLASTY;  Surgeon: Nadara Mustard, MD;  Location: [facility] OR;  Service: Orthopedics;  Laterality: Right;   Patient Active Problem List   Diagnosis Date Noted   Unilateral primary osteoarthritis, right knee 03/05/2022   Total knee replacement status, right 03/05/2022   Palpitations 11/11/2021   Essential hypertension 11/11/2021   Depression 09/21/2020   PCOS (polycystic ovarian syndrome) 09/21/2020   Vitamin B12 deficiency 09/21/2020   Type 2 diabetes mellitus without complication, without long-term current use of insulin (HCC) 08/16/2020   Preventative health care 08/14/2020   Type 2 diabetes mellitus with hyperglycemia, without long-term  current use of insulin (HCC) 04/13/2020   Toe pain, bilateral 04/13/2020   Generalized anxiety disorder 04/18/2019   Diabetic polyneuropathy associated with type 2 diabetes mellitus (HCC) 04/18/2019   Anemia    Dyslipidemia 12/01/2018   Tachycardia 12/01/2018   HTN (hypertension) 10/01/2018   Fibroids    Fibroid, uterine    Obesity (BMI 30-39.9) 02/21/2013   Hyperlipidemia 03/13/2011   MORBID OBESITY 08/08/2008   Uncontrolled type 2 diabetes mellitus with complication, without long-term current use of insulin 03/31/2008   UNSPECIFIED ANEMIA 03/31/2008   GLYCOSURIA 03/29/2008   LIVER FUNCTION TESTS, ABNORMAL, HX OF 02/28/2007   ABDOMINAL PAIN 02/17/2007   ANXIETY 11/18/2006   GESTATIONAL DIABETES 11/18/2006   FATIGUE 11/18/2006   HEADACHE 11/18/2006   ABDOMINAL PAIN, EPIGASTRIC 11/18/2006    PCP: Donato Schultz DO  REFERRING PROVIDER: Adonis Huguenin, NP  REFERRING DIAG: M17.11 (ICD-10-CM) - Primary osteoarthritis of right knee  THERAPY DIAG:  Chronic pain of right knee  Muscle weakness (generalized)  Difficulty in walking, not elsewhere classified  Localized edema  Rationale for Evaluation and Treatment: Rehabilitation  ONSET DATE: surgery 03/05/2022  SUBJECTIVE:   SUBJECTIVE STATEMENT: She reported tightness and sore in back of knee.  4/10 upon arrival today.   PERTINENT HISTORY: PMH includes HTN, depression,  DMII, GAD, HLD.  PAIN:  NPRS scale: 4/10 at worst  Pain location: Rt knee Pain description: tightness, stiffness Aggravating factors: prolonged WB activity Relieving factors: wrapping with ace bandage, elevation, stretching  PRECAUTIONS: None  WEIGHT BEARING RESTRICTIONS: No  FALLS:  Has patient fallen in last 6 months? No  LIVING ENVIRONMENT: Lives in: House/apartment Stairs: stairs to enter 5 with bilateral hand rail.    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  She indicated having access to gym.   PATIENT GOALS: Reduce pain, getting back to activities.  OBJECTIVE:   PATIENT SURVEYS:  04/03/2022 FOTO intake: 56   predicted:  68  COGNITION: 04/03/2022 Overall cognitive status: WFL    SENSATION: 04/03/2022 No specific testing  EDEMA:  04/03/2022 Visual inspection showed localized edema.   MUSCLE LENGTH: 04/03/2022 No specific testing  POSTURE:  3/21/2024No Significant postural limitations  PALPATION: 04/03/2022 Mild tenderness in Rt quad, incision Rt knee  LOWER EXTREMITY ROM:   ROM Right 04/03/2022 Left 04/03/2022 Right 04/10/2022  Hip flexion     Hip extension     Hip abduction     Hip adduction     Hip internal rotation     Hip external rotation     Knee flexion 100 AROM in supine heel slide WFL 108 AROM in supine heel slide  Knee extension -6 AROM in seated LAQ WFL -4 AROM in seated LAQ  Ankle dorsiflexion     Ankle plantarflexion     Ankle inversion     Ankle eversion      (Blank rows = not tested)  LOWER EXTREMITY MMT:  MMT Right 04/03/2022 Left 04/03/2022  Hip flexion 5/5 5/5  Hip extension    Hip abduction    Hip adduction    Hip internal rotation    Hip external rotation    Knee flexion 5/5 5/5  Knee extension 4/5 5/5  Ankle dorsiflexion 5/5   Ankle plantarflexion    Ankle inversion    Ankle eversion     (Blank rows = not tested)  LOWER EXTREMITY SPECIAL TESTS:  04/03/2022 No specific testing today  FUNCTIONAL TESTS:  04/03/2022 18 inch chair transfer: s UE assist with deviation to Lt leg.  Lt SLS: 20 seconds Rt SLS: 10 seconds  GAIT: 04/03/2022 Independent ambulation into clinic household distances c mild deficits in stance on Rt knee.    TODAY'S TREATMENT                                                                          DATE:04/10/2022 Therex: UBE LE only lvl 2.5 8 mins Supine Rt leg LAQ x 15 in 90 deg hip flexion Seated SLR Rt 2 x 10  Lateral Step down 4 inch x 15  bilaterally with light hand assist  Seated Rt knee flexion c Lt leg overpressure 15 seconds x 6  Neuro Re-ed Tandem stance 1 min x 1 bilateral on foam with SBA SLS on black mat with contralateral leg corner touching light x 6 each, bilaterally   TODAY'S TREATMENT  DATE:04/03/2022 Therex: HEP instruction/performance c cues for techniques, handout provided.  Trial set performed of each for comprehension and symptom assessment.  See below for exercise list  Nustep Lvl 6 8 mins UE/LE for ROM  PATIENT EDUCATION:  04/03/2022 Education details: HEP, POC Person educated: Patient Education method: Programmer, multimedia, Demonstration, Verbal cues, and Handouts Education comprehension: verbalized understanding, returned demonstration, and verbal cues required  HOME EXERCISE PROGRAM: Access Code: [email or web address] URL: [email or web address] Date: 04/03/2022 Prepared by: Chyrel Masson  Exercises - Supine Heel Slide  - 3-5 x daily - 7 x weekly - 1 sets - 10 reps - 5-10 hold - Supine Knee Extension Mobilization with Weight  - 4-5 x daily - 7 x weekly - 1 sets - 1 reps - to tolerance up to 15 mins hold - Supine Quadricep Sets  - 3-5 x daily - 7 x weekly - 1 sets - 10 reps - 5 hold - Seated Long Arc Quad  - 3-5 x daily - 7 x weekly - 1 sets - 5-10 reps - 2 hold - Seated Quad Set  - 3-5 x daily - 7 x weekly - 1 sets - 10 reps - 5 hold - Seated Knee Flexion Extension AAROM with Overpressure  - 2-3 x daily - 7 x weekly - 1 sets - 5 reps - 10 hold  ASSESSMENT:  CLINICAL IMPRESSION: Ambulation c mild antalgic gait but independent in clinic today.  Good overall performance on activity today c good recall of HEP.  WB activity was mild to moderate difficulty with some mild symptoms noted.  Recommend continued skilled PT services at this time.   OBJECTIVE IMPAIRMENTS: Abnormal gait, decreased activity tolerance, decreased balance,  decreased coordination, decreased endurance, decreased mobility, difficulty walking, decreased ROM, decreased strength, hypomobility, increased edema, impaired perceived functional ability, impaired flexibility, improper body mechanics, and pain.   ACTIVITY LIMITATIONS: carrying, lifting, bending, sitting, standing, squatting, sleeping, stairs, transfers, bed mobility, and locomotion level  PARTICIPATION LIMITATIONS: meal prep, cleaning, laundry, interpersonal relationship, driving, shopping, and community activity  PERSONAL FACTORS:  PMH includes HTN, depression, DMII, GAD, HLD.  are also affecting patient's functional outcome.   REHAB POTENTIAL: Good  CLINICAL DECISION MAKING: Stable/uncomplicated  EVALUATION COMPLEXITY: Low   GOALS: Goals reviewed with patient? Yes  SHORT TERM GOALS: (target date for Short term goals are 3 weeks 04/24/2022)   1.  Patient will demonstrate independent use of home exercise program to maintain progress from in clinic treatments.  Goal status: on going 04/10/2022  LONG TERM GOALS: (target dates for all long term goals are 10 weeks  06/12/2022 )   1. Patient will demonstrate/report pain at worst less than or equal to 2/10 to facilitate minimal limitation in daily activity secondary to pain symptoms.  Goal status: New   2. Patient will demonstrate independent use of home exercise program to facilitate ability to maintain/progress functional gains from skilled physical therapy services.  Goal status: New   3. Patient will demonstrate FOTO outcome > or = 68 % to indicate reduced disability due to condition.  Goal status: New   4.  Patient will demonstrate Rt LE MMT 5/5 throughout to faciltiate usual transfers, stairs, squatting at [facility] for daily life.   Goal status: New   5.  Patient will demonstrate Rt knee AROM 0-110 degrees to facilitate usual mobility at PLOF s limitation for ambulation, transfers, stairs.  Goal status: New   6.  Patient will  demonstrate independent ambulation community distances > 500 ft for return to  PLOF.  Goal status: New   7.  Patient will demonstrate ascending/descending stairs reciprocally s UE assist for community integration.  Goal Status: New   PLAN:  PT FREQUENCY: 1-2x/week  PT DURATION: 10 weeks  PLANNED INTERVENTIONS: Therapeutic exercises, Therapeutic activity, Neuro Muscular re-education, Balance training, Gait training, Patient/Family education, Joint mobilization, Stair training, DME instructions, Dry Needling, Electrical stimulation, Traction, Cryotherapy, vasopneumatic deviceMoist heat, Taping, Ultrasound, Ionotophoresis 4mg /ml Dexamethasone, and aquatic therapy, Manual therapy.  All included unless contraindicated  PLAN FOR NEXT SESSION:  Progressive strengthening and balance compliant surfacnes.  Leg press addition.    Chyrel Masson, PT, DPT, OCS, ATC 04/10/22  3:05 PM   PHYSICAL THERAPY DISCHARGE SUMMARY  Visits from Start of Care: 2  Current functional level related to goals / functional outcomes: See note   Remaining deficits: See note   Education / Equipment: HEP  Patient goals were not met. Patient is being discharged due to not returning since the last visit.  Chyrel Masson, PT, DPT, OCS, ATC 04/30/22  3:33 PM

## 2022-04-15 ENCOUNTER — Other Ambulatory Visit: Payer: Self-pay

## 2022-04-15 ENCOUNTER — Encounter: Payer: 59 | Admitting: Rehabilitative and Restorative Service Providers"

## 2022-04-15 ENCOUNTER — Other Ambulatory Visit (HOSPITAL_COMMUNITY): Payer: Self-pay

## 2022-04-15 ENCOUNTER — Other Ambulatory Visit: Payer: Self-pay | Admitting: Family Medicine

## 2022-04-15 DIAGNOSIS — F411 Generalized anxiety disorder: Secondary | ICD-10-CM

## 2022-04-15 MED ORDER — ALPRAZOLAM 0.25 MG PO TABS
0.2500 mg | ORAL_TABLET | Freq: Three times a day (TID) | ORAL | 1 refills | Status: DC | PRN
  Filled 2022-04-15: qty 30, 10d supply, fill #0
  Filled 2022-05-05: qty 30, 10d supply, fill #1

## 2022-04-15 NOTE — Telephone Encounter (Signed)
Requesting:alprazolam 0.25mg   Contract: 05/13/21 UDS: 04/18/19 Last Visit: 11/11/21 Next Visit: None Last Refill: 03/12/22 #30 and 0RF   Please Advise

## 2022-04-17 ENCOUNTER — Encounter: Payer: 59 | Admitting: Rehabilitative and Restorative Service Providers"

## 2022-04-18 ENCOUNTER — Other Ambulatory Visit (HOSPITAL_COMMUNITY): Payer: Self-pay

## 2022-04-18 ENCOUNTER — Other Ambulatory Visit: Payer: Self-pay | Admitting: Family Medicine

## 2022-04-18 DIAGNOSIS — F339 Major depressive disorder, recurrent, unspecified: Secondary | ICD-10-CM

## 2022-04-18 DIAGNOSIS — E1142 Type 2 diabetes mellitus with diabetic polyneuropathy: Secondary | ICD-10-CM

## 2022-04-18 MED ORDER — DULOXETINE HCL 60 MG PO CPEP
60.0000 mg | ORAL_CAPSULE | Freq: Every day | ORAL | 0 refills | Status: DC
  Filled 2022-04-18: qty 30, 30d supply, fill #0
  Filled 2022-05-13: qty 30, 30d supply, fill #1
  Filled 2022-06-18: qty 30, 30d supply, fill #2

## 2022-04-22 ENCOUNTER — Encounter: Payer: 59 | Admitting: Rehabilitative and Restorative Service Providers"

## 2022-04-22 ENCOUNTER — Telehealth: Payer: Self-pay | Admitting: Rehabilitative and Restorative Service Providers"

## 2022-04-22 ENCOUNTER — Ambulatory Visit: Payer: 59 | Admitting: Family

## 2022-04-22 NOTE — Telephone Encounter (Signed)
Called patient after 15 mins no show for appointment today.  Left message and reminder of next appointment time, as well as call back number.    Chyrel Masson, PT, DPT, OCS, ATC 04/22/22  10:33 AM

## 2022-04-24 ENCOUNTER — Encounter: Payer: 59 | Admitting: Rehabilitative and Restorative Service Providers"

## 2022-04-24 ENCOUNTER — Ambulatory Visit: Payer: 59 | Admitting: Radiology

## 2022-04-24 ENCOUNTER — Telehealth: Payer: Self-pay | Admitting: Rehabilitative and Restorative Service Providers"

## 2022-04-24 NOTE — Telephone Encounter (Signed)
Called patient after 15 mins no show for appointment today.  Left message and reminder of next appointment time, as well as call back number.    Chyrel Masson, PT, DPT, OCS, ATC 04/24/22  2:47 PM

## 2022-04-29 ENCOUNTER — Other Ambulatory Visit: Payer: Self-pay

## 2022-04-30 ENCOUNTER — Encounter: Payer: 59 | Admitting: Rehabilitative and Restorative Service Providers"

## 2022-05-01 ENCOUNTER — Ambulatory Visit: Payer: 59 | Admitting: Radiology

## 2022-05-02 ENCOUNTER — Ambulatory Visit: Payer: 59 | Admitting: Radiology

## 2022-05-02 ENCOUNTER — Other Ambulatory Visit (HOSPITAL_COMMUNITY): Payer: Self-pay

## 2022-05-02 DIAGNOSIS — Z79890 Hormone replacement therapy (postmenopausal): Secondary | ICD-10-CM

## 2022-05-02 MED ORDER — ESTRADIOL 0.05 MG/24HR TD PTTW
1.0000 | MEDICATED_PATCH | TRANSDERMAL | 3 refills | Status: DC
Start: 2022-05-05 — End: 2022-07-22
  Filled 2022-05-02: qty 24, 84d supply, fill #0
  Filled 2022-05-19: qty 8, 28d supply, fill #0
  Filled 2022-06-22: qty 8, 28d supply, fill #1

## 2022-05-02 MED ORDER — PROGESTERONE MICRONIZED 100 MG PO CAPS
100.0000 mg | ORAL_CAPSULE | Freq: Every day | ORAL | 3 refills | Status: DC
Start: 2022-05-02 — End: 2023-05-25
  Filled 2022-05-02: qty 90, 90d supply, fill #0
  Filled 2022-05-31: qty 30, 30d supply, fill #0
  Filled 2022-07-07: qty 30, 30d supply, fill #1
  Filled 2022-08-04: qty 30, 30d supply, fill #2
  Filled 2022-09-07: qty 30, 30d supply, fill #3
  Filled 2022-10-13: qty 30, 30d supply, fill #4
  Filled 2022-11-17: qty 30, 30d supply, fill #5
  Filled 2022-12-17: qty 30, 30d supply, fill #6
  Filled 2023-01-22: qty 30, 30d supply, fill #7
  Filled 2023-02-23 – 2023-02-24 (×2): qty 30, 30d supply, fill #8
  Filled 2023-03-26: qty 30, 30d supply, fill #9
  Filled 2023-04-26: qty 30, 30d supply, fill #10

## 2022-05-02 NOTE — Progress Notes (Signed)
   Vannia Pola 1975/12/22 [id number]   History:  47 y.o. here for follow up after starting Hrt for vasomotor sx and trouble sleeping 3 months ago. Doing well. Symptoms have improved greater than 75%, happy on current dose.  Gynecologic History No LMP recorded. Patient has had an ablation.    Obstetric History OB History  Gravida Para Term Preterm AB Living  SAB IAB Ectopic Multiple Live Births               # Outcome Date GA Lbr Len/2nd Weight Sex Delivery Anes PTL Lv  1 Para              The following portions of the patient's history were reviewed and updated as appropriate: allergies, current medications, past family history, past medical history, past social history, past surgical history, and problem list.  Review of Systems Pertinent items noted in HPI and remainder of comprehensive ROS otherwise negative.   Past medical history, past surgical history, family history and social history were all reviewed and documented in the EPIC chart.   Exam:  Vitals:   05/02/22 1434  BP: 102/78   There is no height or weight on file to calculate BMI.  Physical Exam Constitutional:      Appearance: Normal appearance. She is normal weight.  Pulmonary:     Effort: Pulmonary effort is normal.  Neurological:     General: No focal deficit present.     Mental Status: She is alert and oriented to person, place, and time.  Psychiatric:        Mood and Affect: Mood normal.        Thought Content: Thought content normal.        Judgment: Judgment normal.      Assessment/Plan:   1. Hormone replacement therapy (HRT) - progesterone (PROMETRIUM) 100 MG capsule; Take 1 capsule (100 mg total) by mouth daily.  Dispense: 90 capsule; Refill: 3 - estradiol (VIVELLE-DOT) 0.05 MG/24HR patch; Place 1 patch (0.05 mg total) onto the skin 2 (two) times a week.  Dispense: 24 patch; Refill: 3    AEX due 11/2022  Arlie Solomons B WHNP-BC 2:45 PM 05/02/2022

## 2022-05-05 ENCOUNTER — Other Ambulatory Visit: Payer: Self-pay

## 2022-05-05 ENCOUNTER — Other Ambulatory Visit: Payer: Self-pay | Admitting: Family

## 2022-05-05 ENCOUNTER — Other Ambulatory Visit (HOSPITAL_COMMUNITY): Payer: Self-pay

## 2022-05-05 MED ORDER — ONDANSETRON 4 MG PO TBDP
4.0000 mg | ORAL_TABLET | Freq: Three times a day (TID) | ORAL | 0 refills | Status: DC | PRN
  Filled 2022-05-05: qty 30, 10d supply, fill #0

## 2022-05-07 ENCOUNTER — Encounter: Payer: 59 | Admitting: Rehabilitative and Restorative Service Providers"

## 2022-05-19 ENCOUNTER — Other Ambulatory Visit: Payer: Self-pay

## 2022-05-19 ENCOUNTER — Other Ambulatory Visit (HOSPITAL_COMMUNITY): Payer: Self-pay

## 2022-05-19 ENCOUNTER — Other Ambulatory Visit: Payer: Self-pay | Admitting: Family Medicine

## 2022-05-19 DIAGNOSIS — B354 Tinea corporis: Secondary | ICD-10-CM

## 2022-05-19 MED ORDER — NYSTATIN 100000 UNIT/GM EX POWD
1.0000 | Freq: Three times a day (TID) | CUTANEOUS | 3 refills | Status: DC
Start: 2022-05-19 — End: 2023-04-12
  Filled 2022-05-19: qty 60, 20d supply, fill #0
  Filled 2022-05-19: qty 60, 30d supply, fill #0
  Filled 2022-12-17: qty 60, 15d supply, fill #1
  Filled 2023-02-23: qty 60, 15d supply, fill #2
  Filled 2023-03-26: qty 60, 15d supply, fill #3

## 2022-05-21 ENCOUNTER — Other Ambulatory Visit: Payer: Self-pay | Admitting: Family Medicine

## 2022-05-21 DIAGNOSIS — F411 Generalized anxiety disorder: Secondary | ICD-10-CM

## 2022-05-22 ENCOUNTER — Other Ambulatory Visit (HOSPITAL_COMMUNITY): Payer: Self-pay

## 2022-05-22 MED ORDER — ALPRAZOLAM 0.25 MG PO TABS
0.2500 mg | ORAL_TABLET | Freq: Three times a day (TID) | ORAL | 1 refills | Status: DC | PRN
Start: 2022-05-22 — End: 2022-07-07
  Filled 2022-05-22: qty 30, 10d supply, fill #0
  Filled 2022-06-10: qty 30, 10d supply, fill #1

## 2022-05-22 NOTE — Telephone Encounter (Signed)
Requesting: alprazolam 0.25mg  Contract: 05/13/21 UDS: 04/18/19 Last Visit: 11/11/21 Next Visit: None Last Refill: 04/15/22 #30 and 0RF   Please Advise

## 2022-05-23 ENCOUNTER — Other Ambulatory Visit (HOSPITAL_COMMUNITY): Payer: Self-pay

## 2022-05-31 ENCOUNTER — Other Ambulatory Visit: Payer: Self-pay | Admitting: Family Medicine

## 2022-05-31 DIAGNOSIS — E119 Type 2 diabetes mellitus without complications: Secondary | ICD-10-CM

## 2022-06-02 ENCOUNTER — Other Ambulatory Visit (HOSPITAL_COMMUNITY): Payer: Self-pay

## 2022-06-02 ENCOUNTER — Other Ambulatory Visit: Payer: Self-pay

## 2022-06-02 MED ORDER — METFORMIN HCL 850 MG PO TABS
850.0000 mg | ORAL_TABLET | Freq: Two times a day (BID) | ORAL | 0 refills | Status: DC
Start: 2022-06-02 — End: 2022-07-07
  Filled 2022-06-02: qty 60, 30d supply, fill #0

## 2022-06-10 ENCOUNTER — Other Ambulatory Visit: Payer: Self-pay

## 2022-06-27 ENCOUNTER — Ambulatory Visit: Payer: 59 | Admitting: Family

## 2022-06-30 ENCOUNTER — Ambulatory Visit: Payer: 59 | Admitting: Family Medicine

## 2022-07-07 ENCOUNTER — Other Ambulatory Visit: Payer: Self-pay | Admitting: Orthopedic Surgery

## 2022-07-07 ENCOUNTER — Other Ambulatory Visit: Payer: Self-pay | Admitting: Family Medicine

## 2022-07-07 ENCOUNTER — Ambulatory Visit: Payer: 59 | Admitting: Family Medicine

## 2022-07-07 ENCOUNTER — Other Ambulatory Visit (HOSPITAL_COMMUNITY): Payer: Self-pay

## 2022-07-07 ENCOUNTER — Other Ambulatory Visit: Payer: Self-pay

## 2022-07-07 DIAGNOSIS — E119 Type 2 diabetes mellitus without complications: Secondary | ICD-10-CM

## 2022-07-07 DIAGNOSIS — F411 Generalized anxiety disorder: Secondary | ICD-10-CM

## 2022-07-07 MED ORDER — METFORMIN HCL 850 MG PO TABS
850.0000 mg | ORAL_TABLET | Freq: Two times a day (BID) | ORAL | 0 refills | Status: DC
Start: 2022-07-07 — End: 2022-08-04
  Filled 2022-07-07: qty 60, 30d supply, fill #0

## 2022-07-07 NOTE — Telephone Encounter (Signed)
Appt later today.

## 2022-07-07 NOTE — Telephone Encounter (Signed)
Requesting: alprazolam 0.25mg   Contract: 05/13/21 UDS:04/18/19 Last Visit: 11/11/21 Next Visit: 07/11/22  Last Refill: 05/22/22 #30 and 0RF   Please Advise

## 2022-07-08 ENCOUNTER — Other Ambulatory Visit: Payer: Self-pay

## 2022-07-08 ENCOUNTER — Other Ambulatory Visit (HOSPITAL_COMMUNITY): Payer: Self-pay

## 2022-07-08 MED ORDER — ALPRAZOLAM 0.25 MG PO TABS
0.2500 mg | ORAL_TABLET | Freq: Three times a day (TID) | ORAL | 1 refills | Status: DC | PRN
Start: 2022-07-08 — End: 2022-08-17
  Filled 2022-07-08: qty 30, 10d supply, fill #0
  Filled 2022-07-21: qty 30, 10d supply, fill #1

## 2022-07-08 MED ORDER — ONDANSETRON 4 MG PO TBDP
4.0000 mg | ORAL_TABLET | Freq: Three times a day (TID) | ORAL | 0 refills | Status: DC | PRN
  Filled 2022-07-08: qty 30, 10d supply, fill #0

## 2022-07-09 ENCOUNTER — Other Ambulatory Visit (HOSPITAL_COMMUNITY): Payer: Self-pay

## 2022-07-11 ENCOUNTER — Encounter: Payer: Self-pay | Admitting: Family Medicine

## 2022-07-11 ENCOUNTER — Telehealth (INDEPENDENT_AMBULATORY_CARE_PROVIDER_SITE_OTHER): Payer: 59 | Admitting: Family Medicine

## 2022-07-11 DIAGNOSIS — E785 Hyperlipidemia, unspecified: Secondary | ICD-10-CM

## 2022-07-11 DIAGNOSIS — I10 Essential (primary) hypertension: Secondary | ICD-10-CM

## 2022-07-11 NOTE — Progress Notes (Signed)
MyChart Video Visit    Virtual Visit via Video Note   This patient is at least at moderate risk for complications without adequate follow up. This format is felt to be most appropriate for this patient at this time. Physical exam was limited by quality of the video and audio technology used for the visit. [name] was able to get the patient set up on a video visit.  Patient location: home Patient and provider in visit Provider location: Office  I discussed the limitations of evaluation and management by telemedicine and the availability of in person appointments. The patient expressed understanding and agreed to proceed.  Visit Date: 07/11/2022  Today's healthcare provider: Donato Schultz, DO     Subjective:    Patient ID: Cheryl Hart, female    DOB: [birth year], 47 y.o.   MRN: [id number]  No chief complaint on file.   HPIatient is in today for f/u bp.   Discussed the use of AI scribe software for clinical note transcription with the patient, who gave verbal consent to proceed.  History of Present Illness   The patient presents for a routine follow-up and medication review. They report no new health issues since their last appointment in October. They have been taking two antihypertensive medications, losartan and metoprolol, and have not checked their blood pressure at home since April. However, their blood pressure readings were within normal limits during visits to their OB/GYN in November and April. They also see Dr. Huston Foley for diabetes management. The patient reports no need for medication refills at this time.      Past Medical History:  Diagnosis Date   Anemia    Anxiety    Arthritis    KNEE RIGHT   Blood transfusion without reported diagnosis    Depression    Diabetes mellitus    History of kidney stones    Hyperlipidemia    Hypertension    PT.DENIES,MEDS FOR KIDNEY AND PALPATIONS   PCOS (polycystic ovarian syndrome)    Uterine fibroid    Vitamin  B12 deficiency     Past Surgical History:  Procedure Laterality Date   CESAREAN SECTION     CHOLECYSTECTOMY     g1 p1     LAPAROSCOPIC GELPORT ASSISTED MYOMECTOMY  02/12/2015   Dr Bonnielee Haff   TOTAL KNEE ARTHROPLASTY Right 03/05/2022   Procedure: RIGHT TOTAL KNEE ARTHROPLASTY;  Surgeon: Nadara Mustard, MD;  Location: [facility] OR;  Service: Orthopedics;  Laterality: Right;    Family History  Problem Relation Age of Onset   Crohn's disease Mother    Diabetes Mother    Hypertension Mother    Hyperlipidemia Mother    Kidney disease Mother    Thyroid disease Mother    Anxiety disorder Mother    Diabetes Father    Hypertension Father    Anxiety disorder Father    Liver disease Father    Alcoholism Father    Drug abuse Father    Depression Father    Diabetes Brother    Migraines Other    Colon cancer Neg Hx    Colon polyps Neg Hx    Esophageal cancer Neg Hx    Rectal cancer Neg Hx    Stomach cancer Neg Hx    Ulcerative colitis Neg Hx     Social History   Socioeconomic History   Marital status: Single    Spouse name: Not on file   Number of children: Not on file   Years of education:  Not on file   Highest education level: Not on file  Occupational History   Occupation: psych tech  Tobacco Use   Smoking status: Never    Passive exposure: Never   Smokeless tobacco: Never  Vaping Use   Vaping Use: Never used  Substance and Sexual Activity   Alcohol use: No   Drug use: No   Sexual activity: Not Currently    Partners: Male    Birth control/protection: Post-menopausal    Comment: menarche 47yo, sexual debut 47yo  Other Topics Concern   Not on file  Social History Narrative   Exercise-- no   Social Determinants of Health   Financial Resource Strain: Not on file  Food Insecurity: No Food Insecurity (03/05/2022)   Hunger Vital Sign    Worried About Running Out of Food in the Last Year: Never true    Ran Out of Food in the Last Year: Never true  Transportation Needs: No  Transportation Needs (03/05/2022)   PRAPARE - Administrator, Civil Service (Medical): No    Lack of Transportation (Non-Medical): No  Physical Activity: Not on file  Stress: Not on file  Social Connections: Not on file  Intimate Partner Violence: Not At Risk (03/05/2022)   Humiliation, Afraid, Rape, and Kick questionnaire    Fear of Current or Ex-Partner: No    Emotionally Abused: No    Physically Abused: No    Sexually Abused: No    Outpatient Medications Prior to Visit  Medication Sig Dispense Refill   ALPRAZolam (XANAX) 0.25 MG tablet Take 1 tablet (0.25 mg total) by mouth 3 (three) times daily as needed. 30 tablet 1   Biotin 5000 MCG TABS Take 5,000 mcg by mouth every 14 (fourteen) days.     diphenhydrAMINE (BENADRYL) 25 MG tablet Take 50 mg by mouth at bedtime as needed for sleep.      DULoxetine (CYMBALTA) 60 MG capsule Take 1 capsule (60 mg total) by mouth daily. 90 capsule 0   estradiol (VIVELLE-DOT) 0.05 MG/24HR patch Place 1 patch (0.05 mg total) onto the skin 2 (two) times a week. 24 patch 3   ezetimibe (ZETIA) 10 MG tablet Take 1 tablet (10 mg total) by mouth daily. 30 tablet 2   ferrous sulfate 325 (65 FE) MG EC tablet Take 325 mg by mouth daily.     glipiZIDE (GLUCOTROL) 5 MG tablet Take 1.5 tablets (7.5 mg total) by mouth 2 (two) times daily before a meal. 270 tablet 1   glucose blood (FREESTYLE LITE) test strip Use as instructed to check blood sugar 2 times daily 100 each 5   ibuprofen (ADVIL) 600 MG tablet Take 1 tablet (600 mg total) by mouth every 6 (six) hours as needed. 30 tablet 2   losartan (COZAAR) 25 MG tablet Take 1 tablet (25 mg total) by mouth daily. 90 tablet 1   metFORMIN (GLUCOPHAGE) 850 MG tablet Take 1 tablet (850 mg total) by mouth 2 (two) times daily with a meal. 60 tablet 0   metoprolol succinate (TOPROL-XL) 50 MG 24 hr tablet Take 1 tablet (50 mg total) by mouth daily. Take with or immediately following a meal. 90 tablet 3   Multiple  Vitamin (MULTIVITAMIN WITH MINERALS) TABS tablet Take 1 tablet by mouth daily.     nystatin [facility]) powder Apply 1 application topically 3 (three) times daily. 60 g 3   ondansetron (ZOFRAN-ODT) 4 MG disintegrating tablet Dissolve 1 tablet (4 mg total) in mouth every 8 (eight) hours  as needed for nausea or vomiting. 30 tablet 0   progesterone (PROMETRIUM) 100 MG capsule Take 1 capsule (100 mg total) by mouth daily. 90 capsule 3   rosuvastatin (CRESTOR) 40 MG tablet Take 1 tablet (40 mg total) by mouth daily. 30 tablet 2   Vitamin D, Ergocalciferol, (DRISDOL) 1.25 MG (50000 UNIT) CAPS capsule Take 1 capsule (50,000 Units total) by mouth every 7 (seven) days. 12 capsule 1   Blood Glucose Monitoring Suppl (BLOOD GLUCOSE MONITOR SYSTEM) w/Device KIT use as drected to check blood sugar (Patient not taking: Reported on 03/12/2022) 1 kit 0   Dulaglutide (TRULICITY) 1.5 MG/0.5ML SOPN Inject 1.5 mg into the skin once a week. (Patient not taking: Reported on 05/02/2022) 6 mL 3   No facility-administered medications prior to visit.    Allergies  Allergen Reactions   Sulfa Antibiotics Other (See Comments) and Rash   Sulfonamide Derivatives Photosensitivity, Rash and Other (See Comments)    Severe headache    Review of Systems  Constitutional:  Negative for fever and malaise/fatigue.  HENT:  Negative for congestion.   Eyes:  Negative for blurred vision.  Respiratory:  Negative for cough and shortness of breath.   Cardiovascular:  Negative for chest pain, palpitations and leg swelling.  Gastrointestinal:  Negative for vomiting.  Musculoskeletal:  Negative for back pain.  Skin:  Negative for rash.  Neurological:  Negative for loss of consciousness and headaches.       Objective:    Physical Exam Vitals and nursing note reviewed.  Constitutional:      General: She is not in acute distress.    Appearance: Normal appearance. She is well-developed.  Pulmonary:     Effort: Pulmonary effort is  normal.  Neurological:     Mental Status: She is alert and oriented to person, place, and time.  Psychiatric:        Mood and Affect: Mood normal.        Behavior: Behavior normal.        Thought Content: Thought content normal.        Judgment: Judgment normal.     There were no vitals taken for this visit. Wt Readings from Last 3 Encounters:  03/05/22 204 lb (92.5 kg)  02/20/22 204 lb (92.5 kg)  01/17/22 192 lb (87.1 kg)       Assessment & Plan:  Essential hypertension Assessment & Plan: Well controlled, no changes to meds. Encouraged heart healthy diet such as the DASH diet and exercise as tolerated.    Orders: -     CBC with Differential/Platelet; Future -     Comprehensive metabolic panel; Future -     Lipid panel; Future -     TSH; Future  Hyperlipidemia, unspecified hyperlipidemia type -     CBC with Differential/Platelet; Future -     Comprehensive metabolic panel; Future -     Lipid panel; Future -     TSH; Future  Primary hypertension Assessment & Plan: Well controlled, no changes to meds. Encouraged heart healthy diet such as the DASH diet and exercise as tolerated.     Dyslipidemia Assessment & Plan: Encourage heart healthy diet such as MIND or DASH diet, increase exercise, avoid trans fats, simple carbohydrates and processed foods, consider a krill or fish or flaxseed oil cap daily.      Assessment and Plan    Hypertension: Controlled on Losartan and Metoprolol. Last recorded blood pressure in April was 102/78. -Continue current medications. -Check blood pressure  at home.  General Health Maintenance: -Order labs for cholesterol check. -Schedule physical for October/November. -Continue diabetes management with Dr. Huston Foley.        I discussed the assessment and treatment plan with the patient. The patient was provided an opportunity to ask questions and all were answered. The patient agreed with the plan and demonstrated an understanding of the  instructions.   The patient was advised to call back or seek an in-person evaluation if the symptoms worsen or if the condition fails to improve as anticipated.  Donato Schultz, DO [facility] at [facility] [phone] (phone) [phone] (fax)  [facility]

## 2022-07-11 NOTE — Assessment & Plan Note (Signed)
Well controlled, no changes to meds. Encouraged heart healthy diet such as the DASH diet and exercise as tolerated.  °

## 2022-07-11 NOTE — Assessment & Plan Note (Signed)
Encourage heart healthy diet such as MIND or DASH diet, increase exercise, avoid trans fats, simple carbohydrates and processed foods, consider a krill or fish or flaxseed oil cap daily.  °

## 2022-07-18 ENCOUNTER — Ambulatory Visit: Payer: 59 | Admitting: Family

## 2022-07-21 ENCOUNTER — Other Ambulatory Visit: Payer: Self-pay | Admitting: Family Medicine

## 2022-07-21 DIAGNOSIS — Z79890 Hormone replacement therapy (postmenopausal): Secondary | ICD-10-CM

## 2022-07-21 DIAGNOSIS — F339 Major depressive disorder, recurrent, unspecified: Secondary | ICD-10-CM

## 2022-07-21 DIAGNOSIS — E1142 Type 2 diabetes mellitus with diabetic polyneuropathy: Secondary | ICD-10-CM

## 2022-07-22 ENCOUNTER — Other Ambulatory Visit (HOSPITAL_COMMUNITY): Payer: Self-pay

## 2022-07-22 ENCOUNTER — Other Ambulatory Visit: Payer: Self-pay

## 2022-07-22 MED ORDER — DULOXETINE HCL 60 MG PO CPEP
60.0000 mg | ORAL_CAPSULE | Freq: Every day | ORAL | 0 refills | Status: DC
Start: 2022-07-22 — End: 2022-10-22
  Filled 2022-07-22: qty 30, 30d supply, fill #0
  Filled 2022-08-17 – 2022-08-19 (×2): qty 30, 30d supply, fill #1
  Filled 2022-09-07 – 2022-09-26 (×4): qty 30, 30d supply, fill #2

## 2022-07-22 MED ORDER — ESTRADIOL 0.075 MG/24HR TD PTTW
1.0000 | MEDICATED_PATCH | TRANSDERMAL | 0 refills | Status: DC
Start: 2022-07-24 — End: 2022-10-22
  Filled 2022-07-22: qty 8, 28d supply, fill #0
  Filled 2022-08-17 – 2022-08-19 (×2): qty 8, 28d supply, fill #1
  Filled 2022-09-19 – 2022-09-26 (×3): qty 8, 28d supply, fill #2

## 2022-07-22 NOTE — Telephone Encounter (Signed)
Ok to increase to vivelle dot 0.075mg  3 mos supply, needs a BP check appt in 3 months

## 2022-07-22 NOTE — Telephone Encounter (Signed)
Last AEX 11/26/2021--recall placed for 2024.

## 2022-07-31 ENCOUNTER — Other Ambulatory Visit: Payer: Self-pay | Admitting: Internal Medicine

## 2022-07-31 DIAGNOSIS — E119 Type 2 diabetes mellitus without complications: Secondary | ICD-10-CM

## 2022-08-01 ENCOUNTER — Other Ambulatory Visit: Payer: Self-pay

## 2022-08-01 ENCOUNTER — Other Ambulatory Visit (HOSPITAL_COMMUNITY): Payer: Self-pay

## 2022-08-04 ENCOUNTER — Other Ambulatory Visit (HOSPITAL_COMMUNITY): Payer: Self-pay

## 2022-08-04 ENCOUNTER — Other Ambulatory Visit: Payer: Self-pay | Admitting: Internal Medicine

## 2022-08-04 ENCOUNTER — Other Ambulatory Visit: Payer: Self-pay

## 2022-08-04 ENCOUNTER — Other Ambulatory Visit: Payer: Self-pay | Admitting: Family Medicine

## 2022-08-04 ENCOUNTER — Telehealth: Payer: Self-pay | Admitting: Internal Medicine

## 2022-08-04 DIAGNOSIS — E119 Type 2 diabetes mellitus without complications: Secondary | ICD-10-CM

## 2022-08-04 DIAGNOSIS — Z1231 Encounter for screening mammogram for malignant neoplasm of breast: Secondary | ICD-10-CM

## 2022-08-04 DIAGNOSIS — I10 Essential (primary) hypertension: Secondary | ICD-10-CM

## 2022-08-04 MED ORDER — METFORMIN HCL 850 MG PO TABS
850.0000 mg | ORAL_TABLET | Freq: Two times a day (BID) | ORAL | 1 refills | Status: DC
  Filled 2022-08-04: qty 60, 30d supply, fill #0
  Filled 2022-09-07: qty 60, 30d supply, fill #1

## 2022-08-04 MED ORDER — LOSARTAN POTASSIUM 25 MG PO TABS
25.0000 mg | ORAL_TABLET | Freq: Every day | ORAL | 1 refills | Status: DC
  Filled 2022-08-04: qty 30, 30d supply, fill #0
  Filled 2022-09-07: qty 30, 30d supply, fill #1
  Filled 2022-10-22: qty 30, 30d supply, fill #2
  Filled 2022-11-25: qty 30, 30d supply, fill #3
  Filled 2022-12-24: qty 30, 30d supply, fill #4
  Filled 2023-02-02: qty 30, 30d supply, fill #5

## 2022-08-04 NOTE — Telephone Encounter (Signed)
MEDICATION: Trulicity  PHARMACY:  [facility]  [address] [place] [address]   HAS THE PATIENT CONTACTED THEIR PHARMACY?  No  IS THIS A 90 DAY SUPPLY : Yes  IS PATIENT OUT OF MEDICATION: Yes IF NOT; HOW MUCH IS LEFT:   LAST APPOINTMENT DATE: @8 /04/2020  NEXT APPOINTMENT DATE:@8 /29/2024  DO WE HAVE YOUR PERMISSION TO LEAVE A DETAILED MESSAGE?: Yes  OTHER COMMENTS: Patient states that Trulicity is no longer on her medication list & pharmacy told her to contact office   **Let patient know to contact pharmacy at the end of the day to make sure medication is ready. **  ** Please notify patient to allow 48-72 hours to process**  **Encourage patient to contact the pharmacy for refills or they can request refills through [facility]**

## 2022-08-05 ENCOUNTER — Other Ambulatory Visit (HOSPITAL_COMMUNITY): Payer: Self-pay

## 2022-08-05 ENCOUNTER — Ambulatory Visit
Admission: RE | Admit: 2022-08-05 | Discharge: 2022-08-05 | Disposition: A | Discharge status: Home / Self Care | Payer: 59 | Source: Ambulatory Visit

## 2022-08-05 ENCOUNTER — Encounter (HOSPITAL_COMMUNITY): Payer: Self-pay | Admitting: Pharmacist

## 2022-08-05 DIAGNOSIS — Z1231 Encounter for screening mammogram for malignant neoplasm of breast: Secondary | ICD-10-CM

## 2022-08-05 MED ORDER — TRULICITY 1.5 MG/0.5ML ~~LOC~~ SOAJ
1.5000 mg | SUBCUTANEOUS | 1 refills | Status: DC
  Filled 2022-08-05: qty 2, 28d supply, fill #0
  Filled 2022-09-07: qty 2, 28d supply, fill #1

## 2022-08-05 NOTE — Telephone Encounter (Signed)
Patient has not been seen since 08/2020 and requesting the Trulicity be sent to [facility] pharmacy. Patient has upcoming appointment on 09/11/22. Is it okay to send.

## 2022-08-06 ENCOUNTER — Other Ambulatory Visit (HOSPITAL_COMMUNITY): Payer: Self-pay

## 2022-08-06 ENCOUNTER — Other Ambulatory Visit: Payer: Self-pay

## 2022-08-06 DIAGNOSIS — E119 Type 2 diabetes mellitus without complications: Secondary | ICD-10-CM

## 2022-08-06 MED ORDER — GLIPIZIDE 5 MG PO TABS
7.5000 mg | ORAL_TABLET | Freq: Two times a day (BID) | ORAL | 0 refills | Status: DC
Start: 2022-08-06 — End: 2022-09-11
  Filled 2022-08-06: qty 90, 30d supply, fill #0
  Filled 2022-09-07: qty 90, 30d supply, fill #1

## 2022-08-17 ENCOUNTER — Other Ambulatory Visit: Payer: Self-pay | Admitting: Family Medicine

## 2022-08-17 DIAGNOSIS — F411 Generalized anxiety disorder: Secondary | ICD-10-CM

## 2022-08-18 ENCOUNTER — Other Ambulatory Visit: Payer: Self-pay

## 2022-08-19 ENCOUNTER — Other Ambulatory Visit: Payer: Self-pay

## 2022-08-19 ENCOUNTER — Other Ambulatory Visit (HOSPITAL_COMMUNITY): Payer: Self-pay

## 2022-08-20 ENCOUNTER — Other Ambulatory Visit (HOSPITAL_COMMUNITY): Payer: Self-pay

## 2022-08-20 MED ORDER — VITAMIN D (ERGOCALCIFEROL) 1.25 MG (50000 UNIT) PO CAPS
50000.0000 [IU] | ORAL_CAPSULE | ORAL | 1 refills | Status: DC
  Filled 2022-08-20: qty 4, 28d supply, fill #0
  Filled 2022-09-07 – 2022-09-26 (×4): qty 4, 28d supply, fill #1
  Filled 2022-10-22: qty 4, 28d supply, fill #2
  Filled 2022-11-17: qty 4, 28d supply, fill #3
  Filled 2022-12-17: qty 4, 28d supply, fill #4
  Filled 2023-01-22: qty 4, 28d supply, fill #5

## 2022-08-20 NOTE — Telephone Encounter (Signed)
Requesting: Xanax  Contract: 2021 UDS: 2021  Last OV: 07/11/22 v/v Next OV: N/A Last Refill: 07/08/22, #30--0 RF Database:   Please advise

## 2022-08-21 ENCOUNTER — Other Ambulatory Visit (HOSPITAL_COMMUNITY): Payer: Self-pay

## 2022-08-21 MED ORDER — ALPRAZOLAM 0.25 MG PO TABS
0.2500 mg | ORAL_TABLET | Freq: Three times a day (TID) | ORAL | 1 refills | Status: DC | PRN
Start: 2022-08-21 — End: 2022-09-24
  Filled 2022-08-21: qty 30, 10d supply, fill #0
  Filled 2022-09-04: qty 30, 10d supply, fill #1

## 2022-08-22 ENCOUNTER — Other Ambulatory Visit: Payer: Self-pay | Admitting: Orthopedic Surgery

## 2022-08-22 ENCOUNTER — Other Ambulatory Visit (HOSPITAL_COMMUNITY): Payer: Self-pay

## 2022-08-23 ENCOUNTER — Other Ambulatory Visit: Payer: Self-pay | Admitting: Orthopedic Surgery

## 2022-08-25 ENCOUNTER — Other Ambulatory Visit (HOSPITAL_COMMUNITY): Payer: Self-pay

## 2022-08-25 MED ORDER — ONDANSETRON 4 MG PO TBDP
4.0000 mg | ORAL_TABLET | Freq: Three times a day (TID) | ORAL | 0 refills | Status: DC | PRN
  Filled 2022-08-25: qty 30, 10d supply, fill #0

## 2022-08-26 ENCOUNTER — Ambulatory Visit: Payer: 59 | Admitting: Family

## 2022-09-04 ENCOUNTER — Other Ambulatory Visit: Payer: Self-pay

## 2022-09-04 ENCOUNTER — Other Ambulatory Visit (HOSPITAL_COMMUNITY): Payer: Self-pay

## 2022-09-08 ENCOUNTER — Other Ambulatory Visit: Payer: Self-pay

## 2022-09-08 ENCOUNTER — Other Ambulatory Visit (HOSPITAL_COMMUNITY): Payer: Self-pay

## 2022-09-11 ENCOUNTER — Ambulatory Visit: Payer: 59 | Admitting: Internal Medicine

## 2022-09-11 ENCOUNTER — Encounter: Payer: Self-pay | Admitting: Internal Medicine

## 2022-09-11 ENCOUNTER — Other Ambulatory Visit (HOSPITAL_COMMUNITY): Payer: Self-pay

## 2022-09-11 VITALS — BP 110/70 | HR 100 | Ht 66.5 in | Wt 187.6 lb

## 2022-09-11 DIAGNOSIS — Z7984 Long term (current) use of oral hypoglycemic drugs: Secondary | ICD-10-CM | POA: Diagnosis not present

## 2022-09-11 DIAGNOSIS — E785 Hyperlipidemia, unspecified: Secondary | ICD-10-CM | POA: Diagnosis not present

## 2022-09-11 DIAGNOSIS — Z7985 Long-term (current) use of injectable non-insulin antidiabetic drugs: Secondary | ICD-10-CM

## 2022-09-11 DIAGNOSIS — E119 Type 2 diabetes mellitus without complications: Secondary | ICD-10-CM | POA: Diagnosis not present

## 2022-09-11 LAB — POCT GLYCOSYLATED HEMOGLOBIN (HGB A1C): Hemoglobin A1C: 5.7 % — AB (ref 4.0–5.6)

## 2022-09-11 LAB — POCT GLUCOSE (DEVICE FOR HOME USE): Glucose Fasting, POC: 124 mg/dL — AB (ref 70–99)

## 2022-09-11 MED ORDER — TRULICITY 3 MG/0.5ML ~~LOC~~ SOAJ
3.0000 mg | SUBCUTANEOUS | 3 refills | Status: DC
Start: 2022-09-11 — End: 2023-01-08
  Filled 2022-09-11: qty 2, 28d supply, fill #0
  Filled 2022-09-11: qty 6, 84d supply, fill #0
  Filled 2022-10-13: qty 2, 28d supply, fill #1
  Filled 2022-11-04: qty 2, 28d supply, fill #2
  Filled 2022-12-08: qty 2, 28d supply, fill #3
  Filled 2023-01-02: qty 2, 28d supply, fill #4

## 2022-09-11 MED ORDER — METFORMIN HCL 850 MG PO TABS
850.0000 mg | ORAL_TABLET | Freq: Two times a day (BID) | ORAL | 2 refills | Status: DC
Start: 2022-09-11 — End: 2023-01-08
  Filled 2022-09-11: qty 60, 30d supply, fill #0
  Filled 2022-10-13: qty 60, 30d supply, fill #1
  Filled 2022-11-17: qty 60, 30d supply, fill #2
  Filled 2022-12-22: qty 60, 30d supply, fill #3

## 2022-09-11 MED ORDER — GLIPIZIDE 5 MG PO TABS
5.0000 mg | ORAL_TABLET | Freq: Every day | ORAL | 2 refills | Status: DC
Start: 2022-09-11 — End: 2023-01-08
  Filled 2022-09-11: qty 30, 30d supply, fill #0
  Filled 2022-09-11: qty 90, 90d supply, fill #0
  Filled 2022-09-11: qty 30, 30d supply, fill #0
  Filled 2022-10-13: qty 30, 30d supply, fill #1
  Filled 2022-11-17: qty 30, 30d supply, fill #2
  Filled 2022-12-09 – 2022-12-24 (×3): qty 30, 30d supply, fill #3

## 2022-09-11 NOTE — Patient Instructions (Signed)
-   Decrease  Glipizide 5  mg to ONE tablet before Breakfast only  - Continue Metformin 850 mg Twice daily  - Increase Trulicity 3 mg weekly     Please take Rosuvastatin 20 mg daily     HOW TO TREAT LOW BLOOD SUGARS (Blood sugar LESS THAN 70 MG/DL) Please follow the RULE OF 15 for the treatment of hypoglycemia treatment (when your (blood sugars are less than 70 mg/dL)   STEP 1: Take 15 grams of carbohydrates when your blood sugar is low, which includes:  3-4 GLUCOSE TABS  OR 3-4 OZ OF JUICE OR REGULAR SODA OR ONE TUBE OF GLUCOSE GEL    STEP 2: RECHECK blood sugar in 15 MINUTES STEP 3: If your blood sugar is still low at the 15 minute recheck --> then, go back to STEP 1 and treat AGAIN with another 15 grams of carbohydrates.

## 2022-09-11 NOTE — Progress Notes (Signed)
Name: Cheryl Hart  Age/ Sex: 47 y.o., female   MRN/ DOB: [id number], 12/03/1975     PCP: Zola Button, Grayling Congress, DO   Reason for Endocrinology Evaluation: Type 2 Diabetes Mellitus  Initial Endocrine Consultative Visit: 12/01/2018    PATIENT IDENTIFIER: [name] is a 47 y.o. female with a past medical history of HTN, T2DM, and dyslipidemia  . The patient has followed with Endocrinology clinic since 12/01/2018 for consultative assistance with management of her diabetes.  DIABETIC HISTORY:  Cheryl Hart was diagnosed with DM in 2010. She has tried invokana but didn't;t feel good on it , has been unable to afford Januvia or victoza in the past.  Her hemoglobin A1c has ranged from 7.4% in 2018, peaking at 11.1% in 2019.   On her initial visit to our clinic she had an A1c of 9.0%  , she was on Glimepiride and metformin . 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   Today (09/11/2022): Cheryl Hart is here for a f/u on diabetes.  She  has NOT been to our clinic in 2 years. She checks glucose 0 x daily.    She had nausea for ~ 2 mnths after knee sx in 02/2022 but no vomiting  Denies constipation or diarrhea  She has not been consistent with statin intake    HOME DIABETES REGIMEN:  Glipizide 5 mg, 1.5 tabs BID Metformin 850 mg Twice daily  Trulicity 1.5 mg weekly (Monday)     Statin: Yes ACE-I/ARB: yes    METER DOWNLOAD SUMMARY: Did not bring    DIABETIC COMPLICATIONS: Microvascular complications:    Denies: CKD, retinopathy , neuropathy  Last eye exam: Completed 9/ 2023   Macrovascular complications:    Denies: CAD, PVD, CVA   HISTORY:  Past Medical History:  Past Medical History:  Diagnosis Date   Anemia    Anxiety    Arthritis     KNEE RIGHT   Blood transfusion without reported diagnosis    Depression    Diabetes mellitus    History of kidney stones    Hyperlipidemia    Hypertension    PT.DENIES,MEDS FOR KIDNEY AND PALPATIONS   PCOS (polycystic ovarian syndrome)    Uterine fibroid    Vitamin B12 deficiency    Past Surgical History:  Past Surgical History:  Procedure Laterality Date   CESAREAN SECTION     CHOLECYSTECTOMY     g1 p1     LAPAROSCOPIC GELPORT ASSISTED MYOMECTOMY  02/12/2015   Dr Bonnielee Haff   TOTAL KNEE ARTHROPLASTY Right 03/05/2022   Procedure: RIGHT TOTAL KNEE ARTHROPLASTY;  Surgeon: Nadara Mustard, MD;  Location: [facility] OR;  Service: Orthopedics;  Laterality: Right;   Social History:  reports that she has never smoked. She has never been exposed to tobacco smoke. She has never used smokeless tobacco. She reports that she does not drink alcohol and does not use drugs. Family History:  Family History  Problem Relation Age of Onset   Crohn's disease Mother    Diabetes Mother    Hypertension Mother    Hyperlipidemia Mother    Kidney disease Mother    Thyroid disease Mother    Anxiety disorder Mother    Diabetes Father  Hypertension Father    Anxiety disorder Father    Liver disease Father    Alcoholism Father    Drug abuse Father    Depression Father    Breast cancer Maternal Grandmother 64   Diabetes Brother    Migraines Other    Colon cancer Neg Hx    Colon polyps Neg Hx    Esophageal cancer Neg Hx    Rectal cancer Neg Hx    Stomach cancer Neg Hx    Ulcerative colitis Neg Hx      HOME MEDICATIONS: Allergies as of 09/11/2022       Reactions   Sulfa Antibiotics Other (See Comments), Rash   Sulfonamide Derivatives Photosensitivity, Rash, Other (See Comments)   Severe headache        Medication List        Accurate as of September 11, 2022  8:13 AM. If you have any questions, ask your nurse or doctor.          STOP taking these medications    Trulicity 1.5 MG/0.5ML  Sopn Generic drug: Dulaglutide Replaced by: Trulicity 3 MG/0.5ML Sopn Stopped by: Johnney Ou Hanh Kertesz       TAKE these medications    ALPRAZolam 0.25 MG tablet Commonly known as: XANAX Take 1 tablet (0.25 mg total) by mouth 3 (three) times daily as needed.   Biotin 5000 MCG Tabs Take 5,000 mcg by mouth every 14 (fourteen) days.   Blood Glucose Monitor System w/Device Kit use as drected to check blood sugar   diphenhydrAMINE 25 MG tablet Commonly known as: BENADRYL Take 50 mg by mouth at bedtime as needed for sleep.   DULoxetine 60 MG capsule Commonly known as: CYMBALTA Take 1 capsule (60 mg total) by mouth daily.   estradiol 0.075 MG/24HR Commonly known as: VIVELLE-DOT Place 1 patch onto the skin 2 (two) times a week.   ezetimibe 10 MG tablet Commonly known as: Zetia Take 1 tablet (10 mg total) by mouth daily.   ferrous sulfate 325 (65 FE) MG EC tablet Take 325 mg by mouth daily.   FREESTYLE LITE test strip Generic drug: glucose blood Use as instructed to check blood sugar 2 times daily   glipiZIDE 5 MG tablet Commonly known as: GLUCOTROL Take 1 tablet (5 mg total) by mouth daily before breakfast. What changed:  how much to take when to take this Changed by: Johnney Ou Shanetra Blumenstock   ibuprofen 600 MG tablet Commonly known as: ADVIL Take 1 tablet (600 mg total) by mouth every 6 (six) hours as needed.   losartan 25 MG tablet Commonly known as: COZAAR Take 1 tablet (25 mg total) by mouth daily.   metFORMIN 850 MG tablet Commonly known as: GLUCOPHAGE Take 1 tablet (850 mg total) by mouth 2 (two) times daily with a meal.   metoprolol succinate 50 MG 24 hr tablet Commonly known as: TOPROL-XL Take 1 tablet (50 mg total) by mouth daily. Take with or immediately following a meal.   multivitamin with minerals Tabs tablet Take 1 tablet by mouth daily.   Nyamyc powder Generic drug: nystatin Apply 1 application topically 3 (three) times daily.   ondansetron  4 MG disintegrating tablet Commonly known as: ZOFRAN-ODT Dissolve 1 tablet (4 mg total) in mouth every 8 (eight) hours as needed for nausea or vomiting.   progesterone 100 MG capsule Commonly known as: PROMETRIUM Take 1 capsule (100 mg total) by mouth daily.   rosuvastatin 40 MG tablet Commonly known as: CRESTOR Take 1  tablet (40 mg total) by mouth daily.   Trulicity 3 MG/0.5ML Sopn Generic drug: Dulaglutide Inject 3 mg as directed once a week. Replaces: Trulicity 1.5 MG/0.5ML Sopn Started by: Johnney Ou Yanixan Mellinger   Vitamin D (Ergocalciferol) 1.25 MG (50000 UNIT) Caps capsule Commonly known as: DRISDOL Take 1 capsule (50,000 Units total) by mouth every 7 (seven) days.         OBJECTIVE:   Vital Signs: BP 110/70 (BP Location: Left Arm, Patient Position: Sitting, Cuff Size: Large)   Pulse 100   Ht 5' 6.5" (1.689 m)   Wt 187 lb 9.6 oz (85.1 kg)   SpO2 98%   BMI 29.83 kg/m   Wt Readings from Last 3 Encounters:  09/11/22 187 lb 9.6 oz (85.1 kg)  03/05/22 204 lb (92.5 kg)  02/20/22 204 lb (92.5 kg)     Exam: General: Pt appears well and is in NAD  Lungs: Clear with good BS bilat with no rales, rhonchi, or wheezes  Heart: RRR with normal S1 and S2 and no gallops; no murmurs; no rub  Abdomen: Normoactive bowel sounds, soft, nontender, without masses or organomegaly palpable  Extremities: No pretibial edema.   Neuro: MS is good with appropriate affect, pt is alert and Ox3    DM foot exam: 09/11/2022   The skin of the feet is without sores or ulcerations The pedal pulses are 2+ on right and 2+ on left. The sensation is intact to a screening 5.07, 10 gram monofilament bilaterally    DATA REVIEWED:  Lab Results  Component Value Date   HGBA1C 5.7 (A) 09/11/2022   HGBA1C 6.3 (H) 02/20/2022   HGBA1C 6.5 11/11/2021      ASSESSMENT / PLAN / RECOMMENDATIONS:    1) Type 2 Diabetes Mellitus, Optimally controlled,  Without complications - Most recent A1c of 5.7 %.  Goal A1c < 7.0 %.    - A1c is 5.7% which concerns me that she may have hypoglycemia, while on glipizide, I had decreased her glipizide when I last saw her 2 years ago, but somehow she has been taking 1.5 tablets twice daily based on old recommendations -Patient encouraged to check glucose at home -I will decrease glipizide and increase Trulicity as below    MEDICATIONS: - Decrease Glipizide 5mg , 1 tablet before breakfast - Continue Metformin 850 mg Twice daily  -Increase Trulicity 3 mg weekly    EDUCATION / INSTRUCTIONS: BG monitoring instructions: Patient is instructed to check her blood sugars 3 times a week Call [facility] if: BG persistently < 70  I reviewed the Rule of 15 for the treatment of hypoglycemia in detail with the patient. Literature supplied.   2) Diabetic complications:  Eye: Does not have known diabetic retinopathy. Pt urged to have an updated eye exam  Neuro/ Feet: Does not have known diabetic peripheral neuropathy. Renal: Patient does not have known baseline CKD. She is on an ACEI/ARB at present.   3) Dyslipidemia :  -She continues with imperfect adherence to statin therapy, LDL has been above goal, we again emphasized the importance of statin therapy in reducing the risk of CVA/CAD in the setting of DM, and I have encouraged her to take this on a daily basis -Patient states she does not need a refill   Medication    Rosuvastatin 20 mg daily    F/U in 4 months    Signed electronically by: Lyndle Herrlich, MD  [facility] Endocr[facility]ology  [facility] [address] [place]  [address] [place], [place] [address] Phone: [phone] FAX: [phone]   CC: Donato Schultz, DO [address] [place] [address] Phone: [phone]  Fax: [phone]  Return to Endocrinology clinic as below: Future Appointments  Date Time Provider Department Center  09/12/2022  9:15 AM Adonis Huguenin, NP [facility] None

## 2022-09-12 ENCOUNTER — Encounter: Payer: Self-pay | Admitting: Family

## 2022-09-12 ENCOUNTER — Other Ambulatory Visit (INDEPENDENT_AMBULATORY_CARE_PROVIDER_SITE_OTHER): Payer: 59

## 2022-09-12 ENCOUNTER — Ambulatory Visit: Payer: 59 | Admitting: Family

## 2022-09-12 DIAGNOSIS — M25561 Pain in right knee: Secondary | ICD-10-CM | POA: Diagnosis not present

## 2022-09-12 NOTE — Progress Notes (Signed)
Office Visit Note   Patient: Cheryl Hart           Date of Birth: 12-24-1975           MRN: [id number] Visit Date: 09/12/2022              Requested by: Zola Button, [place], [place] [address] [place],  [place] [address] PCP: Donato Schultz, DO  Chief Complaint  Patient presents with   Right Knee - Follow-up    Hx right TKA 03/05/2022      HPI: The patient is a 47 year old woman who is seen today for concern of fullness tightness aching pain to her right knee.  She is status post right total knee arthroplasty in February of this year.  She has not had any recent injuries she does work seated at a desk.  She notes that she often has stiffness when she gets up from prolonged sitting.  No warmth or mechanical symptoms  Assessment & Plan: Visit Diagnoses:  1. Acute pain of right knee     Plan: Reassurance provided.  Discussed VMO strengthening.  The patient will continue with home exercise program May use anti-inflammatories.  She will follow-up in the office as needed.  Follow-Up Instructions: Return if symptoms worsen or fail to improve.   Right Knee Exam   Muscle Strength  The patient has normal right knee strength.  Tenderness  The patient is experiencing no tenderness.   Range of Motion  The patient has normal right knee ROM.  Tests  Varus: negative Valgus: negative  Other  Erythema: absent Sensation: normal Swelling: mild Effusion: no effusion present      Patient is alert, oriented, no adenopathy, well-dressed, normal affect, normal respiratory effort.   Imaging: XR Knee 1-2 Views Right  Result Date: 09/12/2022 Radiographs of the right knee show stable alignment of total knee arthroplasty hardware.  No sign of loosening or complicating feature.  No images are attached to the encounter.  Labs: Lab Results  Component Value Date   HGBA1C 5.7 (A) 09/11/2022   HGBA1C 6.3 (H) 02/20/2022   HGBA1C 6.5 11/11/2021   REPTSTATUS  12/23/2016 FINAL 12/21/2016   CULT >=100,000 COLONIES/mL ESCHERICHIA COLI (A) 12/21/2016   LABORGA ESCHERICHIA COLI (A) 12/21/2016     Lab Results  Component Value Date   ALBUMIN 4.1 11/11/2021   ALBUMIN 4.3 05/13/2021   ALBUMIN 4.0 08/14/2020    No results found for: "MG" Lab Results  Component Value Date   VD25OH 27.47 (L) 11/11/2021   VD25OH 18.1 (L) 02/09/2019    No results found for: "PREALBUMIN"    Latest Ref Rng & Units 02/20/2022    2:06 PM 11/11/2021    9:26 AM 05/13/2021    2:25 PM  CBC EXTENDED  WBC 4.0 - 10.5 K/uL 8.1  5.6  6.7   RBC 3.87 - 5.11 MIL/uL 4.19  4.02  4.11   Hemoglobin 12.0 - 15.0 g/dL 40.1  02.7  25.3   HCT 36.0 - 46.0 % 33.6  31.1  33.0   Platelets 150 - 400 K/uL 517  447.0  468.0   NEUT# 1.4 - 7.7 K/uL  2.8  4.3   Lymph# 0.7 - 4.0 K/uL  2.1  1.9      There is no height or weight on file to calculate BMI.  Orders:  Orders Placed This Encounter  Procedures   XR Knee 1-2 Views Right   No orders of the  defined types were placed in this encounter.    Procedures: No procedures performed  Clinical Data: No additional findings.  ROS:  All other systems negative, except as noted in the HPI. Review of Systems  Objective: Vital Signs: There were no vitals taken for this visit.  Specialty Comments:  No specialty comments available.  PMFS History: Patient Active Problem List   Diagnosis Date Noted   Long-term (current) use of injectable non-insulin antidiabetic drugs 09/11/2022   Long term current use of oral hypoglycemic drug 09/11/2022   Unilateral primary osteoarthritis, right knee 03/05/2022   Total knee replacement status, right 03/05/2022   Palpitations 11/11/2021   Essential hypertension 11/11/2021   Depression 09/21/2020   PCOS (polycystic ovarian syndrome) 09/21/2020   Vitamin B12 deficiency 09/21/2020   Type 2 diabetes mellitus without complication, without long-term current use of insulin (HCC) 08/16/2020    Preventative health care 08/14/2020   Type 2 diabetes mellitus with hyperglycemia, without long-term current use of insulin (HCC) 04/13/2020   Toe pain, bilateral 04/13/2020   Generalized anxiety disorder 04/18/2019   Diabetic polyneuropathy associated with type 2 diabetes mellitus (HCC) 04/18/2019   Anemia    Dyslipidemia 12/01/2018   Tachycardia 12/01/2018   HTN (hypertension) 10/01/2018   Fibroids    Fibroid, uterine    Obesity (BMI 30-39.9) 02/21/2013   Hyperlipidemia 03/13/2011   MORBID OBESITY 08/08/2008   Uncontrolled type 2 diabetes mellitus with complication, without long-term current use of insulin 03/31/2008   UNSPECIFIED ANEMIA 03/31/2008   GLYCOSURIA 03/29/2008   LIVER FUNCTION TESTS, ABNORMAL, HX OF 02/28/2007   ABDOMINAL PAIN 02/17/2007   ANXIETY 11/18/2006   GESTATIONAL DIABETES 11/18/2006   FATIGUE 11/18/2006   HEADACHE 11/18/2006   ABDOMINAL PAIN, EPIGASTRIC 11/18/2006   Past Medical History:  Diagnosis Date   Anemia    Anxiety    Arthritis    KNEE RIGHT   Blood transfusion without reported diagnosis    Depression    Diabetes mellitus    History of kidney stones    Hyperlipidemia    Hypertension    PT.DENIES,MEDS FOR KIDNEY AND PALPATIONS   PCOS (polycystic ovarian syndrome)    Uterine fibroid    Vitamin B12 deficiency     Family History  Problem Relation Age of Onset   Crohn's disease Mother    Diabetes Mother    Hypertension Mother    Hyperlipidemia Mother    Kidney disease Mother    Thyroid disease Mother    Anxiety disorder Mother    Diabetes Father    Hypertension Father    Anxiety disorder Father    Liver disease Father    Alcoholism Father    Drug abuse Father    Depression Father    Breast cancer Maternal Grandmother 18   Diabetes Brother    Migraines Other    Colon cancer Neg Hx    Colon polyps Neg Hx    Esophageal cancer Neg Hx    Rectal cancer Neg Hx    Stomach cancer Neg Hx    Ulcerative colitis Neg Hx     Past Surgical  History:  Procedure Laterality Date   CESAREAN SECTION     CHOLECYSTECTOMY     g1 p1     LAPAROSCOPIC GELPORT ASSISTED MYOMECTOMY  02/12/2015   Dr Bonnielee Haff   TOTAL KNEE ARTHROPLASTY Right 03/05/2022   Procedure: RIGHT TOTAL KNEE ARTHROPLASTY;  Surgeon: Nadara Mustard, MD;  Location: [facility] OR;  Service: Orthopedics;  Laterality: Right;   Social History  Occupational History   Occupation: Artist  Tobacco Use   Smoking status: Never    Passive exposure: Never   Smokeless tobacco: Never  Vaping Use   Vaping status: Never Used  Substance and Sexual Activity   Alcohol use: No   Drug use: No   Sexual activity: Not Currently    Partners: Male    Birth control/protection: Post-menopausal    Comment: menarche 47yo, sexual debut 47yo

## 2022-09-19 ENCOUNTER — Other Ambulatory Visit: Payer: Self-pay

## 2022-09-19 ENCOUNTER — Other Ambulatory Visit: Payer: Self-pay | Admitting: Orthopedic Surgery

## 2022-09-19 ENCOUNTER — Other Ambulatory Visit (HOSPITAL_COMMUNITY): Payer: Self-pay

## 2022-09-19 MED ORDER — ONDANSETRON 4 MG PO TBDP
4.0000 mg | ORAL_TABLET | Freq: Three times a day (TID) | ORAL | 0 refills | Status: DC | PRN
  Filled 2022-09-19 – 2022-09-26 (×3): qty 30, 10d supply, fill #0

## 2022-09-24 ENCOUNTER — Other Ambulatory Visit: Payer: Self-pay | Admitting: Family Medicine

## 2022-09-24 DIAGNOSIS — F411 Generalized anxiety disorder: Secondary | ICD-10-CM

## 2022-09-25 ENCOUNTER — Other Ambulatory Visit (HOSPITAL_COMMUNITY): Payer: Self-pay

## 2022-09-25 MED ORDER — ALPRAZOLAM 0.25 MG PO TABS
0.2500 mg | ORAL_TABLET | Freq: Three times a day (TID) | ORAL | 1 refills | Status: DC | PRN
Start: 2022-09-25 — End: 2022-11-04
  Filled 2022-09-25 – 2022-09-26 (×3): qty 30, 10d supply, fill #0
  Filled 2022-10-13: qty 30, 10d supply, fill #1

## 2022-09-25 NOTE — Telephone Encounter (Signed)
Requesting: alprazolam 0.25mg   Contract: 05/13/21 UDS: 04/18/19 Last Visit: 07/11/22 Next Visit: None Last Refill: 08/21/22 #30 and 0RF   Please Advise

## 2022-09-26 ENCOUNTER — Other Ambulatory Visit (HOSPITAL_COMMUNITY): Payer: Self-pay

## 2022-10-13 ENCOUNTER — Other Ambulatory Visit (HOSPITAL_COMMUNITY): Payer: Self-pay

## 2022-10-14 ENCOUNTER — Other Ambulatory Visit: Payer: Self-pay

## 2022-10-22 ENCOUNTER — Other Ambulatory Visit: Payer: Self-pay | Admitting: Family Medicine

## 2022-10-22 ENCOUNTER — Other Ambulatory Visit: Payer: Self-pay | Admitting: Radiology

## 2022-10-22 ENCOUNTER — Other Ambulatory Visit: Payer: Self-pay

## 2022-10-22 ENCOUNTER — Other Ambulatory Visit: Payer: Self-pay | Admitting: Family

## 2022-10-22 ENCOUNTER — Other Ambulatory Visit (HOSPITAL_COMMUNITY): Payer: Self-pay

## 2022-10-22 DIAGNOSIS — E1142 Type 2 diabetes mellitus with diabetic polyneuropathy: Secondary | ICD-10-CM

## 2022-10-22 DIAGNOSIS — Z79890 Hormone replacement therapy (postmenopausal): Secondary | ICD-10-CM

## 2022-10-22 DIAGNOSIS — F339 Major depressive disorder, recurrent, unspecified: Secondary | ICD-10-CM

## 2022-10-22 MED ORDER — ESTRADIOL 0.075 MG/24HR TD PTTW
1.0000 | MEDICATED_PATCH | TRANSDERMAL | 0 refills | Status: DC
Start: 2022-10-23 — End: 2023-02-09
  Filled 2022-10-22 – 2022-11-04 (×2): qty 8, 28d supply, fill #0
  Filled 2022-12-08: qty 8, 28d supply, fill #1
  Filled 2023-01-02: qty 8, 28d supply, fill #2

## 2022-10-22 MED ORDER — DULOXETINE HCL 60 MG PO CPEP
60.0000 mg | ORAL_CAPSULE | Freq: Every day | ORAL | 0 refills | Status: DC
Start: 2022-10-22 — End: 2023-01-22
  Filled 2022-10-22: qty 30, 30d supply, fill #0
  Filled 2022-11-25: qty 30, 30d supply, fill #1
  Filled 2022-12-24: qty 30, 30d supply, fill #2

## 2022-10-22 NOTE — Telephone Encounter (Signed)
Med refill request: estradiol 0.075mg  patch #24 Last AEX: 11/26/21 Next AEX: none scheduled  Last MMG (if hormonal med) 08/05/22 Refill authorized: estradiol 0.075mg  patch #24, no refills.  Sent to provider for review.

## 2022-10-24 ENCOUNTER — Other Ambulatory Visit (HOSPITAL_COMMUNITY): Payer: Self-pay

## 2022-11-03 ENCOUNTER — Other Ambulatory Visit (HOSPITAL_COMMUNITY): Payer: Self-pay

## 2022-11-04 ENCOUNTER — Other Ambulatory Visit: Payer: Self-pay

## 2022-11-04 ENCOUNTER — Other Ambulatory Visit: Payer: Self-pay | Admitting: Family

## 2022-11-04 ENCOUNTER — Other Ambulatory Visit (HOSPITAL_COMMUNITY): Payer: Self-pay

## 2022-11-04 ENCOUNTER — Other Ambulatory Visit: Payer: Self-pay | Admitting: Family Medicine

## 2022-11-04 DIAGNOSIS — F411 Generalized anxiety disorder: Secondary | ICD-10-CM

## 2022-11-04 MED ORDER — ALPRAZOLAM 0.25 MG PO TABS
0.2500 mg | ORAL_TABLET | Freq: Three times a day (TID) | ORAL | 1 refills | Status: DC | PRN
Start: 2022-11-04 — End: 2022-12-08
  Filled 2022-11-04: qty 30, 10d supply, fill #0
  Filled 2022-11-25: qty 30, 10d supply, fill #1

## 2022-11-04 NOTE — Telephone Encounter (Signed)
Requesting: alprazolam 0.25mg   Contract:06/04/21 UDS: 02/09/2017 Last Visit: 07/11/22 Next Visit: None Last Refill: 09/25/22 #30 and 0RF   Please Advise

## 2022-11-05 ENCOUNTER — Other Ambulatory Visit: Payer: Self-pay

## 2022-11-05 ENCOUNTER — Other Ambulatory Visit (HOSPITAL_COMMUNITY): Payer: Self-pay

## 2022-11-05 MED ORDER — ONDANSETRON 4 MG PO TBDP
4.0000 mg | ORAL_TABLET | Freq: Three times a day (TID) | ORAL | 0 refills | Status: DC | PRN
  Filled 2022-11-05: qty 30, 10d supply, fill #0

## 2022-11-25 ENCOUNTER — Other Ambulatory Visit (HOSPITAL_COMMUNITY): Payer: Self-pay

## 2022-12-08 ENCOUNTER — Other Ambulatory Visit (HOSPITAL_COMMUNITY): Payer: Self-pay

## 2022-12-08 ENCOUNTER — Other Ambulatory Visit: Payer: Self-pay | Admitting: Family Medicine

## 2022-12-08 DIAGNOSIS — F411 Generalized anxiety disorder: Secondary | ICD-10-CM

## 2022-12-08 MED ORDER — ALPRAZOLAM 0.25 MG PO TABS
0.2500 mg | ORAL_TABLET | Freq: Three times a day (TID) | ORAL | 1 refills | Status: DC | PRN
Start: 2022-12-08 — End: 2023-01-02
  Filled 2022-12-08: qty 30, 10d supply, fill #0
  Filled 2022-12-22: qty 30, 10d supply, fill #1

## 2022-12-08 NOTE — Telephone Encounter (Signed)
Requesting: alprazolam 0.25mg   Contract: 06/04/21 UDS: 04/18/19 Last Visit: 07/11/22 Next Visit: None Last Refill: 11/04/22 #30 and 0RF   Please Advise

## 2022-12-09 ENCOUNTER — Other Ambulatory Visit: Payer: Self-pay | Admitting: Family

## 2022-12-09 ENCOUNTER — Other Ambulatory Visit: Payer: Self-pay

## 2022-12-09 ENCOUNTER — Other Ambulatory Visit (HOSPITAL_COMMUNITY): Payer: Self-pay

## 2022-12-09 MED ORDER — ONDANSETRON 4 MG PO TBDP
4.0000 mg | ORAL_TABLET | Freq: Three times a day (TID) | ORAL | 0 refills | Status: DC | PRN
  Filled 2022-12-09: qty 30, 10d supply, fill #0

## 2022-12-10 ENCOUNTER — Other Ambulatory Visit: Payer: Self-pay

## 2022-12-17 ENCOUNTER — Other Ambulatory Visit (HOSPITAL_COMMUNITY): Payer: Self-pay

## 2022-12-17 ENCOUNTER — Other Ambulatory Visit: Payer: Self-pay

## 2022-12-22 ENCOUNTER — Other Ambulatory Visit (HOSPITAL_COMMUNITY): Payer: Self-pay

## 2022-12-22 ENCOUNTER — Other Ambulatory Visit: Payer: Self-pay

## 2022-12-23 ENCOUNTER — Other Ambulatory Visit (HOSPITAL_COMMUNITY): Payer: Self-pay

## 2022-12-31 NOTE — Progress Notes (Signed)
Name: Cheryl Hart  Age/ Sex: 47 y.o., female   MRN/ DOB: [id number], 07-15-1975     PCP: Zola Button, Grayling Congress, DO   Reason for Endocrinology Evaluation: Type 2 Diabetes Mellitus  Initial Endocrine Consultative Visit: 12/01/2018    PATIENT IDENTIFIER: [name] is a 47 y.o. female with a past medical history of HTN, T2DM, and dyslipidemia  . The patient has followed with Endocrinology clinic since 12/01/2018 for consultative assistance with management of her diabetes.  DIABETIC HISTORY:  Ms. Hoecker was diagnosed with DM in 2010. She has tried invokana but didn't;t feel good on it , has been unable to afford Januvia or victoza in the past.  Her hemoglobin A1c has ranged from 7.4% in 2018, peaking at 11.1% in 2019.   On her initial visit to our clinic she had an A1c of 9.0%  , she was on Glimepiride and metformin . 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   Today (01/08/2023): [name] is here for a f/u on diabetes.She checks glucose 0 x daily.    Denies nausea or vomiting  Denies constipation or diarrhea   She continues to take Crestor on intermittent basis twice week, makes her feel nauseous and decrease appetite    HOME DIABETES REGIMEN:  Glipizide 5 mg, 1 tab daily  Metformin 850 mg Twice daily  Trulicity 3 mg weekly    Statin: Yes ACE-I/ARB: yes    METER DOWNLOAD SUMMARY: n/a   DIABETIC COMPLICATIONS: Microvascular complications:    Denies: CKD, retinopathy , neuropathy  Last eye exam: Completed 09/2022   Macrovascular complications:    Denies: CAD, PVD, CVA   HISTORY:  Past Medical History:  Past Medical History:  Diagnosis Date   Anemia    Anxiety    Arthritis    KNEE RIGHT   Blood transfusion without reported  diagnosis    Depression    Diabetes mellitus    History of kidney stones    Hyperlipidemia    Hypertension    PT.DENIES,MEDS FOR KIDNEY AND PALPATIONS   PCOS (polycystic ovarian syndrome)    Uterine fibroid    Vitamin B12 deficiency    Past Surgical History:  Past Surgical History:  Procedure Laterality Date   CESAREAN SECTION     CHOLECYSTECTOMY     g1 p1     LAPAROSCOPIC [place] ASSISTED MYOMECTOMY  02/12/2015   Dr Bonnielee Haff   TOTAL KNEE ARTHROPLASTY Right 03/05/2022   Procedure: RIGHT TOTAL KNEE ARTHROPLASTY;  Surgeon: Nadara Mustard, MD;  Location: [facility] OR;  Service: Orthopedics;  Laterality: Right;   Social History:  reports that she has never smoked. She has never been exposed to tobacco smoke. She has never used smokeless tobacco. She reports that she does not drink alcohol and does not use drugs. Family History:  Family History  Problem Relation Age of Onset   Crohn's disease Mother    Diabetes Mother    Hypertension Mother    Hyperlipidemia Mother    Kidney disease Mother    Thyroid disease Mother    Anxiety disorder Mother    Diabetes Father    Hypertension Father    Anxiety disorder Father    Liver disease Father    Alcoholism Father  Drug abuse Father    Depression Father    Breast cancer Maternal Grandmother 59   Diabetes Brother    Migraines Other    Colon cancer Neg Hx    Colon polyps Neg Hx    Esophageal cancer Neg Hx    Rectal cancer Neg Hx    Stomach cancer Neg Hx    Ulcerative colitis Neg Hx      HOME MEDICATIONS: Allergies as of 01/08/2023       Reactions   Sulfa Antibiotics Other (See Comments), Rash   Sulfonamide Derivatives Photosensitivity, Rash, Other (See Comments)   Severe headache        Medication List        Accurate as of January 08, 2023  8:02 AM. If you have any questions, ask your nurse or doctor.          ALPRAZolam 0.25 MG tablet Commonly known as: XANAX Take 1 tablet (0.25 mg total) by mouth 3 (three) times  daily as needed.   Biotin 5000 MCG Tabs Take 5,000 mcg by mouth every 14 (fourteen) days.   Blood Glucose Monitor System w/Device Kit use as drected to check blood sugar   diphenhydrAMINE 25 MG tablet Commonly known as: BENADRYL Take 50 mg by mouth at bedtime as needed for sleep.   DULoxetine 60 MG capsule Commonly known as: CYMBALTA Take 1 capsule (60 mg total) by mouth daily.   estradiol 0.075 MG/24HR Commonly known as: VIVELLE-DOT Place 1 patch onto the skin 2 (two) times a week.   ezetimibe 10 MG tablet Commonly known as: Zetia Take 1 tablet (10 mg total) by mouth daily.   ferrous sulfate 325 (65 FE) MG EC tablet Take 325 mg by mouth daily.   FREESTYLE LITE test strip Generic drug: glucose blood Use as instructed to check blood sugar 2 times daily   glipiZIDE 5 MG tablet Commonly known as: GLUCOTROL Take 1 tablet (5 mg total) by mouth daily before breakfast.   ibuprofen 600 MG tablet Commonly known as: ADVIL Take 1 tablet (600 mg total) by mouth every 6 (six) hours as needed.   losartan 25 MG tablet Commonly known as: COZAAR Take 1 tablet (25 mg total) by mouth daily.   metFORMIN 850 MG tablet Commonly known as: GLUCOPHAGE Take 1 tablet (850 mg total) by mouth 2 (two) times daily with a meal.   metoprolol succinate 50 MG 24 hr tablet Commonly known as: TOPROL-XL Take 1 tablet (50 mg total) by mouth daily. Take with or immediately following a meal.   multivitamin with minerals Tabs tablet Take 1 tablet by mouth daily.   Nyamyc powder Generic drug: nystatin Apply 1 application topically 3 (three) times daily.   ondansetron 4 MG disintegrating tablet Commonly known as: ZOFRAN-ODT Dissolve 1 tablet (4 mg total) in mouth every 8 (eight) hours as needed for nausea or vomiting.   progesterone 100 MG capsule Commonly known as: PROMETRIUM Take 1 capsule (100 mg total) by mouth daily.   rosuvastatin 40 MG tablet Commonly known as: CRESTOR Take 1 tablet  (40 mg total) by mouth daily.   Trulicity 3 MG/0.5ML Soaj Generic drug: Dulaglutide Inject 3 mg as directed once a week.   Vitamin D (Ergocalciferol) 1.25 MG (50000 UNIT) Caps capsule Commonly known as: DRISDOL Take 1 capsule (50,000 Units total) by mouth every 7 (seven) days.         OBJECTIVE:   Vital Signs: BP 108/60 (BP Location: Right Arm, Patient Position: Sitting, Cuff Size:  Normal)   Pulse 71   Resp 20   Ht 5' 6.5" (1.689 m)   Wt 179 lb 9.6 oz (81.5 kg)   SpO2 97%   BMI 28.55 kg/m   Wt Readings from Last 3 Encounters:  01/08/23 179 lb 9.6 oz (81.5 kg)  09/11/22 187 lb 9.6 oz (85.1 kg)  03/05/22 204 lb (92.5 kg)     Exam: General: Pt appears well and is in NAD  Lungs: Clear with good BS bilat   Heart: RRR  Abdomen:  soft, nontender  Extremities: No pretibial edema.   Neuro: MS is good with appropriate affect, pt is alert and Ox3    DM foot exam: 09/11/2022   The skin of the feet is without sores or ulcerations The pedal pulses are 2+ on right and 2+ on left. The sensation is intact to a screening 5.07, 10 gram monofilament bilaterally    DATA REVIEWED:  Lab Results  Component Value Date   HGBA1C 5.5 01/08/2023   HGBA1C 5.7 (A) 09/11/2022   HGBA1C 6.3 (H) 02/20/2022    Latest Reference Range & Units 02/20/22 14:00  Potassium 3.5 - 5.1 mmol/L 4.2  Chloride 98 - 111 mmol/L 102  CO2 22 - 32 mmol/L 23  Glucose 70 - 99 mg/dL 528 (H)  Mean Plasma Glucose mg/dL 413.24  BUN 6 - 20 mg/dL 6  Creatinine 4.01 - 0.27 mg/dL 2.53  Calcium 8.9 - 66.4 mg/dL 9.8  Anion gap 5 - 15  11  GFR, Estimated >60 mL/min >60      In office BG 123 Mg/DL  ASSESSMENT / PLAN / RECOMMENDATIONS:    1) Type 2 Diabetes Mellitus, Optimally controlled,  Without complications - Most recent A1c of 5.5 %. Goal A1c < 7.0 %.    -A1c continues to trend down -Annual hypoglycemia, but the patient does not check glucose at home either -Will discontinue glipizide -She would like  to remain on the same dose of Trulicity at this time   MEDICATIONS: - Stop glipizide 5mg , 1 tablet before breakfast - Continue Metformin 850 mg Twice daily  -Continue Trulicity 3 mg weekly    EDUCATION / INSTRUCTIONS: BG monitoring instructions: Patient is instructed to check her blood sugars 3 times a week Call [place] [facility] if: BG persistently < 70  I reviewed the Rule of 15 for the treatment of hypoglycemia in detail with the patient. Literature supplied.   2) Diabetic complications:  Eye: Does not have known diabetic retinopathy. Pt urged to have an updated eye exam  Neuro/ Feet: Does not have known diabetic peripheral neuropathy. Renal: Patient does not have known baseline CKD. She is on an ACEI/ARB at present.   3) Dyslipidemia :  -She continues with imperfect adherence to statin therapy, she attributes nausea and decreased appetite to rosuvastatin, I did explain to the patient that these are associated with Trulicity.  She does not want to decrease the dose of Trulicity at this time -I have again encouraged patient to take rosuvastatin at bedtime with Zetia  Medication    Rosuvastatin 40 mg daily    F/U in 6 months    Signed electronically by: Lyndle Herrlich, MD  [facility] Endocrinology  [facility] [address] [place] [place], [place] [address] Phone: [phone] FAX: [phone]   CC: Donato Schultz, DO [address] [place] [address] Phone: [phone]  Fax: [phone]  Return to Endocrinology clinic as below: No future  appointments.

## 2023-01-02 ENCOUNTER — Other Ambulatory Visit: Payer: Self-pay | Admitting: Family Medicine

## 2023-01-02 DIAGNOSIS — F411 Generalized anxiety disorder: Secondary | ICD-10-CM

## 2023-01-05 ENCOUNTER — Other Ambulatory Visit: Payer: Self-pay

## 2023-01-05 ENCOUNTER — Other Ambulatory Visit (HOSPITAL_COMMUNITY): Payer: Self-pay

## 2023-01-05 MED ORDER — ALPRAZOLAM 0.25 MG PO TABS
0.2500 mg | ORAL_TABLET | Freq: Three times a day (TID) | ORAL | 1 refills | Status: DC | PRN
Start: 2023-01-05 — End: 2023-02-12
  Filled 2023-01-05: qty 30, 10d supply, fill #0
  Filled 2023-01-22: qty 30, 10d supply, fill #1

## 2023-01-05 NOTE — Telephone Encounter (Signed)
Requesting: Xanax Contract: 2021 UDS: 2021 Last OV: 11/11/21 Next OV: n/a Last Refill:  12/08/22, #30--0 rf Database:   Please advise

## 2023-01-08 ENCOUNTER — Ambulatory Visit (INDEPENDENT_AMBULATORY_CARE_PROVIDER_SITE_OTHER): Payer: 59 | Admitting: Internal Medicine

## 2023-01-08 ENCOUNTER — Other Ambulatory Visit (HOSPITAL_COMMUNITY): Payer: Self-pay

## 2023-01-08 VITALS — BP 108/60 | HR 71 | Resp 20 | Ht 66.5 in | Wt 179.6 lb

## 2023-01-08 DIAGNOSIS — E785 Hyperlipidemia, unspecified: Secondary | ICD-10-CM

## 2023-01-08 DIAGNOSIS — Z7985 Long-term (current) use of injectable non-insulin antidiabetic drugs: Secondary | ICD-10-CM | POA: Diagnosis not present

## 2023-01-08 DIAGNOSIS — E119 Type 2 diabetes mellitus without complications: Secondary | ICD-10-CM | POA: Diagnosis not present

## 2023-01-08 LAB — GLUCOSE, POCT (MANUAL RESULT ENTRY): POC Glucose: 123 mg/dL — AB (ref 70–99)

## 2023-01-08 LAB — POCT GLYCOSYLATED HEMOGLOBIN (HGB A1C): Hemoglobin A1C: 5.5 % (ref 4.0–5.6)

## 2023-01-08 MED ORDER — TRULICITY 3 MG/0.5ML ~~LOC~~ SOAJ
3.0000 mg | SUBCUTANEOUS | 3 refills | Status: DC
Start: 2023-01-08 — End: 2023-05-21
  Filled 2023-01-08: qty 6, 84d supply, fill #0
  Filled 2023-01-08: qty 2, 28d supply, fill #0
  Filled 2023-02-02 – 2023-02-09 (×4): qty 2, 28d supply, fill #1
  Filled 2023-03-01: qty 2, 28d supply, fill #2
  Filled 2023-03-26: qty 2, 28d supply, fill #3
  Filled 2023-05-05: qty 2, 28d supply, fill #4

## 2023-01-08 MED ORDER — METFORMIN HCL 850 MG PO TABS
850.0000 mg | ORAL_TABLET | Freq: Two times a day (BID) | ORAL | 2 refills | Status: DC
Start: 2023-01-08 — End: 2023-04-12
  Filled 2023-01-08: qty 180, 90d supply, fill #0
  Filled 2023-01-22: qty 60, 30d supply, fill #0
  Filled 2023-02-23: qty 60, 30d supply, fill #1
  Filled 2023-03-26: qty 60, 30d supply, fill #2

## 2023-01-08 NOTE — Patient Instructions (Signed)
-   Stop Glipizide  - Continue Metformin 850 mg Twice daily  -Continue  Trulicity 3 mg weekly     Please take Rosuvastatin 40 mg daily at bedtime     HOW TO TREAT LOW BLOOD SUGARS (Blood sugar LESS THAN 70 MG/DL) Please follow the RULE OF 15 for the treatment of hypoglycemia treatment (when your (blood sugars are less than 70 mg/dL)   STEP 1: Take 15 grams of carbohydrates when your blood sugar is low, which includes:  3-4 GLUCOSE TABS  OR 3-4 OZ OF JUICE OR REGULAR SODA OR ONE TUBE OF GLUCOSE GEL    STEP 2: RECHECK blood sugar in 15 MINUTES STEP 3: If your blood sugar is still low at the 15 minute recheck --> then, go back to STEP 1 and treat AGAIN with another 15 grams of carbohydrates.

## 2023-01-22 ENCOUNTER — Other Ambulatory Visit (HOSPITAL_COMMUNITY): Payer: Self-pay

## 2023-01-22 ENCOUNTER — Other Ambulatory Visit: Payer: Self-pay

## 2023-01-22 ENCOUNTER — Other Ambulatory Visit: Payer: Self-pay | Admitting: Family

## 2023-01-22 ENCOUNTER — Other Ambulatory Visit: Payer: Self-pay | Admitting: Family Medicine

## 2023-01-22 DIAGNOSIS — E785 Hyperlipidemia, unspecified: Secondary | ICD-10-CM

## 2023-01-22 DIAGNOSIS — F339 Major depressive disorder, recurrent, unspecified: Secondary | ICD-10-CM

## 2023-01-22 DIAGNOSIS — E1142 Type 2 diabetes mellitus with diabetic polyneuropathy: Secondary | ICD-10-CM

## 2023-01-22 MED ORDER — ROSUVASTATIN CALCIUM 40 MG PO TABS
40.0000 mg | ORAL_TABLET | Freq: Every day | ORAL | 0 refills | Status: DC
Start: 2023-01-22 — End: 2023-03-26
  Filled 2023-01-22: qty 30, 30d supply, fill #0

## 2023-01-22 MED ORDER — DULOXETINE HCL 60 MG PO CPEP
60.0000 mg | ORAL_CAPSULE | Freq: Every day | ORAL | 0 refills | Status: DC
Start: 2023-01-22 — End: 2023-02-23
  Filled 2023-01-22: qty 30, 30d supply, fill #0

## 2023-01-23 ENCOUNTER — Other Ambulatory Visit (HOSPITAL_COMMUNITY): Payer: Self-pay

## 2023-01-23 MED ORDER — ONDANSETRON 4 MG PO TBDP
4.0000 mg | ORAL_TABLET | Freq: Three times a day (TID) | ORAL | 0 refills | Status: DC | PRN
  Filled 2023-01-23: qty 30, 10d supply, fill #0

## 2023-02-02 ENCOUNTER — Other Ambulatory Visit: Payer: Self-pay

## 2023-02-02 ENCOUNTER — Other Ambulatory Visit (HOSPITAL_COMMUNITY): Payer: Self-pay

## 2023-02-02 ENCOUNTER — Other Ambulatory Visit: Payer: Self-pay | Admitting: Radiology

## 2023-02-02 ENCOUNTER — Other Ambulatory Visit: Payer: Self-pay | Admitting: Family

## 2023-02-02 DIAGNOSIS — Z79890 Hormone replacement therapy (postmenopausal): Secondary | ICD-10-CM

## 2023-02-02 DIAGNOSIS — F411 Generalized anxiety disorder: Secondary | ICD-10-CM

## 2023-02-04 ENCOUNTER — Encounter (HOSPITAL_COMMUNITY): Payer: Self-pay

## 2023-02-04 ENCOUNTER — Other Ambulatory Visit (HOSPITAL_COMMUNITY): Payer: Self-pay

## 2023-02-04 NOTE — Telephone Encounter (Signed)
Med refill request: estradiol 0.075 mg patch Last AEX: 11/26/21 Next AEX: none scheduled Last MMG (if hormonal med) 08/05/22 Last RF 11/11/22 #24 with note:  needs appointment.  RF denied.  Sent to provider for review.

## 2023-02-05 ENCOUNTER — Other Ambulatory Visit (HOSPITAL_COMMUNITY): Payer: Self-pay

## 2023-02-06 ENCOUNTER — Other Ambulatory Visit (HOSPITAL_COMMUNITY): Payer: Self-pay

## 2023-02-06 ENCOUNTER — Other Ambulatory Visit: Payer: Self-pay | Admitting: Family Medicine

## 2023-02-06 DIAGNOSIS — F411 Generalized anxiety disorder: Secondary | ICD-10-CM

## 2023-02-09 ENCOUNTER — Other Ambulatory Visit (HOSPITAL_COMMUNITY): Payer: Self-pay

## 2023-02-09 ENCOUNTER — Telehealth: Payer: Self-pay

## 2023-02-09 ENCOUNTER — Other Ambulatory Visit: Payer: Self-pay | Admitting: Radiology

## 2023-02-09 ENCOUNTER — Other Ambulatory Visit: Payer: Self-pay | Admitting: Family Medicine

## 2023-02-09 DIAGNOSIS — Z79890 Hormone replacement therapy (postmenopausal): Secondary | ICD-10-CM

## 2023-02-09 DIAGNOSIS — F411 Generalized anxiety disorder: Secondary | ICD-10-CM

## 2023-02-09 MED ORDER — ESTRADIOL 0.075 MG/24HR TD PTTW
1.0000 | MEDICATED_PATCH | TRANSDERMAL | 0 refills | Status: DC
  Filled 2023-02-09: qty 24, 84d supply, fill #0

## 2023-02-09 NOTE — Telephone Encounter (Signed)
Requesting: alprazolam 0.25mg   Contract: 06/04/21 UDS: 04/18/19 Last Visit: 07/11/22 Next Visit: None Last Refill: 01/05/23 #30 and 0RF   Please Advise

## 2023-02-09 NOTE — Telephone Encounter (Signed)
Copied from CRM [id number]. Topic: Clinical - Medication Refill >> Feb 09, 2023  3:31 PM Isabell A wrote: Most Recent Primary Care Visit:  Provider: Seabron Spates R  Department: [facility]  Visit Type: MYCHART VIDEO VISIT  Date: 07/11/2022  Medication: ALPRAZolam Prudy Feeler) 0.25 MG tablet  Has the patient contacted their pharmacy? Yes (Agent: If no, request that the patient contact the pharmacy for the refill. If patient does not wish to contact the pharmacy document the reason why and proceed with request.) (Agent: If yes, when and what did the pharmacy advise?)  Is this the correct pharmacy for this prescription? Yes If no, delete pharmacy and type the correct one.  This is the patient's preferred pharmacy:  [facility] [address] [place] [address] Phone: [phone] Fax: [phone]  Has the prescription been filled recently? Yes  Is the patient out of the medication? Yes  Has the patient been seen for an appointment in the last year OR does the patient have an upcoming appointment? Yes  Can we respond through MyChart? Yes  Agent: Please be advised that Rx refills may take up to 3 business days. We ask that you follow-up with your pharmacy.

## 2023-02-09 NOTE — Telephone Encounter (Signed)
Pharmacy Patient Advocate Encounter   Received notification from Pt Calls Messages that prior authorization for Trulicity is required/requested.   Insurance verification completed.   The patient is insured through [employer] [facility] .   Per test claim: PA required; PA submitted to above mentioned insurance via CoverMyMeds Key/confirmation #/EOC [id number] Status is pending

## 2023-02-09 NOTE — Telephone Encounter (Signed)
Trulicity needs PA

## 2023-02-09 NOTE — Telephone Encounter (Signed)
Med refill request: estradiol 0.075 mg patch twice weekly Last AEX: 11/26/21 -JC Next AEX: 03/19/23 -JC Last MMG (if hormonal med): 08/05/22, BiRads 1 neg Refill authorized: Please Advise?

## 2023-02-10 ENCOUNTER — Other Ambulatory Visit: Payer: Self-pay | Admitting: Family Medicine

## 2023-02-10 ENCOUNTER — Other Ambulatory Visit: Payer: Self-pay | Admitting: Family

## 2023-02-10 ENCOUNTER — Other Ambulatory Visit (HOSPITAL_COMMUNITY): Payer: Self-pay

## 2023-02-10 DIAGNOSIS — F411 Generalized anxiety disorder: Secondary | ICD-10-CM

## 2023-02-11 ENCOUNTER — Encounter (HOSPITAL_COMMUNITY): Payer: Self-pay

## 2023-02-11 ENCOUNTER — Encounter: Payer: Self-pay | Admitting: Family Medicine

## 2023-02-11 ENCOUNTER — Other Ambulatory Visit (HOSPITAL_COMMUNITY): Payer: Self-pay

## 2023-02-11 ENCOUNTER — Other Ambulatory Visit: Payer: Self-pay | Admitting: Family Medicine

## 2023-02-11 DIAGNOSIS — F411 Generalized anxiety disorder: Secondary | ICD-10-CM

## 2023-02-11 NOTE — Telephone Encounter (Signed)
Requesting: alprazolam 0.25mg   Contract: 06/04/21 UDS: 04/18/19 Last Visit: 07/11/22 Next Visit: None Last Refill: 01/05/23 #30 and 0RF   Please Advise

## 2023-02-12 ENCOUNTER — Other Ambulatory Visit (HOSPITAL_COMMUNITY): Payer: Self-pay

## 2023-02-12 ENCOUNTER — Other Ambulatory Visit: Payer: Self-pay | Admitting: Family Medicine

## 2023-02-12 DIAGNOSIS — F411 Generalized anxiety disorder: Secondary | ICD-10-CM

## 2023-02-12 MED ORDER — ALPRAZOLAM 0.25 MG PO TABS
0.2500 mg | ORAL_TABLET | Freq: Three times a day (TID) | ORAL | 1 refills | Status: DC | PRN
Start: 2023-02-12 — End: 2023-03-26
  Filled 2023-02-12: qty 30, 10d supply, fill #0
  Filled 2023-03-01: qty 30, 10d supply, fill #1

## 2023-02-16 ENCOUNTER — Encounter (HOSPITAL_COMMUNITY): Payer: Self-pay

## 2023-02-16 ENCOUNTER — Other Ambulatory Visit (HOSPITAL_COMMUNITY): Payer: Self-pay

## 2023-02-19 ENCOUNTER — Other Ambulatory Visit (HOSPITAL_COMMUNITY): Payer: Self-pay

## 2023-02-19 NOTE — Telephone Encounter (Signed)
 Pharmacy Patient Advocate Encounter  Received notification from [facility] FEP  that Prior Authorization for Trulicity  3mg  has been APPROVED from 01/10/23 to 02/09/24  filled 02/09/23   PA #/Case ID/Reference #: PA Case ID #: [id number]

## 2023-02-23 ENCOUNTER — Other Ambulatory Visit (HOSPITAL_COMMUNITY): Payer: Self-pay

## 2023-02-23 ENCOUNTER — Other Ambulatory Visit: Payer: Self-pay

## 2023-02-23 ENCOUNTER — Other Ambulatory Visit: Payer: Self-pay | Admitting: Family Medicine

## 2023-02-23 DIAGNOSIS — E1142 Type 2 diabetes mellitus with diabetic polyneuropathy: Secondary | ICD-10-CM

## 2023-02-23 DIAGNOSIS — F339 Major depressive disorder, recurrent, unspecified: Secondary | ICD-10-CM

## 2023-02-23 MED ORDER — VITAMIN D (ERGOCALCIFEROL) 1.25 MG (50000 UNIT) PO CAPS
50000.0000 [IU] | ORAL_CAPSULE | ORAL | 1 refills | Status: DC
  Filled 2023-02-23: qty 12, 84d supply, fill #0
  Filled 2023-05-20: qty 12, 84d supply, fill #1

## 2023-02-23 MED ORDER — DULOXETINE HCL 60 MG PO CPEP
60.0000 mg | ORAL_CAPSULE | Freq: Every day | ORAL | 0 refills | Status: DC
Start: 2023-02-23 — End: 2023-03-26
  Filled 2023-02-23: qty 30, 30d supply, fill #0

## 2023-02-24 ENCOUNTER — Telehealth: Payer: Self-pay

## 2023-02-24 ENCOUNTER — Other Ambulatory Visit (HOSPITAL_COMMUNITY): Payer: Self-pay

## 2023-02-24 NOTE — Telephone Encounter (Signed)
PA approved for progesterone 100 mg.  Patient notified.

## 2023-02-24 NOTE — Telephone Encounter (Signed)
Prior authorization received from covermymeds for progesterone 100mg .  PA initiated.  Patient notified. KEY: [id number] Dx [id number] Hormone replacement Member [id number] RX PCN [id number] RX Grp [id number] Darrol Angel [id number]

## 2023-03-01 ENCOUNTER — Other Ambulatory Visit: Payer: Self-pay | Admitting: Family

## 2023-03-02 ENCOUNTER — Other Ambulatory Visit: Payer: Self-pay

## 2023-03-03 ENCOUNTER — Other Ambulatory Visit (HOSPITAL_COMMUNITY): Payer: Self-pay

## 2023-03-03 MED ORDER — ONDANSETRON 4 MG PO TBDP
4.0000 mg | ORAL_TABLET | Freq: Three times a day (TID) | ORAL | 0 refills | Status: DC | PRN
  Filled 2023-03-03: qty 30, 10d supply, fill #0

## 2023-03-14 ENCOUNTER — Other Ambulatory Visit: Payer: Self-pay

## 2023-03-14 ENCOUNTER — Emergency Department (HOSPITAL_BASED_OUTPATIENT_CLINIC_OR_DEPARTMENT_OTHER): Payer: Self-pay

## 2023-03-14 ENCOUNTER — Encounter (HOSPITAL_BASED_OUTPATIENT_CLINIC_OR_DEPARTMENT_OTHER): Payer: Self-pay | Admitting: Emergency Medicine

## 2023-03-14 ENCOUNTER — Emergency Department (HOSPITAL_BASED_OUTPATIENT_CLINIC_OR_DEPARTMENT_OTHER)
Admission: EM | Admit: 2023-03-14 | Discharge: 2023-03-14 | Disposition: A | Discharge status: Home / Self Care | Payer: Self-pay | Attending: Emergency Medicine | Admitting: Emergency Medicine

## 2023-03-14 DIAGNOSIS — T797XXA Traumatic subcutaneous emphysema, initial encounter: Secondary | ICD-10-CM | POA: Diagnosis not present

## 2023-03-14 DIAGNOSIS — Z23 Encounter for immunization: Secondary | ICD-10-CM | POA: Insufficient documentation

## 2023-03-14 DIAGNOSIS — S60512A Abrasion of left hand, initial encounter: Secondary | ICD-10-CM | POA: Diagnosis not present

## 2023-03-14 DIAGNOSIS — S51831A Puncture wound without foreign body of right forearm, initial encounter: Secondary | ICD-10-CM | POA: Diagnosis not present

## 2023-03-14 DIAGNOSIS — W540XXA Bitten by dog, initial encounter: Secondary | ICD-10-CM | POA: Diagnosis not present

## 2023-03-14 DIAGNOSIS — S51852A Open bite of left forearm, initial encounter: Secondary | ICD-10-CM | POA: Diagnosis not present

## 2023-03-14 DIAGNOSIS — S61452A Open bite of left hand, initial encounter: Secondary | ICD-10-CM | POA: Diagnosis not present

## 2023-03-14 MED ORDER — IBUPROFEN 400 MG PO TABS
600.0000 mg | ORAL_TABLET | Freq: Once | ORAL | Status: AC
  Administered 2023-03-14: 600 mg via ORAL
  Filled 2023-03-14: qty 1

## 2023-03-14 MED ORDER — TETANUS-DIPHTH-ACELL PERTUSSIS 5-2.5-18.5 LF-MCG/0.5 IM SUSY
0.5000 mL | PREFILLED_SYRINGE | Freq: Once | INTRAMUSCULAR | Status: AC
  Administered 2023-03-14: 0.5 mL via INTRAMUSCULAR
  Filled 2023-03-14: qty 0.5

## 2023-03-14 MED ORDER — AMOXICILLIN-POT CLAVULANATE 875-125 MG PO TABS
1.0000 | ORAL_TABLET | Freq: Once | ORAL | Status: AC
  Administered 2023-03-14: 1 via ORAL
  Filled 2023-03-14: qty 1

## 2023-03-14 MED ORDER — AMOXICILLIN-POT CLAVULANATE 875-125 MG PO TABS: 1.0000 | ORAL_TABLET | Freq: Two times a day (BID) | ORAL | 0 refills | Status: DC

## 2023-03-14 NOTE — ED Triage Notes (Signed)
 Pt was bitten by a dog that was attacking her dog; her dog is UTD on vaccines, she is not sure about the other dog; bites to RT wrist, LT hand

## 2023-03-14 NOTE — Discharge Instructions (Signed)
 Please read and follow all provided instructions.  Your diagnoses today include:  1. Dog bite, initial encounter    Tests performed today include: Vital signs. See below for your results today.  X-rays of the affected areas do not show any broken bones  Medications prescribed:  Augmentin - antibiotic  You have been prescribed an antibiotic medicine: take the entire course of medicine even if you are feeling better. Stopping early can cause the antibiotic not to work.  Take any prescribed medications only as directed.   Home care instructions:  Follow any educational materials contained in this packet. Keep affected area above the level of your heart when possible. Wash area gently twice a day with warm soapy water. Do not apply alcohol or hydrogen peroxide. Cover the area if it draining or weeping.   Return instructions:  Return to the Emergency Department if you have: Fever Worsening symptoms Worsening pain Worsening swelling Redness of the skin that moves away from the affected area, especially if it streaks away from the affected area  Any other emergent concerns  Your vital signs today were: BP 111/83   Pulse (!) 123   Temp 97.8 F (36.6 C)   Resp (!) 24   Ht 5\' 6"  (1.676 m)   Wt 77.6 kg   SpO2 97%   BMI 27.60 kg/m  If your blood pressure (BP) was elevated above 135/85 this visit, please have this repeated by your doctor within one month. --------------

## 2023-03-14 NOTE — ED Provider Notes (Signed)
 [place] [facility] Provider Note   CSN: [id number] Arrival date & time: 03/14/23  1820     History  Chief Complaint  Patient presents with   Animal Bite    Cheryl Hart is a 48 y.o. female.  Patient presents to the emergency department today for evaluation of dog bite occurring this afternoon.  Patient's neighbor's dog attacked her dog and while trying to break them up, she was bitten on the right forearm and left hand.  Patient has had several superficial abrasions and a small puncture on the right forearm.  She is very sore.  No weakness in the wrists or hands bilaterally.  Unknown last tetanus.  Police were called and report was made.  Dog's location is known.  Unknown rabies vaccine status however.  Patient does have a history of diabetes.  Wounds were cleaned prior to arrival.       Home Medications Prior to Admission medications   Medication Sig Start Date End Date Taking? Authorizing Provider  amoxicillin-clavulanate (AUGMENTIN) 875-125 MG tablet Take 1 tablet by mouth every 12 (twelve) hours. 03/14/23  Yes Renne Crigler, PA-C  ALPRAZolam Prudy Feeler) 0.25 MG tablet Take 1 tablet (0.25 mg total) by mouth 3 (three) times daily as needed. 02/12/23   Donato Schultz, DO  Biotin 5000 MCG TABS Take 5,000 mcg by mouth every 14 (fourteen) days.    [provider]  Blood Glucose Monitoring Suppl (BLOOD GLUCOSE MONITOR SYSTEM) w/Device KIT use as drected to check blood sugar 04/30/20   Shamleffer, Konrad Dolores, MD  diphenhydrAMINE (BENADRYL) 25 MG tablet Take 50 mg by mouth at bedtime as needed for sleep.     [provider]  Dulaglutide (TRULICITY) 3 MG/0.5ML SOAJ Inject 3 mg as directed once a week. 01/08/23   Shamleffer, Konrad Dolores, MD  DULoxetine (CYMBALTA) 60 MG capsule Take 1 capsule (60 mg total) by mouth daily. 02/23/23   Donato Schultz, DO  estradiol (VIVELLE-DOT) 0.075 MG/24HR Place 1 patch onto the skin 2  (two) times a week. 02/09/23   Chrzanowski, Jami B, NP  ezetimibe (ZETIA) 10 MG tablet Take 1 tablet (10 mg total) by mouth daily. 11/25/21   Seabron Spates R, DO  ferrous sulfate 325 (65 FE) MG EC tablet Take 325 mg by mouth daily.    [provider]  glucose blood (FREESTYLE LITE) test strip Use as instructed to check blood sugar 2 times daily 04/30/20   Shamleffer, Konrad Dolores, MD  ibuprofen (ADVIL) 600 MG tablet Take 1 tablet (600 mg total) by mouth every 6 (six) hours as needed. 10/01/18   Donato Schultz, DO  losartan (COZAAR) 25 MG tablet Take 1 tablet (25 mg total) by mouth daily. 08/04/22   Donato Schultz, DO  metFORMIN (GLUCOPHAGE) 850 MG tablet Take 1 tablet (850 mg total) by mouth 2 (two) times daily with a meal. 01/08/23   Shamleffer, Konrad Dolores, MD  metoprolol succinate (TOPROL-XL) 50 MG 24 hr tablet Take 1 tablet (50 mg total) by mouth daily. Take with or immediately following a meal. 11/11/21   [place] Button, Grayling Congress, DO  Multiple Vitamin (MULTIVITAMIN WITH MINERALS) TABS tablet Take 1 tablet by mouth daily.    [provider]  nystatin [facility]) powder Apply 1 application topically 3 (three) times daily. 05/19/22   Seabron Spates R, DO  ondansetron (ZOFRAN-ODT) 4 MG disintegrating tablet Dissolve 1 tablet (4 mg total) in mouth every 8 (  eight) hours as needed for nausea or vomiting. 03/03/23   Adonis Huguenin, NP  progesterone (PROMETRIUM) 100 MG capsule Take 1 capsule (100 mg total) by mouth daily. 05/02/22   Chrzanowski, Jami B, NP  rosuvastatin (CRESTOR) 40 MG tablet Take 1 tablet (40 mg total) by mouth daily. 01/22/23   Donato Schultz, DO  Vitamin D, Ergocalciferol, (DRISDOL) 1.25 MG (50000 UNIT) CAPS capsule Take 1 capsule (50,000 Units total) by mouth every 7 (seven) days. 02/23/23   Donato Schultz, DO      Allergies    Sulfa antibiotics and Sulfonamide derivatives    Review of Systems   Review of Systems  Physical  Exam Updated Vital Signs BP 111/83   Pulse (!) 123   Temp 97.8 F (36.6 C)   Resp (!) 24   Ht 5\' 6"  (1.676 m)   Wt 77.6 kg   SpO2 97%   BMI 27.60 kg/m  Physical Exam Vitals and nursing note reviewed.  Constitutional:      Appearance: She is well-developed.  HENT:     Head: Normocephalic and atraumatic.  Eyes:     Conjunctiva/sclera: Conjunctivae normal.  Pulmonary:     Effort: No respiratory distress.  Musculoskeletal:     Cervical back: Normal range of motion and neck supple.     Comments: Right forearm: There is a small puncture without any bleeding on the volar surface.  Minimal tenderness.  Patient has some soreness with flexion of the wrist, however is able to do so with without difficulty.  Left hand: Several scattered abrasions.  No wounds at location of calcific density on x-ray.  Patient is able to flex and extend all fingers and flex and extend wrist without difficulty.  She does have mild edema.  Skin:    General: Skin is warm and dry.  Neurological:     Mental Status: She is alert.     ED Results / Procedures / Treatments   Labs (all labs ordered are listed, but only abnormal results are displayed) Labs Reviewed - No data to display  EKG None  Radiology DG Hand Complete Left Result Date: 03/14/2023 CLINICAL DATA:  Dog bite to left hand. EXAM: LEFT HAND - COMPLETE 3+ VIEW COMPARISON:  None available FINDINGS: No fracture or dislocation. 2 mm rounded calcific density located between the base of the third and fourth metatarsals may be sequelae of remote trauma. A small foreign body is difficult to exclude. IMPRESSION: No fracture or dislocation. Possible small foreign body located within the palmar aspect of the hand between the third and fourth metatarsals. Electronically Signed   By: Acquanetta Belling M.D.   On: 03/14/2023 19:34   DG Forearm Right Result Date: 03/14/2023 CLINICAL DATA:  Dog bite.  Puncture wound to right distal forearm. EXAM: RIGHT FOREARM - 2 VIEW  COMPARISON:  None available FINDINGS: No fracture or dislocation. No radiopaque foreign body. Mild subcutaneous emphysema of the ventral distal forearm likely site of puncture wound. IMPRESSION: No acute osseous abnormality. Electronically Signed   By: Acquanetta Belling M.D.   On: 03/14/2023 19:31    Procedures Procedures    Medications Ordered in ED Medications  amoxicillin-clavulanate (AUGMENTIN) 875-125 MG per tablet 1 tablet (has no administration in time range)  Tdap (BOOSTRIX) injection 0.5 mL (has no administration in time range)  ibuprofen (ADVIL) tablet 600 mg (has no administration in time range)    ED Course/ Medical Decision Making/ A&P    Patient seen and  examined. History obtained directly from patient.   Labs/EKG: None ordered  Imaging: Independently reviewed and interpreted.  This included: X-ray of the right forearm and left hand, agree negative for fracture.  Medications/Fluids: Ordered: Tdap, Augmentin, ibuprofen  Most recent vital signs reviewed and are as follows: BP 111/83   Pulse (!) 123   Temp 97.8 F (36.6 C)   Resp (!) 24   Ht 5\' 6"  (1.676 m)   Wt 77.6 kg   SpO2 97%   BMI 27.60 kg/m   Initial impression: Superficial bite wounds, puncture wound of the right forearm.  No bony or significant tendon injury suspected.  Home treatment plan: Wound care and close monitoring, 5 days of Augmentin, OTC meds for pain  Return instructions discussed with patient: Pt urged to return with worsening pain, worsening swelling, expanding area of redness or streaking up extremity, fever, or any other concerns.   Follow-up instructions discussed with patient: Follow-up with PCP as needed.                                 Medical Decision Making Amount and/or Complexity of Data Reviewed Radiology: ordered.  Risk Prescription drug management.   Patient with bite wound from a dog.  These appear superficial.  Low concern for deep injury.  X-ray negative.  There is a  small calcification does not correspond to any of the patient's injuries and I think that this is unrelated to dog bite.  Tetanus booster given.  Will give 5 days of Augmentin, especially given location and history of diabetes.  Dog's location is known, can be quarantined, and therefore no indication for rabies prophylaxis today.  Patient looks well.  No indication for emergent orthopedic consultation.        Final Clinical Impression(s) / ED Diagnoses Final diagnoses:  Dog bite, initial encounter    Rx / DC Orders ED Discharge Orders          Ordered    amoxicillin-clavulanate (AUGMENTIN) 875-125 MG tablet  Every 12 hours        03/14/23 2153              Renne Crigler, PA-C 03/14/23 2201    Sloan Leiter, DO 03/15/23 2244

## 2023-03-17 ENCOUNTER — Other Ambulatory Visit: Payer: Self-pay | Admitting: Family Medicine

## 2023-03-17 DIAGNOSIS — I10 Essential (primary) hypertension: Secondary | ICD-10-CM

## 2023-03-18 ENCOUNTER — Other Ambulatory Visit (HOSPITAL_COMMUNITY): Payer: Self-pay

## 2023-03-18 MED ORDER — LOSARTAN POTASSIUM 25 MG PO TABS
25.0000 mg | ORAL_TABLET | Freq: Every day | ORAL | 0 refills | Status: DC
  Filled 2023-03-18: qty 90, 90d supply, fill #0

## 2023-03-19 ENCOUNTER — Ambulatory Visit: Payer: 59 | Admitting: Radiology

## 2023-03-24 ENCOUNTER — Other Ambulatory Visit (HOSPITAL_COMMUNITY): Payer: Self-pay

## 2023-03-24 ENCOUNTER — Other Ambulatory Visit: Payer: Self-pay | Admitting: Family Medicine

## 2023-03-24 ENCOUNTER — Encounter: Payer: Self-pay | Admitting: Family Medicine

## 2023-03-24 MED ORDER — FLUCONAZOLE 150 MG PO TABS
150.0000 mg | ORAL_TABLET | Freq: Every day | ORAL | 0 refills | Status: DC
  Filled 2023-03-24: qty 2, 3d supply, fill #0

## 2023-03-25 ENCOUNTER — Other Ambulatory Visit (HOSPITAL_COMMUNITY): Payer: Self-pay

## 2023-03-26 ENCOUNTER — Other Ambulatory Visit: Payer: Self-pay | Admitting: Family

## 2023-03-26 ENCOUNTER — Other Ambulatory Visit: Payer: Self-pay | Admitting: Family Medicine

## 2023-03-26 DIAGNOSIS — F411 Generalized anxiety disorder: Secondary | ICD-10-CM

## 2023-03-26 DIAGNOSIS — E1142 Type 2 diabetes mellitus with diabetic polyneuropathy: Secondary | ICD-10-CM

## 2023-03-26 DIAGNOSIS — F339 Major depressive disorder, recurrent, unspecified: Secondary | ICD-10-CM

## 2023-03-26 DIAGNOSIS — E785 Hyperlipidemia, unspecified: Secondary | ICD-10-CM

## 2023-03-27 ENCOUNTER — Other Ambulatory Visit: Payer: Self-pay

## 2023-03-27 ENCOUNTER — Other Ambulatory Visit (HOSPITAL_COMMUNITY): Payer: Self-pay

## 2023-03-27 MED ORDER — ALPRAZOLAM 0.25 MG PO TABS
0.2500 mg | ORAL_TABLET | Freq: Three times a day (TID) | ORAL | 1 refills | Status: DC | PRN
Start: 2023-03-27 — End: 2023-05-28
  Filled 2023-03-27: qty 30, 10d supply, fill #0
  Filled 2023-04-26: qty 30, 10d supply, fill #1

## 2023-03-27 MED ORDER — ROSUVASTATIN CALCIUM 40 MG PO TABS
40.0000 mg | ORAL_TABLET | Freq: Every day | ORAL | 0 refills | Status: DC
  Filled 2023-03-27: qty 30, 30d supply, fill #0

## 2023-03-27 MED ORDER — ONDANSETRON 4 MG PO TBDP
4.0000 mg | ORAL_TABLET | Freq: Three times a day (TID) | ORAL | 0 refills | Status: DC | PRN
  Filled 2023-03-27: qty 30, 10d supply, fill #0

## 2023-03-27 MED ORDER — DULOXETINE HCL 60 MG PO CPEP
60.0000 mg | ORAL_CAPSULE | Freq: Every day | ORAL | 0 refills | Status: DC
  Filled 2023-03-27: qty 30, 30d supply, fill #0

## 2023-03-27 NOTE — Telephone Encounter (Signed)
 Requesting: Xanax 0.25 mg Contract: N/A UDS: N/A Last Visit: 07/11/2022 Next Visit: N/A Last Refill: 02/12/2023  Please Advise

## 2023-04-09 ENCOUNTER — Encounter (HOSPITAL_BASED_OUTPATIENT_CLINIC_OR_DEPARTMENT_OTHER): Payer: Self-pay

## 2023-04-09 ENCOUNTER — Inpatient Hospital Stay (HOSPITAL_BASED_OUTPATIENT_CLINIC_OR_DEPARTMENT_OTHER)
Admission: EM | Admit: 2023-04-09 | Discharge: 2023-04-12 | DRG: 683 | Disposition: A | Discharge status: Home / Self Care | Attending: Internal Medicine | Admitting: Internal Medicine

## 2023-04-09 ENCOUNTER — Other Ambulatory Visit: Payer: Self-pay

## 2023-04-09 DIAGNOSIS — D259 Leiomyoma of uterus, unspecified: Secondary | ICD-10-CM | POA: Diagnosis not present

## 2023-04-09 DIAGNOSIS — Z803 Family history of malignant neoplasm of breast: Secondary | ICD-10-CM

## 2023-04-09 DIAGNOSIS — Z882 Allergy status to sulfonamides status: Secondary | ICD-10-CM

## 2023-04-09 DIAGNOSIS — R112 Nausea with vomiting, unspecified: Secondary | ICD-10-CM

## 2023-04-09 DIAGNOSIS — Z8249 Family history of ischemic heart disease and other diseases of the circulatory system: Secondary | ICD-10-CM | POA: Diagnosis not present

## 2023-04-09 DIAGNOSIS — Z87442 Personal history of urinary calculi: Secondary | ICD-10-CM

## 2023-04-09 DIAGNOSIS — Z833 Family history of diabetes mellitus: Secondary | ICD-10-CM | POA: Diagnosis not present

## 2023-04-09 DIAGNOSIS — N858 Other specified noninflammatory disorders of uterus: Secondary | ICD-10-CM | POA: Diagnosis not present

## 2023-04-09 DIAGNOSIS — R000 Tachycardia, unspecified: Secondary | ICD-10-CM | POA: Diagnosis not present

## 2023-04-09 DIAGNOSIS — Z7984 Long term (current) use of oral hypoglycemic drugs: Secondary | ICD-10-CM

## 2023-04-09 DIAGNOSIS — F32A Depression, unspecified: Secondary | ICD-10-CM | POA: Diagnosis present

## 2023-04-09 DIAGNOSIS — Z7982 Long term (current) use of aspirin: Secondary | ICD-10-CM | POA: Diagnosis not present

## 2023-04-09 DIAGNOSIS — M159 Polyosteoarthritis, unspecified: Secondary | ICD-10-CM | POA: Diagnosis not present

## 2023-04-09 DIAGNOSIS — Z83438 Family history of other disorder of lipoprotein metabolism and other lipidemia: Secondary | ICD-10-CM | POA: Diagnosis not present

## 2023-04-09 DIAGNOSIS — R519 Headache, unspecified: Secondary | ICD-10-CM

## 2023-04-09 DIAGNOSIS — E119 Type 2 diabetes mellitus without complications: Secondary | ICD-10-CM | POA: Diagnosis present

## 2023-04-09 DIAGNOSIS — Z96651 Presence of right artificial knee joint: Secondary | ICD-10-CM | POA: Diagnosis not present

## 2023-04-09 DIAGNOSIS — Z818 Family history of other mental and behavioral disorders: Secondary | ICD-10-CM

## 2023-04-09 DIAGNOSIS — I6782 Cerebral ischemia: Secondary | ICD-10-CM | POA: Diagnosis not present

## 2023-04-09 DIAGNOSIS — K219 Gastro-esophageal reflux disease without esophagitis: Secondary | ICD-10-CM | POA: Diagnosis present

## 2023-04-09 DIAGNOSIS — K56609 Unspecified intestinal obstruction, unspecified as to partial versus complete obstruction: Secondary | ICD-10-CM | POA: Diagnosis not present

## 2023-04-09 DIAGNOSIS — I10 Essential (primary) hypertension: Secondary | ICD-10-CM | POA: Diagnosis not present

## 2023-04-09 DIAGNOSIS — E860 Dehydration: Secondary | ICD-10-CM | POA: Diagnosis present

## 2023-04-09 DIAGNOSIS — F419 Anxiety disorder, unspecified: Secondary | ICD-10-CM | POA: Diagnosis present

## 2023-04-09 DIAGNOSIS — Z6372 Alcoholism and drug addiction in family: Secondary | ICD-10-CM

## 2023-04-09 DIAGNOSIS — N179 Acute kidney failure, unspecified: Principal | ICD-10-CM | POA: Diagnosis present

## 2023-04-09 DIAGNOSIS — E872 Acidosis, unspecified: Secondary | ICD-10-CM | POA: Diagnosis not present

## 2023-04-09 DIAGNOSIS — D649 Anemia, unspecified: Secondary | ICD-10-CM | POA: Diagnosis not present

## 2023-04-09 DIAGNOSIS — N140 Analgesic nephropathy: Secondary | ICD-10-CM | POA: Diagnosis not present

## 2023-04-09 DIAGNOSIS — E785 Hyperlipidemia, unspecified: Secondary | ICD-10-CM | POA: Diagnosis not present

## 2023-04-09 DIAGNOSIS — Z811 Family history of alcohol abuse and dependence: Secondary | ICD-10-CM

## 2023-04-09 DIAGNOSIS — Z813 Family history of other psychoactive substance abuse and dependence: Secondary | ICD-10-CM | POA: Diagnosis not present

## 2023-04-09 DIAGNOSIS — Z841 Family history of disorders of kidney and ureter: Secondary | ICD-10-CM

## 2023-04-09 DIAGNOSIS — Z8349 Family history of other endocrine, nutritional and metabolic diseases: Secondary | ICD-10-CM

## 2023-04-09 DIAGNOSIS — Z79899 Other long term (current) drug therapy: Secondary | ICD-10-CM

## 2023-04-09 LAB — CBC WITH DIFFERENTIAL/PLATELET
Abs Immature Granulocytes: 0.03 10*3/uL (ref 0.00–0.07)
Basophils Absolute: 0 10*3/uL (ref 0.0–0.1)
Basophils Relative: 0 %
Eosinophils Absolute: 0.3 10*3/uL (ref 0.0–0.5)
Eosinophils Relative: 3 %
HCT: 33.4 % — ABNORMAL LOW (ref 36.0–46.0)
Hemoglobin: 10.8 g/dL — ABNORMAL LOW (ref 12.0–15.0)
Immature Granulocytes: 0 %
Lymphocytes Relative: 24 %
Lymphs Abs: 2.2 10*3/uL (ref 0.7–4.0)
MCH: 24.9 pg — ABNORMAL LOW (ref 26.0–34.0)
MCHC: 32.3 g/dL (ref 30.0–36.0)
MCV: 77.1 fL — ABNORMAL LOW (ref 80.0–100.0)
Monocytes Absolute: 0.9 10*3/uL (ref 0.1–1.0)
Monocytes Relative: 9 %
Neutro Abs: 5.8 10*3/uL (ref 1.7–7.7)
Neutrophils Relative %: 64 %
Platelets: 395 10*3/uL (ref 150–400)
RBC: 4.33 MIL/uL (ref 3.87–5.11)
RDW: 15.5 % (ref 11.5–15.5)
WBC: 9.3 10*3/uL (ref 4.0–10.5)
nRBC: 0 % (ref 0.0–0.2)

## 2023-04-09 LAB — CBG MONITORING, ED: Glucose-Capillary: 92 mg/dL (ref 70–99)

## 2023-04-09 MED ORDER — SODIUM CHLORIDE 0.9 % IV BOLUS
1000.0000 mL | Freq: Once | INTRAVENOUS | Status: AC
  Administered 2023-04-09: 1000 mL via INTRAVENOUS

## 2023-04-09 MED ORDER — METOCLOPRAMIDE HCL 5 MG/ML IJ SOLN
10.0000 mg | Freq: Once | INTRAMUSCULAR | Status: AC
  Administered 2023-04-09: 10 mg via INTRAVENOUS
  Filled 2023-04-09: qty 2

## 2023-04-09 NOTE — ED Triage Notes (Signed)
 Pt states she has had N/V since last Tuesday  HA started on Sunday

## 2023-04-09 NOTE — ED Provider Notes (Signed)
 [place] [facility] AT [facility] Provider Note   CSN: [id number] Arrival date & time: 04/09/23  2250     History {Add pertinent medical, surgical, social history, OB history to HPI:1} Chief Complaint  Patient presents with   Headache   Emesis    Cheryl Hart is a 48 y.o. female.  The history is provided by the patient.  Headache Associated symptoms: vomiting   Emesis Associated symptoms: headaches   Cheryl Hart is a 48 y.o. female who presents to the Emergency Department complaining of *** N/v Tuesday last week, generalized weakness. Hasn't eaten since Tuesday. Vomiting every day, any time she eats/drinks.  Ha started Sunday, nagging.  No diarrhea. Mild abdominal discomfort. No dysuria.  Bitemporal headache.  No fever.  Hx/o DM trulicity and metformin. Hasn't checked sugar.     Home Medications Prior to Admission medications   Medication Sig Start Date End Date Taking? Authorizing Provider  fluconazole (DIFLUCAN) 150 MG tablet Take 1 tablet (150 mg total) by mouth daily. May repeat in 3 days if needed. 03/24/23   Donato Schultz, DO  ALPRAZolam (XANAX) 0.25 MG tablet Take 1 tablet (0.25 mg total) by mouth 3 (three) times daily as needed. 03/27/23   Donato Schultz, DO  amoxicillin-clavulanate (AUGMENTIN) 875-125 MG tablet Take 1 tablet by mouth every 12 (twelve) hours. 03/14/23   Renne Crigler, PA-C  Biotin 5000 MCG TABS Take 5,000 mcg by mouth every 14 (fourteen) days.    [provider]  Blood Glucose Monitoring Suppl (BLOOD GLUCOSE MONITOR SYSTEM) w/Device KIT use as drected to check blood sugar 04/30/20   Shamleffer, Konrad Dolores, MD  diphenhydrAMINE (BENADRYL) 25 MG tablet Take 50 mg by mouth at bedtime as needed for sleep.     [provider]  Dulaglutide (TRULICITY) 3 MG/0.5ML SOAJ Inject 3 mg as directed once a week. 01/08/23   Shamleffer, Konrad Dolores, MD  DULoxetine (CYMBALTA) 60 MG capsule Take 1 capsule  (60 mg total) by mouth daily. 03/27/23   Donato Schultz, DO  estradiol (VIVELLE-DOT) 0.075 MG/24HR Place 1 patch onto the skin 2 (two) times a week. 02/09/23   Chrzanowski, Jami B, NP  ezetimibe (ZETIA) 10 MG tablet Take 1 tablet (10 mg total) by mouth daily. 11/25/21   Seabron Spates R, DO  ferrous sulfate 325 (65 FE) MG EC tablet Take 325 mg by mouth daily.    [provider]  glucose blood (FREESTYLE LITE) test strip Use as instructed to check blood sugar 2 times daily 04/30/20   Shamleffer, Konrad Dolores, MD  ibuprofen (ADVIL) 600 MG tablet Take 1 tablet (600 mg total) by mouth every 6 (six) hours as needed. 10/01/18   Donato Schultz, DO  losartan (COZAAR) 25 MG tablet Take 1 tablet (25 mg total) by mouth daily. Pt needs office visit for further refills 03/18/23   Zola Button, Myrene Buddy R, DO  metFORMIN (GLUCOPHAGE) 850 MG tablet Take 1 tablet (850 mg total) by mouth 2 (two) times daily with a meal. 01/08/23   Shamleffer, Konrad Dolores, MD  metoprolol succinate (TOPROL-XL) 50 MG 24 hr tablet Take 1 tablet (50 mg total) by mouth daily. Take with or immediately following a meal. 11/11/21   Zola Button, Grayling Congress, DO  Multiple Vitamin (MULTIVITAMIN WITH MINERALS) TABS tablet Take 1 tablet by mouth daily.    [provider]  nystatin [facility]) powder Apply 1 application topically 3 (three) times daily. 05/19/22   Lowne  Florina Ou R, DO  ondansetron (ZOFRAN-ODT) 4 MG disintegrating tablet Dissolve 1 tablet (4 mg total) in mouth every 8 (eight) hours as needed for nausea or vomiting. 03/27/23   Adonis Huguenin, NP  progesterone (PROMETRIUM) 100 MG capsule Take 1 capsule (100 mg total) by mouth daily. 05/02/22   Chrzanowski, Jami B, NP  rosuvastatin (CRESTOR) 40 MG tablet Take 1 tablet (40 mg total) by mouth daily. 03/27/23   Donato Schultz, DO  Vitamin D, Ergocalciferol, (DRISDOL) 1.25 MG (50000 UNIT) CAPS capsule Take 1 capsule (50,000 Units total) by mouth every 7  (seven) days. 02/23/23   Donato Schultz, DO      Allergies    Sulfa antibiotics and Sulfonamide derivatives    Review of Systems   Review of Systems  Gastrointestinal:  Positive for vomiting.  Neurological:  Positive for headaches.  All other systems reviewed and are negative.   Physical Exam Updated Vital Signs BP (!) 131/95 (BP Location: Left Arm)   Pulse (!) 120   Temp 98.6 F (37 C) (Oral)   Resp 20   Ht 5\' 6"  (1.676 m)   Wt 80.7 kg   SpO2 99%   BMI 28.73 kg/m  Physical Exam Vitals and nursing note reviewed.  Constitutional:      Appearance: She is well-developed.  HENT:     Head: Normocephalic and atraumatic.  Cardiovascular:     Rate and Rhythm: Regular rhythm. Tachycardia present.  Pulmonary:     Effort: Pulmonary effort is normal. No respiratory distress.  Abdominal:     Palpations: Abdomen is soft.     Tenderness: There is no abdominal tenderness. There is no guarding or rebound.  Musculoskeletal:        General: No swelling or tenderness.  Skin:    General: Skin is warm and dry.  Neurological:     Mental Status: She is alert and oriented to person, place, and time.     Comments: No asymmetry of facial, visual fields grossly intact. 5/5 strength in all four extremities.   Psychiatric:        Behavior: Behavior normal.     ED Results / Procedures / Treatments   Labs (all labs ordered are listed, but only abnormal results are displayed) Labs Reviewed - No data to display  EKG None  Radiology No results found.  Procedures Procedures  {Document cardiac monitor, telemetry assessment procedure when appropriate:1}  Medications Ordered in ED Medications - No data to display  ED Course/ Medical Decision Making/ A&P   {   Click here for ABCD2, HEART and other calculatorsREFRESH Note before signing :1}                              Medical Decision Making  ***  {Document critical care time when appropriate:1} {Document review of labs  and clinical decision tools ie heart score, Chads2Vasc2 etc:1}  {Document your independent review of radiology images, and any outside records:1} {Document your discussion with family members, caretakers, and with consultants:1} {Document social determinants of health affecting pt's care:1} {Document your decision making why or why not admission, treatments were needed:1} Final Clinical Impression(s) / ED Diagnoses Final diagnoses:  None    Rx / DC Orders ED Discharge Orders     None

## 2023-04-10 ENCOUNTER — Inpatient Hospital Stay (HOSPITAL_COMMUNITY)

## 2023-04-10 ENCOUNTER — Emergency Department (HOSPITAL_BASED_OUTPATIENT_CLINIC_OR_DEPARTMENT_OTHER)

## 2023-04-10 DIAGNOSIS — M159 Polyosteoarthritis, unspecified: Secondary | ICD-10-CM | POA: Diagnosis present

## 2023-04-10 DIAGNOSIS — Z8349 Family history of other endocrine, nutritional and metabolic diseases: Secondary | ICD-10-CM | POA: Diagnosis not present

## 2023-04-10 DIAGNOSIS — Z8249 Family history of ischemic heart disease and other diseases of the circulatory system: Secondary | ICD-10-CM | POA: Diagnosis not present

## 2023-04-10 DIAGNOSIS — D259 Leiomyoma of uterus, unspecified: Secondary | ICD-10-CM | POA: Diagnosis not present

## 2023-04-10 DIAGNOSIS — Z7984 Long term (current) use of oral hypoglycemic drugs: Secondary | ICD-10-CM | POA: Diagnosis not present

## 2023-04-10 DIAGNOSIS — Z882 Allergy status to sulfonamides status: Secondary | ICD-10-CM | POA: Diagnosis not present

## 2023-04-10 DIAGNOSIS — R112 Nausea with vomiting, unspecified: Secondary | ICD-10-CM | POA: Diagnosis not present

## 2023-04-10 DIAGNOSIS — F32A Depression, unspecified: Secondary | ICD-10-CM | POA: Diagnosis present

## 2023-04-10 DIAGNOSIS — D649 Anemia, unspecified: Secondary | ICD-10-CM | POA: Diagnosis not present

## 2023-04-10 DIAGNOSIS — K219 Gastro-esophageal reflux disease without esophagitis: Secondary | ICD-10-CM | POA: Diagnosis present

## 2023-04-10 DIAGNOSIS — Z818 Family history of other mental and behavioral disorders: Secondary | ICD-10-CM | POA: Diagnosis not present

## 2023-04-10 DIAGNOSIS — Z6372 Alcoholism and drug addiction in family: Secondary | ICD-10-CM | POA: Diagnosis not present

## 2023-04-10 DIAGNOSIS — E860 Dehydration: Secondary | ICD-10-CM | POA: Diagnosis present

## 2023-04-10 DIAGNOSIS — N858 Other specified noninflammatory disorders of uterus: Secondary | ICD-10-CM | POA: Diagnosis not present

## 2023-04-10 DIAGNOSIS — Z7982 Long term (current) use of aspirin: Secondary | ICD-10-CM | POA: Diagnosis not present

## 2023-04-10 DIAGNOSIS — E872 Acidosis, unspecified: Secondary | ICD-10-CM | POA: Diagnosis present

## 2023-04-10 DIAGNOSIS — Z83438 Family history of other disorder of lipoprotein metabolism and other lipidemia: Secondary | ICD-10-CM | POA: Diagnosis not present

## 2023-04-10 DIAGNOSIS — N179 Acute kidney failure, unspecified: Secondary | ICD-10-CM | POA: Diagnosis not present

## 2023-04-10 DIAGNOSIS — N289 Disorder of kidney and ureter, unspecified: Secondary | ICD-10-CM | POA: Diagnosis not present

## 2023-04-10 DIAGNOSIS — R519 Headache, unspecified: Secondary | ICD-10-CM | POA: Diagnosis not present

## 2023-04-10 DIAGNOSIS — Z811 Family history of alcohol abuse and dependence: Secondary | ICD-10-CM | POA: Diagnosis not present

## 2023-04-10 DIAGNOSIS — Z803 Family history of malignant neoplasm of breast: Secondary | ICD-10-CM | POA: Diagnosis not present

## 2023-04-10 DIAGNOSIS — Z813 Family history of other psychoactive substance abuse and dependence: Secondary | ICD-10-CM | POA: Diagnosis not present

## 2023-04-10 DIAGNOSIS — K56609 Unspecified intestinal obstruction, unspecified as to partial versus complete obstruction: Secondary | ICD-10-CM | POA: Diagnosis not present

## 2023-04-10 DIAGNOSIS — I6782 Cerebral ischemia: Secondary | ICD-10-CM | POA: Diagnosis not present

## 2023-04-10 DIAGNOSIS — E119 Type 2 diabetes mellitus without complications: Secondary | ICD-10-CM | POA: Diagnosis present

## 2023-04-10 DIAGNOSIS — I10 Essential (primary) hypertension: Secondary | ICD-10-CM | POA: Diagnosis not present

## 2023-04-10 DIAGNOSIS — Z833 Family history of diabetes mellitus: Secondary | ICD-10-CM | POA: Diagnosis not present

## 2023-04-10 DIAGNOSIS — Z96651 Presence of right artificial knee joint: Secondary | ICD-10-CM | POA: Diagnosis present

## 2023-04-10 DIAGNOSIS — N140 Analgesic nephropathy: Secondary | ICD-10-CM | POA: Diagnosis present

## 2023-04-10 DIAGNOSIS — E785 Hyperlipidemia, unspecified: Secondary | ICD-10-CM | POA: Diagnosis present

## 2023-04-10 LAB — COMPREHENSIVE METABOLIC PANEL WITH GFR
ALT: 15 U/L (ref 0–44)
AST: 21 U/L (ref 15–41)
Albumin: 3.9 g/dL (ref 3.5–5.0)
Alkaline Phosphatase: 100 U/L (ref 38–126)
Anion gap: 15 (ref 5–15)
BUN: 55 mg/dL — ABNORMAL HIGH (ref 6–20)
CO2: 17 mmol/L — ABNORMAL LOW (ref 22–32)
Calcium: 10.3 mg/dL (ref 8.9–10.3)
Chloride: 104 mmol/L (ref 98–111)
Creatinine, Ser: 6.82 mg/dL — ABNORMAL HIGH (ref 0.44–1.00)
GFR, Estimated: 7 mL/min — ABNORMAL LOW (ref >60)
Glucose, Bld: 101 mg/dL — ABNORMAL HIGH (ref 70–99)
Potassium: 4.4 mmol/L (ref 3.5–5.1)
Sodium: 136 mmol/L (ref 135–145)
Total Bilirubin: 0.7 mg/dL (ref 0.0–1.2)
Total Protein: 9.2 g/dL — ABNORMAL HIGH (ref 6.5–8.1)

## 2023-04-10 LAB — URINALYSIS, ROUTINE W REFLEX MICROSCOPIC
Bilirubin Urine: NEGATIVE
Bilirubin Urine: NEGATIVE
Glucose, UA: NEGATIVE mg/dL
Glucose, UA: NEGATIVE mg/dL
Ketones, ur: 15 mg/dL — AB
Ketones, ur: 5 mg/dL — AB
Leukocytes,Ua: NEGATIVE
Leukocytes,Ua: NEGATIVE
Nitrite: NEGATIVE
Nitrite: NEGATIVE
Protein, ur: 100 mg/dL — AB
Protein, ur: 30 mg/dL — AB
Specific Gravity, Urine: 1.025 (ref 1.005–1.030)
Specific Gravity, Urine: 1.013 (ref 1.005–1.030)
pH: 5.5 (ref 5.0–8.0)
pH: 5 (ref 5.0–8.0)

## 2023-04-10 LAB — CBC
HCT: 28.7 % — ABNORMAL LOW (ref 36.0–46.0)
Hemoglobin: 9.4 g/dL — ABNORMAL LOW (ref 12.0–15.0)
MCH: 25.2 pg — ABNORMAL LOW (ref 26.0–34.0)
MCHC: 32.8 g/dL (ref 30.0–36.0)
MCV: 76.9 fL — ABNORMAL LOW (ref 80.0–100.0)
Platelets: 379 10*3/uL (ref 150–400)
RBC: 3.73 MIL/uL — ABNORMAL LOW (ref 3.87–5.11)
RDW: 15.5 % (ref 11.5–15.5)
WBC: 8.4 10*3/uL (ref 4.0–10.5)
nRBC: 0 % (ref 0.0–0.2)

## 2023-04-10 LAB — BASIC METABOLIC PANEL WITH GFR
Anion gap: 11 (ref 5–15)
BUN: 46 mg/dL — ABNORMAL HIGH (ref 6–20)
CO2: 17 mmol/L — ABNORMAL LOW (ref 22–32)
Calcium: 9.4 mg/dL (ref 8.9–10.3)
Chloride: 106 mmol/L (ref 98–111)
Creatinine, Ser: 5.82 mg/dL — ABNORMAL HIGH (ref 0.44–1.00)
GFR, Estimated: 8 mL/min — ABNORMAL LOW (ref >60)
Glucose, Bld: 91 mg/dL (ref 70–99)
Potassium: 4 mmol/L (ref 3.5–5.1)
Sodium: 134 mmol/L — ABNORMAL LOW (ref 135–145)

## 2023-04-10 LAB — HIV ANTIBODY (ROUTINE TESTING W REFLEX): HIV Screen 4th Generation wRfx: NONREACTIVE

## 2023-04-10 LAB — FERRITIN: Ferritin: 73 ng/mL (ref 11–307)

## 2023-04-10 LAB — IRON AND TIBC
Iron: 83 ug/dL (ref 28–170)
Saturation Ratios: 25 % (ref 10.4–31.8)
TIBC: 328 ug/dL (ref 250–450)
UIBC: 245 ug/dL

## 2023-04-10 LAB — GLUCOSE, CAPILLARY
Glucose-Capillary: 94 mg/dL (ref 70–99)
Glucose-Capillary: 83 mg/dL (ref 70–99)
Glucose-Capillary: 174 mg/dL — ABNORMAL HIGH (ref 70–99)
Glucose-Capillary: 121 mg/dL — ABNORMAL HIGH (ref 70–99)

## 2023-04-10 LAB — CK: Total CK: 39 U/L (ref 38–234)

## 2023-04-10 LAB — LACTIC ACID, PLASMA
Lactic Acid, Venous: 0.9 mmol/L (ref 0.5–1.9)
Lactic Acid, Venous: 1 mmol/L (ref 0.5–1.9)

## 2023-04-10 LAB — URINALYSIS, MICROSCOPIC (REFLEX)

## 2023-04-10 LAB — I-STAT VENOUS BLOOD GAS, ED
Acid-base deficit: 11 mmol/L — ABNORMAL HIGH (ref 0.0–2.0)
Bicarbonate: 16.1 mmol/L — ABNORMAL LOW (ref 20.0–28.0)
Calcium, Ion: 1.32 mmol/L (ref 1.15–1.40)
HCT: 34 % — ABNORMAL LOW (ref 36.0–46.0)
Hemoglobin: 11.6 g/dL — ABNORMAL LOW (ref 12.0–15.0)
O2 Saturation: 62 %
Patient temperature: 98.6
Potassium: 4 mmol/L (ref 3.5–5.1)
Sodium: 139 mmol/L (ref 135–145)
TCO2: 17 mmol/L — ABNORMAL LOW (ref 22–32)
pCO2, Ven: 40.6 mmHg — ABNORMAL LOW (ref 44–60)
pH, Ven: 7.207 — ABNORMAL LOW (ref 7.25–7.43)
pO2, Ven: 39 mmHg (ref 32–45)

## 2023-04-10 LAB — SODIUM, URINE, RANDOM: Sodium, Ur: 51 mmol/L

## 2023-04-10 MED ORDER — LORATADINE 10 MG PO TABS: 10.0000 mg | ORAL_TABLET | Freq: Every day | ORAL | Status: DC

## 2023-04-10 MED ORDER — INSULIN ASPART 100 UNIT/ML IJ SOLN: 0.0000 [IU] | Freq: Every day | INTRAMUSCULAR | Status: DC

## 2023-04-10 MED ORDER — ALPRAZOLAM 0.25 MG PO TABS: 0.2500 mg | ORAL_TABLET | Freq: Three times a day (TID) | ORAL | Status: DC | PRN

## 2023-04-10 MED ORDER — ESTRADIOL 0.1 MG/24HR TD PTWK
0.1000 mg | MEDICATED_PATCH | TRANSDERMAL | Status: DC
Start: 2023-04-10 — End: 2023-04-12
  Administered 2023-04-10: 0.1 mg via TRANSDERMAL
  Filled 2023-04-10: qty 1

## 2023-04-10 MED ORDER — LORATADINE 10 MG PO TABS
10.0000 mg | ORAL_TABLET | Freq: Every day | ORAL | Status: DC
  Administered 2023-04-10 – 2023-04-12 (×3): 10 mg via ORAL
  Filled 2023-04-10 (×3): qty 1

## 2023-04-10 MED ORDER — DULOXETINE HCL 60 MG PO CPEP
60.0000 mg | ORAL_CAPSULE | Freq: Every day | ORAL | Status: DC
Start: 2023-04-10 — End: 2023-04-12
  Administered 2023-04-10 – 2023-04-11 (×2): 60 mg via ORAL
  Filled 2023-04-10 (×2): qty 1

## 2023-04-10 MED ORDER — ESTRADIOL 0.075 MG/24HR TD PTTW: 1.0000 | MEDICATED_PATCH | TRANSDERMAL | Status: DC

## 2023-04-10 MED ORDER — ACETAMINOPHEN 325 MG PO TABS
650.0000 mg | ORAL_TABLET | ORAL | Status: DC | PRN
  Administered 2023-04-10: 650 mg via ORAL
  Filled 2023-04-10: qty 2

## 2023-04-10 MED ORDER — INSULIN ASPART 100 UNIT/ML IJ SOLN
0.0000 [IU] | Freq: Three times a day (TID) | INTRAMUSCULAR | Status: DC
Start: 2023-04-10 — End: 2023-04-12
  Administered 2023-04-10: 1 [IU] via SUBCUTANEOUS

## 2023-04-10 MED ORDER — ESTRADIOL 0.075 MG/24HR TD PTWK
0.0750 mg | MEDICATED_PATCH | TRANSDERMAL | Status: DC
  Filled 2023-04-10: qty 1

## 2023-04-10 MED ORDER — LACTATED RINGERS IV SOLN: INTRAVENOUS | Status: AC

## 2023-04-10 MED ORDER — METOPROLOL SUCCINATE ER 50 MG PO TB24
50.0000 mg | ORAL_TABLET | Freq: Every day | ORAL | Status: DC
  Administered 2023-04-10 – 2023-04-12 (×3): 50 mg via ORAL
  Filled 2023-04-10 (×3): qty 1

## 2023-04-10 MED ORDER — METOCLOPRAMIDE HCL 5 MG/ML IJ SOLN
5.0000 mg | Freq: Three times a day (TID) | INTRAMUSCULAR | Status: DC | PRN
  Administered 2023-04-11: 5 mg via INTRAVENOUS
  Filled 2023-04-10: qty 2

## 2023-04-10 MED ORDER — VITAMIN D (ERGOCALCIFEROL) 1.25 MG (50000 UNIT) PO CAPS
50000.0000 [IU] | ORAL_CAPSULE | ORAL | Status: DC
  Administered 2023-04-12: 50000 [IU] via ORAL
  Filled 2023-04-10: qty 1

## 2023-04-10 MED ORDER — ROSUVASTATIN CALCIUM 20 MG PO TABS
40.0000 mg | ORAL_TABLET | Freq: Every day | ORAL | Status: DC
  Administered 2023-04-10 – 2023-04-11 (×2): 40 mg via ORAL
  Filled 2023-04-10 (×2): qty 2

## 2023-04-10 MED ORDER — PROGESTERONE MICRONIZED 100 MG PO CAPS
100.0000 mg | ORAL_CAPSULE | Freq: Every day | ORAL | Status: DC
  Administered 2023-04-10 – 2023-04-11 (×2): 100 mg via ORAL
  Filled 2023-04-10 (×3): qty 1

## 2023-04-10 MED ORDER — ONDANSETRON HCL 4 MG/2ML IJ SOLN
4.0000 mg | Freq: Four times a day (QID) | INTRAMUSCULAR | Status: DC | PRN
  Administered 2023-04-10: 4 mg via INTRAVENOUS
  Filled 2023-04-10: qty 2

## 2023-04-10 MED ORDER — HEPARIN SODIUM (PORCINE) 5000 UNIT/ML IJ SOLN
5000.0000 [IU] | Freq: Two times a day (BID) | INTRAMUSCULAR | Status: DC
  Administered 2023-04-10 – 2023-04-12 (×5): 5000 [IU] via SUBCUTANEOUS
  Filled 2023-04-10 (×5): qty 1

## 2023-04-10 MED ORDER — STERILE WATER FOR INJECTION IV SOLN
INTRAVENOUS | Status: DC
  Filled 2023-04-10: qty 1000
  Filled 2023-04-10 (×2): qty 150

## 2023-04-10 NOTE — TOC CM/SW Note (Signed)
 Transition of Care [facility]) - Inpatient Brief Assessment   Patient Details  Name: Cheryl Hart MRN: [id number] Date of Birth: 25-Nov-1975  Transition of Care [facility]) CM/SW Contact:    Kermit Balo, RN Phone Number: 04/10/2023, 3:09 PM   Clinical Narrative:  Elevated Cr. Currently no TOC needs. Please consult TOC as needed.   Transition of Care Asessment: Insurance and Status: Insurance coverage has been reviewed Patient has primary care physician: Yes Home environment has been reviewed: home with niece   Prior/Current Home Services: No current home services Social Drivers of Health Review: SDOH reviewed no interventions necessary Readmission risk has been reviewed: Yes Transition of care needs: no transition of care needs at this time

## 2023-04-10 NOTE — Plan of Care (Signed)

## 2023-04-10 NOTE — Plan of Care (Signed)

## 2023-04-10 NOTE — H&P (Signed)
 History and Physical  Glennie Bose ZOX:[id number] DOB: 03/17/1975 DOA: 04/09/2023  Referring physician: Dr. Madilyn Hook PCP: Zola Button, Grayling Congress, DO  Outpatient Specialists:   Patient coming from: [facility] ED  Chief Complaint: Nausea and vomiting  HPI:  Patient is a 48 year old female with past medical history significant for diabetes mellitus type 2 since 2010, anemia, hypertension and hyperlipidemia.  Prior to admission, patient was on metformin 850 Mg p.o. twice daily and Trulicity injection 3 Mg every week (Thursday) for diabetes mellitus management, and losartan for hypertension.  Patient has been on NSAIDs (ibuprofen and Aleve) almost daily for the last 2 years.  Patient developed nausea and vomiting about 10 days ago.  Last vomiting episode was last night.  Patient has associated headache for which patient has BC powder intermittently.  On presentation yesterday, patient was found to have BUN of 55, serum creatinine of 6.82, CO2 of 17 and EGFR of 7 mL/min/1.73 m (baseline serum creatinine of 0.64, BUN of 6 and CO2 of 23).  Patient has been admitted for further assessment and management of acute kidney injury.  No prior history of glomerulonephritis, polycystic kidney disease or nephrolithiasis.  Patient reports foamy urine, but nonbloody.  Patient tells me that she continues to make adequate urine.  Input and output documentation may not be accurate.  No skin rashes or joint pains.  Patient denies use of other nephrotoxins.  No other constitutional symptoms endorsed.   ED Course: Patient is currently being volume resuscitated.  Patient is on bicarbonate drip.  CMP done on presentation revealed sodium of 136, potassium of 4.4, chloride of 104, CO2 of 17, creatinine0.82, glucose of greater than 101 AST of 21 and ALT of 15 and estimated GFR of 7 mL/min/1.73 m.     Pertinent labs: See above. EKG: Independently reviewed.  Imaging: independently reviewed.   Review of Systems:   Negative for fever, visual changes, sore throat, rash, new muscle aches, chest pain, SOB, dysuria, or bleeding.  Past Medical History:  Diagnosis Date   Anemia    Anxiety    Arthritis    KNEE RIGHT   Blood transfusion without reported diagnosis    Depression    Diabetes mellitus    History of kidney stones    Hyperlipidemia    Hypertension    PT.DENIES,MEDS FOR KIDNEY AND PALPATIONS   PCOS (polycystic ovarian syndrome)    Uterine fibroid    Vitamin B12 deficiency     Past Surgical History:  Procedure Laterality Date   CESAREAN SECTION     CHOLECYSTECTOMY     g1 p1     LAPAROSCOPIC [place] ASSISTED MYOMECTOMY  02/12/2015   Dr Bonnielee Haff   TOTAL KNEE ARTHROPLASTY Right 03/05/2022   Procedure: RIGHT TOTAL KNEE ARTHROPLASTY;  Surgeon: Nadara Mustard, MD;  Location: [facility] OR;  Service: Orthopedics;  Laterality: Right;     reports that she has never smoked. She has never been exposed to tobacco smoke. She has never used smokeless tobacco. She reports that she does not drink alcohol and does not use drugs.  Allergies  Allergen Reactions   Sulfa Antibiotics Anaphylaxis and Rash   Sulfonamide Derivatives Anaphylaxis, Photosensitivity, Rash and Other (See Comments)    Severe headache    Family History  Problem Relation Age of Onset   Crohn's disease Mother    Diabetes Mother    Hypertension Mother    Hyperlipidemia Mother    Kidney disease Mother    Thyroid disease Mother  Anxiety disorder Mother    Diabetes Father    Hypertension Father    Anxiety disorder Father    Liver disease Father    Alcoholism Father    Drug abuse Father    Depression Father    Breast cancer Maternal Grandmother 23   Diabetes Brother    Migraines Other    Colon cancer Neg Hx    Colon polyps Neg Hx    Esophageal cancer Neg Hx    Rectal cancer Neg Hx    Stomach cancer Neg Hx    Ulcerative colitis Neg Hx      Prior to Admission medications   Medication Sig Start Date End Date Taking?  Authorizing Provider  ALPRAZolam (XANAX) 0.25 MG tablet Take 1 tablet (0.25 mg total) by mouth 3 (three) times daily as needed. 03/27/23  Yes Donato Schultz, DO  Aspirin-Salicylamide-Caffeine (BC HEADACHE POWDER PO) Take 1 packet by mouth daily as needed (headache).   Yes [provider]  diphenhydrAMINE (BENADRYL) 25 MG tablet Take 25 mg by mouth at bedtime as needed for sleep.   Yes [provider]  Dulaglutide (TRULICITY) 3 MG/0.5ML SOAJ Inject 3 mg as directed once a week. Patient taking differently: Inject 3 mg as directed every Thursday. 01/08/23  Yes Shamleffer, Konrad Dolores, MD  DULoxetine (CYMBALTA) 60 MG capsule Take 1 capsule (60 mg total) by mouth daily. Patient taking differently: Take 60 mg by mouth at bedtime. 03/27/23  Yes Seabron Spates R, DO  estradiol (VIVELLE-DOT) 0.075 MG/24HR Place 1 patch onto the skin 2 (two) times a week. Patient taking differently: Place 1 patch onto the skin See admin instructions. Apply 1 patch topically twice a week on Tuesday and Friday. 02/09/23  Yes Chrzanowski, Jami B, NP  losartan (COZAAR) 25 MG tablet Take 1 tablet (25 mg total) by mouth daily. Pt needs office visit for further refills 03/18/23  Yes Lowne Irish Elders, DO  metFORMIN (GLUCOPHAGE) 850 MG tablet Take 1 tablet (850 mg total) by mouth 2 (two) times daily with a meal. 01/08/23  Yes Shamleffer, Konrad Dolores, MD  metoprolol succinate (TOPROL-XL) 50 MG 24 hr tablet Take 1 tablet (50 mg total) by mouth daily. Take with or immediately following a meal. Patient taking differently: Take 50 mg by mouth daily as needed (palpitations). 11/11/21  Yes Donato Schultz, DO  Multiple Vitamins-Minerals (WOMENS MULTIVITAMIN GUMMIES) CHEW Chew 2 each by mouth daily.   Yes [provider]  naproxen sodium (ALEVE) 220 MG tablet Take 440 mg by mouth 2 (two) times daily as needed (knee pain, stiffness).   Yes [provider]  nystatin [facility]) powder  Apply 1 application topically 3 (three) times daily. Patient taking differently: Apply 1 Application topically 3 (three) times daily as needed (skin irritaiton). 05/19/22  Yes Seabron Spates R, DO  ondansetron (ZOFRAN-ODT) 4 MG disintegrating tablet Dissolve 1 tablet (4 mg total) in mouth every 8 (eight) hours as needed for nausea or vomiting. 03/27/23  Yes Adonis Huguenin, NP  progesterone (PROMETRIUM) 100 MG capsule Take 1 capsule (100 mg total) by mouth daily. Patient taking differently: Take 100 mg by mouth at bedtime. 05/02/22  Yes Chrzanowski, Jami B, NP  rosuvastatin (CRESTOR) 40 MG tablet Take 1 tablet (40 mg total) by mouth daily. Patient taking differently: Take 40 mg by mouth at bedtime. 03/27/23  Yes Donato Schultz, DO  Vitamin D, Ergocalciferol, (DRISDOL) 1.25 MG (50000 UNIT) CAPS capsule Take 1 capsule (50,000  Units total) by mouth every 7 (seven) days. Patient taking differently: Take 50,000 Units by mouth every Sunday. 02/23/23  Yes Seabron Spates R, DO  Blood Glucose Monitoring Suppl (BLOOD GLUCOSE MONITOR SYSTEM) w/Device KIT use as drected to check blood sugar 04/30/20   Shamleffer, Konrad Dolores, MD  glucose blood (FREESTYLE LITE) test strip Use as instructed to check blood sugar 2 times daily 04/30/20   Shamleffer, Konrad Dolores, MD    Physical Exam: Vitals:   04/10/23 0234 04/10/23 0319 04/10/23 0338 04/10/23 0818  BP: (!) 122/97 (!) 138/96 (!) 130/92 117/77  Pulse:  (!) 120 (!) 104 95  Resp: 19 20 18 16   Temp:  97.8 F (36.6 C) 97.8 F (36.6 C) 97.8 F (36.6 C)  TempSrc:  Oral Oral Oral  SpO2:  100% 100% 98%  Weight:      Height:         Constitutional:  Appears calm and comfortable Eyes:  Patient is pale.  No jaundice.    ENMT:  external ears, nose appear normal Neck:  Neck is supple. No JVD Respiratory:  CTA bilaterally, no w/r/r.  Respiratory effort normal. No retractions or accessory muscle use Cardiovascular:  S1S2 No LE extremity  edema   Abdomen:  Abdomen is soft and non tender. Organs are difficult to assess. Neurologic:  Awake and alert. Moves all limbs.  Wt Readings from Last 3 Encounters:  04/09/23 80.7 kg  03/14/23 77.6 kg  01/08/23 81.5 kg    I have personally reviewed following labs and imaging studies  Labs on Admission:  CBC: Recent Labs  Lab 04/09/23 2340 04/10/23 0118  WBC 9.3  --   NEUTROABS 5.8  --   HGB 10.8* 11.6*  HCT 33.4* 34.0*  MCV 77.1*  --   PLT 395  --    Basic Metabolic Panel: Recent Labs  Lab 04/09/23 2340 04/10/23 0118  NA 136 139  K 4.4 4.0  CL 104  --   CO2 17*  --   GLUCOSE 101*  --   BUN 55*  --   CREATININE 6.82*  --   CALCIUM 10.3  --    Liver Function Tests: Recent Labs  Lab 04/09/23 2340  AST 21  ALT 15  ALKPHOS 100  BILITOT 0.7  PROT 9.2*  ALBUMIN 3.9   No results for input(s): "LIPASE", "AMYLASE" in the last 168 hours. No results for input(s): "AMMONIA" in the last 168 hours. Coagulation Profile: No results for input(s): "INR", "PROTIME" in the last 168 hours. Cardiac Enzymes: No results for input(s): "CKTOTAL", "CKMB", "CKMBINDEX", "TROPONINI" in the last 168 hours. BNP (last 3 results) No results for input(s): "PROBNP" in the last 8760 hours. HbA1C: No results for input(s): "HGBA1C" in the last 72 hours. CBG: Recent Labs  Lab 04/09/23 2344 04/10/23 0617  GLUCAP 92 94   Lipid Profile: No results for input(s): "CHOL", "HDL", "LDLCALC", "TRIG", "CHOLHDL", "LDLDIRECT" in the last 72 hours. Thyroid Function Tests: No results for input(s): "TSH", "T4TOTAL", "FREET4", "T3FREE", "THYROIDAB" in the last 72 hours. Anemia Panel: No results for input(s): "VITAMINB12", "FOLATE", "FERRITIN", "TIBC", "IRON", "RETICCTPCT" in the last 72 hours. Urine analysis:    Component Value Date/Time   COLORURINE YELLOW 04/10/2023 0100   APPEARANCEUR CLEAR 04/10/2023 0100   LABSPEC 1.025 04/10/2023 0100   PHURINE 5.5 04/10/2023 0100   GLUCOSEU NEGATIVE  04/10/2023 0100   HGBUR TRACE (A) 04/10/2023 0100   HGBUR negative 02/14/2010 1032   BILIRUBINUR NEGATIVE 04/10/2023 0100  BILIRUBINUR neg 02/09/2017 1516   KETONESUR 15 (A) 04/10/2023 0100   PROTEINUR 100 (A) 04/10/2023 0100   UROBILINOGEN 0.2 02/09/2017 1516   UROBILINOGEN 1.0 07/05/2012 2349   NITRITE NEGATIVE 04/10/2023 0100   LEUKOCYTESUR NEGATIVE 04/10/2023 0100   Sepsis Labs: @LABRCNTIP (procalcitonin:4,lacticidven:4) )No results found for this or any previous visit (from the past 240 hours).    Radiological Exams on Admission: CT ABDOMEN PELVIS WO CONTRAST Result Date: 04/10/2023 CLINICAL DATA:  Acute nonlocalized abdominal pain. Bowel obstruction is suspected. Nausea and vomiting for 10 days. EXAM: CT ABDOMEN AND PELVIS WITHOUT CONTRAST TECHNIQUE: Multidetector CT imaging of the abdomen and pelvis was performed following the standard protocol without IV contrast. RADIATION DOSE REDUCTION: This exam was performed according to the departmental dose-optimization program which includes automated exposure control, adjustment of the mA and/or kV according to patient size and/or use of iterative reconstruction technique. COMPARISON:  07/10/2017 FINDINGS: Lower chest: Lung bases are clear. Hepatobiliary: No focal liver abnormality is seen. Status post cholecystectomy. No biliary dilatation. Pancreas: Unremarkable. No pancreatic ductal dilatation or surrounding inflammatory changes. Spleen: Normal in size without focal abnormality. Adrenals/Urinary Tract: Adrenal glands are unremarkable. Kidneys are normal, without renal calculi, focal lesion, or hydronephrosis. Bladder is unremarkable. Stomach/Bowel: Stomach, small bowel, and colon are not abnormally distended. No wall thickening or inflammatory changes. Stool throughout the colon. Increased density material in the stomach, some small bowel, and colon likely representing opaque medication. Appendix is normal. Vascular/Lymphatic: No significant  vascular findings are present. No enlarged abdominal or pelvic lymph nodes. Reproductive: Nodular appearance of the uterus with uterine calcification consistent with calcified fibroids. No change since prior study. No abnormal adnexal masses. Other: No abdominal wall hernia or abnormality. No abdominopelvic ascites. Musculoskeletal: Mild degenerative changes in the spine. No acute bony abnormalities. IMPRESSION: 1. No acute process demonstrated in the abdomen or pelvis. No evidence of bowel obstruction or inflammation. 2. Uterine fibroids without significant change. Electronically Signed   By: Burman Nieves M.D.   On: 04/10/2023 01:08   CT Head Wo Contrast Result Date: 04/10/2023 CLINICAL DATA:  Headache with increasing frequency or severity. Headache for 5 days. History of diabetes and hypertension. EXAM: CT HEAD WITHOUT CONTRAST TECHNIQUE: Contiguous axial images were obtained from the base of the skull through the vertex without intravenous contrast. RADIATION DOSE REDUCTION: This exam was performed according to the departmental dose-optimization program which includes automated exposure control, adjustment of the mA and/or kV according to patient size and/or use of iterative reconstruction technique. COMPARISON:  None Available. FINDINGS: Brain: Mild diffuse cerebral atrophy. No ventricular dilatation. Mild low-attenuation change in the periventricular white matter likely representing small vessel ischemia but other demyelinating processes could also have this appearance. Gray-white matter junctions are distinct. Basal cisterns are not effaced. No mass-effect or midline shift. No abnormal extra-axial fluid collections. No acute intracranial hemorrhage. Vascular: No hyperdense vessel or unexpected calcification. Skull: Normal. Negative for fracture or focal lesion. Sinuses/Orbits: No acute finding. Other: None. IMPRESSION: No acute intracranial abnormalities. Mild chronic atrophy and small vessel ischemic  changes. Electronically Signed   By: Burman Nieves M.D.   On: 04/10/2023 01:05    EKG: Independently reviewed.   Principal Problem:   AKI (acute kidney injury) (HCC)   Assessment/Plan Acute kidney injury: -Etiology is likely multifactorial. -Patient has had nausea vomiting for about 10 days. -Patient has been on NSAIDs for about 2 years.  Patient increase NSAIDs use recently due to headache. -Prior to presentation, patient was on metformin and losartan. -  Last nausea and vomiting was last night. -Continue IV fluids. -Workup for acute kidney injury (urinalysis, urine sodium, CK, renal ultrasound, ANA, C3, C4). -Repeat BMP today. -Avoid nephrotoxins. -Keep MAP greater than 65 mmHg. -Dose all medications, assuming EGFR of less than 5 mL/min per 1.73). -Strict I's and O's. -Low threshold to consult the nephrology team.  Nausea and vomiting: -Patient has had nausea vomiting for about 10 days. -Patient is a diabetic. -Cannot rule out possible gastroparesis. -According to the patient, last nausea vomiting occurred last night. -Will use IV Reglan as needed. -If nausea and vomiting recur, will have low threshold to consider gastric emptying studies. -Cannot rule out viral etiology.  Volume depletion: -Continue IV fluids.  Type 2 diabetes mellitus: -Hold metformin. -Hold Trulicity. -Sliding scale insulin coverage. -Last A1c was 5.5%.   Hypertension: -Blood pressure is controlled.  Hyperlipidemia: -Continue Crestor  Acidosis: -Likely metabolic acidosis, possibly secondary to acute kidney injury.  Versus lactic acidosis (patient was on metformin). -Check lactic acid level. -Will have low threshold to discontinue bicarb drip.Marland Kitchen  DVT prophylaxis: Subcutaneous heparin. Code Status: Full code. Family Communication:  Disposition Plan: Likely discharge back home eventually. Consults called: None. Admission status: Inpatient.  Time spent: 85 minutes.   Berton Mount,  MD  [facility] Pager #: [phone] 7PM-7AM contact night coverage as above  04/10/2023, 11:15 AM      Nephrotoxins.

## 2023-04-10 NOTE — Progress Notes (Signed)
   04/10/23 1755  Spiritual Encounters  Type of Visit Initial  Care provided to: Patient  Referral source Other (comment) (Spiritual Consult)  Reason for visit Advance directives  OnCall Visit No   Chaplain responded to a spiritual consult for advanced directive education  The patient, Cheryl Hart, welcomed me into her space and we talked about the purpose of the advanced directive as well as who needed to be present for signing. I assured Cheryl Hart that she is the only one that was needed to be present. She plans to work on the packet and I advised her to let her nurse know when it is complete and we will proceed from there.   Cheryl Hart [facility]  [phone]

## 2023-04-10 NOTE — Care Plan (Addendum)
 Plan of Care Note for accepted transfer  Patient: Cheryl Hart              ZOX:[id number]  DOA: 04/09/2023     Facility requesting transfer: [facility] Requesting Provider: Dr. Madilyn Hook  Reason for transfer: Acute kidney injury in the context of intractable nausea and vomiting and metabolic acidosis due to persistent vomiting.   ED triage note:   Pt states she has had N/V since last Tuesday  HA started on Sunday        Facility course: 48 year old female non-insulin-dependent DM type II present emergency department complaining of vomiting and headache. She presents to the emergency department for evaluation of symptoms that started a week ago Tuesday with nausea, vomiting and generalized weakness. She has not eaten since then and has been vomiting anytime she tries to eat or drink. On Sunday she developed a mild nagging bitemporal headache that has worsened over the last 24 hours. She has mild abdominal discomfort. No dysuria. No diarrhea. No fever. Last BM was yesterday.   Patient reported she has been taking Trulicity and metformin even though patient having persistent nausea and vomiting.  Patient has history of endometrial ablation and denies any chance of pregnancy.  At presentation to ED patient is hemodynamically stable. VBG showing low pH 7.2, low pCO2 40, pO2 39, low bicarb 19. CMP showing low bicarb 17, blood glucose 101, elevated BUN 55, elevated creatinine 6.82, low GFR 7. CBC showing stable H&H 10.8 and 33. Pending UA.  CT head no acute intracranial abnormality.  CT abdomen pelvis no acute intra-abdominal pelvic finding.  No evidence for small bowel obstruction or inflammation.  Uterine fibroid without significant change.  In the ED patient has been given 1 L of NS bolus, currently on LR 125 cc/h.  And received Reglan 10 mg.   Dr. Madilyn Hook has been consulted to speak with nephrology Dr. Melanee Spry recommended admit either [facility] or [facility] hospital and will  see patient.  No specific recommendation for IV fluid at this time.  Patient is currently onLR and transitioning to bicarb drip.  Once bicarb drip will be available to infusion need to discontinue the LR drip.    Plan of care: The patient is accepted for admission for inpatient status to Telemetry unit, at [facility].  [facility] will assume care on arrival to accepting facility. Until arrival, care as per EDP. However, [facility] available 24/7 for questions and assistance.   Check [email or web address] for on-call coverage.   Nursing staff, please call [facility] Admits & Consults System-Wide number under Amion on patient's arrival so appropriate admitting provider can evaluate the pt.    Author: Tereasa Coop, MD  04/10/2023  [facility]

## 2023-04-10 NOTE — Progress Notes (Signed)
 The [facility] Admits & Consults System-wide was paged at [phone] to notify them of pt's arrival to unit. They called back at 320 to confirm that pt was assigned to an admitting MD

## 2023-04-10 NOTE — ED Notes (Signed)
 Unable to start Sodium Bicarbonate infusion at this time - Medication (Sterile water 1L) is not available. Able to substitute with Dextrose per pharmacy however pt is DM. EDP advised no IVP of Sodium bicardonate. Will Cont IV infusion of LR per order- Via MAR as written.

## 2023-04-10 NOTE — ED Notes (Signed)
 ED TO INPATIENT HANDOFF REPORT  ED Nurse Name and Phone #: Hayden Pedro RN [phone]   S Name/Age/Gender Cheryl Hart 48 y.o. female Room/Bed: MH02/MH02  Code Status   Code Status: Prior  Home/SNF/Other Home Patient oriented to: self, place, time, and situation Is this baseline? Yes   Triage Complete: Triage complete  Chief Complaint AKI (acute kidney injury) (HCC) [N17.9]  Triage Note Pt states she has had N/V since last Tuesday  HA started on Sunday    Allergies Allergies  Allergen Reactions   Sulfa Antibiotics Other (See Comments) and Rash   Sulfonamide Derivatives Photosensitivity, Rash and Other (See Comments)    Severe headache    Level of Care/Admitting Diagnosis ED Disposition     ED Disposition  Admit   Condition  --   Comment  Hospital Area: [facility] [[id number]]  Level of Care: Telemetry Medical [[id number]]  May admit patient to [facility] or [facility] if equivalent level of care is available:: Yes  Interfacility transfer: Yes  Covid Evaluation: Asymptomatic - no recent exposure (last 10 days) testing not required  Diagnosis: AKI (acute kidney injury) [facility]) [[id number]]  Admitting Physician: Tereasa Coop [[id number]]  Attending Physician: Tereasa Coop [[id number]]  Certification:: I certify this patient will need inpatient services for at least 2 midnights  Expected Medical Readiness: 04/14/2023          B Medical/Surgery History Past Medical History:  Diagnosis Date   Anemia    Anxiety    Arthritis    KNEE RIGHT   Blood transfusion without reported diagnosis    Depression    Diabetes mellitus    History of kidney stones    Hyperlipidemia    Hypertension    PT.DENIES,MEDS FOR KIDNEY AND PALPATIONS   PCOS (polycystic ovarian syndrome)    Uterine fibroid    Vitamin B12 deficiency    Past Surgical History:  Procedure Laterality Date   CESAREAN SECTION     CHOLECYSTECTOMY     g1 p1     LAPAROSCOPIC GELPORT  ASSISTED MYOMECTOMY  02/12/2015   Dr Bonnielee Haff   TOTAL KNEE ARTHROPLASTY Right 03/05/2022   Procedure: RIGHT TOTAL KNEE ARTHROPLASTY;  Surgeon: Nadara Mustard, MD;  Location: [facility] OR;  Service: Orthopedics;  Laterality: Right;     A IV Location/Drains/Wounds Patient Lines/Drains/Airways Status     Active Line/Drains/Airways     Name Placement date Placement time Site Days   Peripheral IV 04/09/23 20 G 1" Left Antecubital 04/09/23  2351  Antecubital  1            Intake/Output Last 24 hours  Intake/Output Summary (Last 24 hours) at 04/10/2023 0205 Last data filed at 04/10/2023 0134 Gross per 24 hour  Intake 1000.47 ml  Output --  Net 1000.47 ml    Labs/Imaging Results for orders placed or performed during the hospital encounter of 04/09/23 (from the past 48 hours)  Comprehensive metabolic panel     Status: Abnormal   Collection Time: 04/09/23 11:40 PM  Result Value Ref Range   Sodium 136 135 - 145 mmol/L   Potassium 4.4 3.5 - 5.1 mmol/L   Chloride 104 98 - 111 mmol/L   CO2 17 (L) 22 - 32 mmol/L   Glucose, Bld 101 (H) 70 - 99 mg/dL    Comment: Glucose reference range applies only to samples taken after fasting for at least 8 hours.   BUN 55 (H) 6 - 20 mg/dL   Creatinine, Ser 6.38 (H) 0.44 -  1.00 mg/dL   Calcium 60.4 8.9 - 54.0 mg/dL   Total Protein 9.2 (H) 6.5 - 8.1 g/dL   Albumin 3.9 3.5 - 5.0 g/dL   AST 21 15 - 41 U/L   ALT 15 0 - 44 U/L   Alkaline Phosphatase 100 38 - 126 U/L   Total Bilirubin 0.7 0.0 - 1.2 mg/dL   GFR, Estimated 7 (L) >60 mL/min    Comment: (NOTE) Calculated using the CKD-EPI Creatinine Equation (2021)    Anion gap 15 5 - 15    Comment: Performed at [facility], [address] [place], [place] [address]  CBC with Differential     Status: Abnormal   Collection Time: 04/09/23 11:40 PM  Result Value Ref Range   WBC 9.3 4.0 - 10.5 K/uL   RBC 4.33 3.87 - 5.11 MIL/uL   Hemoglobin 10.8 (L) 12.0 - 15.0 g/dL   HCT 14.7 (L) 82.9 - 56.2 %    MCV 77.1 (L) 80.0 - 100.0 fL   MCH 24.9 (L) 26.0 - 34.0 pg   MCHC 32.3 30.0 - 36.0 g/dL   RDW 13.0 86.5 - 78.4 %   Platelets 395 150 - 400 K/uL   nRBC 0.0 0.0 - 0.2 %   Neutrophils Relative % 64 %   Neutro Abs 5.8 1.7 - 7.7 K/uL   Lymphocytes Relative 24 %   Lymphs Abs 2.2 0.7 - 4.0 K/uL   Monocytes Relative 9 %   Monocytes Absolute 0.9 0.1 - 1.0 K/uL   Eosinophils Relative 3 %   Eosinophils Absolute 0.3 0.0 - 0.5 K/uL   Basophils Relative 0 %   Basophils Absolute 0.0 0.0 - 0.1 K/uL   Immature Granulocytes 0 %   Abs Immature Granulocytes 0.03 0.00 - 0.07 K/uL    Comment: Performed at [facility], [address] [place], [place] [address]  CBG monitoring, ED     Status: None   Collection Time: 04/09/23 11:44 PM  Result Value Ref Range   Glucose-Capillary 92 70 - 99 mg/dL    Comment: Glucose reference range applies only to samples taken after fasting for at least 8 hours.  Urinalysis, Routine w reflex microscopic -Urine, Clean Catch     Status: Abnormal   Collection Time: 04/10/23  1:00 AM  Result Value Ref Range   Color, Urine YELLOW YELLOW   APPearance CLEAR CLEAR   Specific Gravity, Urine 1.025 1.005 - 1.030   pH 5.5 5.0 - 8.0   Glucose, UA NEGATIVE NEGATIVE mg/dL   Hgb urine dipstick TRACE (A) NEGATIVE   Bilirubin Urine NEGATIVE NEGATIVE   Ketones, ur 15 (A) NEGATIVE mg/dL   Protein, ur 528 (A) NEGATIVE mg/dL   Nitrite NEGATIVE NEGATIVE   Leukocytes,Ua NEGATIVE NEGATIVE    Comment: Performed at [facility], [address] [place], [place] [address]  Urinalysis, Microscopic (reflex)     Status: Abnormal   Collection Time: 04/10/23  1:00 AM  Result Value Ref Range   RBC / HPF 0-5 0 - 5 RBC/hpf   WBC, UA 6-10 0 - 5 WBC/hpf   Bacteria, UA MANY (A) NONE SEEN   Squamous Epithelial / HPF 6-10 0 - 5 /HPF    Comment: Performed at [facility], [address] [place], [place] [address]  I-Stat venous blood gas, ED     Status: Abnormal    Collection Time: 04/10/23  1:18 AM  Result Value Ref Range  pH, Ven 7.207 (L) 7.25 - 7.43   pCO2, Ven 40.6 (L) 44 - 60 mmHg   pO2, Ven 39 32 - 45 mmHg   Bicarbonate 16.1 (L) 20.0 - 28.0 mmol/L   TCO2 17 (L) 22 - 32 mmol/L   O2 Saturation 62 %   Acid-base deficit 11.0 (H) 0.0 - 2.0 mmol/L   Sodium 139 135 - 145 mmol/L   Potassium 4.0 3.5 - 5.1 mmol/L   Calcium, Ion 1.32 1.15 - 1.40 mmol/L   HCT 34.0 (L) 36.0 - 46.0 %   Hemoglobin 11.6 (L) 12.0 - 15.0 g/dL   Patient temperature 16.1 F    Collection site IV start    Drawn by Nurse    Sample type VENOUS    Comment NOTIFIED PHYSICIAN    CT ABDOMEN PELVIS WO CONTRAST Result Date: 04/10/2023 CLINICAL DATA:  Acute nonlocalized abdominal pain. Bowel obstruction is suspected. Nausea and vomiting for 10 days. EXAM: CT ABDOMEN AND PELVIS WITHOUT CONTRAST TECHNIQUE: Multidetector CT imaging of the abdomen and pelvis was performed following the standard protocol without IV contrast. RADIATION DOSE REDUCTION: This exam was performed according to the departmental dose-optimization program which includes automated exposure control, adjustment of the mA and/or kV according to patient size and/or use of iterative reconstruction technique. COMPARISON:  07/10/2017 FINDINGS: Lower chest: Lung bases are clear. Hepatobiliary: No focal liver abnormality is seen. Status post cholecystectomy. No biliary dilatation. Pancreas: Unremarkable. No pancreatic ductal dilatation or surrounding inflammatory changes. Spleen: Normal in size without focal abnormality. Adrenals/Urinary Tract: Adrenal glands are unremarkable. Kidneys are normal, without renal calculi, focal lesion, or hydronephrosis. Bladder is unremarkable. Stomach/Bowel: Stomach, small bowel, and colon are not abnormally distended. No wall thickening or inflammatory changes. Stool throughout the colon. Increased density material in the stomach, some small bowel, and colon likely representing opaque medication.  Appendix is normal. Vascular/Lymphatic: No significant vascular findings are present. No enlarged abdominal or pelvic lymph nodes. Reproductive: Nodular appearance of the uterus with uterine calcification consistent with calcified fibroids. No change since prior study. No abnormal adnexal masses. Other: No abdominal wall hernia or abnormality. No abdominopelvic ascites. Musculoskeletal: Mild degenerative changes in the spine. No acute bony abnormalities. IMPRESSION: 1. No acute process demonstrated in the abdomen or pelvis. No evidence of bowel obstruction or inflammation. 2. Uterine fibroids without significant change. Electronically Signed   By: Burman Nieves M.D.   On: 04/10/2023 01:08   CT Head Wo Contrast Result Date: 04/10/2023 CLINICAL DATA:  Headache with increasing frequency or severity. Headache for 5 days. History of diabetes and hypertension. EXAM: CT HEAD WITHOUT CONTRAST TECHNIQUE: Contiguous axial images were obtained from the base of the skull through the vertex without intravenous contrast. RADIATION DOSE REDUCTION: This exam was performed according to the departmental dose-optimization program which includes automated exposure control, adjustment of the mA and/or kV according to patient size and/or use of iterative reconstruction technique. COMPARISON:  None Available. FINDINGS: Brain: Mild diffuse cerebral atrophy. No ventricular dilatation. Mild low-attenuation change in the periventricular white matter likely representing small vessel ischemia but other demyelinating processes could also have this appearance. Gray-white matter junctions are distinct. Basal cisterns are not effaced. No mass-effect or midline shift. No abnormal extra-axial fluid collections. No acute intracranial hemorrhage. Vascular: No hyperdense vessel or unexpected calcification. Skull: Normal. Negative for fracture or focal lesion. Sinuses/Orbits: No acute finding. Other: None. IMPRESSION: No acute intracranial  abnormalities. Mild chronic atrophy and small vessel ischemic changes. Electronically Signed   By: Burman Nieves  M.D.   On: 04/10/2023 01:05    Pending Labs Unresulted Labs (From admission, onward)     Start     Ordered   04/10/23 0154  Urine Culture  Once,   URGENT       Question:  Indication  Answer:  Suprapubic pain   04/10/23 0153            Vitals/Pain Today's Vitals   04/09/23 2259 04/09/23 2300 04/09/23 2309 04/09/23 2309  BP: (!) 131/95     Pulse: (!) 120     Resp: 20     Temp: 98.6 F (37 C)     TempSrc: Oral     SpO2: 100%  99%   Weight:  80.7 kg    Height:  5\' 6"  (1.676 m)    PainSc:  6   6     Isolation Precautions No active isolations  Medications Medications  lactated ringers infusion ( Intravenous New Bag/Given 04/10/23 0133)  sodium bicarbonate 150 mEq in sterile water 1,150 mL infusion (has no administration in time range)  ondansetron (ZOFRAN) injection 4 mg (has no administration in time range)  sodium chloride 0.9 % bolus 1,000 mL (0 mLs Intravenous Stopped 04/10/23 0134)  metoCLOPramide (REGLAN) injection 10 mg (10 mg Intravenous Given 04/09/23 2355)    Mobility walks     Focused Assessments Pt presents with headache... with Nausea and vomiting. Last episode of emesis 10 pm.    R Recommendations: See Admitting Provider Note  Report given to:   Additional Notes:

## 2023-04-11 DIAGNOSIS — N179 Acute kidney failure, unspecified: Secondary | ICD-10-CM | POA: Diagnosis not present

## 2023-04-11 LAB — HEPATIC FUNCTION PANEL
ALT: 11 U/L (ref 0–44)
AST: 20 U/L (ref 15–41)
Albumin: 3.1 g/dL — ABNORMAL LOW (ref 3.5–5.0)
Alkaline Phosphatase: 68 U/L (ref 38–126)
Bilirubin, Direct: <0.1 mg/dL (ref 0.0–0.2)
Total Bilirubin: 0.7 mg/dL (ref 0.0–1.2)
Total Protein: 6.7 g/dL (ref 6.5–8.1)

## 2023-04-11 LAB — CBC
HCT: 25.3 % — ABNORMAL LOW (ref 36.0–46.0)
Hemoglobin: 8.3 g/dL — ABNORMAL LOW (ref 12.0–15.0)
MCH: 24.9 pg — ABNORMAL LOW (ref 26.0–34.0)
MCHC: 32.8 g/dL (ref 30.0–36.0)
MCV: 76 fL — ABNORMAL LOW (ref 80.0–100.0)
Platelets: 327 10*3/uL (ref 150–400)
RBC: 3.33 MIL/uL — ABNORMAL LOW (ref 3.87–5.11)
RDW: 15 % (ref 11.5–15.5)
WBC: 6.2 10*3/uL (ref 4.0–10.5)
nRBC: 0 % (ref 0.0–0.2)

## 2023-04-11 LAB — BASIC METABOLIC PANEL WITH GFR
Anion gap: 13 (ref 5–15)
BUN: 37 mg/dL — ABNORMAL HIGH (ref 6–20)
CO2: 21 mmol/L — ABNORMAL LOW (ref 22–32)
Calcium: 9.8 mg/dL (ref 8.9–10.3)
Chloride: 104 mmol/L (ref 98–111)
Creatinine, Ser: 5.23 mg/dL — ABNORMAL HIGH (ref 0.44–1.00)
GFR, Estimated: 9 mL/min — ABNORMAL LOW (ref >60)
Glucose, Bld: 108 mg/dL — ABNORMAL HIGH (ref 70–99)
Potassium: 3.8 mmol/L (ref 3.5–5.1)
Sodium: 138 mmol/L (ref 135–145)

## 2023-04-11 LAB — PROTEIN / CREATININE RATIO, URINE
Creatinine, Urine: 77 mg/dL
Protein Creatinine Ratio: 0.71 mg/mg{creat} — ABNORMAL HIGH (ref 0.00–0.15)
Total Protein, Urine: 55 mg/dL

## 2023-04-11 LAB — GLUCOSE, CAPILLARY
Glucose-Capillary: 109 mg/dL — ABNORMAL HIGH (ref 70–99)
Glucose-Capillary: 102 mg/dL — ABNORMAL HIGH (ref 70–99)
Glucose-Capillary: 105 mg/dL — ABNORMAL HIGH (ref 70–99)
Glucose-Capillary: 103 mg/dL — ABNORMAL HIGH (ref 70–99)

## 2023-04-11 LAB — ANA W/REFLEX IF POSITIVE: Anti Nuclear Antibody (ANA): NEGATIVE

## 2023-04-11 MED ORDER — DARBEPOETIN ALFA 100 MCG/0.5ML IJ SOSY: 100.0000 ug | PREFILLED_SYRINGE | Freq: Once | INTRAMUSCULAR | Status: DC

## 2023-04-11 MED ORDER — FERROUS SULFATE 325 (65 FE) MG PO TABS
325.0000 mg | ORAL_TABLET | Freq: Two times a day (BID) | ORAL | Status: DC
Start: 2023-04-11 — End: 2023-04-12
  Administered 2023-04-11 – 2023-04-12 (×2): 325 mg via ORAL
  Filled 2023-04-11 (×2): qty 1

## 2023-04-11 MED ORDER — SODIUM CHLORIDE 0.9 % IV SOLN: INTRAVENOUS | Status: AC

## 2023-04-11 NOTE — Progress Notes (Signed)
 PROGRESS NOTE    Cheryl Descoteaux  ZOX:[id number] DOB: 1975-09-27 DOA: 04/09/2023 PCP: Donato Schultz, DO  Outpatient Specialists:     Brief Narrative:  Patient is a 48 year old female past medical history significant for type 2 diabetes mellitus, anemia, hypertension and hyperlipidemia.  Patient was admitted with acute kidney injury versus acute kidney injury on chronic kidney disease.  Acute kidney injury slowly improving.  Nephrology input is appreciated.  Total protein improved from 9.2 to 6.7.  Suspect previously elevated total protein of 9.2 was due to volume depletion.  Nephrology is directing care.  BUN of 37 and serum creatinine of 5.23 noted today.  No nausea or vomiting.BUN of 31 and serum creatinine of 5.23 noted today.  No nausea or vomiting.    Assessment & Plan:   Principal Problem:   AKI (acute kidney injury) (HCC)  Acute kidney injury versus acute kidney injury on chronic kidney disease: -Etiology is likely multifactorial. -Patient has had nausea vomiting for about 10 days. -Patient has been on NSAIDs for about 2 years.  Patient increase NSAIDs use recently due to headache. -Prior to presentation, patient was on metformin and losartan. -Nausea and vomiting have resolved. -Continue IV fluids. -Renal ultrasound revealed evidence of medical renal disease.   -CK of 39.   -Urine sodium of 51. -Urinalysis revealed ketones of 5, specific gravity of 1.013 and 30 of protein. -Check urine protein creatinine ratio. -C3, C4, ANA not visualized.  -Protein of 9.2 is likely secondary to volume depletion.  Repeat protein level is 6.7. -Avoid nephrotoxins. -Keep MAP greater than 65 mmHg. -Dose all medications, assuming EGFR of less than 5 mL/min per 1.73). -Strict I's and O's. -Serum creatinine of 5.23 today and BUN of 37 (AKI is improving). -Continue IV fluid. -Nephrology input is highly appreciated.   Nausea and vomiting: -Patient had nausea vomiting for about 10  days. -Nausea and vomiting have resolved.   Volume depletion: -Continue IV fluids.   Type 2 diabetes mellitus: -Hold metformin. -Hold Trulicity. -Sliding scale insulin coverage. -Last A1c was 5.5%.     Hypertension: -Blood pressure is controlled.   Hyperlipidemia: -Continue Crestor   Acidosis: -Likely secondary to acute kidney injury. -CO2 is 21 today.    DVT prophylaxis: Subcutaneous heparin Code Status: Full code. Family Communication:  Disposition Plan: Likely discharge back home in the next 24 to 48 hours.   Consultants:  Nephrology.  Procedures:  None.  Antimicrobials:  None.   Subjective: Vomiting  Objective: Vitals:   04/10/23 2106 04/11/23 0400 04/11/23 0805 04/11/23 1528  BP: 121/82 119/78 134/71 127/89  Pulse: 86 89 89 84  Resp: 18 18 14 16   Temp: 98.6 F (37 C)  99 F (37.2 C) 98.6 F (37 C)  TempSrc: Oral  Oral Oral  SpO2: 99% 99% 100% 100%  Weight:      Height:        Intake/Output Summary (Last 24 hours) at 04/11/2023 1852 Last data filed at 04/11/2023 1800 Gross per 24 hour  Intake 1941.21 ml  Output 1950 ml  Net -8.79 ml   Filed Weights   04/09/23 2300  Weight: 80.7 kg    Examination:  General exam: Appears calm and comfortable  Respiratory system: Clear to auscultation. Respiratory effort normal. Cardiovascular system: S1 & S2 heard, RRR. No JVD, murmurs, rubs, gallops or clicks. No pedal edema. Gastrointestinal system: Abdomen is nondistended, soft and nontender. No organomegaly or masses felt. Normal bowel sounds heard. Central nervous system: Alert and oriented. No  focal neurological deficits. Extremities: Symmetric 5 x 5 power. Skin: No rashes, lesions or ulcers Psychiatry: Judgement and insight appear normal. Mood & affect appropriate.     Data Reviewed: I have personally reviewed following labs and imaging studies  CBC: Recent Labs  Lab 04/09/23 2340 04/10/23 0118 04/10/23 1128 04/11/23 0546  WBC 9.3  --   8.4 6.2  NEUTROABS 5.8  --   --   --   HGB 10.8* 11.6* 9.4* 8.3*  HCT 33.4* 34.0* 28.7* 25.3*  MCV 77.1*  --  76.9* 76.0*  PLT 395  --  379 327   Basic Metabolic Panel: Recent Labs  Lab 04/09/23 2340 04/10/23 0118 04/10/23 1128 04/11/23 0546  NA 136 139 134* 138  K 4.4 4.0 4.0 3.8  CL 104  --  106 104  CO2 17*  --  17* 21*  GLUCOSE 101*  --  91 108*  BUN 55*  --  46* 37*  CREATININE 6.82*  --  5.82* 5.23*  CALCIUM 10.3  --  9.4 9.8   GFR: Estimated Creatinine Clearance: 13.9 mL/min (A) (by C-G formula based on SCr of 5.23 mg/dL (H)). Liver Function Tests: Recent Labs  Lab 04/09/23 2340 04/11/23 0546  AST 21 20  ALT 15 11  ALKPHOS 100 68  BILITOT 0.7 0.7  PROT 9.2* 6.7  ALBUMIN 3.9 3.1*   No results for input(s): "LIPASE", "AMYLASE" in the last 168 hours. No results for input(s): "AMMONIA" in the last 168 hours. Coagulation Profile: No results for input(s): "INR", "PROTIME" in the last 168 hours. Cardiac Enzymes: Recent Labs  Lab 04/10/23 1128  CKTOTAL 39   BNP (last 3 results) No results for input(s): "PROBNP" in the last 8760 hours. HbA1C: No results for input(s): "HGBA1C" in the last 72 hours. CBG: Recent Labs  Lab 04/10/23 1613 04/10/23 2103 04/11/23 0629 04/11/23 1128 04/11/23 1607  GLUCAP 174* 121* 109* 102* 105*   Lipid Profile: No results for input(s): "CHOL", "HDL", "LDLCALC", "TRIG", "CHOLHDL", "LDLDIRECT" in the last 72 hours. Thyroid Function Tests: No results for input(s): "TSH", "T4TOTAL", "FREET4", "T3FREE", "THYROIDAB" in the last 72 hours. Anemia Panel: Recent Labs    04/10/23 1128  FERRITIN 73  TIBC 328  IRON 83   Urine analysis:    Component Value Date/Time   COLORURINE YELLOW 04/10/2023 1330   APPEARANCEUR CLEAR 04/10/2023 1330   LABSPEC 1.013 04/10/2023 1330   PHURINE 5.0 04/10/2023 1330   GLUCOSEU NEGATIVE 04/10/2023 1330   HGBUR SMALL (A) 04/10/2023 1330   HGBUR negative 02/14/2010 1032   BILIRUBINUR NEGATIVE  04/10/2023 1330   BILIRUBINUR neg 02/09/2017 1516   KETONESUR 5 (A) 04/10/2023 1330   PROTEINUR 30 (A) 04/10/2023 1330   UROBILINOGEN 0.2 02/09/2017 1516   UROBILINOGEN 1.0 07/05/2012 2349   NITRITE NEGATIVE 04/10/2023 1330   LEUKOCYTESUR NEGATIVE 04/10/2023 1330   Sepsis Labs: @LABRCNTIP (procalcitonin:4,lacticidven:4)  ) Recent Results (from the past 240 hours)  Urine Culture     Status: Abnormal (Preliminary result)   Collection Time: 04/10/23  1:54 AM   Specimen: Urine, Clean Catch  Result Value Ref Range Status   Specimen Description   Final    URINE, CLEAN CATCH Performed at [facility], [address] [place], [place] [address]    Special Requests   Final    NONE Performed at [facility], [address] [place], [place] [address]    Culture (A)  Final    50,000 COLONIES/mL STAPHYLOCOCCUS HAEMOLYTICUS  SUSCEPTIBILITIES TO FOLLOW Performed at [facility], [address] [place], [place] [address]    Report Status PENDING  Incomplete         Radiology Studies: US RENAL Result Date: 04/10/2023 CLINICAL DATA:  Acute renal insufficiency. EXAM: RENAL / URINARY TRACT ULTRASOUND COMPLETE COMPARISON:  CT abdomen pelvis dated 04/10/2023. FINDINGS: Right Kidney: Renal measurements: 11.1 x 5.9 x 5.7 cm = volume: 194 mL. Mild diffuse increased renal parenchymal echogenicity. No hydronephrosis or shadowing stone. Left Kidney: Renal measurements: 10.9 x 5.9 x 5.0 cm = volume: 170 mL. Mild diffuse increased echogenicity. No hydronephrosis or shadowing stone. Bladder: Appears normal for degree of bladder distention. Other: None. IMPRESSION: Mildly echogenic kidneys may represent medical renal disease. No hydronephrosis or shadowing stone. Electronically Signed   By: Elgie Collard M.D.   On: 04/10/2023 18:15   CT ABDOMEN PELVIS WO CONTRAST Result Date: 04/10/2023 CLINICAL DATA:  Acute nonlocalized abdominal pain. Bowel obstruction is suspected. Nausea  and vomiting for 10 days. EXAM: CT ABDOMEN AND PELVIS WITHOUT CONTRAST TECHNIQUE: Multidetector CT imaging of the abdomen and pelvis was performed following the standard protocol without IV contrast. RADIATION DOSE REDUCTION: This exam was performed according to the departmental dose-optimization program which includes automated exposure control, adjustment of the mA and/or kV according to patient size and/or use of iterative reconstruction technique. COMPARISON:  07/10/2017 FINDINGS: Lower chest: Lung bases are clear. Hepatobiliary: No focal liver abnormality is seen. Status post cholecystectomy. No biliary dilatation. Pancreas: Unremarkable. No pancreatic ductal dilatation or surrounding inflammatory changes. Spleen: Normal in size without focal abnormality. Adrenals/Urinary Tract: Adrenal glands are unremarkable. Kidneys are normal, without renal calculi, focal lesion, or hydronephrosis. Bladder is unremarkable. Stomach/Bowel: Stomach, small bowel, and colon are not abnormally distended. No wall thickening or inflammatory changes. Stool throughout the colon. Increased density material in the stomach, some small bowel, and colon likely representing opaque medication. Appendix is normal. Vascular/Lymphatic: No significant vascular findings are present. No enlarged abdominal or pelvic lymph nodes. Reproductive: Nodular appearance of the uterus with uterine calcification consistent with calcified fibroids. No change since prior study. No abnormal adnexal masses. Other: No abdominal wall hernia or abnormality. No abdominopelvic ascites. Musculoskeletal: Mild degenerative changes in the spine. No acute bony abnormalities. IMPRESSION: 1. No acute process demonstrated in the abdomen or pelvis. No evidence of bowel obstruction or inflammation. 2. Uterine fibroids without significant change. Electronically Signed   By: Burman Nieves M.D.   On: 04/10/2023 01:08   CT Head Wo Contrast Result Date: 04/10/2023 CLINICAL  DATA:  Headache with increasing frequency or severity. Headache for 5 days. History of diabetes and hypertension. EXAM: CT HEAD WITHOUT CONTRAST TECHNIQUE: Contiguous axial images were obtained from the base of the skull through the vertex without intravenous contrast. RADIATION DOSE REDUCTION: This exam was performed according to the departmental dose-optimization program which includes automated exposure control, adjustment of the mA and/or kV according to patient size and/or use of iterative reconstruction technique. COMPARISON:  None Available. FINDINGS: Brain: Mild diffuse cerebral atrophy. No ventricular dilatation. Mild low-attenuation change in the periventricular white matter likely representing small vessel ischemia but other demyelinating processes could also have this appearance. Gray-white matter junctions are distinct. Basal cisterns are not effaced. No mass-effect or midline shift. No abnormal extra-axial fluid collections. No acute intracranial hemorrhage. Vascular: No hyperdense vessel or unexpected calcification. Skull: Normal. Negative for fracture or focal lesion. Sinuses/Orbits: No acute finding. Other: None. IMPRESSION: No acute intracranial abnormalities. Mild chronic atrophy and small  vessel ischemic changes. Electronically Signed   By: Burman Nieves M.D.   On: 04/10/2023 01:05        Scheduled Meds:  DULoxetine  60 mg Oral QHS   estradiol  0.1 mg Transdermal Weekly   ferrous sulfate  325 mg Oral BID WC   heparin  5,000 Units Subcutaneous BID   insulin aspart  0-5 Units Subcutaneous QHS   insulin aspart  0-6 Units Subcutaneous TID WC   loratadine  10 mg Oral Daily   metoprolol succinate  50 mg Oral Daily   progesterone  100 mg Oral QHS   rosuvastatin  40 mg Oral QHS   [START ON 04/12/2023] Vitamin D (Ergocalciferol)  50,000 Units Oral Q Sun   Continuous Infusions:  sodium chloride 100 mL/hr at 04/11/23 1657     LOS: 1 day    Time spent: 35  minutes.    Berton Mount, MD  [facility] Pager #: [phone] 7PM-7AM contact night coverage as above

## 2023-04-11 NOTE — Consult Note (Addendum)
 [facility] Nephrology Consultation Note  Requesting MD: Dr. Berton Mount Reason for consult: AKI  HPI:  Cheryl Hart is a 48 y.o. female with past medical history significant for hypertension, dyslipidemia, who presents for GERD, arthritis, type II DM, presented with nausea vomiting for about 10 days.  The patient stated she has been taking ibuprofen around 4 to 5 tablets almost every day and sometimes with BC powder per for last 2 years.  She is also on losartan, metformin at home.  In the ER, the serum creatinine level was elevated to 6.82, BUN 55, potassium 4.4, CO2 17, albumin 3.9, hemoglobin 10.8.  Total protein was 9.2.  Kidney ultrasound showed mildly echogenic kidney without hydronephrosis.  The urinalysis with chronic protein 30, otherwise bland.  The team has sent C3, C4, ANA.  CK is 39. The patient received IV fluid hydration and creatinine level trending down to 5.23 today. Today, she denies nausea, vomiting, chest pain, shortness of breath.  Reports feeling good at home. She was accompanied by her uncle at the bedside. Her mother had a kidney transplant and that she passed away due to COVID infection.  PMHx:   Past Medical History:  Diagnosis Date   Anemia    Anxiety    Arthritis    KNEE RIGHT   Blood transfusion without reported diagnosis    Depression    Diabetes mellitus    History of kidney stones    Hyperlipidemia    Hypertension    PT.DENIES,MEDS FOR KIDNEY AND PALPATIONS   PCOS (polycystic ovarian syndrome)    Uterine fibroid    Vitamin B12 deficiency     Past Surgical History:  Procedure Laterality Date   CESAREAN SECTION     CHOLECYSTECTOMY     g1 p1     LAPAROSCOPIC [place] ASSISTED MYOMECTOMY  02/12/2015   Dr Bonnielee Haff   TOTAL KNEE ARTHROPLASTY Right 03/05/2022   Procedure: RIGHT TOTAL KNEE ARTHROPLASTY;  Surgeon: Nadara Mustard, MD;  Location: [facility] OR;  Service: Orthopedics;  Laterality: Right;    Family Hx:  Family History  Problem  Relation Age of Onset   Crohn's disease Mother    Diabetes Mother    Hypertension Mother    Hyperlipidemia Mother    Kidney disease Mother    Thyroid disease Mother    Anxiety disorder Mother    Diabetes Father    Hypertension Father    Anxiety disorder Father    Liver disease Father    Alcoholism Father    Drug abuse Father    Depression Father    Breast cancer Maternal Grandmother 58   Diabetes Brother    Migraines Other    Colon cancer Neg Hx    Colon polyps Neg Hx    Esophageal cancer Neg Hx    Rectal cancer Neg Hx    Stomach cancer Neg Hx    Ulcerative colitis Neg Hx     Social History:  reports that she has never smoked. She has never been exposed to tobacco smoke. She has never used smokeless tobacco. She reports that she does not drink alcohol and does not use drugs.  Allergies:  Allergies  Allergen Reactions   Sulfa Antibiotics Anaphylaxis and Rash   Sulfonamide Derivatives Anaphylaxis, Photosensitivity, Rash and Other (See Comments)    Severe headache    Medications: Prior to Admission medications   Medication Sig Start Date End Date Taking? Authorizing Provider  ALPRAZolam (XANAX) 0.25 MG tablet Take 1 tablet (0.25 mg total)  by mouth 3 (three) times daily as needed. 03/27/23  Yes Donato Schultz, DO  Aspirin-Salicylamide-Caffeine (BC HEADACHE POWDER PO) Take 1 packet by mouth daily as needed (headache).   Yes [provider]  diphenhydrAMINE (BENADRYL) 25 MG tablet Take 25 mg by mouth at bedtime as needed for sleep.   Yes [provider]  Dulaglutide (TRULICITY) 3 MG/0.5ML SOAJ Inject 3 mg as directed once a week. Patient taking differently: Inject 3 mg as directed every Thursday. 01/08/23  Yes Shamleffer, Konrad Dolores, MD  DULoxetine (CYMBALTA) 60 MG capsule Take 1 capsule (60 mg total) by mouth daily. Patient taking differently: Take 60 mg by mouth at bedtime. 03/27/23  Yes Seabron Spates R, DO  estradiol (VIVELLE-DOT) 0.075  MG/24HR Place 1 patch onto the skin 2 (two) times a week. Patient taking differently: Place 1 patch onto the skin See admin instructions. Apply 1 patch topically twice a week on Tuesday and Friday. 02/09/23  Yes Chrzanowski, Jami B, NP  losartan (COZAAR) 25 MG tablet Take 1 tablet (25 mg total) by mouth daily. Pt needs office visit for further refills 03/18/23  Yes Lowne Irish Elders, DO  metFORMIN (GLUCOPHAGE) 850 MG tablet Take 1 tablet (850 mg total) by mouth 2 (two) times daily with a meal. 01/08/23  Yes Shamleffer, Konrad Dolores, MD  metoprolol succinate (TOPROL-XL) 50 MG 24 hr tablet Take 1 tablet (50 mg total) by mouth daily. Take with or immediately following a meal. Patient taking differently: Take 50 mg by mouth daily as needed (palpitations). 11/11/21  Yes Donato Schultz, DO  Multiple Vitamins-Minerals (WOMENS MULTIVITAMIN GUMMIES) CHEW Chew 2 each by mouth daily.   Yes [provider]  naproxen sodium (ALEVE) 220 MG tablet Take 440 mg by mouth 2 (two) times daily as needed (knee pain, stiffness).   Yes [provider]  nystatin [facility]) powder Apply 1 application topically 3 (three) times daily. Patient taking differently: Apply 1 Application topically 3 (three) times daily as needed (skin irritaiton). 05/19/22  Yes Seabron Spates R, DO  ondansetron (ZOFRAN-ODT) 4 MG disintegrating tablet Dissolve 1 tablet (4 mg total) in mouth every 8 (eight) hours as needed for nausea or vomiting. 03/27/23  Yes Adonis Huguenin, NP  progesterone (PROMETRIUM) 100 MG capsule Take 1 capsule (100 mg total) by mouth daily. Patient taking differently: Take 100 mg by mouth at bedtime. 05/02/22  Yes Chrzanowski, Jami B, NP  rosuvastatin (CRESTOR) 40 MG tablet Take 1 tablet (40 mg total) by mouth daily. Patient taking differently: Take 40 mg by mouth at bedtime. 03/27/23  Yes Donato Schultz, DO  Vitamin D, Ergocalciferol, (DRISDOL) 1.25 MG (50000 UNIT) CAPS capsule Take 1 capsule  (50,000 Units total) by mouth every 7 (seven) days. Patient taking differently: Take 50,000 Units by mouth every Sunday. 02/23/23  Yes Seabron Spates R, DO  Blood Glucose Monitoring Suppl (BLOOD GLUCOSE MONITOR SYSTEM) w/Device KIT use as drected to check blood sugar 04/30/20   Shamleffer, Konrad Dolores, MD  glucose blood (FREESTYLE LITE) test strip Use as instructed to check blood sugar 2 times daily 04/30/20   Shamleffer, Konrad Dolores, MD    I have reviewed the patient's current medications.  Labs: Renal Panel: Recent Labs  Lab 04/09/23 2340 04/10/23 0118 04/10/23 1128 04/11/23 0546  NA 136 139 134* 138  K 4.4 4.0 4.0 3.8  CL 104  --  106 104  CO2 17*  --  17* 21*  GLUCOSE 101*  --  91 108*  BUN 55*  --  46* 37*  CREATININE 6.82*  --  5.82* 5.23*  CALCIUM 10.3  --  9.4 9.8     CBC:    Latest Ref Rng & Units 04/11/2023    5:46 AM 04/10/2023   11:28 AM 04/10/2023    1:18 AM  CBC  WBC 4.0 - 10.5 K/uL 6.2  8.4    Hemoglobin 12.0 - 15.0 g/dL 8.3  9.4  16.1   Hematocrit 36.0 - 46.0 % 25.3  28.7  34.0   Platelets 150 - 400 K/uL 327  379       Anemia Panel:  Recent Labs    04/09/23 2340 04/10/23 0118 04/10/23 1128 04/11/23 0546  HGB 10.8* 11.6* 9.4* 8.3*  MCV 77.1*  --  76.9* 76.0*  FERRITIN  --   --  73  --   TIBC  --   --  328  --   IRON  --   --  83  --     Recent Labs  Lab 04/09/23 2340  AST 21  ALT 15  ALKPHOS 100  BILITOT 0.7  PROT 9.2*  ALBUMIN 3.9    Lab Results  Component Value Date   HGBA1C 5.5 01/08/2023    ROS:  Pertinent items noted in HPI and remainder of comprehensive ROS otherwise negative.  Physical Exam: Vitals:   04/11/23 0400 04/11/23 0805  BP: 119/78 134/71  Pulse: 89 89  Resp: 18 14  Temp:  99 F (37.2 C)  SpO2: 99% 100%     General exam: Appears calm and comfortable  Respiratory system: Clear to auscultation. Respiratory effort normal. No wheezing or crackle Cardiovascular system: S1 & S2 heard, RRR.  No pedal  edema. Gastrointestinal system: Abdomen is nondistended, soft and nontender. Normal bowel sounds heard. Central nervous system: Alert and oriented. No focal neurological deficits. Extremities: No edema, no cyanosis. Skin: No rashes, lesions or ulcers Psychiatry: Judgement and insight appear normal. Mood & affect appropriate.   Assessment/Plan:  # Acute kidney injury versus AKI on underlying CKD: b/l cr 0.64 in 02/2022.  AKI likely due to analgesic nephropathy/dehydration with the concomitant use of losartan.  UA unremarkable.  Kidney ultrasound with mild bilateral chronicity but no hydronephrosis. She received IV fluid with downtrending of serum creatinine level.  She has no sign or symptoms of uremia status looks acceptable.  No need for dialysis. Pending ANA, C3, C4. Given AKI, anemia with high total  serum protein, I like to rule out paraproteinemia by checking protein electrophoresis and kappa lambda ratio. Please hold losartan and metformin. Educated her about avoiding NSAIDs, BC powder. If she is being discharged from hospital then she will need to follow-up with [place] after [facility] office.  # Anemia: Drop in hemoglobin noted from admission likely due to IV fluid.  No sign of bleeding.  Iron saturation 25 with serum iron of 83. I will start po iron. Hold ESA while work up of paraproteinemia.   # Hypertension: Blood pressure acceptable.  Currently on metoprolol.  Advised to hold ACE inhibitor or ARB.  # Type II DM: Please hold metformin with low GFR.  Follow-up with PCP.  # Nausea and vomiting: Feels better today without any symptoms.  Continue symptomatic treatment per primary team.  Thank you for the consult, we will continue to follow with you.  Martavis Gurney Jaynie Collins 04/11/2023, 2:43 PM  [facility].

## 2023-04-11 NOTE — Plan of Care (Signed)

## 2023-04-12 DIAGNOSIS — N179 Acute kidney failure, unspecified: Secondary | ICD-10-CM | POA: Diagnosis not present

## 2023-04-12 LAB — URINE CULTURE: Culture: 50000 — AB

## 2023-04-12 LAB — RENAL FUNCTION PANEL
Albumin: 3 g/dL — ABNORMAL LOW (ref 3.5–5.0)
Anion gap: 11 (ref 5–15)
BUN: 30 mg/dL — ABNORMAL HIGH (ref 6–20)
CO2: 20 mmol/L — ABNORMAL LOW (ref 22–32)
Calcium: 9.4 mg/dL (ref 8.9–10.3)
Chloride: 108 mmol/L (ref 98–111)
Creatinine, Ser: 4.21 mg/dL — ABNORMAL HIGH (ref 0.44–1.00)
GFR, Estimated: 12 mL/min — ABNORMAL LOW (ref >60)
Glucose, Bld: 185 mg/dL — ABNORMAL HIGH (ref 70–99)
Phosphorus: 3.3 mg/dL (ref 2.5–4.6)
Potassium: 3.7 mmol/L (ref 3.5–5.1)
Sodium: 139 mmol/L (ref 135–145)

## 2023-04-12 LAB — GLUCOSE, CAPILLARY
Glucose-Capillary: 97 mg/dL (ref 70–99)
Glucose-Capillary: 126 mg/dL — ABNORMAL HIGH (ref 70–99)

## 2023-04-12 LAB — C4 COMPLEMENT: Complement C4, Body Fluid: 65 mg/dL — ABNORMAL HIGH (ref 12–38)

## 2023-04-12 LAB — C3 COMPLEMENT: C3 Complement: 193 mg/dL — ABNORMAL HIGH (ref 82–167)

## 2023-04-12 MED ORDER — ACETAMINOPHEN 325 MG PO TABS: 650.0000 mg | ORAL_TABLET | Freq: Four times a day (QID) | ORAL | Status: AC | PRN

## 2023-04-12 MED ORDER — LORATADINE 10 MG PO TABS
10.0000 mg | ORAL_TABLET | Freq: Every day | ORAL | 0 refills | Status: AC
  Filled 2023-04-12: qty 30, 30d supply, fill #0

## 2023-04-12 MED ORDER — FERROUS SULFATE 325 (65 FE) MG PO TABS
325.0000 mg | ORAL_TABLET | Freq: Two times a day (BID) | ORAL | 1 refills | Status: AC
  Filled 2023-04-12: qty 30, 15d supply, fill #0
  Filled 2023-05-05: qty 30, 15d supply, fill #1

## 2023-04-12 NOTE — Progress Notes (Signed)
 Cheryl Hart NEPHROLOGY PROGRESS NOTE  Assessment/ Plan: Pt is a 48 y.o. yo female  with past medical history significant for hypertension, dyslipidemia, who presents for GERD, arthritis, type II DM, presented with nausea vomiting for about 10 days, seen as a consultation for AKI.  # Acute kidney injury versus AKI on underlying CKD: b/l cr 0.64 in 02/2022.  AKI likely due to analgesic nephropathy/dehydration with the concomitant use of losartan.  UPC 0.7g.  Kidney ultrasound with mild bilateral chronicity but no hydronephrosis. She received IV fluid with downtrending of serum creatinine level.  She has no sign or symptoms of uremia status looks acceptable.  No need for dialysis. ANA negative, pending C3-C4, protein electrophoresis and kappa lambda ratio. Please hold losartan and metformin. Educated her about avoiding NSAIDs, BC powder. If she is being discharged from hospital then she will need to follow-up with [place] after [facility] office.  Information provided to the patient and I will send message to the office.   # Anemia: Drop in hemoglobin noted from admission likely due to IV fluid.  No sign of bleeding.  Iron saturation 25 with serum iron of 83. I will start po iron.    # Hypertension: Blood pressure acceptable.  Currently on metoprolol.  Advised to hold ACE inhibitor or ARB.   # Type II DM: Please hold metformin with low GFR.  Follow-up with PCP.   # Nausea and vomiting: Feels better today without any symptoms.  Continue symptomatic treatment per primary team.  Ok to discharge from renal perspective.  Sign off, please call us back with question.  Subjective: Seen and examined at the bedside.  The urine output is around 2 L.  Denies nausea, vomiting, chest pain, shortness of breath.  No new event. Objective Vital signs in last 24 hours: Vitals:   04/11/23 1528 04/11/23 1935 04/12/23 0354 04/12/23 0805  BP: 127/89 122/78 (P) 134/81 131/83  Pulse: 84 85 (P) 87 91   Resp: 16 17 (P) 18 14  Temp: 98.6 F (37 C) 98.4 F (36.9 C) (P) 98 F (36.7 C) 98.2 F (36.8 C)  TempSrc: Oral Oral (P) Oral Oral  SpO2: 100% 100% (P) 100% 100%  Weight:      Height:       Weight change:   Intake/Output Summary (Last 24 hours) at 04/12/2023 1146 Last data filed at 04/12/2023 1144 Gross per 24 hour  Intake 2592.32 ml  Output 1500 ml  Net 1092.32 ml       Labs: RENAL PANEL Recent Labs  Lab 04/09/23 2340 04/10/23 0118 04/10/23 1128 04/11/23 0546 04/12/23 0809  NA 136 139 134* 138 139  K 4.4 4.0 4.0 3.8 3.7  CL 104  --  106 104 108  CO2 17*  --  17* 21* 20*  GLUCOSE 101*  --  91 108* 185*  BUN 55*  --  46* 37* 30*  CREATININE 6.82*  --  5.82* 5.23* 4.21*  CALCIUM 10.3  --  9.4 9.8 9.4  PHOS  --   --   --   --  3.3  ALBUMIN 3.9  --   --  3.1* 3.0*    Liver Function Tests: Recent Labs  Lab 04/09/23 2340 04/11/23 0546 04/12/23 0809  AST 21 20  --   ALT 15 11  --   ALKPHOS 100 68  --   BILITOT 0.7 0.7  --   PROT 9.2* 6.7  --   ALBUMIN 3.9 3.1* 3.0*  No results for input(s): "LIPASE", "AMYLASE" in the last 168 hours. No results for input(s): "AMMONIA" in the last 168 hours. CBC: Recent Labs    04/09/23 2340 04/10/23 0118 04/10/23 1128 04/11/23 0546  HGB 10.8* 11.6* 9.4* 8.3*  MCV 77.1*  --  76.9* 76.0*  FERRITIN  --   --  73  --   TIBC  --   --  328  --   IRON  --   --  83  --     Cardiac Enzymes: Recent Labs  Lab 04/10/23 1128  CKTOTAL 39   CBG: Recent Labs  Lab 04/11/23 1128 04/11/23 1607 04/11/23 2208 04/12/23 0620 04/12/23 1133  GLUCAP 102* 105* 103* 97 126*    Iron Studies:  Recent Labs    04/10/23 1128  IRON 83  TIBC 328  FERRITIN 73   Studies/Results: US RENAL Result Date: 04/10/2023 CLINICAL DATA:  Acute renal insufficiency. EXAM: RENAL / URINARY TRACT ULTRASOUND COMPLETE COMPARISON:  CT abdomen pelvis dated 04/10/2023. FINDINGS: Right Kidney: Renal measurements: 11.1 x 5.9 x 5.7 cm = volume: 194 mL.  Mild diffuse increased renal parenchymal echogenicity. No hydronephrosis or shadowing stone. Left Kidney: Renal measurements: 10.9 x 5.9 x 5.0 cm = volume: 170 mL. Mild diffuse increased echogenicity. No hydronephrosis or shadowing stone. Bladder: Appears normal for degree of bladder distention. Other: None. IMPRESSION: Mildly echogenic kidneys may represent medical renal disease. No hydronephrosis or shadowing stone. Electronically Signed   By: Elgie Collard M.D.   On: 04/10/2023 18:15    Medications: Infusions:  sodium chloride 100 mL/hr at 04/12/23 1144    Scheduled Medications:  DULoxetine  60 mg Oral QHS   estradiol  0.1 mg Transdermal Weekly   ferrous sulfate  325 mg Oral BID WC   heparin  5,000 Units Subcutaneous BID   insulin aspart  0-5 Units Subcutaneous QHS   insulin aspart  0-6 Units Subcutaneous TID WC   loratadine  10 mg Oral Daily   metoprolol succinate  50 mg Oral Daily   progesterone  100 mg Oral QHS   rosuvastatin  40 mg Oral QHS   Vitamin D (Ergocalciferol)  50,000 Units Oral Q Sun    have reviewed scheduled and prn medications.  Physical Exam: General:NAD, comfortable Heart:RRR, s1s2 nl Lungs:clear b/l, no crackle Abdomen:soft, Non-tender, non-distended Extremities:No edema Neurology: Alert awake and following commands  Cabela Pacifico Prasad Amil Moseman 04/12/2023,11:46 AM  LOS: 2 days

## 2023-04-12 NOTE — Discharge Summary (Signed)
 Physician Discharge Summary  Patient ID: Cheryl Hart MRN: [id number] DOB/AGE: [birth year] 48 y.o.  Admit date: 04/09/2023 Discharge date: 04/12/2023  Admission Diagnoses:  Discharge Diagnoses:  Principal Problem:   AKI (acute kidney injury) versus acute kidney injury on chronic kidney disease   Discharged Condition: stable  Hospital Course:  Patient is a 48 year old female with past medical history significant for diabetes mellitus type 2 since 2010, anemia, hypertension and hyperlipidemia.  Prior to admission, patient was on metformin 850 Mg p.o. twice daily and Trulicity injection 3 Mg every week (Thursday) for diabetes mellitus management, and losartan for hypertension.  Patient had been on NSAIDs (ibuprofen and Aleve) almost daily for the last 2 years.  Patient developed nausea and vomiting about 10 days prior to presentation.  Last vomiting episode was on the night of presentation.  Patient reported associated headache prior to presentation for which patient had BC powder intermittently.  On presentation, patient was found to have BUN of 55, serum creatinine of 6.82, CO2 of 17 and EGFR of 7 mL/min/1.73 m (baseline serum creatinine of 0.64, BUN of 6 and CO2 of 23).  Patient was admitted for further assessment and management of acute kidney injury.  NSAIDs were held.  Nephrotoxins were held.  Patient was aggressively hydrated.  Serum creatinine improved to 4.21 prior to discharge.  Nephrology team was consulted to assist with patient's management.  Patient will follow-up with nephrology team, primary care provider and endocrinology team.  Patient will have BMP checked in 2 days time.  Nephrology team is cleared patient for discharge.  Acute kidney injury versus acute kidney injury on chronic kidney disease: -Etiology is likely multifactorial. -Patient had nausea vomiting for about 10 days prior to presentation. -Patient had been on NSAIDs for about 2 years.  Patient increased NSAIDs use  recently due to headache. -Prior to presentation, patient was on metformin and losartan. -Patient was aggressively volume resuscitated during the hospital stay. -Nephrotoxins were held. -Patient has been counseled to quit and is a send out and nephrotoxins. -Metformin and losartan will be on hold on discharge. -Follow-up with primary care provider, nephrology team and primary care provider on discharge. -Repeat BMP in 2 days. -Renal ultrasound revealed evidence of medical renal disease.   -CK of 39.   -Urine sodium of 51. -Urinalysis revealed ketones of 5, specific gravity of 1.013 and 30 of protein. -Check urine protein creatinine ratio. -Protein of 9.2 is likely secondary to volume depletion.  Repeat protein level was 6.7.   Nausea and vomiting: -Patient had nausea vomiting for about 10 days. -Nausea and vomiting have resolved.   Volume depletion: -Patient was aggressively volume resuscitated.      Type 2 diabetes mellitus: -Hold metformin. -Last A1c was 5.5%.   Hypertension: -Blood pressure is controlled.   Hyperlipidemia: -Continue Crestor   Acidosis: -Likely secondary to acute kidney injury. -CO2 was 20 on the day of discharge.       Consults: nephrology  Significant Diagnostic Studies:  Serum creatinine on presentation was 6.82 (baseline serum creatinine of 0.64 based on lab work done on 02/20/2022).  Serum creatinine prior to discharge was 4.21.  Renal ultrasound revealed: Mildly echogenic kidneys may represent medical renal disease. No hydronephrosis or shadowing stone.   Discharge Exam: Blood pressure 131/83, pulse 91, temperature 98.2 F (36.8 C), temperature source Oral, resp. rate 14, height 5\' 6"  (1.676 m), weight 80.7 kg, SpO2 100%.   Disposition: Discharge disposition: 01-Home or Self Care       Discharge  Instructions     Diet - low sodium heart healthy   Complete by: As directed    Increase activity slowly   Complete by: As directed        Allergies as of 04/12/2023       Reactions   Sulfa Antibiotics Anaphylaxis, Rash   Sulfonamide Derivatives Anaphylaxis, Photosensitivity, Rash, Other (See Comments)   Severe headache        Medication List     STOP taking these medications    BC HEADACHE POWDER PO   diphenhydrAMINE 25 MG tablet Commonly known as: BENADRYL   losartan 25 MG tablet Commonly known as: COZAAR   metFORMIN 850 MG tablet Commonly known as: GLUCOPHAGE   naproxen sodium 220 MG tablet Commonly known as: ALEVE   Nyamyc powder Generic drug: nystatin   ondansetron 4 MG disintegrating tablet Commonly known as: ZOFRAN-ODT       TAKE these medications    acetaminophen 325 MG tablet Commonly known as: TYLENOL Take 2 tablets (650 mg total) by mouth every 6 (six) hours as needed for mild pain (pain score 1-3), headache or moderate pain (pain score 4-6).   ALPRAZolam 0.25 MG tablet Commonly known as: XANAX Take 1 tablet (0.25 mg total) by mouth 3 (three) times daily as needed.   Blood Glucose Monitor System w/Device Kit use as drected to check blood sugar   DULoxetine 60 MG capsule Commonly known as: CYMBALTA Take 1 capsule (60 mg total) by mouth daily. What changed: when to take this   estradiol 0.075 MG/24HR Commonly known as: VIVELLE-DOT Place 1 patch onto the skin 2 (two) times a week. What changed:  when to take this additional instructions   ferrous sulfate 325 (65 FE) MG tablet Take 1 tablet (325 mg total) by mouth 2 (two) times daily with a meal.   FREESTYLE LITE test strip Generic drug: glucose blood Use as instructed to check blood sugar 2 times daily   loratadine 10 MG tablet Commonly known as: CLARITIN Take 1 tablet (10 mg total) by mouth daily. Start taking on: April 13, 2023   metoprolol succinate 50 MG 24 hr tablet Commonly known as: TOPROL-XL Take 1 tablet (50 mg total) by mouth daily. Take with or immediately following a meal. What changed:  when to take  this reasons to take this additional instructions   progesterone 100 MG capsule Commonly known as: PROMETRIUM Take 1 capsule (100 mg total) by mouth daily. What changed: when to take this   rosuvastatin 40 MG tablet Commonly known as: CRESTOR Take 1 tablet (40 mg total) by mouth daily. What changed: when to take this   Trulicity 3 MG/0.5ML Soaj Generic drug: Dulaglutide Inject 3 mg as directed once a week. What changed: when to take this   Vitamin D (Ergocalciferol) 1.25 MG (50000 UNIT) Caps capsule Commonly known as: DRISDOL Take 1 capsule (50,000 Units total) by mouth every 7 (seven) days. What changed: when to take this   Womens Multivitamin Gummies Chew Chew 2 each by mouth daily.        Time spent: 35 minutes.  SignedBarnetta Chapel 04/12/2023, 3:12 PM

## 2023-04-12 NOTE — Plan of Care (Signed)

## 2023-04-12 NOTE — Progress Notes (Signed)
 Patient also has nephrology office information and aware to follow up with nephrology. Dr. Ronalee Belts gave card with information.

## 2023-04-12 NOTE — Progress Notes (Signed)
 AVS education done with Lajoyce Corners, pt aware of nephrology to call for follow up appointment, pt aware of picking up prescription at pharmacy, education on reasons to contact MD or call 911. Pt verbalized understanding of all education she called [employer] for transport home. Transport chair to lobby by staff.

## 2023-04-13 ENCOUNTER — Other Ambulatory Visit (HOSPITAL_COMMUNITY): Payer: Self-pay

## 2023-04-13 LAB — KAPPA/LAMBDA LIGHT CHAINS
Kappa free light chain: 64.6 mg/L — ABNORMAL HIGH (ref 3.3–19.4)
Kappa, lambda light chain ratio: 1.47 (ref 0.26–1.65)
Lambda free light chains: 44 mg/L — ABNORMAL HIGH (ref 5.7–26.3)

## 2023-04-14 LAB — PROTEIN ELECTROPHORESIS, SERUM
A/G Ratio: 0.8 (ref 0.7–1.7)
Albumin ELP: 3.2 g/dL (ref 2.9–4.4)
Alpha-1-Globulin: 0.3 g/dL (ref 0.0–0.4)
Alpha-2-Globulin: 0.9 g/dL (ref 0.4–1.0)
Beta Globulin: 1.2 g/dL (ref 0.7–1.3)
Gamma Globulin: 1.3 g/dL (ref 0.4–1.8)
Globulin, Total: 3.8 g/dL (ref 2.2–3.9)
Total Protein ELP: 7 g/dL (ref 6.0–8.5)

## 2023-04-20 ENCOUNTER — Encounter: Payer: Self-pay | Admitting: Internal Medicine

## 2023-04-20 ENCOUNTER — Telehealth: Payer: Self-pay | Admitting: Internal Medicine

## 2023-04-20 NOTE — Telephone Encounter (Signed)
 Patient is calling to ask if she can wait until her June 2025 appointment to come.  Patient states that she was in the hospital from 04/09/2023 until 04/12/2023.   Patient states that while at the hospital they took her off of Metformin.  Patient is asking if she should restart the Metformin also.

## 2023-04-21 ENCOUNTER — Other Ambulatory Visit: Payer: Self-pay

## 2023-04-21 ENCOUNTER — Other Ambulatory Visit (HOSPITAL_COMMUNITY): Payer: Self-pay

## 2023-04-21 MED ORDER — DEXCOM G7 SENSOR MISC
11 refills | Status: DC
  Filled 2023-04-21: qty 3, 30d supply, fill #0
  Filled 2023-05-22: qty 3, 30d supply, fill #1

## 2023-04-22 ENCOUNTER — Ambulatory Visit: Admitting: Internal Medicine

## 2023-04-23 ENCOUNTER — Ambulatory Visit: Admitting: Radiology

## 2023-04-26 ENCOUNTER — Other Ambulatory Visit: Payer: Self-pay | Admitting: Family Medicine

## 2023-04-26 ENCOUNTER — Other Ambulatory Visit: Payer: Self-pay | Admitting: Radiology

## 2023-04-26 DIAGNOSIS — F339 Major depressive disorder, recurrent, unspecified: Secondary | ICD-10-CM

## 2023-04-26 DIAGNOSIS — E1142 Type 2 diabetes mellitus with diabetic polyneuropathy: Secondary | ICD-10-CM

## 2023-04-26 DIAGNOSIS — Z79890 Hormone replacement therapy (postmenopausal): Secondary | ICD-10-CM

## 2023-04-27 ENCOUNTER — Other Ambulatory Visit: Payer: Self-pay

## 2023-04-27 NOTE — Telephone Encounter (Signed)
 Med refill request: estradiol patch  Last AEX: 11/26/21 Next AEX: 06/16/23 Last MMG (if hormonal med) 08/05/22 birads cat 1 neg Refill authorized: last rx 02/09/23 #24 with 0 refills. Please approve or deny

## 2023-04-28 ENCOUNTER — Encounter: Payer: Self-pay | Admitting: Family Medicine

## 2023-04-28 ENCOUNTER — Other Ambulatory Visit (HOSPITAL_COMMUNITY): Payer: Self-pay

## 2023-04-28 ENCOUNTER — Encounter (HOSPITAL_COMMUNITY): Payer: Self-pay

## 2023-04-28 DIAGNOSIS — E1142 Type 2 diabetes mellitus with diabetic polyneuropathy: Secondary | ICD-10-CM

## 2023-04-28 DIAGNOSIS — F339 Major depressive disorder, recurrent, unspecified: Secondary | ICD-10-CM

## 2023-04-28 MED ORDER — ESTRADIOL 0.075 MG/24HR TD PTTW
1.0000 | MEDICATED_PATCH | TRANSDERMAL | 0 refills | Status: DC
  Filled 2023-04-28: qty 24, 84d supply, fill #0

## 2023-04-28 MED ORDER — DULOXETINE HCL 60 MG PO CPEP
60.0000 mg | ORAL_CAPSULE | Freq: Every day | ORAL | 0 refills | Status: DC
  Filled 2023-04-28: qty 30, 30d supply, fill #0

## 2023-04-29 ENCOUNTER — Other Ambulatory Visit (HOSPITAL_COMMUNITY): Payer: Self-pay

## 2023-05-05 ENCOUNTER — Other Ambulatory Visit (HOSPITAL_COMMUNITY): Payer: Self-pay

## 2023-05-05 ENCOUNTER — Other Ambulatory Visit: Payer: Self-pay | Admitting: Family Medicine

## 2023-05-05 DIAGNOSIS — E785 Hyperlipidemia, unspecified: Secondary | ICD-10-CM

## 2023-05-05 MED ORDER — ROSUVASTATIN CALCIUM 40 MG PO TABS
40.0000 mg | ORAL_TABLET | Freq: Every day | ORAL | 0 refills | Status: DC
  Filled 2023-05-05: qty 14, 14d supply, fill #0

## 2023-05-12 DIAGNOSIS — E1122 Type 2 diabetes mellitus with diabetic chronic kidney disease: Secondary | ICD-10-CM | POA: Diagnosis not present

## 2023-05-12 DIAGNOSIS — I129 Hypertensive chronic kidney disease with stage 1 through stage 4 chronic kidney disease, or unspecified chronic kidney disease: Secondary | ICD-10-CM | POA: Diagnosis not present

## 2023-05-12 DIAGNOSIS — N189 Chronic kidney disease, unspecified: Secondary | ICD-10-CM | POA: Diagnosis not present

## 2023-05-12 DIAGNOSIS — N179 Acute kidney failure, unspecified: Secondary | ICD-10-CM | POA: Diagnosis not present

## 2023-05-13 LAB — LAB REPORT - SCANNED
Calcium: 10.4
EGFR: 29
PTH: 61

## 2023-05-21 ENCOUNTER — Encounter: Payer: Self-pay | Admitting: Internal Medicine

## 2023-05-21 ENCOUNTER — Ambulatory Visit: Admitting: Internal Medicine

## 2023-05-21 ENCOUNTER — Other Ambulatory Visit (HOSPITAL_COMMUNITY): Payer: Self-pay

## 2023-05-21 VITALS — BP 100/74 | HR 99 | Ht 66.0 in | Wt 166.8 lb

## 2023-05-21 DIAGNOSIS — E119 Type 2 diabetes mellitus without complications: Secondary | ICD-10-CM

## 2023-05-21 DIAGNOSIS — Z7984 Long term (current) use of oral hypoglycemic drugs: Secondary | ICD-10-CM

## 2023-05-21 DIAGNOSIS — E785 Hyperlipidemia, unspecified: Secondary | ICD-10-CM | POA: Diagnosis not present

## 2023-05-21 DIAGNOSIS — Z7985 Long-term (current) use of injectable non-insulin antidiabetic drugs: Secondary | ICD-10-CM | POA: Diagnosis not present

## 2023-05-21 LAB — POCT GLYCOSYLATED HEMOGLOBIN (HGB A1C): Hemoglobin A1C: 6.3 % — AB (ref 4.0–5.6)

## 2023-05-21 MED ORDER — GLIMEPIRIDE 1 MG PO TABS
1.0000 mg | ORAL_TABLET | Freq: Every day | ORAL | 3 refills | Status: DC
  Filled 2023-05-21: qty 90, 90d supply, fill #0

## 2023-05-21 MED ORDER — TRULICITY 3 MG/0.5ML ~~LOC~~ SOAJ
3.0000 mg | SUBCUTANEOUS | 3 refills | Status: AC
  Filled 2023-05-21 – 2023-06-12 (×2): qty 2, 28d supply, fill #0
  Filled 2023-07-06: qty 2, 28d supply, fill #1
  Filled 2023-07-28: qty 2, 28d supply, fill #2
  Filled 2023-08-27 – 2023-09-04 (×2): qty 2, 28d supply, fill #3
  Filled 2023-09-20 – 2023-09-25 (×4): qty 2, 28d supply, fill #4
  Filled 2023-10-27: qty 2, 28d supply, fill #5
  Filled 2023-12-16: qty 2, 28d supply, fill #6
  Filled 2024-01-12 – 2024-01-26 (×2): qty 2, 28d supply, fill #7
  Filled 2024-02-19: qty 2, 28d supply, fill #8

## 2023-05-21 NOTE — Progress Notes (Signed)
 Name: Cheryl Hart  Age/ Sex: 48 y.o., female   MRN/ DOB: [id number], 10-19-1975     PCP: Estill Hemming, DO   Reason for Endocrinology Evaluation: Type 2 Diabetes Mellitus  Initial Endocrine Consultative Visit: 12/01/2018    PATIENT IDENTIFIER: [name] is a 48 y.o. female with a past medical history of HTN, T2DM, and dyslipidemia  . The patient has followed with Endocrinology clinic since 12/01/2018 for consultative assistance with management of her diabetes.  DIABETIC HISTORY:  [name] was diagnosed with DM in 2010. She has tried invokana  but didn't;t feel good on it , has been unable to afford Januvia  or victoza  in the past.  Her hemoglobin A1c has ranged from 7.4% in 2018, peaking at 11.1% in 2019.   On her initial visit to our clinic she had an A1c of 9.0%  , she was on Glimepiride  and metformin  . 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     Today (05/21/2023): [name] is here for a f/u on diabetes.She checks glucose 0 x daily.     Patient presented to the ED 03/2023 with AKI with symptoms of nausea and vomiting, was noted to have a BUN of 55 and a serum creatinine of 6.82 and a GFR of 7, she has already been on metformin , Trulicity , and NSAIDs daily for approximately 2 years prior to her presentation, she was aggressively hydrated, nephrology team was consulted  Metformin  and losartan  were held  Denies nausea or vomiting , appetite is back to normal  Has occasional constipation but no  diarrhea    HOME DIABETES REGIMEN:  Trulicity  3 mg weekly Rosuvastatin  40 mg daily    Statin: Yes ACE-I/ARB: yes   CONTINUOUS  GLUCOSE MONITORING RECORD INTERPRETATION    Dates of Recording: 4/25-05/21/2023  Sensor description:dexcom  Results statistics:   CGM use % of time 84  Average and SD 155/30  Time in range    82    %  % Time Above 180 17  % Time above 250 1  % Time Below target 0   Glycemic patterns summary: BGs are optimal overnight and most of the day  Hyperglycemic episodes: Post prandial  Hypoglycemic episodes occurred: Rare  Overnight periods: Optimal    DIABETIC COMPLICATIONS: Microvascular complications:    Denies: CKD, retinopathy , neuropathy  Last eye exam: Completed 09/2022   Macrovascular complications:    Denies: CAD, PVD, CVA   HISTORY:  Past Medical History:  Past Medical History:  Diagnosis Date   Anemia    Anxiety    Arthritis    KNEE RIGHT   Blood transfusion without reported diagnosis    Depression    Diabetes mellitus    History of kidney stones    Hyperlipidemia    Hypertension    PT.DENIES,MEDS FOR KIDNEY AND PALPATIONS   PCOS (polycystic ovarian syndrome)    Uterine fibroid    Vitamin B12 deficiency    Past Surgical History:  Past Surgical History:  Procedure Laterality Date   CESAREAN SECTION     CHOLECYSTECTOMY     g1 p1  LAPAROSCOPIC GELPORT ASSISTED MYOMECTOMY  02/12/2015   Dr Alonna James   TOTAL KNEE ARTHROPLASTY Right 03/05/2022   Procedure: RIGHT TOTAL KNEE ARTHROPLASTY;  Surgeon: Timothy Ford, MD;  Location[facility] OR;  Service: Orthopedics;  Laterality: Right;   Social History:  reports that she has never smoked. She has never been exposed to tobacco smoke. She has never used smokeless tobacco. She reports that she does not drink alcohol and does not use drugs. Family History:  Family History  Problem Relation Age of Onset   Crohn's disease Mother    Diabetes Mother    Hypertension Mother    Hyperlipidemia Mother    Kidney disease Mother    Thyroid  disease Mother    Anxiety disorder Mother    Diabetes Father    Hypertension Father     Anxiety disorder Father    Liver disease Father    Alcoholism Father    Drug abuse Father    Depression Father    Breast cancer Maternal Grandmother 50   Diabetes Brother    Migraines Other    Colon cancer Neg Hx    Colon polyps Neg Hx    Esophageal cancer Neg Hx    Rectal cancer Neg Hx    Stomach cancer Neg Hx    Ulcerative colitis Neg Hx      HOME MEDICATIONS: Allergies as of 05/21/2023       Reactions   Sulfa Antibiotics Anaphylaxis, Rash   Sulfonamide Derivatives Anaphylaxis, Photosensitivity, Rash, Other (See Comments)   Severe headache        Medication List        Accurate as of May 21, 2023 10:28 AM. If you have any questions, ask your nurse or doctor.          acetaminophen  325 MG tablet Commonly known as: TYLENOL  Take 2 tablets (650 mg total) by mouth every 6 (six) hours as needed for mild pain (pain score 1-3), headache or moderate pain (pain score 4-6).   ALPRAZolam  0.25 MG tablet Commonly known as: XANAX  Take 1 tablet (0.25 mg total) by mouth 3 (three) times daily as needed.   Blood Glucose Monitor System w/Device Kit use as drected to check blood sugar   Dexcom G7 Sensor Misc Use as directed to continuously monitor blood glucose, changing the sensor every 10 days.   DULoxetine  60 MG capsule Commonly known as: CYMBALTA  Take 1 capsule (60 mg total) by mouth daily.   estradiol  0.075 MG/24HR Commonly known as: VIVELLE -DOT Place 1 patch onto the skin 2 (two) times a week.   FeroSul 325 (65 Fe) MG tablet Generic drug: ferrous sulfate  Take 1 tablet (325 mg total) by mouth 2 (two) times daily with a meal.   FREESTYLE LITE test strip Generic drug: glucose blood Use as instructed to check blood sugar 2 times daily   loratadine  10 MG tablet Commonly known as: CLARITIN  Take 1 tablet (10 mg total) by mouth daily.   metoprolol  succinate 50 MG 24 hr tablet Commonly known as: TOPROL -XL Take 1 tablet (50 mg total) by mouth daily. Take with or  immediately following a meal. What changed:  when to take this reasons to take this additional instructions   progesterone  100 MG capsule Commonly known as: PROMETRIUM  Take 1 capsule (100 mg total) by mouth daily. What changed: when to take this   rosuvastatin  40 MG tablet Commonly known as: CRESTOR  Take 1 tablet (40 mg total) by mouth daily. Pt needs office visit for further refills  Trulicity  3 MG/0.5ML Soaj Generic drug: Dulaglutide  Inject 3 mg as directed once a week. What changed: when to take this   Vitamin D  (Ergocalciferol ) 1.25 MG (50000 UNIT) Caps capsule Commonly known as: DRISDOL  Take 1 capsule (50,000 Units total) by mouth every 7 (seven) days. What changed: when to take this   Womens Multivitamin Gummies Chew Chew 2 each by mouth daily.         OBJECTIVE:   Vital Signs: BP 100/74 (BP Location: Right Arm, Patient Position: Sitting, Cuff Size: Normal)   Pulse 99   Ht 5\' 6"  (1.676 m)   Wt 166 lb 12.8 oz (75.7 kg)   SpO2 98%   BMI 26.92 kg/m   Wt Readings from Last 3 Encounters:  05/21/23 166 lb 12.8 oz (75.7 kg)  04/09/23 178 lb (80.7 kg)  03/14/23 171 lb (77.6 kg)     Exam: General: Pt appears well and is in NAD  Lungs: Clear with good BS bilat   Heart: RRR  Abdomen:  soft, nontender  Extremities: No pretibial edema.   Neuro: MS is good with appropriate affect, pt is alert and Ox3    DM foot exam: 09/11/2022   The skin of the feet is without sores or ulcerations The pedal pulses are 2+ on right and 2+ on left. The sensation is intact to a screening 5.07, 10 gram monofilament bilaterally    DATA REVIEWED:  Lab Results  Component Value Date   HGBA1C 6.3 (A) 05/21/2023   HGBA1C 5.5 01/08/2023   HGBA1C 5.7 (A) 09/11/2022     Latest Reference Range & Units 04/12/23 08:09  Sodium 135 - 145 mmol/L 139  Potassium 3.5 - 5.1 mmol/L 3.7  Chloride 98 - 111 mmol/L 108  CO2 22 - 32 mmol/L 20 (L)  Glucose 70 - 99 mg/dL 440 (H)  BUN 6 - 20  mg/dL 30 (H)  Creatinine 1.02 - 1.00 mg/dL 7.25 (H)  Calcium  8.9 - 10.3 mg/dL 9.4  Anion gap 5 - 15  11  Phosphorus 2.5 - 4.6 mg/dL 3.3  Albumin 3.5 - 5.0 g/dL 3.0 (L)  GFR, Estimated >60 mL/min 12 (L)    Old records , labs and images have been reviewed.     ASSESSMENT / PLAN / RECOMMENDATIONS:    1) Type 2 Diabetes Mellitus, Optimally controlled,  Without complications - Most recent A1c of 6.3 %. Goal A1c < 7.0 %.    -A1c remains at goal -Metformin  had to be discontinued during hospitalization for AKI 04/2023 -In reviewing her CGM data, patient has been noted with hyperglycemia -I have recommended adding glimepiride , in the past she was on glipizide  that we discontinued due to a low A1c - She continues to be on Trulicity   MEDICATIONS: - Start glimepiride  1 mg daily -Continue Trulicity  3 mg weekly    EDUCATION / INSTRUCTIONS: BG monitoring instructions: Patient is instructed to check her blood sugars 3 times a week Call [place] if: BG persistently < 70  I reviewed the Rule of 15 for the treatment of hypoglycemia in detail with the patient. Literature supplied.   2) Diabetic complications:  Eye: Does not have known diabetic retinopathy. Pt urged to have an updated eye exam  Neuro/ Feet: Does not have known diabetic peripheral neuropathy. Renal: Patient does not have known baseline CKD. She is on an ACEI/ARB at present.   3) Dyslipidemia :  - She has been compliant with rosuvastatin  intake   Medication   Rosuvastatin  40 mg  daily    F/U in 6 months    Signed electronically by: Natale Bail, MD  [facility] Endocrinology  [facility] [address] [place] [place], [place] [address] Phone: [phone] FAX: [phone]   CC: Shawnell Belton [address] [place] [address] Phone: [phone]  Fax: [phone]  Return to Endocrinology clinic as below: Future Appointments  Date  Time Provider Department Center  05/25/2023  4:00 PM Jalene Mayor Verena Glaser, DO [id number]  06/16/2023 12:00 PM Chrzanowski, Jami B, NP [facility] None

## 2023-05-21 NOTE — Patient Instructions (Addendum)
-   Start Glimepiride  1 mg, 1 tablet before Breakfast  -Continue  Trulicity  3 mg weekly     HOW TO TREAT LOW BLOOD SUGARS (Blood sugar LESS THAN 70 MG/DL) Please follow the RULE OF 15 for the treatment of hypoglycemia treatment (when your (blood sugars are less than 70 mg/dL)   STEP 1: Take 15 grams of carbohydrates when your blood sugar is low, which includes:  3-4 GLUCOSE TABS  OR 3-4 OZ OF JUICE OR REGULAR SODA OR ONE TUBE OF GLUCOSE GEL    STEP 2: RECHECK blood sugar in 15 MINUTES STEP 3: If your blood sugar is still low at the 15 minute recheck --> then, go back to STEP 1 and treat AGAIN with another 15 grams of carbohydrates.

## 2023-05-22 ENCOUNTER — Other Ambulatory Visit: Payer: Self-pay | Admitting: Family Medicine

## 2023-05-22 ENCOUNTER — Encounter: Payer: Self-pay | Admitting: Internal Medicine

## 2023-05-22 ENCOUNTER — Other Ambulatory Visit (HOSPITAL_COMMUNITY): Payer: Self-pay

## 2023-05-22 DIAGNOSIS — E1142 Type 2 diabetes mellitus with diabetic polyneuropathy: Secondary | ICD-10-CM

## 2023-05-22 DIAGNOSIS — I10 Essential (primary) hypertension: Secondary | ICD-10-CM

## 2023-05-22 DIAGNOSIS — R002 Palpitations: Secondary | ICD-10-CM

## 2023-05-22 DIAGNOSIS — F339 Major depressive disorder, recurrent, unspecified: Secondary | ICD-10-CM

## 2023-05-22 MED ORDER — METOPROLOL SUCCINATE ER 50 MG PO TB24
50.0000 mg | ORAL_TABLET | Freq: Every day | ORAL | 0 refills | Status: DC
  Filled 2023-05-22: qty 30, 30d supply, fill #0

## 2023-05-22 MED ORDER — DULOXETINE HCL 60 MG PO CPEP
60.0000 mg | ORAL_CAPSULE | Freq: Every day | ORAL | 0 refills | Status: DC
  Filled 2023-05-22: qty 30, 30d supply, fill #0

## 2023-05-25 ENCOUNTER — Other Ambulatory Visit: Payer: Self-pay | Admitting: Family Medicine

## 2023-05-25 ENCOUNTER — Other Ambulatory Visit (HOSPITAL_COMMUNITY): Payer: Self-pay

## 2023-05-25 ENCOUNTER — Other Ambulatory Visit: Payer: Self-pay | Admitting: Radiology

## 2023-05-25 ENCOUNTER — Encounter: Payer: Self-pay | Admitting: Family Medicine

## 2023-05-25 ENCOUNTER — Telehealth: Admitting: Family Medicine

## 2023-05-25 DIAGNOSIS — N183 Chronic kidney disease, stage 3 unspecified: Secondary | ICD-10-CM | POA: Diagnosis not present

## 2023-05-25 DIAGNOSIS — Z7984 Long term (current) use of oral hypoglycemic drugs: Secondary | ICD-10-CM | POA: Diagnosis not present

## 2023-05-25 DIAGNOSIS — E1165 Type 2 diabetes mellitus with hyperglycemia: Secondary | ICD-10-CM | POA: Diagnosis not present

## 2023-05-25 DIAGNOSIS — F411 Generalized anxiety disorder: Secondary | ICD-10-CM

## 2023-05-25 DIAGNOSIS — E785 Hyperlipidemia, unspecified: Secondary | ICD-10-CM

## 2023-05-25 DIAGNOSIS — Z79890 Hormone replacement therapy (postmenopausal): Secondary | ICD-10-CM

## 2023-05-25 NOTE — Progress Notes (Addendum)
 Established Patient Office Visit  Subjective   Patient ID: Cheryl Hart, female    DOB: 1975-01-29  Age: 48 y.o. MRN: [id number]  No chief complaint on file. I connected with  Elta Halter on 06/01/23 by a video enabled telemedicine application and verified that I am speaking with the correct person using two identifiers.   I discussed the limitations of evaluation and management by telemedicine. The patient expressed understanding and agreed to proceed.  Pt was home---  physician in office   HPI Discussed the use of AI scribe software for clinical note transcription with the patient, who gave verbal consent to proceed.  History of Present Illness Cheryl Hart is a 48 year old female with diabetes who presents for follow-up after recent hospitalization and lab work review.  She was hospitalized from March 27 to April 12, 2023, following a two-week period of illness that began in mid-March. During this time, she was unable to work from March 20 to April 14, 2023. Initially attributing her symptoms to a viral infection, she delayed seeking medical attention until her condition worsened.  She has a history of diabetes, with her A1c increasing from 5.5 to 6.3. She was transitioned from metformin  to glimepiride , which she takes once daily. She notes the necessity of eating with this medication to prevent hypoglycemia.  She is on nightly cholesterol medication, though her cholesterol levels have not been checked in two years. She is due for a follow-up to assess her cholesterol levels.  She was evaluated by a nephrologist at [facility] on May 12, 2023, and has a follow-up appointment scheduled for Jun 11, 2023. During her recent hospitalization and nephrology visit, she underwent several tests to monitor her kidney function.  She is currently out of school due to her recent illness and hospitalization. Her school required documentation of her illness, initially rejecting her hospital  discharge records and requesting a physician-signed letter.   Patient Active Problem List   Diagnosis Date Noted   Stage 3 chronic kidney disease (HCC) 05/25/2023   AKI (acute kidney injury) (HCC) 04/10/2023   Long-term (current) use of injectable non-insulin  antidiabetic drugs 09/11/2022   Long term current use of oral hypoglycemic drug 09/11/2022   Unilateral primary osteoarthritis, right knee 03/05/2022   Total knee replacement status, right 03/05/2022   Palpitations 11/11/2021   Essential hypertension 11/11/2021   Depression 09/21/2020   PCOS (polycystic ovarian syndrome) 09/21/2020   Vitamin B12 deficiency 09/21/2020   Type 2 diabetes mellitus without complication, without long-term current use of insulin  (HCC) 08/16/2020   Preventative health care 08/14/2020   Type 2 diabetes mellitus with hyperglycemia, without long-term current use of insulin  (HCC) 04/13/2020   Pain in toes of both feet 04/13/2020   Generalized anxiety disorder 04/18/2019   Diabetic polyneuropathy associated with type 2 diabetes mellitus (HCC) 04/18/2019   Anemia    Dyslipidemia 12/01/2018   Tachycardia 12/01/2018   HTN (hypertension) 10/01/2018   Fibroids    Fibroid, uterine    Obesity (BMI 30-39.9) 02/21/2013   Hyperlipidemia 03/13/2011   MORBID OBESITY 08/08/2008   Uncontrolled type 2 diabetes mellitus with complication, without long-term current use of insulin  03/31/2008   UNSPECIFIED ANEMIA 03/31/2008   GLYCOSURIA 03/29/2008   LIVER FUNCTION TESTS, ABNORMAL, HX OF 02/28/2007   ABDOMINAL PAIN 02/17/2007   ANXIETY 11/18/2006   GESTATIONAL DIABETES 11/18/2006   FATIGUE 11/18/2006   HEADACHE 11/18/2006   ABDOMINAL PAIN, EPIGASTRIC 11/18/2006   Past Medical History:  Diagnosis Date   Anemia  Anxiety    Arthritis    KNEE RIGHT   Blood transfusion without reported diagnosis    Depression    Diabetes mellitus    History of kidney stones    Hyperlipidemia    Hypertension    PT.DENIES,MEDS  FOR KIDNEY AND PALPATIONS   PCOS (polycystic ovarian syndrome)    Uterine fibroid    Vitamin B12 deficiency    Past Surgical History:  Procedure Laterality Date   CESAREAN SECTION     CHOLECYSTECTOMY     g1 p1     LAPAROSCOPIC GELPORT ASSISTED MYOMECTOMY  02/12/2015   Dr Alonna James   TOTAL KNEE ARTHROPLASTY Right 03/05/2022   Procedure: RIGHT TOTAL KNEE ARTHROPLASTY;  Surgeon: Timothy Ford, MD;  Location: [facility] OR;  Service: Orthopedics;  Laterality: Right;   Social History   Tobacco Use   Smoking status: Never    Passive exposure: Never   Smokeless tobacco: Never  Vaping Use   Vaping status: Never Used  Substance Use Topics   Alcohol use: No   Drug use: No   Social History   Socioeconomic History   Marital status: Single    Spouse name: Not on file   Number of children: Not on file   Years of education: Not on file   Highest education level: Not on file  Occupational History   Occupation: psych tech  Tobacco Use   Smoking status: Never    Passive exposure: Never   Smokeless tobacco: Never  Vaping Use   Vaping status: Never Used  Substance and Sexual Activity   Alcohol use: No   Drug use: No   Sexual activity: Not Currently    Partners: Male    Birth control/protection: Post-menopausal    Comment: menarche 48yo, sexual debut 48yo  Other Topics Concern   Not on file  Social History Narrative   Exercise-- no   Social Drivers of Corporate investment banker Strain: Not on file  Food Insecurity: No Food Insecurity (04/10/2023)   Hunger Vital Sign    Worried About Running Out of Food in the Last Year: Never true    Ran Out of Food in the Last Year: Never true  Transportation Needs: No Transportation Needs (04/10/2023)   PRAPARE - Administrator, Civil Service (Medical): No    Lack of Transportation (Non-Medical): No  Physical Activity: Not on file  Stress: Not on file  Social Connections: Unknown (05/28/2021)   Received from [facility]    Social Network    Social Network: Not on file  Intimate Partner Violence: Not At Risk (04/10/2023)   Humiliation, Afraid, Rape, and Kick questionnaire    Fear of Current or Ex-Partner: No    Emotionally Abused: No    Physically Abused: No    Sexually Abused: No   Family Status  Relation Name Status   Mother  Deceased   Father  Deceased   MGM  (Not Specified)   Brother  (Not Specified)   Other  (Not Specified)   Neg Hx  (Not Specified)  No partnership data on file   Family History  Problem Relation Age of Onset   Crohn's disease Mother    Diabetes Mother    Hypertension Mother    Hyperlipidemia Mother    Kidney disease Mother    Thyroid  disease Mother    Anxiety disorder Mother    Diabetes Father    Hypertension Father    Anxiety disorder Father  Liver disease Father    Alcoholism Father    Drug abuse Father    Depression Father    Breast cancer Maternal Grandmother 79   Diabetes Brother    Migraines Other    Colon cancer Neg Hx    Colon polyps Neg Hx    Esophageal cancer Neg Hx    Rectal cancer Neg Hx    Stomach cancer Neg Hx    Ulcerative colitis Neg Hx    Allergies  Allergen Reactions   Sulfa Antibiotics Anaphylaxis and Rash   Sulfonamide Derivatives Anaphylaxis, Photosensitivity, Rash and Other (See Comments)    Severe headache      Review of Systems  Constitutional:  Negative for fever and malaise/fatigue.  HENT:  Negative for congestion.   Eyes:  Negative for blurred vision.  Respiratory:  Negative for shortness of breath.   Cardiovascular:  Negative for chest pain, palpitations and leg swelling.  Gastrointestinal:  Negative for abdominal pain, blood in stool and nausea.  Genitourinary:  Negative for dysuria and frequency.  Musculoskeletal:  Negative for falls.  Skin:  Negative for rash.  Neurological:  Negative for dizziness, loss of consciousness and headaches.  Endo/Heme/Allergies:  Negative for environmental allergies.   Psychiatric/Behavioral:  Negative for depression. The patient is not nervous/anxious.       Objective:     There were no vitals taken for this visit. BP Readings from Last 3 Encounters:  05/21/23 100/74  04/12/23 (!) 134/93  03/14/23 111/83   Wt Readings from Last 3 Encounters:  05/21/23 166 lb 12.8 oz (75.7 kg)  04/09/23 178 lb (80.7 kg)  03/14/23 171 lb (77.6 kg)   SpO2 Readings from Last 3 Encounters:  05/21/23 98%  04/12/23 100%  03/14/23 97%      Physical Exam Vitals and nursing note reviewed.  Constitutional:      Appearance: Normal appearance.  Pulmonary:     Effort: Pulmonary effort is normal.  Neurological:     Mental Status: She is alert.  Psychiatric:        Mood and Affect: Mood normal.        Behavior: Behavior normal.        Thought Content: Thought content normal.        Judgment: Judgment normal.      No results found for any visits on 05/25/23.  Last CBC Lab Results  Component Value Date   WBC 6.2 04/11/2023   HGB 8.3 (L) 04/11/2023   HCT 25.3 (L) 04/11/2023   MCV 76.0 (L) 04/11/2023   MCH 24.9 (L) 04/11/2023   RDW 15.0 04/11/2023   PLT 327 04/11/2023   Last metabolic panel Lab Results  Component Value Date   GLUCOSE 185 (H) 04/12/2023   NA 139 04/12/2023   K 3.7 04/12/2023   CL 108 04/12/2023   CO2 20 (L) 04/12/2023   BUN 30 (H) 04/12/2023   CREATININE 4.21 (H) 04/12/2023   GFRNONAA 12 (L) 04/12/2023   CALCIUM  9.4 04/12/2023   PHOS 3.3 04/12/2023   PROT 6.7 04/11/2023   ALBUMIN 3.0 (L) 04/12/2023   LABGLOB 3.8 04/11/2023   AGRATIO 0.8 04/11/2023   BILITOT 0.7 04/11/2023   ALKPHOS 68 04/11/2023   AST 20 04/11/2023   ALT 11 04/11/2023   ANIONGAP 11 04/12/2023   Last lipids Lab Results  Component Value Date   CHOL 205 (H) 11/11/2021   HDL 45.00 11/11/2021   LDLCALC 141 (H) 11/11/2021   LDLDIRECT 92.0 02/01/2015   TRIG 92.0 11/11/2021  CHOLHDL 5 11/11/2021   Last hemoglobin A1c Lab Results  Component Value Date    HGBA1C 6.3 (A) 05/21/2023   Last thyroid  functions Lab Results  Component Value Date   TSH 1.24 11/11/2021   T3TOTAL 94 02/09/2019   T4TOTAL 8.4 11/11/2021   Last vitamin D  Lab Results  Component Value Date   VD25OH 27.47 (L) 11/11/2021   Last vitamin B12 and Folate Lab Results  Component Value Date   VITAMINB12 284 11/11/2021   FOLATE 6.9 02/09/2019      The 10-year ASCVD risk score (Arnett DK, et al., 2019) is: 4%    Assessment & Plan:   Problem List Items Addressed This Visit       Unprioritized   Stage 3 chronic kidney disease (HCC) - Primary   Type 2 diabetes mellitus with hyperglycemia, without long-term current use of insulin  (HCC)   hgba1c to be checked ,  minimize simple carbs. Increase exercise as tolerated. Continue current meds       Hyperlipidemia   Tolerating statin, encouraged heart healthy diet, avoid trans fats, minimize simple carbs and saturated fats. Increase exercise as tolerated      Assessment and Plan   Chemistry      Component Value Date/Time   NA 139 04/12/2023 0809   NA 138 02/09/2019 1208   K 3.7 04/12/2023 0809   CL 108 04/12/2023 0809   CO2 20 (L) 04/12/2023 0809   BUN 30 (H) 04/12/2023 0809   BUN 14 02/09/2019 1208   CREATININE 4.21 (H) 04/12/2023 0809   CREATININE 0.49 (L) 02/01/2015 1341      Component Value Date/Time   CALCIUM  9.4 04/12/2023 0809   ALKPHOS 68 04/11/2023 0546   AST 20 04/11/2023 0546   ALT 11 04/11/2023 0546   BILITOT 0.7 04/11/2023 0546   BILITOT <0.2 02/09/2019 1208       Assessment & Plan Type 2 diabetes mellitus   Her HbA1c has increased from 5.5 to 6.3, prompting a transition from metformin  to glimepiride . She reports hypoglycemic episodes if not eating with glimepiride . Continue glimepiride  once daily with meals to prevent hypoglycemia. Monitor blood glucose levels regularly.  Hyperlipidemia   Her last cholesterol check was approximately two years ago, though she remains compliant with  her cholesterol medication. Schedule a cholesterol check in August during her office visit. Continue the current cholesterol medication regimen.  Hospitalization for illness   She was hospitalized from March 27 to March 30 for an illness, with symptoms from March 20 to April 1. Provide a physician-signed letter on letterhead for school, documenting illness from March 20 to April 1 and hospitalization from March 27 to March 30.    Return in about 3 months (around 08/25/2023), or if symptoms worsen or fail to improve.    Camy Leder R Lowne Chase, DO

## 2023-05-26 ENCOUNTER — Other Ambulatory Visit (HOSPITAL_COMMUNITY): Payer: Self-pay

## 2023-05-26 ENCOUNTER — Other Ambulatory Visit: Payer: Self-pay

## 2023-05-26 MED ORDER — PROGESTERONE MICRONIZED 100 MG PO CAPS
100.0000 mg | ORAL_CAPSULE | Freq: Every day | ORAL | 0 refills | Status: DC
  Filled 2023-05-26: qty 90, 90d supply, fill #0

## 2023-05-26 MED ORDER — ROSUVASTATIN CALCIUM 40 MG PO TABS
40.0000 mg | ORAL_TABLET | Freq: Every day | ORAL | 0 refills | Status: DC
Start: 2023-05-26 — End: 2023-06-22
  Filled 2023-05-26: qty 14, 14d supply, fill #0

## 2023-05-26 NOTE — Telephone Encounter (Signed)
 Requesting: Xanax  Contract: 2021 UDS: 2021 Last OV: 05/25/2023 Next OV: n/a Last Refill: 03/27/2023, #30--0 rf Database:   Please advise

## 2023-05-26 NOTE — Telephone Encounter (Signed)
 Med refill request: progesterone  100 mg Last OV: 05/02/22 HRT consult Last AEX: 11/26/21 Next AEX: 06/16/23 Last MMG (if hormonal med) 08/05/22 BI-RADS 1 negative Refill authorized: progesterone  100 mg Please approve or deny as appropriate.

## 2023-05-27 ENCOUNTER — Other Ambulatory Visit (HOSPITAL_COMMUNITY): Payer: Self-pay

## 2023-05-27 ENCOUNTER — Encounter (HOSPITAL_COMMUNITY): Payer: Self-pay

## 2023-05-27 ENCOUNTER — Encounter: Payer: Self-pay | Admitting: Family Medicine

## 2023-05-27 DIAGNOSIS — F411 Generalized anxiety disorder: Secondary | ICD-10-CM

## 2023-05-27 NOTE — Telephone Encounter (Signed)
 Requesting: xanax  Contract:  2021 UDS: 2021 Last OV: 05/25/2023 Next OV: 08/17/2023 Last Refill: 03/27/2023, #30--1rf Database:   Please advise

## 2023-05-28 ENCOUNTER — Other Ambulatory Visit: Payer: Self-pay | Admitting: Family Medicine

## 2023-05-28 ENCOUNTER — Other Ambulatory Visit: Payer: Self-pay

## 2023-05-28 ENCOUNTER — Other Ambulatory Visit (HOSPITAL_COMMUNITY): Payer: Self-pay

## 2023-05-28 DIAGNOSIS — F411 Generalized anxiety disorder: Secondary | ICD-10-CM

## 2023-05-28 MED ORDER — FREESTYLE LIBRE 3 PLUS SENSOR MISC
1.0000 | 3 refills | Status: AC
  Filled 2023-05-28 (×2): qty 6, 90d supply, fill #0

## 2023-05-28 MED ORDER — ALPRAZOLAM 0.25 MG PO TABS
0.2500 mg | ORAL_TABLET | Freq: Three times a day (TID) | ORAL | 1 refills | Status: DC | PRN
  Filled 2023-05-28: qty 30, 10d supply, fill #0
  Filled 2023-06-22: qty 30, 10d supply, fill #1

## 2023-05-28 NOTE — Telephone Encounter (Signed)
 Requesting:alprazolam  0.25mg   Contract: 04/18/19 UDS: 04/18/19 Last Visit: 05/25/23 Next Visit: 08/17/23 Last Refill: 03/27/23 #30 and 0RF   Please Advise

## 2023-05-29 ENCOUNTER — Other Ambulatory Visit (HOSPITAL_COMMUNITY): Payer: Self-pay

## 2023-06-01 NOTE — Assessment & Plan Note (Signed)
 hgba1c to be checked, minimize simple carbs. Increase exercise as tolerated. Continue current meds

## 2023-06-01 NOTE — Assessment & Plan Note (Signed)
 Tolerating statin, encouraged heart healthy diet, avoid trans fats, minimize simple carbs and saturated fats. Increase exercise as tolerated

## 2023-06-01 NOTE — Patient Instructions (Signed)

## 2023-06-12 ENCOUNTER — Other Ambulatory Visit (HOSPITAL_COMMUNITY): Payer: Self-pay

## 2023-06-12 ENCOUNTER — Other Ambulatory Visit: Payer: Self-pay

## 2023-06-12 DIAGNOSIS — H5213 Myopia, bilateral: Secondary | ICD-10-CM | POA: Diagnosis not present

## 2023-06-12 DIAGNOSIS — E119 Type 2 diabetes mellitus without complications: Secondary | ICD-10-CM | POA: Diagnosis not present

## 2023-06-16 ENCOUNTER — Ambulatory Visit: Admitting: Radiology

## 2023-06-17 DIAGNOSIS — N189 Chronic kidney disease, unspecified: Secondary | ICD-10-CM | POA: Diagnosis not present

## 2023-06-17 DIAGNOSIS — I129 Hypertensive chronic kidney disease with stage 1 through stage 4 chronic kidney disease, or unspecified chronic kidney disease: Secondary | ICD-10-CM | POA: Diagnosis not present

## 2023-06-17 DIAGNOSIS — N179 Acute kidney failure, unspecified: Secondary | ICD-10-CM | POA: Diagnosis not present

## 2023-06-17 DIAGNOSIS — E1122 Type 2 diabetes mellitus with diabetic chronic kidney disease: Secondary | ICD-10-CM | POA: Diagnosis not present

## 2023-06-22 ENCOUNTER — Other Ambulatory Visit: Payer: Self-pay | Admitting: Family Medicine

## 2023-06-22 ENCOUNTER — Other Ambulatory Visit (HOSPITAL_COMMUNITY): Payer: Self-pay

## 2023-06-22 ENCOUNTER — Other Ambulatory Visit: Payer: Self-pay

## 2023-06-22 DIAGNOSIS — E785 Hyperlipidemia, unspecified: Secondary | ICD-10-CM

## 2023-06-22 DIAGNOSIS — I10 Essential (primary) hypertension: Secondary | ICD-10-CM

## 2023-06-22 DIAGNOSIS — E1142 Type 2 diabetes mellitus with diabetic polyneuropathy: Secondary | ICD-10-CM

## 2023-06-22 DIAGNOSIS — F339 Major depressive disorder, recurrent, unspecified: Secondary | ICD-10-CM

## 2023-06-22 DIAGNOSIS — R002 Palpitations: Secondary | ICD-10-CM

## 2023-06-22 MED ORDER — SODIUM BICARBONATE 650 MG PO TABS
650.0000 mg | ORAL_TABLET | Freq: Two times a day (BID) | ORAL | 0 refills | Status: DC
  Filled 2023-06-22: qty 60, 30d supply, fill #0

## 2023-06-22 MED ORDER — LOSARTAN POTASSIUM 25 MG PO TABS
25.0000 mg | ORAL_TABLET | Freq: Every day | ORAL | 6 refills | Status: AC
  Filled 2023-06-22: qty 30, 30d supply, fill #0
  Filled 2023-10-27: qty 30, 30d supply, fill #1
  Filled 2023-12-03: qty 30, 30d supply, fill #2
  Filled 2024-01-12 – 2024-01-26 (×2): qty 30, 30d supply, fill #3

## 2023-06-22 MED ORDER — METOPROLOL SUCCINATE ER 50 MG PO TB24
50.0000 mg | ORAL_TABLET | Freq: Every day | ORAL | 0 refills | Status: AC
  Filled 2023-06-22: qty 90, 90d supply, fill #0

## 2023-06-22 MED ORDER — DULOXETINE HCL 60 MG PO CPEP
60.0000 mg | ORAL_CAPSULE | Freq: Every day | ORAL | 0 refills | Status: DC
  Filled 2023-06-22: qty 90, 90d supply, fill #0

## 2023-06-22 MED ORDER — ROSUVASTATIN CALCIUM 40 MG PO TABS
40.0000 mg | ORAL_TABLET | Freq: Every day | ORAL | 0 refills | Status: DC
  Filled 2023-06-22: qty 90, 90d supply, fill #0

## 2023-06-29 ENCOUNTER — Other Ambulatory Visit (HOSPITAL_COMMUNITY): Payer: Self-pay

## 2023-07-06 ENCOUNTER — Other Ambulatory Visit: Payer: Self-pay | Admitting: Radiology

## 2023-07-06 ENCOUNTER — Ambulatory Visit: Payer: 59 | Admitting: Internal Medicine

## 2023-07-06 ENCOUNTER — Other Ambulatory Visit: Payer: Self-pay

## 2023-07-06 DIAGNOSIS — Z79890 Hormone replacement therapy (postmenopausal): Secondary | ICD-10-CM

## 2023-07-06 NOTE — Telephone Encounter (Signed)
 Med refill request:estradiol  Last AEX: 11/26/21 JC Next AEX: 07/08/23 JC Last MMG (if hormonal med) 08/05/22 Refill authorized: Last Rx sent #24 with zero refills on 04/30/23 JC. Please approve or deny

## 2023-07-07 ENCOUNTER — Other Ambulatory Visit (HOSPITAL_COMMUNITY): Payer: Self-pay

## 2023-07-07 MED ORDER — ESTRADIOL 0.075 MG/24HR TD PTTW
1.0000 | MEDICATED_PATCH | TRANSDERMAL | 0 refills | Status: DC
  Filled 2023-07-07: qty 24, 84d supply, fill #0

## 2023-07-08 ENCOUNTER — Encounter: Payer: Self-pay | Admitting: Radiology

## 2023-07-08 ENCOUNTER — Other Ambulatory Visit: Payer: Self-pay | Admitting: Family Medicine

## 2023-07-08 ENCOUNTER — Other Ambulatory Visit (HOSPITAL_COMMUNITY): Payer: Self-pay

## 2023-07-08 ENCOUNTER — Ambulatory Visit: Admitting: Radiology

## 2023-07-08 VITALS — BP 118/76 | Wt 167.8 lb

## 2023-07-08 DIAGNOSIS — Z113 Encounter for screening for infections with a predominantly sexual mode of transmission: Secondary | ICD-10-CM | POA: Diagnosis not present

## 2023-07-08 DIAGNOSIS — N3289 Other specified disorders of bladder: Secondary | ICD-10-CM | POA: Diagnosis not present

## 2023-07-08 DIAGNOSIS — N941 Unspecified dyspareunia: Secondary | ICD-10-CM

## 2023-07-08 DIAGNOSIS — N952 Postmenopausal atrophic vaginitis: Secondary | ICD-10-CM

## 2023-07-08 DIAGNOSIS — N898 Other specified noninflammatory disorders of vagina: Secondary | ICD-10-CM

## 2023-07-08 DIAGNOSIS — F411 Generalized anxiety disorder: Secondary | ICD-10-CM

## 2023-07-08 LAB — WET PREP FOR TRICH, YEAST, CLUE

## 2023-07-08 LAB — URINALYSIS, COMPLETE W/RFL CULTURE
Bilirubin Urine: NEGATIVE
Leukocyte Esterase: NEGATIVE
Nitrites, Initial: NEGATIVE

## 2023-07-08 MED ORDER — ESTRADIOL 0.1 MG/GM VA CREA
1.0000 g | TOPICAL_CREAM | VAGINAL | 3 refills | Status: DC
  Filled 2023-07-08: qty 42.5, 90d supply, fill #0

## 2023-07-08 NOTE — Progress Notes (Signed)
      Subjective: Cheryl Hart is a 48 y.o. female who complains of vaginal bleeding and pain with intercourse. Also with lower pelvic pressure/bladder spasms  Vaginal bleeding and pelvic pressure began 2 weeks ago after having intercourse (1st intercourse in 2 years). Bleeding has resolved, pressure persists. Denies frequency, urgency or hematuria. On HRT has had no bleeding before she had intercourse.    Review of Systems  All other systems reviewed and are negative.   Past Medical History:  Diagnosis Date   Anemia    Anxiety    Arthritis    KNEE RIGHT   Blood transfusion without reported diagnosis    Depression    Diabetes mellitus    History of kidney stones    Hyperlipidemia    Hypertension    PT.DENIES,MEDS FOR KIDNEY AND PALPATIONS   PCOS (polycystic ovarian syndrome)    Uterine fibroid    Vitamin B12 deficiency       Objective:  Today's Vitals   07/08/23 1458  BP: 118/76  Weight: 167 lb 12.8 oz (76.1 kg)   Body mass index is 27.08 kg/m.   Physical Exam Vitals and nursing note reviewed. Exam conducted with a chaperone present.  Constitutional:      Appearance: Normal appearance. She is well-developed.  Pulmonary:     Effort: Pulmonary effort is normal.  Abdominal:     General: Abdomen is flat.     Palpations: Abdomen is soft.  Genitourinary:    General: Normal vulva.     Vagina: Vaginal discharge and erythema present. No bleeding or lesions.     Cervix: Normal. No discharge, friability, lesion or erythema.     Uterus: Normal.      Adnexa: Right adnexa normal and left adnexa normal.   Neurological:     Mental Status: She is alert.   Psychiatric:        Mood and Affect: Mood normal.        Thought Content: Thought content normal.        Judgment: Judgment normal.    Urine dipstick shows positive for RBC's and positive for protein.  Micro exam: 0-5 WBC's per HPF, 3-10 RBC's per HPF, and few+ bacteria.  Microscopic wet-mount exam shows negative  for pathogens, normal epithelial cells.   Darice Hoit, CMA present for exam  Assessment:/Plan:   1. Vaginal discharge (Primary) negative - WET PREP FOR TRICH, YEAST, CLUE  2. Screening examination for venereal disease - SURESWAB CT/NG/T. vaginalis  3. Dyspareunia in female Reassured estrace  should help with discomfort and prevent bleeding.  4. Vaginal atrophy - estradiol  (ESTRACE  VAGINAL) 0.1 MG/GM vaginal cream; Place 1 g vaginally 3 (three) times a week.  Dispense: 42.5 g; Refill: 3  5. Bladder spasms - Urinalysis,Complete w/RFL Culture    Jhoana Upham B, NP 4:37 PM

## 2023-07-09 LAB — SURESWAB CT/NG/T. VAGINALIS
C. trachomatis RNA, TMA: NOT DETECTED
N. gonorrhoeae RNA, TMA: NOT DETECTED
Trichomonas vaginalis RNA: NOT DETECTED

## 2023-07-09 MED ORDER — ALPRAZOLAM 0.25 MG PO TABS
0.2500 mg | ORAL_TABLET | Freq: Three times a day (TID) | ORAL | 1 refills | Status: DC | PRN
  Filled 2023-07-09: qty 30, 10d supply, fill #0
  Filled 2023-08-06: qty 30, 10d supply, fill #1

## 2023-07-09 NOTE — Telephone Encounter (Signed)
 Requesting: Xanax  Contract: n/a UDS: n/a Last OV: 05/25/23---virtual Next OV: 08/17/23  Last Refill: 05/28/23, #30--0 Rf Database:   Please advise

## 2023-07-10 ENCOUNTER — Ambulatory Visit: Payer: Self-pay | Admitting: Radiology

## 2023-07-10 ENCOUNTER — Other Ambulatory Visit (HOSPITAL_COMMUNITY): Payer: Self-pay

## 2023-07-10 LAB — URINALYSIS, COMPLETE W/RFL CULTURE
Glucose, UA: NEGATIVE
Hyaline Cast: NONE SEEN /LPF
Ketones, ur: NEGATIVE
Specific Gravity, Urine: 1.017 (ref 1.001–1.035)
pH: 6.5 (ref 5.0–8.0)

## 2023-07-10 LAB — URINE CULTURE
MICRO NUMBER:: 16623660
SPECIMEN QUALITY:: ADEQUATE

## 2023-07-10 LAB — CULTURE INDICATED

## 2023-07-13 DIAGNOSIS — N179 Acute kidney failure, unspecified: Secondary | ICD-10-CM | POA: Diagnosis not present

## 2023-07-22 ENCOUNTER — Ambulatory Visit: Admitting: Radiology

## 2023-07-28 ENCOUNTER — Other Ambulatory Visit (HOSPITAL_COMMUNITY): Payer: Self-pay

## 2023-07-29 ENCOUNTER — Other Ambulatory Visit: Payer: Self-pay

## 2023-07-31 ENCOUNTER — Encounter (HOSPITAL_COMMUNITY): Payer: Self-pay

## 2023-07-31 ENCOUNTER — Other Ambulatory Visit (HOSPITAL_COMMUNITY): Payer: Self-pay

## 2023-08-06 ENCOUNTER — Other Ambulatory Visit: Payer: Self-pay | Admitting: Family Medicine

## 2023-08-06 ENCOUNTER — Other Ambulatory Visit (HOSPITAL_COMMUNITY): Payer: Self-pay

## 2023-08-06 MED ORDER — SODIUM BICARBONATE 650 MG PO TABS
650.0000 mg | ORAL_TABLET | Freq: Every day | ORAL | 6 refills | Status: AC
  Filled 2023-08-06: qty 60, 60d supply, fill #0
  Filled 2023-09-20: qty 60, 60d supply, fill #1
  Filled 2023-12-16: qty 60, 60d supply, fill #2
  Filled 2024-02-19: qty 60, 60d supply, fill #3

## 2023-08-07 ENCOUNTER — Other Ambulatory Visit: Payer: Self-pay

## 2023-08-07 ENCOUNTER — Other Ambulatory Visit (HOSPITAL_COMMUNITY): Payer: Self-pay

## 2023-08-07 MED ORDER — VITAMIN D (ERGOCALCIFEROL) 1.25 MG (50000 UNIT) PO CAPS
50000.0000 [IU] | ORAL_CAPSULE | ORAL | 1 refills | Status: AC
  Filled 2023-08-07: qty 12, 84d supply, fill #0
  Filled 2023-11-17 – 2023-12-03 (×2): qty 12, 84d supply, fill #1

## 2023-08-17 ENCOUNTER — Ambulatory Visit: Admitting: Family Medicine

## 2023-08-21 ENCOUNTER — Encounter: Payer: Self-pay | Admitting: Internal Medicine

## 2023-08-21 ENCOUNTER — Ambulatory Visit: Admitting: Internal Medicine

## 2023-08-21 VITALS — BP 120/80 | HR 112 | Ht 66.0 in | Wt 188.2 lb

## 2023-08-21 DIAGNOSIS — E785 Hyperlipidemia, unspecified: Secondary | ICD-10-CM

## 2023-08-21 DIAGNOSIS — Z7985 Long-term (current) use of injectable non-insulin antidiabetic drugs: Secondary | ICD-10-CM | POA: Diagnosis not present

## 2023-08-21 DIAGNOSIS — E119 Type 2 diabetes mellitus without complications: Secondary | ICD-10-CM

## 2023-08-21 LAB — POCT GLYCOSYLATED HEMOGLOBIN (HGB A1C): Hemoglobin A1C: 5.3 % (ref 4.0–5.6)

## 2023-08-21 LAB — POCT GLUCOSE (DEVICE FOR HOME USE): Glucose Fasting, POC: 180 mg/dL — AB (ref 70–99)

## 2023-08-21 NOTE — Patient Instructions (Signed)
-   Stop Glimepiride  1 mg, 1 tablet before Breakfast  - Continue  Trulicity  3 mg weekly     HOW TO TREAT LOW BLOOD SUGARS (Blood sugar LESS THAN 70 MG/DL) Please follow the RULE OF 15 for the treatment of hypoglycemia treatment (when your (blood sugars are less than 70 mg/dL)   STEP 1: Take 15 grams of carbohydrates when your blood sugar is low, which includes:  3-4 GLUCOSE TABS  OR 3-4 OZ OF JUICE OR REGULAR SODA OR ONE TUBE OF GLUCOSE GEL    STEP 2: RECHECK blood sugar in 15 MINUTES STEP 3: If your blood sugar is still low at the 15 minute recheck --> then, go back to STEP 1 and treat AGAIN with another 15 grams of carbohydrates.

## 2023-08-21 NOTE — Progress Notes (Signed)
 Name: Cheryl Hart  Age/ Sex: 48 y.o., female   MRN/ DOB: [id number], [birth year]     PCP: Antonio Cyndee Jamee JONELLE, DO   Reason for Endocrinology Evaluation: Type 2 Diabetes Mellitus  Initial Endocrine Consultative Visit: 12/01/2018    PATIENT IDENTIFIER: [name] is a 48 y.o. female with a past medical history of HTN, T2DM, and dyslipidemia  . The patient has followed with Endocrinology clinic since 12/01/2018 for consultative assistance with management of her diabetes.  DIABETIC HISTORY:  [name] was diagnosed with DM in 2010. She has tried invokana  but didn't;t feel good on it , has been unable to afford Januvia  or victoza  in the past.  Her hemoglobin A1c has ranged from 7.4% in 2018, peaking at 11.1% in 2019.   On her initial visit to our clinic she had an A1c of 9.0%  , she was on Glimepiride  and metformin  . 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     Today (08/21/2023): [name] is here for a f/u on diabetes.She was using freestyle libre but developed pruritus  , she just received glucose meter but has used it yet .  Unknown hypoglycemic episode  Has noted vaginal irritation if she holds using too long  No nausea or vomiting  Denies constipation or diarrhea    HOME DIABETES REGIMEN:  Glimepiride  1 mg daily  Trulicity  3 mg weekly Rosuvastatin  40 mg daily     Statin: Yes ACE-I/ARB: yes   CONTINUOUS GLUCOSE MONITORING RECORD INTERPRETATION  : N/A   DIABETIC COMPLICATIONS: Microvascular complications:    Denies: CKD, retinopathy , neuropathy  Last eye exam: Completed 09/2022    Macrovascular complications:    Denies: CAD, PVD, CVA   HISTORY:  Past Medical History:  Past Medical History:  Diagnosis Date   Anemia    Anxiety    Arthritis    KNEE RIGHT   Blood transfusion without reported diagnosis    Depression    Diabetes mellitus    History of kidney stones    Hyperlipidemia    Hypertension    PT.DENIES,MEDS FOR KIDNEY AND PALPATIONS   PCOS (polycystic ovarian syndrome)    Uterine fibroid    Vitamin B12 deficiency    Past Surgical History:  Past Surgical History:  Procedure Laterality Date   CESAREAN SECTION     CHOLECYSTECTOMY     g1 p1     LAPAROSCOPIC GELPORT ASSISTED MYOMECTOMY  02/12/2015   Dr Sherie   TOTAL KNEE ARTHROPLASTY Right 03/05/2022   Procedure: RIGHT TOTAL KNEE ARTHROPLASTY;  Surgeon: Harden Jerona GAILS, MD;  Location: [facility] OR;  Service: Orthopedics;  Laterality: Right;   Social History:  reports that she has never smoked. She has never been exposed to tobacco smoke. She has never used smokeless tobacco. She reports that she does not drink alcohol and does not use drugs. Family History:  Family History  Problem Relation Age of Onset   Crohn's disease Mother    Diabetes Mother    Hypertension Mother    Hyperlipidemia Mother    Kidney disease Mother  Thyroid  disease Mother    Anxiety disorder Mother    Diabetes Father    Hypertension Father    Anxiety disorder Father    Liver disease Father    Alcoholism Father    Drug abuse Father    Depression Father    Breast cancer Maternal Grandmother 26   Diabetes Brother    Migraines Other    Colon cancer Neg Hx    Colon polyps Neg Hx    Esophageal cancer Neg Hx    Rectal cancer Neg Hx    Stomach cancer Neg Hx    Ulcerative colitis Neg Hx      HOME MEDICATIONS: Allergies as of 08/21/2023       Reactions   Sulfa Antibiotics Anaphylaxis, Rash   Sulfonamide Derivatives Anaphylaxis, Photosensitivity, Rash, Other (See Comments)   Severe headache        Medication List         Accurate as of August 21, 2023  7:41 AM. If you have any questions, ask your nurse or doctor.          acetaminophen  325 MG tablet Commonly known as: TYLENOL  Take 2 tablets (650 mg total) by mouth every 6 (six) hours as needed for mild pain (pain score 1-3), headache or moderate pain (pain score 4-6).   ALPRAZolam  0.25 MG tablet Commonly known as: XANAX  Take 1 tablet (0.25 mg total) by mouth 3 (three) times daily as needed.   Blood Glucose Monitor System w/Device Kit use as drected to check blood sugar   DULoxetine  60 MG capsule Commonly known as: CYMBALTA  Take 1 capsule (60 mg total) by mouth daily.   estradiol  0.075 MG/24HR Commonly known as: VIVELLE -DOT Place 1 patch onto the skin 2 (two) times a week.   estradiol  0.1 MG/GM vaginal cream Commonly known as: ESTRACE  VAGINAL Place 1 g vaginally 3 (three) times a week.   FeroSul 325 (65 Fe) MG tablet Generic drug: ferrous sulfate  Take 1 tablet (325 mg total) by mouth 2 (two) times daily with a meal.   FreeStyle Libre 3 Plus Sensor Misc Use to continuously monitor blood glucose, changing the sensor every 15 days.   FREESTYLE LITE test strip Generic drug: glucose blood Use as instructed to check blood sugar 2 times daily   glimepiride  1 MG tablet Commonly known as: AMARYL  Take 1 tablet (1 mg total) by mouth daily with breakfast.   loratadine  10 MG tablet Commonly known as: CLARITIN  Take 1 tablet (10 mg total) by mouth daily.   losartan  25 MG tablet Commonly known as: COZAAR  Take 1 tablet (25 mg total) by mouth daily.   metoprolol  succinate 50 MG 24 hr tablet Commonly known as: TOPROL -XL Take 1 tablet (50 mg total) by mouth daily. Take with or immediately following a meal.   progesterone  100 MG capsule Commonly known as: PROMETRIUM  Take 1 capsule (100 mg total) by mouth daily.   rosuvastatin  40 MG tablet Commonly known as: CRESTOR  Take 1 tablet (40 mg total) by mouth daily.   sodium bicarbonate  650  MG tablet Take 1 tablet by mouth daily   Trulicity  3 MG/0.5ML Soaj Generic drug: Dulaglutide  Inject 3 mg as directed once a week.   Vitamin D  (Ergocalciferol ) 1.25 MG (50000 UNIT) Caps capsule Commonly known as: DRISDOL  Take 1 capsule (50,000 Units total) by mouth every 7 (seven) days.   Womens Multivitamin Gummies Chew Chew 2 each by mouth daily.         OBJECTIVE:   Vital Signs: BP  120/80 (BP Location: Left Arm, Patient Position: Sitting, Cuff Size: Normal)   Pulse (!) 112   Ht 5' 6 (1.676 m)   Wt 188 lb 3.2 oz (85.4 kg)   SpO2 99%   BMI 30.38 kg/m   Wt Readings from Last 3 Encounters:  08/21/23 188 lb 3.2 oz (85.4 kg)  07/08/23 167 lb 12.8 oz (76.1 kg)  05/21/23 166 lb 12.8 oz (75.7 kg)     Exam: General: Pt appears well and is in NAD  Lungs: Clear with good BS bilat   Heart: RRR  Abdomen:  soft, nontender  Extremities: No pretibial edema.   Neuro: MS is good with appropriate affect, pt is alert and Ox3    DM foot exam: 08/21/2023   The skin of the feet is without sores or ulcerations The pedal pulses are 2+ on right and 2+ on left. The sensation is intact to a screening 5.07, 10 gram monofilament bilaterally    DATA REVIEWED:  Lab Results  Component Value Date   HGBA1C 5.3 08/21/2023   HGBA1C 6.3 (A) 05/21/2023   HGBA1C 5.5 01/08/2023     Latest Reference Range & Units 08/21/23 08:08  Sodium 135 - 146 mmol/L 139  Potassium 3.5 - 5.3 mmol/L 4.1  Chloride 98 - 110 mmol/L 105  CO2 20 - 32 mmol/L 27  Glucose 65 - 99 mg/dL 69  BUN 7 - 25 mg/dL 16  Creatinine 9.49 - 8.96 mg/dL 8.89 (H)  Calcium  8.6 - 10.4 mg/dL 9.9  BUN/Creatinine Ratio 6 - 22 (calc) 15  eGFR > OR = 60 mL/min/1.27m2 61     Latest Reference Range & Units 08/21/23 08:08  Total CHOL/HDL Ratio <5.0 (calc) 2.6  Cholesterol <200 mg/dL 825  HDL Cholesterol > OR = 50 mg/dL 68  LDL Cholesterol (Calc) mg/dL (calc) 89  Non-HDL Cholesterol (Calc) <130 mg/dL (calc) 893  Triglycerides  <150 mg/dL 80     Old records , labs and images have been reviewed.   ASSESSMENT / PLAN / RECOMMENDATIONS:    1) Type 2 Diabetes Mellitus, Optimally controlled,  Without complications - Most recent A1c of 5.3 %. Goal A1c < 7.0 %.    -A1c continues to trend down  -Metformin  had to be discontinued during hospitalization for AKI 04/2023, and I started her on glimepiride  to prevent hyperglycemia -Her A1c is low at 5.3%, and known hypoglycemic episode as she has not been checking glucose, unable to recall glucose readings while on freestyle libre, she did develop pruritus.  I have asked her to apply Flonase  to the skin prior to freestyle libre sensor application and see how she does with that, if not, the patient was encouraged to check glucose twice daily before breakfast and before supper.  As her fasting glucose is elevated at 180 Mg/DL - I did explain to the patient that her fasting glucose is too high for the level of A1c, and due to lack of glucose data I am going to assume that her BGs are lower and we will discontinue glimepiride  until further data is available - GFR has improved     MEDICATIONS: - Stop glimepiride  1 mg daily -Continue Trulicity  3 mg weekly    EDUCATION / INSTRUCTIONS: BG monitoring instructions: Patient is instructed to check her blood sugars 3 times a week Call [facility] if: BG persistently < 70  I reviewed the Rule of 15 for the treatment of hypoglycemia in detail with the patient. Literature supplied.   2) Diabetic  complications:  Eye: Does not have known diabetic retinopathy. Pt urged to have an updated eye exam  Neuro/ Feet: Does not have known diabetic peripheral neuropathy. Renal: Patient does not have known baseline CKD. She is on an ACEI/ARB at present.   3) Dyslipidemia :  - She has been compliant with rosuvastatin  intake - Lipid panel at goal   Medication   Continue Rosuvastatin  40 mg daily    4) Pruritus :  - Patient  developed pruritus to freestyle libre sensor - I have asked her to rub Flonase  on the skin prior to applying the sensor and see if that would help with her symptoms, otherwise she was encouraged to use regular glucose meter for glucose checks     F/U in 6 months    Signed electronically by: Stefano Redgie Butts, MD  [facility] Endocrinology  [facility] [address] [place], [place] [address] Phone: [phone] FAX: [phone]   CC: Antonio Cyndee Jamee JONELLE, DO [address] [place] [address] Phone: [phone]  Fax: [phone]  Return to Endocrinology clinic as below: Future Appointments  Date Time Provider Department Center  08/25/2023  3:40 PM Antonio Cyndee Jamee JONELLE, DO [facility]  08/26/2023  1:30 PM Chrzanowski, Jami B, NP [facility] None

## 2023-08-22 LAB — BASIC METABOLIC PANEL WITH GFR
BUN/Creatinine Ratio: 15 (calc) (ref 6–22)
BUN: 16 mg/dL (ref 7–25)
CO2: 27 mmol/L (ref 20–32)
Calcium: 9.9 mg/dL (ref 8.6–10.4)
Chloride: 105 mmol/L (ref 98–110)
Creat: 1.1 mg/dL — ABNORMAL HIGH (ref 0.50–1.03)
Glucose, Bld: 69 mg/dL (ref 65–99)
Potassium: 4.1 mmol/L (ref 3.5–5.3)
Sodium: 139 mmol/L (ref 135–146)
eGFR: 61 mL/min/1.73m2 (ref >=60)

## 2023-08-22 LAB — LIPID PANEL
Cholesterol: 174 mg/dL (ref <200)
HDL: 68 mg/dL (ref >=50)
LDL Cholesterol (Calc): 89 mg/dL
Non-HDL Cholesterol (Calc): 106 mg/dL (ref <130)
Total CHOL/HDL Ratio: 2.6 (calc) (ref <5.0)
Triglycerides: 80 mg/dL (ref <150)

## 2023-08-24 ENCOUNTER — Ambulatory Visit: Payer: Self-pay | Admitting: Internal Medicine

## 2023-08-24 ENCOUNTER — Other Ambulatory Visit (HOSPITAL_COMMUNITY): Payer: Self-pay

## 2023-08-24 MED ORDER — ROSUVASTATIN CALCIUM 40 MG PO TABS
40.0000 mg | ORAL_TABLET | Freq: Every day | ORAL | 3 refills | Status: AC
  Filled 2023-08-24 – 2023-09-20 (×2): qty 90, 90d supply, fill #0
  Filled 2024-01-12 – 2024-01-26 (×2): qty 90, 90d supply, fill #1

## 2023-08-25 ENCOUNTER — Ambulatory Visit: Admitting: Family Medicine

## 2023-08-25 ENCOUNTER — Other Ambulatory Visit: Payer: Self-pay | Admitting: Family Medicine

## 2023-08-25 ENCOUNTER — Encounter: Payer: Self-pay | Admitting: Family Medicine

## 2023-08-25 VITALS — BP 108/80 | HR 89 | Temp 98.1°F | Resp 18 | Ht 66.0 in | Wt 188.2 lb

## 2023-08-25 DIAGNOSIS — N183 Chronic kidney disease, stage 3 unspecified: Secondary | ICD-10-CM

## 2023-08-25 DIAGNOSIS — Z1231 Encounter for screening mammogram for malignant neoplasm of breast: Secondary | ICD-10-CM

## 2023-08-25 DIAGNOSIS — E785 Hyperlipidemia, unspecified: Secondary | ICD-10-CM | POA: Diagnosis not present

## 2023-08-25 DIAGNOSIS — E1165 Type 2 diabetes mellitus with hyperglycemia: Secondary | ICD-10-CM

## 2023-08-25 DIAGNOSIS — Z79899 Other long term (current) drug therapy: Secondary | ICD-10-CM

## 2023-08-25 DIAGNOSIS — F411 Generalized anxiety disorder: Secondary | ICD-10-CM | POA: Diagnosis not present

## 2023-08-25 DIAGNOSIS — Z7985 Long-term (current) use of injectable non-insulin antidiabetic drugs: Secondary | ICD-10-CM

## 2023-08-25 DIAGNOSIS — I10 Essential (primary) hypertension: Secondary | ICD-10-CM

## 2023-08-25 NOTE — Addendum Note (Signed)
 Addended by: DORLENE CHIQUITA RAMAN on: 08/25/2023 05:52 PM   Modules accepted: Orders

## 2023-08-25 NOTE — Assessment & Plan Note (Signed)
 Lab Results  Component Value Date   CHOL 174 08/21/2023   HDL 68 08/21/2023   LDLCALC 89 08/21/2023   LDLDIRECT 92.0 02/01/2015   TRIG 80 08/21/2023   CHOLHDL 2.6 08/21/2023  CON'T CRESTOR 

## 2023-08-25 NOTE — Progress Notes (Signed)
 Subjective:    Patient ID: Cheryl Hart, female    DOB: 10/08/1975, 48 y.o.   MRN: [id number]  Chief Complaint  Patient presents with   Hypertension   Hyperlipidemia   Diabetes   Follow-up    HPI Patient is in today for F/U ,  LIPID, HTN AND ANXIETY/DEPRESSION.  Discussed the use of AI scribe software for clinical note transcription with the patient, who gave verbal consent to proceed.  History of Present Illness Cheryl Hart is a 48 year old female who presents for a routine follow-up visit.  Her blood sugar levels are well-controlled with a recent hemoglobin A1c of 5.3%. She was recently seen by another physician who discontinued her glimepiride . She is currently on Felicity, administered once a week.  Her cholesterol levels are reported as excellent. No swelling in her ankles.  No feelings of depression.   Past Medical History:  Diagnosis Date   Anemia    Anxiety    Arthritis    KNEE RIGHT   Blood transfusion without reported diagnosis    Depression    Diabetes mellitus    History of kidney stones    Hyperlipidemia    Hypertension    PT.DENIES,MEDS FOR KIDNEY AND PALPATIONS   PCOS (polycystic ovarian syndrome)    Uterine fibroid    Vitamin B12 deficiency     Past Surgical History:  Procedure Laterality Date   CESAREAN SECTION     CHOLECYSTECTOMY     g1 p1     LAPAROSCOPIC GELPORT ASSISTED MYOMECTOMY  02/12/2015   Dr Sherie   TOTAL KNEE ARTHROPLASTY Right 03/05/2022   Procedure: RIGHT TOTAL KNEE ARTHROPLASTY;  Surgeon: Harden Jerona GAILS, MD;  Location: [facility] OR;  Service: Orthopedics;  Laterality: Right;    Family History  Problem Relation Age of Onset   Crohn's disease Mother    Diabetes Mother    Hypertension Mother    Hyperlipidemia Mother    Kidney disease Mother    Thyroid  disease Mother    Anxiety disorder Mother    Diabetes Father    Hypertension Father    Anxiety disorder Father    Liver disease Father    Alcoholism Father    Drug abuse  Father    Depression Father    Breast cancer Maternal Grandmother 46   Diabetes Brother    Migraines Other    Colon cancer Neg Hx    Colon polyps Neg Hx    Esophageal cancer Neg Hx    Rectal cancer Neg Hx    Stomach cancer Neg Hx    Ulcerative colitis Neg Hx     Social History   Socioeconomic History   Marital status: Single    Spouse name: Not on file   Number of children: Not on file   Years of education: Not on file   Highest education level: Not on file  Occupational History   Occupation: psych tech  Tobacco Use   Smoking status: Never    Passive exposure: Never   Smokeless tobacco: Never  Vaping Use   Vaping status: Never Used  Substance and Sexual Activity   Alcohol use: No   Drug use: No   Sexual activity: Not Currently    Partners: Male    Birth control/protection: Post-menopausal    Comment: menarche 48yo, sexual debut 48yo  Other Topics Concern   Not on file  Social History Narrative   Exercise-- no   Social Drivers of Corporate investment banker Strain: Not on  file  Food Insecurity: No Food Insecurity (04/10/2023)   Hunger Vital Sign    Worried About Running Out of Food in the Last Year: Never true    Ran Out of Food in the Last Year: Never true  Transportation Needs: No Transportation Needs (04/10/2023)   PRAPARE - Administrator, Civil Service (Medical): No    Lack of Transportation (Non-Medical): No  Physical Activity: Not on file  Stress: Not on file  Social Connections: Unknown (05/28/2021)   Received from [facility]   Social Network    Social Network: Not on file  Intimate Partner Violence: Not At Risk (04/10/2023)   Humiliation, Afraid, Rape, and Kick questionnaire    Fear of Current or Ex-Partner: No    Emotionally Abused: No    Physically Abused: No    Sexually Abused: No    Outpatient Medications Prior to Visit  Medication Sig Dispense Refill   acetaminophen  (TYLENOL ) 325 MG tablet Take 2 tablets (650 mg total) by  mouth every 6 (six) hours as needed for mild pain (pain score 1-3), headache or moderate pain (pain score 4-6).     ALPRAZolam  (XANAX ) 0.25 MG tablet Take 1 tablet (0.25 mg total) by mouth 3 (three) times daily as needed. 30 tablet 1   Blood Glucose Monitoring Suppl (BLOOD GLUCOSE MONITOR SYSTEM) w/Device KIT use as drected to check blood sugar 1 kit 0   Dulaglutide  (TRULICITY ) 3 MG/0.5ML SOAJ Inject 3 mg as directed once a week. 6 mL 3   DULoxetine  (CYMBALTA ) 60 MG capsule Take 1 capsule (60 mg total) by mouth daily. 90 capsule 0   estradiol  (ESTRACE  VAGINAL) 0.1 MG/GM vaginal cream Place 1 g vaginally 3 (three) times a week. 42.5 g 3   estradiol  (VIVELLE -DOT) 0.075 MG/24HR Place 1 patch onto the skin 2 (two) times a week. 24 patch 0   ferrous sulfate  325 (65 FE) MG tablet Take 1 tablet (325 mg total) by mouth 2 (two) times daily with a meal. 30 tablet 1   loratadine  (CLARITIN ) 10 MG tablet Take 1 tablet (10 mg total) by mouth daily. 30 tablet 0   losartan  (COZAAR ) 25 MG tablet Take 1 tablet (25 mg total) by mouth daily. 30 tablet 6   metoprolol  succinate (TOPROL -XL) 50 MG 24 hr tablet Take 1 tablet (50 mg total) by mouth daily. Take with or immediately following a meal. 90 tablet 0   Multiple Vitamins-Minerals (WOMENS MULTIVITAMIN GUMMIES) CHEW Chew 2 each by mouth daily.     progesterone  (PROMETRIUM ) 100 MG capsule Take 1 capsule (100 mg total) by mouth daily. 90 capsule 0   rosuvastatin  (CRESTOR ) 40 MG tablet Take 1 tablet (40 mg total) by mouth daily. 90 tablet 3   sodium bicarbonate  650 MG tablet Take 1 tablet by mouth daily 60 tablet 6   Vitamin D , Ergocalciferol , (DRISDOL ) 1.25 MG (50000 UNIT) CAPS capsule Take 1 capsule (50,000 Units total) by mouth every 7 (seven) days. 12 capsule 1   Continuous Glucose Sensor (FREESTYLE LIBRE 3 PLUS SENSOR) MISC Use to continuously monitor blood glucose, changing the sensor every 15 days. (Patient not taking: Reported on 08/25/2023) 6 each 3   glucose  blood (FREESTYLE LITE) test strip Use as instructed to check blood sugar 2 times daily (Patient not taking: Reported on 08/25/2023) 100 each 5   No facility-administered medications prior to visit.    Allergies  Allergen Reactions   Sulfa Antibiotics Anaphylaxis and Rash   Sulfonamide Derivatives Anaphylaxis, Photosensitivity, Rash  and Other (See Comments)    Severe headache    Review of Systems  Constitutional:  Negative for fever and malaise/fatigue.  HENT:  Negative for congestion.   Eyes:  Negative for blurred vision.  Respiratory:  Negative for shortness of breath.   Cardiovascular:  Negative for chest pain, palpitations and leg swelling.  Gastrointestinal:  Negative for abdominal pain, blood in stool and nausea.  Genitourinary:  Negative for dysuria and frequency.  Musculoskeletal:  Negative for falls.  Skin:  Negative for rash.  Neurological:  Negative for dizziness, loss of consciousness and headaches.  Endo/Heme/Allergies:  Negative for environmental allergies.  Psychiatric/Behavioral:  Negative for depression, hallucinations, memory loss, substance abuse and suicidal ideas. The patient is not nervous/anxious and does not have insomnia.        Objective:    Physical Exam Vitals and nursing note reviewed.  Constitutional:      General: She is not in acute distress.    Appearance: Normal appearance. She is well-developed.  HENT:     Head: Normocephalic and atraumatic.  Eyes:     General: No scleral icterus.       Right eye: No discharge.        Left eye: No discharge.  Cardiovascular:     Rate and Rhythm: Normal rate and regular rhythm.     Heart sounds: No murmur heard. Pulmonary:     Effort: Pulmonary effort is normal. No respiratory distress.     Breath sounds: Normal breath sounds.  Musculoskeletal:        General: Normal range of motion.     Cervical back: Normal range of motion and neck supple.     Right lower leg: No edema.     Left lower leg: No edema.   Skin:    General: Skin is warm and dry.  Neurological:     Mental Status: She is alert and oriented to person, place, and time.  Psychiatric:        Mood and Affect: Mood normal.        Behavior: Behavior normal.        Thought Content: Thought content normal.        Judgment: Judgment normal.     BP 108/80 (BP Location: Left Arm, Patient Position: Sitting, Cuff Size: Normal)   Pulse 89   Temp 98.1 F (36.7 C) (Oral)   Resp 18   Ht 5' 6 (1.676 m)   Wt 188 lb 3.2 oz (85.4 kg)   SpO2 100%   BMI 30.38 kg/m  Wt Readings from Last 3 Encounters:  08/25/23 188 lb 3.2 oz (85.4 kg)  08/21/23 188 lb 3.2 oz (85.4 kg)  07/08/23 167 lb 12.8 oz (76.1 kg)    Diabetic Foot Exam - Simple   No data filed    Lab Results  Component Value Date   WBC 6.2 04/11/2023   HGB 8.3 (L) 04/11/2023   HCT 25.3 (L) 04/11/2023   PLT 327 04/11/2023   GLUCOSE 69 08/21/2023   CHOL 174 08/21/2023   TRIG 80 08/21/2023   HDL 68 08/21/2023   LDLDIRECT 92.0 02/01/2015   LDLCALC 89 08/21/2023   ALT 11 04/11/2023   AST 20 04/11/2023   NA 139 08/21/2023   K 4.1 08/21/2023   CL 105 08/21/2023   CREATININE 1.10 (H) 08/21/2023   BUN 16 08/21/2023   CO2 27 08/21/2023   TSH 1.24 11/11/2021   INR 1.12 02/12/2015   HGBA1C 5.3 08/21/2023   MICROALBUR  3.4 (H) 02/14/2010    Lab Results  Component Value Date   TSH 1.24 11/11/2021   Lab Results  Component Value Date   WBC 6.2 04/11/2023   HGB 8.3 (L) 04/11/2023   HCT 25.3 (L) 04/11/2023   MCV 76.0 (L) 04/11/2023   PLT 327 04/11/2023   Lab Results  Component Value Date   NA 139 08/21/2023   K 4.1 08/21/2023   CO2 27 08/21/2023   GLUCOSE 69 08/21/2023   BUN 16 08/21/2023   CREATININE 1.10 (H) 08/21/2023   BILITOT 0.7 04/11/2023   ALKPHOS 68 04/11/2023   AST 20 04/11/2023   ALT 11 04/11/2023   PROT 6.7 04/11/2023   ALBUMIN 3.0 (L) 04/12/2023   CALCIUM  9.9 08/21/2023   ANIONGAP 11 04/12/2023   EGFR 61 08/21/2023   GFR 111.36 11/11/2021    Lab Results  Component Value Date   CHOL 174 08/21/2023   Lab Results  Component Value Date   HDL 68 08/21/2023   Lab Results  Component Value Date   LDLCALC 89 08/21/2023   Lab Results  Component Value Date   TRIG 80 08/21/2023   Lab Results  Component Value Date   CHOLHDL 2.6 08/21/2023   Lab Results  Component Value Date   HGBA1C 5.3 08/21/2023       Assessment & Plan:  High risk medication use  Generalized anxiety disorder  Stage 3 chronic kidney disease, unspecified whether stage 3a or 3b CKD [facility]) Assessment & Plan:   Chemistry      Component Value Date/Time   NA 139 08/21/2023 0808   NA 138 02/09/2019 1208   K 4.1 08/21/2023 0808   CL 105 08/21/2023 0808   CO2 27 08/21/2023 0808   BUN 16 08/21/2023 0808   BUN 14 02/09/2019 1208   CREATININE 1.10 (H) 08/21/2023 0808      Component Value Date/Time   CALCIUM  9.9 08/21/2023 0808   CALCIUM  10.4 05/12/2023 0000   ALKPHOS 68 04/11/2023 0546   AST 20 04/11/2023 0546   ALT 11 04/11/2023 0546   BILITOT 0.7 04/11/2023 0546   BILITOT <0.2 02/09/2019 1208     IMPROVED    Hyperlipidemia, unspecified hyperlipidemia type Assessment & Plan: Lab Results  Component Value Date   CHOL 174 08/21/2023   HDL 68 08/21/2023   LDLCALC 89 08/21/2023   LDLDIRECT 92.0 02/01/2015   TRIG 80 08/21/2023   CHOLHDL 2.6 08/21/2023  CON'T CRESTOR    Type 2 diabetes mellitus with hyperglycemia, without long-term current use of insulin  [facility]) Assessment & Plan: Lab Results  Component Value Date   HGBA1C 5.3 08/21/2023      Essential hypertension  Primary hypertension Assessment & Plan: Well controlled, no changes to meds. Encouraged heart healthy diet such as the DASH diet and exercise as tolerated.     Assessment and Plan Assessment & Plan Type 2 diabetes mellitus without complications   Type 2 diabetes mellitus is well-controlled with a recent HbA1c of 5.3. She has been taken off glimepiride  and is  currently on Felicity once a week, which aids in weight management by decreasing appetite. No complications such as foot issues or ankle swelling are present. Continue Felicity once a week and perform routine lab work, including urine analysis.  Hyperlipidemia   Hyperlipidemia is well-managed with excellent cholesterol levels as confirmed by recent lab results.   ++5  Libbi Towner R Lowne Chase, DO

## 2023-08-25 NOTE — Assessment & Plan Note (Signed)
 Chemistry      Component Value Date/Time   NA 139 08/21/2023 0808   NA 138 02/09/2019 1208   K 4.1 08/21/2023 0808   CL 105 08/21/2023 0808   CO2 27 08/21/2023 0808   BUN 16 08/21/2023 0808   BUN 14 02/09/2019 1208   CREATININE 1.10 (H) 08/21/2023 0808      Component Value Date/Time   CALCIUM  9.9 08/21/2023 0808   CALCIUM  10.4 05/12/2023 0000   ALKPHOS 68 04/11/2023 0546   AST 20 04/11/2023 0546   ALT 11 04/11/2023 0546   BILITOT 0.7 04/11/2023 0546   BILITOT <0.2 02/09/2019 1208     IMPROVED

## 2023-08-25 NOTE — Assessment & Plan Note (Signed)
 Lab Results  Component Value Date   HGBA1C 5.3 08/21/2023

## 2023-08-25 NOTE — Assessment & Plan Note (Signed)
 Well controlled, no changes to meds. Encouraged heart healthy diet such as the DASH diet and exercise as tolerated.

## 2023-08-26 ENCOUNTER — Encounter: Payer: Self-pay | Admitting: Radiology

## 2023-08-26 ENCOUNTER — Other Ambulatory Visit (HOSPITAL_COMMUNITY): Payer: Self-pay

## 2023-08-26 ENCOUNTER — Ambulatory Visit (INDEPENDENT_AMBULATORY_CARE_PROVIDER_SITE_OTHER): Admitting: Radiology

## 2023-08-26 ENCOUNTER — Other Ambulatory Visit (HOSPITAL_COMMUNITY)
Admission: RE | Admit: 2023-08-26 | Discharge: 2023-08-26 | Disposition: A | Discharge status: Home / Self Care | Source: Ambulatory Visit | Attending: Radiology | Admitting: Radiology

## 2023-08-26 VITALS — BP 110/76 | HR 68 | Ht 66.5 in | Wt 186.0 lb

## 2023-08-26 DIAGNOSIS — D219 Benign neoplasm of connective and other soft tissue, unspecified: Secondary | ICD-10-CM | POA: Diagnosis not present

## 2023-08-26 DIAGNOSIS — Z01419 Encounter for gynecological examination (general) (routine) without abnormal findings: Secondary | ICD-10-CM

## 2023-08-26 DIAGNOSIS — Z1331 Encounter for screening for depression: Secondary | ICD-10-CM

## 2023-08-26 DIAGNOSIS — N930 Postcoital and contact bleeding: Secondary | ICD-10-CM

## 2023-08-26 DIAGNOSIS — N952 Postmenopausal atrophic vaginitis: Secondary | ICD-10-CM

## 2023-08-26 DIAGNOSIS — Z79890 Hormone replacement therapy (postmenopausal): Secondary | ICD-10-CM

## 2023-08-26 DIAGNOSIS — Z1211 Encounter for screening for malignant neoplasm of colon: Secondary | ICD-10-CM

## 2023-08-26 MED ORDER — ESTRADIOL 0.075 MG/24HR TD PTTW
1.0000 | MEDICATED_PATCH | TRANSDERMAL | 4 refills | Status: AC
  Filled 2023-08-26 – 2023-09-20 (×2): qty 24, 84d supply, fill #0
  Filled 2023-12-29: qty 24, 84d supply, fill #1

## 2023-08-26 MED ORDER — ESTRADIOL 0.1 MG/GM VA CREA
1.0000 g | TOPICAL_CREAM | VAGINAL | 3 refills | Status: AC
  Filled 2023-08-26: qty 42.5, 90d supply, fill #0

## 2023-08-26 MED ORDER — PROGESTERONE MICRONIZED 100 MG PO CAPS
100.0000 mg | ORAL_CAPSULE | Freq: Every evening | ORAL | 4 refills | Status: AC
  Filled 2023-08-26: qty 90, 90d supply, fill #0
  Filled 2023-12-16: qty 90, 90d supply, fill #1

## 2023-08-26 NOTE — Progress Notes (Signed)
 Cheryl Hart [birth year] [id number]   History:  48 y.o. G0 presents for annual exam.Continues to have post coital bleeding. Fibroids seen on CT scan 5/25. Doing well on HRT. No bleeding any other time besides post coitally. Negative gc/ct testing 6/25, no new partners.   Gynecologic History Patient's last menstrual period was 05/22/2019.   Contraception/Family planning: post menopausal status Sexually active: yes  Last Pap: 2023. Results were: normal Last mammogram: 2024. Results were: normal scheduled this month  Obstetric History OB History  Gravida Para Term Preterm AB Living  1 1    1   SAB IAB Ectopic Multiple Live Births          # Outcome Date GA Lbr Len/2nd Weight Sex Type Anes PTL Lv  1 Para                08/26/2023    1:25 PM 08/25/2023    3:55 PM 08/25/2023    3:37 PM  Depression screen PHQ 2/9  Decreased Interest 0 0 0  Down, Depressed, Hopeless 0 0 0  PHQ - 2 Score 0 0 0  Altered sleeping  0 0  Tired, decreased energy  0 0  Change in appetite  0 0  Feeling bad or failure about yourself   0 0  Trouble concentrating  0 0  Moving slowly or fidgety/restless  0 0  Suicidal thoughts  0 0  PHQ-9 Score  0 0  Difficult doing work/chores  Not difficult at all Not difficult at all     The following portions of the patient's history were reviewed and updated as appropriate: allergies, current medications, past family history, past medical history, past social history, past surgical history, and problem list.  Review of Systems  All other systems reviewed and are negative.   Past medical history, past surgical history, family history and social history were all reviewed and documented in the EPIC chart.  Exam:  Vitals:   08/26/23 1324  BP: 110/76  Pulse: 68  SpO2: 99%  Weight: 186 lb (84.4 kg)  Height: 5' 6.5 (1.689 m)   Body mass index is 29.57 kg/m.  Physical Exam Vitals and nursing note reviewed. Exam conducted with a chaperone present.   Constitutional:      Appearance: Normal appearance. She is normal weight.  HENT:     Head: Normocephalic and atraumatic.  Neck:     Thyroid : No thyroid  mass, thyromegaly or thyroid  tenderness.  Cardiovascular:     Rate and Rhythm: Regular rhythm.     Heart sounds: Normal heart sounds.  Pulmonary:     Effort: Pulmonary effort is normal.     Breath sounds: Normal breath sounds.  Chest:  Breasts:    Breasts are symmetrical.     Right: Normal. No inverted nipple, mass, nipple discharge, skin change or tenderness.     Left: Normal. No inverted nipple, mass, nipple discharge, skin change or tenderness.  Abdominal:     General: Abdomen is flat. Bowel sounds are normal.     Palpations: Abdomen is soft.  Genitourinary:    General: Normal vulva.     Vagina: Normal. No vaginal discharge, bleeding or lesions.     Cervix: Normal. No discharge or lesion.     Uterus: Normal. Not enlarged and not tender.      Adnexa: Right adnexa normal and left adnexa normal.       Right: No mass, tenderness or fullness.         Left: No  mass, tenderness or fullness.    Lymphadenopathy:     Upper Body:     Right upper body: No axillary adenopathy.     Left upper body: No axillary adenopathy.  Skin:    General: Skin is warm and dry.  Neurological:     Mental Status: She is alert and oriented to person, place, and time.  Psychiatric:        Mood and Affect: Mood normal.        Thought Content: Thought content normal.        Judgment: Judgment normal.      Darice Hoit, CMA present for exam  Assessment/Plan:   1. Well woman exam with routine gynecological exam (Primary) - Cytology - PAP( [place])  2. Hormone replacement therapy (HRT) - estradiol  (VIVELLE -DOT) 0.075 MG/24HR; Place 1 patch onto the skin 2 (two) times a week.  Dispense: 24 patch; Refill: 4 - progesterone  (PROMETRIUM ) 100 MG capsule; Take 1 capsule (100 mg total) by mouth at bedtime.  Dispense: 90 capsule; Refill: 4  3. PCB (post  coital bleeding) - US  PELVIS TRANSVAGINAL NON-OB (TV ONLY); Future - Cytology - PAP( [place])  4. Fibroids - US  PELVIS TRANSVAGINAL NON-OB (TV ONLY); Future  5. Vaginal atrophy - estradiol  (ESTRACE  VAGINAL) 0.1 MG/GM vaginal cream; Place 1 g vaginally 3 (three) times a week.  Dispense: 42.5 g; Refill: 3  6. Depression screen   7. Colon cancer screening - Cologuard    Return for u/s only.  GINETTE COZIER B WHNP-BC 1:48 PM 08/26/2023

## 2023-08-27 ENCOUNTER — Other Ambulatory Visit (HOSPITAL_COMMUNITY): Payer: Self-pay

## 2023-08-27 ENCOUNTER — Ambulatory Visit: Payer: Self-pay | Admitting: Radiology

## 2023-08-27 ENCOUNTER — Other Ambulatory Visit: Payer: Self-pay | Admitting: Family Medicine

## 2023-08-27 DIAGNOSIS — B3731 Acute candidiasis of vulva and vagina: Secondary | ICD-10-CM

## 2023-08-27 DIAGNOSIS — F411 Generalized anxiety disorder: Secondary | ICD-10-CM

## 2023-08-27 LAB — CYTOLOGY - PAP
Comment: NEGATIVE
Diagnosis: NEGATIVE
High risk HPV: NEGATIVE

## 2023-08-27 MED ORDER — FLUCONAZOLE 150 MG PO TABS
150.0000 mg | ORAL_TABLET | ORAL | 0 refills | Status: AC
  Filled 2023-08-27: qty 2, 6d supply, fill #0

## 2023-08-28 ENCOUNTER — Other Ambulatory Visit (HOSPITAL_COMMUNITY): Payer: Self-pay

## 2023-08-28 ENCOUNTER — Other Ambulatory Visit: Payer: Self-pay

## 2023-08-28 LAB — DRUG MONITORING PANEL 376104, URINE

## 2023-08-28 LAB — DM TEMPLATE

## 2023-08-28 MED ORDER — ALPRAZOLAM 0.25 MG PO TABS
0.2500 mg | ORAL_TABLET | Freq: Three times a day (TID) | ORAL | 1 refills | Status: DC | PRN
  Filled 2023-08-28: qty 30, 10d supply, fill #0
  Filled 2023-09-20: qty 30, 10d supply, fill #1

## 2023-08-28 NOTE — Telephone Encounter (Signed)
 Requesting: Xanax  Contract: 08/25/23 UDS:    Last OV:   Next OV: 02/25/2024 Last Refill: 07/09/23, #30---1 RF Database:   Please advise

## 2023-09-01 ENCOUNTER — Other Ambulatory Visit

## 2023-09-01 ENCOUNTER — Other Ambulatory Visit: Payer: Self-pay | Admitting: Radiology

## 2023-09-01 DIAGNOSIS — D219 Benign neoplasm of connective and other soft tissue, unspecified: Secondary | ICD-10-CM

## 2023-09-01 DIAGNOSIS — N930 Postcoital and contact bleeding: Secondary | ICD-10-CM

## 2023-09-03 ENCOUNTER — Ambulatory Visit: Payer: Self-pay | Admitting: Radiology

## 2023-09-04 ENCOUNTER — Other Ambulatory Visit (HOSPITAL_COMMUNITY): Payer: Self-pay

## 2023-09-07 ENCOUNTER — Ambulatory Visit

## 2023-09-07 ENCOUNTER — Ambulatory Visit
Admission: RE | Admit: 2023-09-07 | Discharge: 2023-09-07 | Disposition: A | Discharge status: Home / Self Care | Source: Ambulatory Visit | Attending: Family Medicine | Admitting: Family Medicine

## 2023-09-07 DIAGNOSIS — Z1231 Encounter for screening mammogram for malignant neoplasm of breast: Secondary | ICD-10-CM

## 2023-09-07 NOTE — Telephone Encounter (Signed)
 09/01/23 PUS results reviewed.   Call placed to patient, left detailed message, ok [er dpr. Advised to return call to office [phone], opt 1 to schedule surgical consult with with Dr. Dallie or Dr. Glennon; opt 4 if any additional questions.

## 2023-09-08 NOTE — Telephone Encounter (Signed)
 Per review of EPIC, patient is scheduled for consult with Dr. Dallie on 09/09/23.   Routing FYI.   Encounter closed.

## 2023-09-09 ENCOUNTER — Encounter: Payer: Self-pay | Admitting: Obstetrics and Gynecology

## 2023-09-09 ENCOUNTER — Ambulatory Visit (INDEPENDENT_AMBULATORY_CARE_PROVIDER_SITE_OTHER): Admitting: Obstetrics and Gynecology

## 2023-09-09 VITALS — BP 104/62 | HR 100 | Temp 98.1°F | Ht 66.5 in | Wt 189.0 lb

## 2023-09-09 DIAGNOSIS — N888 Other specified noninflammatory disorders of cervix uteri: Secondary | ICD-10-CM

## 2023-09-09 DIAGNOSIS — N930 Postcoital and contact bleeding: Secondary | ICD-10-CM | POA: Insufficient documentation

## 2023-09-09 NOTE — Assessment & Plan Note (Addendum)
 PCB x 3 months, bleeding is heavy and lasts for 1.5d W/u notable for TVUS with vaginal cyst, on my review cyst could be cervical. Will review imaging with sonographer On exam, swelling of RLQ of cervix is noted, indicating possible nabothian's or other cervical cyst Normal PAP Recommend EMB, consider drainage at that time Discussed surveillance with TVUS vs hysterectomy if EMB is normal Patient would like surveillance at this time

## 2023-09-09 NOTE — Progress Notes (Signed)
 48 y.o. G59P1001 female with known fibroid, PCB, ?vaginal cyst, CKD- stage 3, T2DM, prior Novasure (2019- AUB-F), on HRT here for surgical consult. Single. Seen by DOROTHA Bland, NP.  Patient's last menstrual period was 05/22/2019. Prior endometrial ablation.  She reports heavy PCB x1.5d following intercourse, ongoing since May. She also reports regular pelvic pressure. She was initially seen in June 2025 for PCB by DOROTHA Bland, NP. New partner since May 2025. No prior issues with PCB. No other bleeding issues, cycles stopped after Novasure She was also having dyspareunia-, however she has since started PV estrace  and dyspareunia has resolved. However, bleeding has continued. She stopped estrace  ~2wk ago.  07/08/23 W/u: [place] neg Wet prep- neg Ucx neg    Component Value Date/Time   DIAGPAP  08/26/2023 1346    - Negative for intraepithelial lesion or malignancy (NILM)   DIAGPAP  11/26/2021 1147    - Negative for intraepithelial lesion or malignancy (NILM)   DIAGPAP  02/09/2017 0000    NEGATIVE FOR INTRAEPITHELIAL LESIONS OR MALIGNANCY.   HPVHIGH Negative 08/26/2023 1346   HPVHIGH Negative 11/26/2021 1147   ADEQPAP  08/26/2023 1346    Satisfactory for evaluation; transformation zone component PRESENT.   ADEQPAP  11/26/2021 1147    Satisfactory for evaluation; transformation zone component PRESENT.   ADEQPAP  02/09/2017 0000    Satisfactory for evaluation  endocervical/transformation zone component PRESENT.   09/01/23 TVUS: Anteverted uterus, flexed and c-section scar normal size and shape  One visible calcified fibroid noted   20x63mm cyst of vagina noted at the top of the vault   Thin symmetrical endometrium 2.92 mm, views of endometrium were limited No masses or thickening seen   Both ovaries normal size with normal follicle pattern and perfusion   No adnexal masses   No free fluid   Impression: calcified fibroid, vaginal cyst   GYN HISTORY: No sig  hx  OB History  Gravida Para Term Preterm AB Living  1 1 1   1   SAB IAB Ectopic Multiple Live Births      1    # Outcome Date GA Lbr Len/2nd Weight Sex Type Anes PTL Lv  1 Term      CS-Unspec   LIV   Past Medical History:  Diagnosis Date   Anemia    Anxiety    Arthritis    KNEE RIGHT   Blood transfusion without reported diagnosis    Depression    Diabetes mellitus    History of kidney stones    Hyperlipidemia    Hypertension    PT.DENIES,MEDS FOR KIDNEY AND PALPATIONS   PCOS (polycystic ovarian syndrome)    Uterine fibroid    Vitamin B12 deficiency    Past Surgical History:  Procedure Laterality Date   CESAREAN SECTION     CHOLECYSTECTOMY     endometrial ablation     g1 p1     LAPAROSCOPIC GELPORT ASSISTED MYOMECTOMY  02/12/2015   Dr Sherie   TOTAL KNEE ARTHROPLASTY Right 03/05/2022   Procedure: RIGHT TOTAL KNEE ARTHROPLASTY;  Surgeon: Harden Jerona GAILS, MD;  Location: [facility] OR;  Service: Orthopedics;  Laterality: Right;   Current Outpatient Medications on File Prior to Visit  Medication Sig Dispense Refill   acetaminophen  (TYLENOL ) 325 MG tablet Take 2 tablets (650 mg total) by mouth every 6 (six) hours as needed for mild pain (pain score 1-3), headache or moderate pain (pain score 4-6).     ALPRAZolam  (XANAX ) 0.25 MG tablet Take 1 tablet (  0.25 mg total) by mouth 3 (three) times daily as needed. 30 tablet 1   Blood Glucose Monitoring Suppl (BLOOD GLUCOSE MONITOR SYSTEM) w/Device KIT use as drected to check blood sugar 1 kit 0   Continuous Glucose Sensor (FREESTYLE LIBRE 3 PLUS SENSOR) MISC Use to continuously monitor blood glucose, changing the sensor every 15 days. 6 each 3   Dulaglutide  (TRULICITY ) 3 MG/0.5ML SOAJ Inject 3 mg as directed once a week. 6 mL 3   DULoxetine  (CYMBALTA ) 60 MG capsule Take 1 capsule (60 mg total) by mouth daily. 90 capsule 0   estradiol  (ESTRACE  VAGINAL) 0.1 MG/GM vaginal cream Place 1 g vaginally 3 (three) times a week. 42.5 g 3   estradiol   (VIVELLE -DOT) 0.075 MG/24HR Place 1 patch onto the skin 2 (two) times a week. 24 patch 4   ferrous sulfate  325 (65 FE) MG tablet Take 1 tablet (325 mg total) by mouth 2 (two) times daily with a meal. 30 tablet 1   glucose blood (FREESTYLE LITE) test strip Use as instructed to check blood sugar 2 times daily 100 each 5   loratadine  (CLARITIN ) 10 MG tablet Take 1 tablet (10 mg total) by mouth daily. 30 tablet 0   losartan  (COZAAR ) 25 MG tablet Take 1 tablet (25 mg total) by mouth daily. 30 tablet 6   metoprolol  succinate (TOPROL -XL) 50 MG 24 hr tablet Take 1 tablet (50 mg total) by mouth daily. Take with or immediately following a meal. 90 tablet 0   Multiple Vitamins-Minerals (WOMENS MULTIVITAMIN GUMMIES) CHEW Chew 2 each by mouth daily.     progesterone  (PROMETRIUM ) 100 MG capsule Take 1 capsule (100 mg total) by mouth at bedtime. 90 capsule 4   rosuvastatin  (CRESTOR ) 40 MG tablet Take 1 tablet (40 mg total) by mouth daily. 90 tablet 3   sodium bicarbonate  650 MG tablet Take 1 tablet by mouth daily 60 tablet 6   Vitamin D , Ergocalciferol , (DRISDOL ) 1.25 MG (50000 UNIT) CAPS capsule Take 1 capsule (50,000 Units total) by mouth every 7 (seven) days. 12 capsule 1   No current facility-administered medications on file prior to visit.   Allergies  Allergen Reactions   Sulfa Antibiotics Anaphylaxis and Rash   Sulfonamide Derivatives Anaphylaxis, Photosensitivity, Rash and Other (See Comments)    Severe headache     PE Today's Vitals   09/09/23 1137  BP: 104/62  Pulse: 100  Temp: 98.1 F (36.7 C)  TempSrc: Oral  SpO2: 99%  Weight: 189 lb (85.7 kg)  Height: 5' 6.5 (1.689 m)   Body mass index is 30.05 kg/m.  Physical Exam Vitals reviewed. Exam conducted with a chaperone present.  Constitutional:      General: She is not in acute distress.    Appearance: Normal appearance.  HENT:     Head: Normocephalic and atraumatic.     Nose: Nose normal.  Eyes:     Extraocular Movements:  Extraocular movements intact.     Conjunctiva/sclera: Conjunctivae normal.  Pulmonary:     Effort: Pulmonary effort is normal.  Genitourinary:    General: Normal vulva.     Exam position: Lithotomy position.     Vagina: Normal. No vaginal discharge.     Cervix: Normal. No cervical motion tenderness, discharge or lesion.     Uterus: Normal. Not enlarged and not tender.      Adnexa: Right adnexa normal and left adnexa normal.        Comments: Mild swelling of RLQ of cervix, normal ectocervix, no  erythema, no drainage, + tender to palpation Musculoskeletal:        General: Normal range of motion.     Cervical back: Normal range of motion.  Neurological:     General: No focal deficit present.     Mental Status: She is alert.  Psychiatric:        Mood and Affect: Mood normal.        Behavior: Behavior normal.      Assessment and Plan:        PCB (post coital bleeding) Assessment & Plan: PCB x 3 months, bleeding is heavy and lasts for 1.5d W/u notable for TVUS with vaginal cyst, on my review cyst could be cervical. Will review imaging with sonographer On exam, swelling of RLQ of cervix is noted, indicating possible nabothian's or other cervical cyst Normal PAP Recommend EMB, consider drainage at that time Discussed surveillance with TVUS vs hysterectomy if EMB is normal Patient would like surveillance at this time  Orders: -     Endometrial biopsy; Future -     US  PELVIS TRANSVAGINAL NON-OB (TV ONLY); Future  Cervical cyst -     US  PELVIS TRANSVAGINAL NON-OB (TV ONLY); Future    Vera LULLA Pa, MD

## 2023-09-20 ENCOUNTER — Other Ambulatory Visit (HOSPITAL_COMMUNITY): Payer: Self-pay

## 2023-09-20 ENCOUNTER — Other Ambulatory Visit: Payer: Self-pay | Admitting: Family Medicine

## 2023-09-20 DIAGNOSIS — F339 Major depressive disorder, recurrent, unspecified: Secondary | ICD-10-CM

## 2023-09-20 DIAGNOSIS — E1142 Type 2 diabetes mellitus with diabetic polyneuropathy: Secondary | ICD-10-CM

## 2023-09-21 ENCOUNTER — Other Ambulatory Visit (HOSPITAL_COMMUNITY): Payer: Self-pay

## 2023-09-21 ENCOUNTER — Other Ambulatory Visit: Payer: Self-pay

## 2023-09-21 MED ORDER — DULOXETINE HCL 60 MG PO CPEP
60.0000 mg | ORAL_CAPSULE | Freq: Every day | ORAL | 1 refills | Status: AC
  Filled 2023-09-21: qty 90, 90d supply, fill #0
  Filled 2023-12-29: qty 90, 90d supply, fill #1

## 2023-09-22 ENCOUNTER — Other Ambulatory Visit (HOSPITAL_COMMUNITY): Payer: Self-pay

## 2023-09-24 ENCOUNTER — Other Ambulatory Visit (HOSPITAL_COMMUNITY): Payer: Self-pay

## 2023-09-25 ENCOUNTER — Other Ambulatory Visit (HOSPITAL_COMMUNITY): Payer: Self-pay

## 2023-09-25 LAB — COLOGUARD: COLOGUARD: NEGATIVE

## 2023-10-12 ENCOUNTER — Ambulatory Visit: Admitting: Obstetrics and Gynecology

## 2023-10-12 NOTE — Progress Notes (Deleted)
 48 y.o. G30P1001 female with known fibroid, PCB, ?vaginal vs cervical cyst, CKD- stage 3, T2DM, prior Novasure (2019- AUB-F), on HRT here for surgical consult. Single. Seen by DOROTHA Bland, NP.  Patient's last menstrual period was 05/22/2019. Prior endometrial ablation.  She reports heavy PCB x1.5d following intercourse, ongoing since May. She also reports regular pelvic pressure. She was initially seen in June 2025 for PCB by DOROTHA Bland, NP. New partner since May 2025. No prior issues with PCB. No other bleeding issues, cycles stopped after Novasure She was also having dyspareunia-, however she has since started PV estrace  and dyspareunia has resolved. However, bleeding has continued. She stopped estrace  ~2wk ago.  07/08/23 W/u: [place] neg Wet prep- neg Ucx neg    Component Value Date/Time   DIAGPAP  08/26/2023 1346    - Negative for intraepithelial lesion or malignancy (NILM)   DIAGPAP  11/26/2021 1147    - Negative for intraepithelial lesion or malignancy (NILM)   DIAGPAP  02/09/2017 0000    NEGATIVE FOR INTRAEPITHELIAL LESIONS OR MALIGNANCY.   HPVHIGH Negative 08/26/2023 1346   HPVHIGH Negative 11/26/2021 1147   ADEQPAP  08/26/2023 1346    Satisfactory for evaluation; transformation zone component PRESENT.   ADEQPAP  11/26/2021 1147    Satisfactory for evaluation; transformation zone component PRESENT.   ADEQPAP  02/09/2017 0000    Satisfactory for evaluation  endocervical/transformation zone component PRESENT.   09/01/23 TVUS: Anteverted uterus, flexed and c-section scar normal size and shape  One visible calcified fibroid noted   20x53mm cyst of vagina noted at the top of the vault   Thin symmetrical endometrium 2.92 mm, views of endometrium were limited No masses or thickening seen   Both ovaries normal size with normal follicle pattern and perfusion   No adnexal masses   No free fluid   Impression: calcified fibroid, vaginal cyst   GYN  HISTORY: No sig hx  OB History  Gravida Para Term Preterm AB Living  1 1 1   1   SAB IAB Ectopic Multiple Live Births      1    # Outcome Date GA Lbr Len/2nd Weight Sex Type Anes PTL Lv  1 Term      CS-Unspec   LIV   Past Medical History:  Diagnosis Date   Anemia    Anxiety    Arthritis    KNEE RIGHT   Blood transfusion without reported diagnosis    Depression    Diabetes mellitus    History of kidney stones    Hyperlipidemia    Hypertension    PT.DENIES,MEDS FOR KIDNEY AND PALPATIONS   PCOS (polycystic ovarian syndrome)    Uterine fibroid    Vitamin B12 deficiency    Past Surgical History:  Procedure Laterality Date   CESAREAN SECTION     CHOLECYSTECTOMY     endometrial ablation     g1 p1     LAPAROSCOPIC GELPORT ASSISTED MYOMECTOMY  02/12/2015   Dr Sherie   TOTAL KNEE ARTHROPLASTY Right 03/05/2022   Procedure: RIGHT TOTAL KNEE ARTHROPLASTY;  Surgeon: Harden Jerona GAILS, MD;  Location: [facility] OR;  Service: Orthopedics;  Laterality: Right;   Current Outpatient Medications on File Prior to Visit  Medication Sig Dispense Refill   acetaminophen  (TYLENOL ) 325 MG tablet Take 2 tablets (650 mg total) by mouth every 6 (six) hours as needed for mild pain (pain score 1-3), headache or moderate pain (pain score 4-6).     ALPRAZolam  (XANAX ) 0.25 MG tablet Take  1 tablet (0.25 mg total) by mouth 3 (three) times daily as needed. 30 tablet 1   Blood Glucose Monitoring Suppl (BLOOD GLUCOSE MONITOR SYSTEM) w/Device KIT use as drected to check blood sugar 1 kit 0   Continuous Glucose Sensor (FREESTYLE LIBRE 3 PLUS SENSOR) MISC Use to continuously monitor blood glucose, changing the sensor every 15 days. 6 each 3   Dulaglutide  (TRULICITY ) 3 MG/0.5ML SOAJ Inject 3 mg as directed once a week. 6 mL 3   DULoxetine  (CYMBALTA ) 60 MG capsule Take 1 capsule (60 mg total) by mouth daily. 90 capsule 1   estradiol  (ESTRACE  VAGINAL) 0.1 MG/GM vaginal cream Place 1 g vaginally 3 (three) times a week. 42.5 g 3    estradiol  (VIVELLE -DOT) 0.075 MG/24HR Place 1 patch onto the skin 2 (two) times a week. 24 patch 4   ferrous sulfate  325 (65 FE) MG tablet Take 1 tablet (325 mg total) by mouth 2 (two) times daily with a meal. 30 tablet 1   glucose blood (FREESTYLE LITE) test strip Use as instructed to check blood sugar 2 times daily 100 each 5   loratadine  (CLARITIN ) 10 MG tablet Take 1 tablet (10 mg total) by mouth daily. 30 tablet 0   losartan  (COZAAR ) 25 MG tablet Take 1 tablet (25 mg total) by mouth daily. 30 tablet 6   metoprolol  succinate (TOPROL -XL) 50 MG 24 hr tablet Take 1 tablet (50 mg total) by mouth daily. Take with or immediately following a meal. 90 tablet 0   Multiple Vitamins-Minerals (WOMENS MULTIVITAMIN GUMMIES) CHEW Chew 2 each by mouth daily.     progesterone  (PROMETRIUM ) 100 MG capsule Take 1 capsule (100 mg total) by mouth at bedtime. 90 capsule 4   rosuvastatin  (CRESTOR ) 40 MG tablet Take 1 tablet (40 mg total) by mouth daily. 90 tablet 3   sodium bicarbonate  650 MG tablet Take 1 tablet by mouth daily 60 tablet 6   Vitamin D , Ergocalciferol , (DRISDOL ) 1.25 MG (50000 UNIT) CAPS capsule Take 1 capsule (50,000 Units total) by mouth every 7 (seven) days. 12 capsule 1   No current facility-administered medications on file prior to visit.   Allergies  Allergen Reactions   Sulfa Antibiotics Anaphylaxis and Rash   Sulfonamide Derivatives Anaphylaxis, Photosensitivity, Rash and Other (See Comments)    Severe headache     PE There were no vitals filed for this visit.  There is no height or weight on file to calculate BMI.  Physical Exam Vitals reviewed. Exam conducted with a chaperone present.  Constitutional:      General: She is not in acute distress.    Appearance: Normal appearance.  HENT:     Head: Normocephalic and atraumatic.     Nose: Nose normal.  Eyes:     Extraocular Movements: Extraocular movements intact.     Conjunctiva/sclera: Conjunctivae normal.  Pulmonary:      Effort: Pulmonary effort is normal.  Genitourinary:    General: Normal vulva.     Exam position: Lithotomy position.     Vagina: Normal. No vaginal discharge.     Cervix: Normal. No cervical motion tenderness, discharge or lesion.     Uterus: Normal. Not enlarged and not tender.      Adnexa: Right adnexa normal and left adnexa normal.        Comments: Mild swelling of RLQ of cervix, normal ectocervix, no erythema, no drainage, + tender to palpation Musculoskeletal:        General: Normal range of motion.  Cervical back: Normal range of motion.  Neurological:     General: No focal deficit present.     Mental Status: She is alert.  Psychiatric:        Mood and Affect: Mood normal.        Behavior: Behavior normal.     Procedure Consent was signed. Timeout was performed. Speculum inserted into the vagina, cervix visualized and was prepped with Betadine.  Cervical block was performed with 10cc 1% lidocaine  (Lot#: ***, Exp ***). A single-toothed tenaculum was placed on the anterior lip of the cervix to stabilize it.  The 3 mm pipelle was introduced into the endometrial cavity without difficulty to a depth of ***cm, suction initiated and a moderate amount of tissue was obtained and sent to pathology.  The instruments were removed from the patient's vagina.  Minimal bleeding from the cervix was noted.  The patient tolerated the procedure well.    Assessment and Plan:        There are no diagnoses linked to this encounter.   Vera LULLA Pa, MD

## 2023-10-16 ENCOUNTER — Other Ambulatory Visit (HOSPITAL_COMMUNITY): Payer: Self-pay

## 2023-10-16 ENCOUNTER — Other Ambulatory Visit: Payer: Self-pay | Admitting: Family Medicine

## 2023-10-16 DIAGNOSIS — F411 Generalized anxiety disorder: Secondary | ICD-10-CM

## 2023-10-16 MED ORDER — ALPRAZOLAM 0.25 MG PO TABS
0.2500 mg | ORAL_TABLET | Freq: Three times a day (TID) | ORAL | 1 refills | Status: DC | PRN
  Filled 2023-10-16: qty 30, 10d supply, fill #0
  Filled 2023-10-27: qty 30, 10d supply, fill #1

## 2023-10-16 NOTE — Telephone Encounter (Signed)
 Requesting: alprazolam  0.25mg   Contract: 08/25/23 UDS: 08/25/23 Last Visit: 08/25/23  Next Visit: 02/25/24 Last Refill: 08/28/23 #30 and 0RF   Please Advise

## 2023-10-27 ENCOUNTER — Encounter: Payer: Self-pay | Admitting: Family Medicine

## 2023-10-27 NOTE — Telephone Encounter (Signed)
Rx not on med list . Please advise

## 2023-10-28 ENCOUNTER — Other Ambulatory Visit: Payer: Self-pay | Admitting: Family Medicine

## 2023-10-28 ENCOUNTER — Other Ambulatory Visit: Payer: Self-pay

## 2023-10-28 DIAGNOSIS — B354 Tinea corporis: Secondary | ICD-10-CM

## 2023-10-28 MED ORDER — NYSTATIN 100000 UNIT/GM EX POWD
Freq: Two times a day (BID) | CUTANEOUS | 3 refills | Status: AC
  Filled 2023-10-28: qty 60, 30d supply, fill #0
  Filled 2023-12-29 – 2024-01-26 (×4): qty 60, 30d supply, fill #1

## 2023-10-29 ENCOUNTER — Other Ambulatory Visit (HOSPITAL_COMMUNITY): Payer: Self-pay

## 2023-11-02 ENCOUNTER — Encounter: Payer: Self-pay | Admitting: Obstetrics and Gynecology

## 2023-11-02 ENCOUNTER — Encounter: Payer: Self-pay | Admitting: Radiology

## 2023-11-13 ENCOUNTER — Telehealth: Payer: Self-pay | Admitting: Obstetrics and Gynecology

## 2023-11-13 NOTE — Telephone Encounter (Signed)
 Patient no show to her endo biopsy appointment on September 29 and we had to cancel her ultrasound appointment on November 25 because Dr Dallie is going to be out of the office that day. Two messages were left and letter mailed asking patient to call and reschedule both appointments; patient has not rescheduled.

## 2023-11-16 ENCOUNTER — Encounter: Payer: Self-pay | Admitting: Radiology

## 2023-11-25 NOTE — Telephone Encounter (Signed)
 Left message to call Noreene Larsson, RN at [facility], [phone], option 5.

## 2023-11-26 NOTE — Telephone Encounter (Signed)
 Call returned to patient, left detailed message, ok per dpr. Advised f/u on r/s of PUS and EMB, return call to office at [phone], opt 1 to schedule; opt 4 if you have additional questions.

## 2023-11-27 ENCOUNTER — Other Ambulatory Visit (HOSPITAL_COMMUNITY): Payer: Self-pay

## 2023-12-01 ENCOUNTER — Encounter: Payer: Self-pay | Admitting: Family Medicine

## 2023-12-01 NOTE — Telephone Encounter (Signed)
 Spoke with patient, PUS and EMB scheduled for 12/31/23 at 1000, consult to follow at 1030 with Dr. Dallie. Patient declined earlier dates.   Routing to provider for final review. Patient is agreeable to disposition. Will close encounter.

## 2023-12-01 NOTE — Telephone Encounter (Signed)
 Left message to call [facility] Triage at [phone], option 4.

## 2023-12-03 ENCOUNTER — Other Ambulatory Visit: Payer: Self-pay | Admitting: Family Medicine

## 2023-12-03 ENCOUNTER — Other Ambulatory Visit: Payer: Self-pay

## 2023-12-03 ENCOUNTER — Other Ambulatory Visit (HOSPITAL_COMMUNITY): Payer: Self-pay

## 2023-12-03 DIAGNOSIS — F411 Generalized anxiety disorder: Secondary | ICD-10-CM

## 2023-12-03 MED ORDER — ALPRAZOLAM 0.25 MG PO TABS
0.2500 mg | ORAL_TABLET | Freq: Three times a day (TID) | ORAL | 1 refills | Status: DC | PRN
  Filled 2023-12-03: qty 30, 10d supply, fill #0
  Filled 2023-12-29 – 2024-01-04 (×3): qty 30, 10d supply, fill #1

## 2023-12-03 NOTE — Telephone Encounter (Signed)
 Requesting: alprazolam  0.25mg  Contract:08/25/23 UDS:08/25/23 Last Visit:08/25/23 Next Visit:02/25/24 Last Refill: 10/16/23 #30 and 0RF  Please Advise

## 2023-12-08 ENCOUNTER — Other Ambulatory Visit

## 2023-12-08 ENCOUNTER — Other Ambulatory Visit: Admitting: Radiology

## 2023-12-16 ENCOUNTER — Other Ambulatory Visit (HOSPITAL_COMMUNITY): Payer: Self-pay

## 2023-12-30 ENCOUNTER — Telehealth: Payer: Self-pay

## 2023-12-30 ENCOUNTER — Other Ambulatory Visit (HOSPITAL_COMMUNITY): Payer: Self-pay

## 2023-12-30 ENCOUNTER — Other Ambulatory Visit: Payer: Self-pay

## 2023-12-30 NOTE — Telephone Encounter (Signed)
 Prior authorization request received from covermymeds for progesterone  100 mg.   KEY: [id number] PA already processed and approved through 02/24/24.

## 2023-12-31 ENCOUNTER — Other Ambulatory Visit: Admitting: Obstetrics and Gynecology

## 2023-12-31 ENCOUNTER — Other Ambulatory Visit

## 2024-01-04 ENCOUNTER — Other Ambulatory Visit: Payer: Self-pay

## 2024-01-04 ENCOUNTER — Other Ambulatory Visit (HOSPITAL_COMMUNITY): Payer: Self-pay

## 2024-01-12 ENCOUNTER — Other Ambulatory Visit: Payer: Self-pay | Admitting: Family Medicine

## 2024-01-12 ENCOUNTER — Other Ambulatory Visit (HOSPITAL_COMMUNITY): Payer: Self-pay

## 2024-01-12 ENCOUNTER — Other Ambulatory Visit: Payer: Self-pay

## 2024-01-12 DIAGNOSIS — F411 Generalized anxiety disorder: Secondary | ICD-10-CM

## 2024-01-12 MED ORDER — ALPRAZOLAM 0.25 MG PO TABS
0.2500 mg | ORAL_TABLET | Freq: Three times a day (TID) | ORAL | 1 refills | Status: AC | PRN
  Filled 2024-01-12 – 2024-01-26 (×2): qty 30, 10d supply, fill #0
  Filled 2024-02-19: qty 30, 10d supply, fill #1

## 2024-01-12 NOTE — Telephone Encounter (Signed)
 Requesting: alprazolam  0.25mg  Contract:08/25/23 UDS:08/25/23 Last Visit: 08/25/23 Next Visit:02/25/24 Last Refill: 12/03/23 #30 and 0RF   Please Advise

## 2024-01-21 ENCOUNTER — Other Ambulatory Visit (HOSPITAL_COMMUNITY): Payer: Self-pay

## 2024-01-21 MED ORDER — AMOXICILLIN 500 MG PO CAPS
ORAL_CAPSULE | ORAL | 0 refills | Status: AC
  Filled 2024-01-21: qty 21, 7d supply, fill #0

## 2024-01-24 ENCOUNTER — Encounter: Payer: Self-pay | Admitting: Family Medicine

## 2024-01-25 ENCOUNTER — Other Ambulatory Visit (HOSPITAL_COMMUNITY): Payer: Self-pay

## 2024-01-25 ENCOUNTER — Other Ambulatory Visit: Payer: Self-pay | Admitting: Family Medicine

## 2024-01-25 DIAGNOSIS — B3731 Acute candidiasis of vulva and vagina: Secondary | ICD-10-CM

## 2024-01-25 MED ORDER — FLUCONAZOLE 150 MG PO TABS
150.0000 mg | ORAL_TABLET | Freq: Every day | ORAL | 0 refills | Status: AC
  Filled 2024-01-25: qty 2, 4d supply, fill #0

## 2024-01-26 ENCOUNTER — Other Ambulatory Visit: Admitting: Obstetrics and Gynecology

## 2024-01-26 ENCOUNTER — Other Ambulatory Visit

## 2024-01-27 ENCOUNTER — Other Ambulatory Visit (HOSPITAL_COMMUNITY): Payer: Self-pay

## 2024-01-27 ENCOUNTER — Other Ambulatory Visit: Payer: Self-pay

## 2024-02-04 ENCOUNTER — Other Ambulatory Visit: Admitting: Obstetrics and Gynecology

## 2024-02-04 ENCOUNTER — Other Ambulatory Visit

## 2024-02-04 DIAGNOSIS — Z01812 Encounter for preprocedural laboratory examination: Secondary | ICD-10-CM

## 2024-02-18 ENCOUNTER — Encounter: Payer: Self-pay | Admitting: Internal Medicine

## 2024-02-19 ENCOUNTER — Telehealth (HOSPITAL_COMMUNITY): Payer: Self-pay

## 2024-02-19 ENCOUNTER — Telehealth: Admitting: Internal Medicine

## 2024-02-19 ENCOUNTER — Other Ambulatory Visit (HOSPITAL_COMMUNITY): Payer: Self-pay

## 2024-02-19 ENCOUNTER — Other Ambulatory Visit: Payer: Self-pay | Admitting: Family Medicine

## 2024-02-19 NOTE — Progress Notes (Unsigned)
 Virtual Visit via Video Note  I connected with Cheryl Hart  on 02/19/24 at  7:30 AM EST by a video enabled telemedicine application and verified that I am speaking with the correct person using two identifiers.   I discussed the limitations of evaluation and management by telemedicine and the availability of in person appointments. The patient expressed understanding and agreed to proceed.   -Location of the patient : -Location of the provider : Office -The names of all persons participating in the telemedicine service : Pt and myself          Name: Cheryl Hart  Age/ Sex: 49 y.o., female   MRN/ DOB: [id number], [birth year]     PCP: Cheryl Cyndee Jamee JONELLE, DO   Reason for Endocrinology Evaluation: Type 2 Diabetes Mellitus  Initial Endocrine Consultative Visit: 12/01/2018    PATIENT IDENTIFIER: [name] is a 49 y.o. female with a past medical history of HTN, T2DM, and dyslipidemia  . The patient has followed with Endocrinology clinic since 12/01/2018 for consultative assistance with management of her diabetes.  DIABETIC HISTORY:  [name] was diagnosed with DM in 2010. She has tried invokana  but didn't;t feel good on it , has been unable to afford Januvia  or victoza  in the past.  Her hemoglobin A1c has ranged from 7.4% in 2018, peaking at 11.1% in 2019.   On her initial visit to our clinic she had an A1c of 9.0%  , she was on Glimepiride  and metformin  . 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     Today (02/19/2024): [name] is here for a f/u on  diabetes.She was using freestyle libre but developed pruritus  , she just received glucose meter but has used it yet .  Unknown hypoglycemic episode  Has noted vaginal irritation if she holds using too long  No nausea or vomiting  Denies constipation or diarrhea    HOME DIABETES REGIMEN:  Trulicity  3 mg weekly Rosuvastatin  40 mg daily     Statin: Yes ACE-I/ARB: yes   CONTINUOUS GLUCOSE MONITORING RECORD INTERPRETATION  : N/A   DIABETIC COMPLICATIONS: Microvascular complications:    Denies: CKD, retinopathy , neuropathy  Last eye exam: Completed 09/2022   Macrovascular complications:    Denies: CAD, PVD, CVA   HISTORY:  Past Medical History:  Past Medical History:  Diagnosis Date   Anemia    Anxiety    Arthritis    KNEE RIGHT   Blood transfusion without reported diagnosis    Depression    Diabetes mellitus    History of kidney stones    Hyperlipidemia    Hypertension    PT.DENIES,MEDS FOR KIDNEY AND PALPATIONS   PCOS (polycystic ovarian syndrome)    Uterine fibroid    Vitamin B12 deficiency    Past Surgical History:  Past Surgical History:  Procedure Laterality Date   CESAREAN SECTION     CHOLECYSTECTOMY     endometrial ablation     g1 p1     LAPAROSCOPIC GELPORT ASSISTED MYOMECTOMY  02/12/2015   Dr  Hoss   TOTAL KNEE ARTHROPLASTY Right 03/05/2022   Procedure: RIGHT TOTAL KNEE ARTHROPLASTY;  Surgeon: Harden Jerona GAILS, MD;  Location: [facility] OR;  Service: Orthopedics;  Laterality: Right;   Social History:  reports that she has never smoked. She has been exposed to tobacco smoke. She has never used smokeless tobacco. She reports current alcohol use. She reports that she does not use drugs. Family History:  Family History  Problem Relation Age of Onset   Crohn's disease Mother    Diabetes Mother    Hypertension Mother    Hyperlipidemia Mother    Kidney disease Mother    Thyroid  disease Mother    Anxiety disorder Mother    Diabetes Father    Hypertension  Father    Anxiety disorder Father    Liver disease Father    Alcoholism Father    Drug abuse Father    Depression Father    Breast cancer Maternal Grandmother 65   Diabetes Brother    Migraines Other    Colon cancer Neg Hx    Colon polyps Neg Hx    Esophageal cancer Neg Hx    Rectal cancer Neg Hx    Stomach cancer Neg Hx    Ulcerative colitis Neg Hx      HOME MEDICATIONS: Allergies as of 02/19/2024       Reactions   Sulfa Antibiotics Anaphylaxis, Rash   Sulfonamide Derivatives Anaphylaxis, Photosensitivity, Rash, Other (See Comments)   Severe headache        Medication List        Accurate as of February 19, 2024  7:01 AM. If you have any questions, ask your nurse or doctor.          acetaminophen  325 MG tablet Commonly known as: TYLENOL  Take 2 tablets (650 mg total) by mouth every 6 (six) hours as needed for mild pain (pain score 1-3), headache or moderate pain (pain score 4-6).   ALPRAZolam  0.25 MG tablet Commonly known as: XANAX  Take 1 tablet (0.25 mg total) by mouth 3 (three) times daily as needed.   amoxicillin  500 MG capsule Commonly known as: AMOXIL  take 2 capsules (500mg ) to start then take one capsule 3 times daily until gone   Blood Glucose Monitor System w/Device Kit use as drected to check blood sugar   DULoxetine  60 MG capsule Commonly known as: CYMBALTA  Take 1 capsule (60 mg total) by mouth daily.   estradiol  0.075 MG/24HR Commonly known as: VIVELLE -DOT Place 1 patch onto the skin 2 (two) times a week.   estradiol  0.1 MG/GM vaginal cream Commonly known as: ESTRACE  VAGINAL Place 1 g vaginally 3 (three) times a week.   FeroSul 325 (65 Fe) MG tablet Generic drug: ferrous sulfate  Take 1 tablet (325 mg total) by mouth 2 (two) times daily with a meal.   fluconazole  150 MG tablet Commonly known as: Diflucan  Take 1 tablet (150 mg total) by mouth daily. May repeat in 3 days if needed.   FreeStyle Libre 3 Plus Sensor Misc Use to continuously  monitor blood glucose, changing the sensor every 15 days.   FREESTYLE LITE test strip Generic drug: glucose blood Use as instructed to check blood sugar 2 times daily   loratadine  10 MG tablet Commonly known as: CLARITIN  Take 1 tablet (10 mg total) by mouth daily.   losartan  25 MG tablet Commonly known as: COZAAR  Take 1 tablet (25 mg total) by mouth daily.   metoprolol  succinate 50 MG 24 hr tablet Commonly known as:  TOPROL -XL Take 1 tablet (50 mg total) by mouth daily. Take with or immediately following a meal.   nystatin  powder Generic drug: nystatin  Apply topically 2 (two) times daily.   progesterone  100 MG capsule Commonly known as: PROMETRIUM  Take 1 capsule (100 mg total) by mouth at bedtime.   rosuvastatin  40 MG tablet Commonly known as: CRESTOR  Take 1 tablet (40 mg total) by mouth daily.   sodium bicarbonate  650 MG tablet Take 1 tablet by mouth daily   Trulicity  3 MG/0.5ML Soaj Generic drug: Dulaglutide  Inject 3 mg as directed once a week.   Vitamin D  (Ergocalciferol ) 1.25 MG (50000 UNIT) Caps capsule Commonly known as: DRISDOL  Take 1 capsule (50,000 Units total) by mouth every 7 (seven) days.   Womens Multivitamin Gummies Chew Chew 2 each by mouth daily.         OBJECTIVE:   Vital Signs: LMP 05/22/2019   Wt Readings from Last 3 Encounters:  09/09/23 189 lb (85.7 kg)  08/26/23 186 lb (84.4 kg)  08/25/23 188 lb 3.2 oz (85.4 kg)     Exam: General: Pt appears well and is in NAD  Lungs: Clear with good BS bilat   Heart: RRR  Abdomen:  soft, nontender  Extremities: No pretibial edema.   Neuro: MS is good with appropriate affect, pt is alert and Ox3    DM foot exam: 08/21/2023   The skin of the feet is without sores or ulcerations The pedal pulses are 2+ on right and 2+ on left. The sensation is intact to a screening 5.07, 10 gram monofilament bilaterally    DATA REVIEWED:  Lab Results  Component Value Date   HGBA1C 5.3 08/21/2023    HGBA1C 6.3 (A) 05/21/2023   HGBA1C 5.5 01/08/2023     Latest Reference Range & Units 08/21/23 08:08  Sodium 135 - 146 mmol/L 139  Potassium 3.5 - 5.3 mmol/L 4.1  Chloride 98 - 110 mmol/L 105  CO2 20 - 32 mmol/L 27  Glucose 65 - 99 mg/dL 69  BUN 7 - 25 mg/dL 16  Creatinine 9.49 - 8.96 mg/dL 8.89 (H)  Calcium  8.6 - 10.4 mg/dL 9.9  BUN/Creatinine Ratio 6 - 22 (calc) 15  eGFR > OR = 60 mL/min/1.54m2 61     Latest Reference Range & Units 08/21/23 08:08  Total CHOL/HDL Ratio <5.0 (calc) 2.6  Cholesterol <200 mg/dL 825  HDL Cholesterol > OR = 50 mg/dL 68  LDL Cholesterol (Calc) mg/dL (calc) 89  Non-HDL Cholesterol (Calc) <130 mg/dL (calc) 893  Triglycerides <150 mg/dL 80     Old records , labs and images have been reviewed.   ASSESSMENT / PLAN / RECOMMENDATIONS:    1) Type 2 Diabetes Mellitus, Optimally controlled,  Without complications - Most recent A1c of 5.3 %. Goal A1c < 7.0 %.    -A1c continues to trend down  -Metformin  had to be discontinued during hospitalization for AKI 04/2023, and I started her on glimepiride  to prevent hyperglycemia -Her A1c is low at 5.3%, and known hypoglycemic episode as she has not been checking glucose, unable to recall glucose readings while on freestyle libre, she did develop pruritus.  I have asked her to apply Flonase  to the skin prior to freestyle libre sensor application and see how she does with that, if not, the patient was encouraged to check glucose twice daily before breakfast and before supper.  As her fasting glucose is elevated at 180 Mg/DL - I did explain to the patient that her fasting glucose  is too high for the level of A1c, and due to lack of glucose data I am going to assume that her BGs are lower and we will discontinue glimepiride  until further data is available - GFR has improved     MEDICATIONS: - Stop glimepiride  1 mg daily -Continue Trulicity  3 mg weekly    EDUCATION / INSTRUCTIONS: BG monitoring instructions: Patient  is instructed to check her blood sugars 3 times a week Call [facility] if: BG persistently < 70  I reviewed the Rule of 15 for the treatment of hypoglycemia in detail with the patient. Literature supplied.   2) Diabetic complications:  Eye: Does not have known diabetic retinopathy. Pt urged to have an updated eye exam  Neuro/ Feet: Does not have known diabetic peripheral neuropathy. Renal: Patient does not have known baseline CKD. She is on an ACEI/ARB at present.   3) Dyslipidemia :  - She has been compliant with rosuvastatin  intake - Lipid panel at goal   Medication   Continue Rosuvastatin  40 mg daily       F/U in 6 months    Signed electronically by: Stefano Redgie Butts, MD  [facility]  [facility] [address] [place] [place], [place] [address] Phone: [phone] FAX: [phone]   CC: Cheryl Cyndee Jamee JONELLE, DO [address] [place] [address] Phone: [phone]  Fax: [phone]  Return to Endocrinology clinic as below: Future Appointments  Date Time Provider Department Center  02/19/2024  7:30 AM Aleks Nawrot, Donell Redgie, MD [id number] None  08/26/2024  1:30 PM Chrzanowski, Jami B, NP [facility] None

## 2024-02-25 ENCOUNTER — Ambulatory Visit: Admitting: Family Medicine

## 2024-08-26 ENCOUNTER — Ambulatory Visit: Admitting: Radiology
